# Patient Record
Sex: Male | Born: 1935 | Race: White | Hispanic: No | Smoking: Former smoker
Health system: Southern US, Community
[De-identification: ages and names within clinical notes are randomized; demographics above are authoritative.]

## PROBLEM LIST (undated history)

## (undated) DIAGNOSIS — D696 Thrombocytopenia, unspecified: Secondary | ICD-10-CM

## (undated) DIAGNOSIS — J1282 Pneumonia due to coronavirus disease 2019: Secondary | ICD-10-CM

## (undated) DIAGNOSIS — I2510 Atherosclerotic heart disease of native coronary artery without angina pectoris: Secondary | ICD-10-CM

## (undated) DIAGNOSIS — I10 Essential (primary) hypertension: Secondary | ICD-10-CM

## (undated) DIAGNOSIS — I2129 ST elevation (STEMI) myocardial infarction involving other sites: Secondary | ICD-10-CM

## (undated) DIAGNOSIS — L719 Rosacea, unspecified: Secondary | ICD-10-CM

## (undated) DIAGNOSIS — N183 Chronic kidney disease, stage 3 unspecified: Secondary | ICD-10-CM

## (undated) DIAGNOSIS — R296 Repeated falls: Secondary | ICD-10-CM

## (undated) DIAGNOSIS — E782 Mixed hyperlipidemia: Secondary | ICD-10-CM

## (undated) DIAGNOSIS — N200 Calculus of kidney: Secondary | ICD-10-CM

## (undated) DIAGNOSIS — U071 COVID-19: Secondary | ICD-10-CM

## (undated) DIAGNOSIS — G4733 Obstructive sleep apnea (adult) (pediatric): Secondary | ICD-10-CM

## (undated) DIAGNOSIS — I77810 Thoracic aortic ectasia: Secondary | ICD-10-CM

## (undated) DIAGNOSIS — I255 Ischemic cardiomyopathy: Secondary | ICD-10-CM

## (undated) HISTORY — PX: TOTAL KNEE ARTHROPLASTY: SHX125

## (undated) HISTORY — DX: Obstructive sleep apnea (adult) (pediatric): G47.33

## (undated) HISTORY — DX: Pneumonia due to coronavirus disease 2019: J12.82

## (undated) HISTORY — DX: Ischemic cardiomyopathy: I25.5

## (undated) HISTORY — DX: Thoracic aortic ectasia: I77.810

## (undated) HISTORY — DX: Chronic kidney disease, stage 3 unspecified: N18.30

## (undated) HISTORY — PX: MELANOMA EXCISION: SHX5266

## (undated) HISTORY — DX: Calculus of kidney: N20.0

## (undated) HISTORY — DX: Essential (primary) hypertension: I10

## (undated) HISTORY — DX: Thrombocytopenia, unspecified: D69.6

## (undated) HISTORY — DX: COVID-19: U07.1

## (undated) HISTORY — DX: Mixed hyperlipidemia: E78.2

## (undated) HISTORY — DX: Chronic kidney disease, stage 3 (moderate): N18.3

## (undated) HISTORY — PX: CHOLECYSTECTOMY: SHX55

## (undated) HISTORY — DX: Atherosclerotic heart disease of native coronary artery without angina pectoris: I25.10

## (undated) HISTORY — PX: OTHER SURGICAL HISTORY: SHX169

## (undated) HISTORY — PX: HERNIA REPAIR: SHX51

## (undated) HISTORY — DX: Rosacea, unspecified: L71.9

---

## 1990-07-05 HISTORY — PX: CORONARY ARTERY BYPASS GRAFT: SHX141

## 1990-07-11 HISTORY — PX: CARDIAC CATHETERIZATION: SHX172

## 2001-10-24 ENCOUNTER — Ambulatory Visit: Admission: RE | Admit: 2001-10-24 | Discharge: 2001-10-24 | Payer: Self-pay | Admitting: *Deleted

## 2001-12-26 ENCOUNTER — Ambulatory Visit: Admission: RE | Admit: 2001-12-26 | Discharge: 2001-12-26 | Payer: Self-pay | Admitting: Pulmonary Disease

## 2006-01-24 ENCOUNTER — Ambulatory Visit (HOSPITAL_COMMUNITY): Admission: RE | Admit: 2006-01-24 | Discharge: 2006-01-24 | Payer: Self-pay | Admitting: General Surgery

## 2006-02-09 ENCOUNTER — Encounter (INDEPENDENT_AMBULATORY_CARE_PROVIDER_SITE_OTHER): Payer: Self-pay | Admitting: Specialist

## 2006-02-09 ENCOUNTER — Inpatient Hospital Stay (HOSPITAL_COMMUNITY): Admission: RE | Admit: 2006-02-09 | Discharge: 2006-02-13 | Payer: Self-pay | Admitting: Otolaryngology

## 2006-02-28 ENCOUNTER — Ambulatory Visit: Payer: Self-pay | Admitting: Oncology

## 2006-03-03 LAB — CBC WITH DIFFERENTIAL/PLATELET
BASO%: 0.4 % (ref 0.0–2.0)
Basophils Absolute: 0 10*3/uL (ref 0.0–0.1)
EOS%: 3.7 % (ref 0.0–7.0)
Eosinophils Absolute: 0.3 10*3/uL (ref 0.0–0.5)
HCT: 46.1 % (ref 38.7–49.9)
HGB: 16.4 g/dL (ref 13.0–17.1)
LYMPH%: 36.2 % (ref 14.0–48.0)
MCH: 32.2 pg (ref 28.0–33.4)
MCHC: 35.7 g/dL (ref 32.0–35.9)
MCV: 90.3 fL (ref 81.6–98.0)
MONO#: 0.4 10*3/uL (ref 0.1–0.9)
MONO%: 6.1 % (ref 0.0–13.0)
NEUT#: 3.7 10*3/uL (ref 1.5–6.5)
NEUT%: 53.6 % (ref 40.0–75.0)
Platelets: 143 10*3/uL — ABNORMAL LOW (ref 145–400)
RBC: 5.11 10*6/uL (ref 4.20–5.71)
RDW: 12.7 % (ref 11.2–14.6)
WBC: 6.9 10*3/uL (ref 4.0–10.0)
lymph#: 2.5 10*3/uL (ref 0.9–3.3)

## 2006-03-03 LAB — COMPREHENSIVE METABOLIC PANEL
ALT: 22 U/L (ref 0–40)
AST: 20 U/L (ref 0–37)
Albumin: 4.5 g/dL (ref 3.5–5.2)
Alkaline Phosphatase: 41 U/L (ref 39–117)
BUN: 18 mg/dL (ref 6–23)
CO2: 24 mEq/L (ref 19–32)
Calcium: 9.5 mg/dL (ref 8.4–10.5)
Chloride: 106 mEq/L (ref 96–112)
Creatinine, Ser: 1.28 mg/dL (ref 0.40–1.50)
Glucose, Bld: 97 mg/dL (ref 70–99)
Potassium: 4 mEq/L (ref 3.5–5.3)
Sodium: 140 mEq/L (ref 135–145)
Total Bilirubin: 2 mg/dL — ABNORMAL HIGH (ref 0.3–1.2)
Total Protein: 7.1 g/dL (ref 6.0–8.3)

## 2006-03-03 LAB — LACTATE DEHYDROGENASE: LDH: 166 U/L (ref 94–250)

## 2006-09-05 ENCOUNTER — Ambulatory Visit: Payer: Self-pay | Admitting: Oncology

## 2006-09-07 ENCOUNTER — Ambulatory Visit (HOSPITAL_COMMUNITY): Admission: RE | Admit: 2006-09-07 | Discharge: 2006-09-07 | Payer: Self-pay | Admitting: Oncology

## 2006-09-07 LAB — COMPREHENSIVE METABOLIC PANEL
ALT: 31 U/L (ref 0–53)
AST: 29 U/L (ref 0–37)
Albumin: 4.5 g/dL (ref 3.5–5.2)
Alkaline Phosphatase: 46 U/L (ref 39–117)
BUN: 18 mg/dL (ref 6–23)
CO2: 25 mEq/L (ref 19–32)
Calcium: 9.8 mg/dL (ref 8.4–10.5)
Chloride: 105 mEq/L (ref 96–112)
Creatinine, Ser: 1.13 mg/dL (ref 0.40–1.50)
Glucose, Bld: 90 mg/dL (ref 70–99)
Potassium: 3.9 mEq/L (ref 3.5–5.3)
Sodium: 141 mEq/L (ref 135–145)
Total Bilirubin: 1.7 mg/dL — ABNORMAL HIGH (ref 0.3–1.2)
Total Protein: 7.4 g/dL (ref 6.0–8.3)

## 2006-09-07 LAB — CBC WITH DIFFERENTIAL/PLATELET
BASO%: 0.3 % (ref 0.0–2.0)
Basophils Absolute: 0 10*3/uL (ref 0.0–0.1)
EOS%: 3.1 % (ref 0.0–7.0)
Eosinophils Absolute: 0.2 10*3/uL (ref 0.0–0.5)
HCT: 46.4 % (ref 38.7–49.9)
HGB: 16.8 g/dL (ref 13.0–17.1)
LYMPH%: 28.6 % (ref 14.0–48.0)
MCH: 31.3 pg (ref 28.0–33.4)
MCHC: 36.2 g/dL — ABNORMAL HIGH (ref 32.0–35.9)
MCV: 86.7 fL (ref 81.6–98.0)
MONO#: 0.5 10*3/uL (ref 0.1–0.9)
MONO%: 7 % (ref 0.0–13.0)
NEUT#: 4.2 10*3/uL (ref 1.5–6.5)
NEUT%: 61 % (ref 40.0–75.0)
Platelets: 181 10*3/uL (ref 145–400)
RBC: 5.35 10*6/uL (ref 4.20–5.71)
RDW: 12.8 % (ref 11.2–14.6)
WBC: 7 10*3/uL (ref 4.0–10.0)
lymph#: 2 10*3/uL (ref 0.9–3.3)

## 2006-09-07 LAB — LACTATE DEHYDROGENASE: LDH: 173 U/L (ref 94–250)

## 2007-01-10 ENCOUNTER — Ambulatory Visit: Payer: Self-pay | Admitting: Oncology

## 2007-01-12 LAB — COMPREHENSIVE METABOLIC PANEL
ALT: 25 U/L (ref 0–53)
AST: 23 U/L (ref 0–37)
Albumin: 4.4 g/dL (ref 3.5–5.2)
Alkaline Phosphatase: 40 U/L (ref 39–117)
BUN: 16 mg/dL (ref 6–23)
CO2: 24 mEq/L (ref 19–32)
Calcium: 9.4 mg/dL (ref 8.4–10.5)
Chloride: 106 mEq/L (ref 96–112)
Creatinine, Ser: 1.25 mg/dL (ref 0.40–1.50)
Glucose, Bld: 96 mg/dL (ref 70–99)
Potassium: 3.9 mEq/L (ref 3.5–5.3)
Sodium: 140 mEq/L (ref 135–145)
Total Bilirubin: 2.6 mg/dL — ABNORMAL HIGH (ref 0.3–1.2)
Total Protein: 6.9 g/dL (ref 6.0–8.3)

## 2007-01-12 LAB — CBC WITH DIFFERENTIAL/PLATELET
BASO%: 0.5 % (ref 0.0–2.0)
Basophils Absolute: 0 10*3/uL (ref 0.0–0.1)
EOS%: 3.7 % (ref 0.0–7.0)
Eosinophils Absolute: 0.2 10*3/uL (ref 0.0–0.5)
HCT: UNDETERMINED % (ref 38.7–49.9)
HGB: 16.6 g/dL (ref 13.0–17.1)
LYMPH%: 32.4 % (ref 14.0–48.0)
MCH: UNDETERMINED pg (ref 28.0–33.4)
MCHC: UNDETERMINED g/dL (ref 32.0–35.9)
MCV: UNDETERMINED fL (ref 81.6–98.0)
MONO#: 0.3 10*3/uL (ref 0.1–0.9)
MONO%: 5.6 % (ref 0.0–13.0)
NEUT#: 3 10*3/uL (ref 1.5–6.5)
NEUT%: 57.8 % (ref 40.0–75.0)
Platelets: 120 10*3/uL — ABNORMAL LOW (ref 145–400)
RBC: UNDETERMINED 10*6/uL (ref 4.20–5.71)
RDW: 12.7 % (ref 11.2–14.6)
WBC: 5.2 10*3/uL (ref 4.0–10.0)
lymph#: 1.7 10*3/uL (ref 0.9–3.3)

## 2007-01-12 LAB — LACTATE DEHYDROGENASE: LDH: 151 U/L (ref 94–250)

## 2007-05-23 HISTORY — PX: DOPPLER ECHOCARDIOGRAPHY: SHX263

## 2007-07-11 ENCOUNTER — Ambulatory Visit: Payer: Self-pay | Admitting: Oncology

## 2007-07-13 ENCOUNTER — Ambulatory Visit (HOSPITAL_COMMUNITY): Admission: RE | Admit: 2007-07-13 | Discharge: 2007-07-13 | Payer: Self-pay | Admitting: Oncology

## 2007-07-13 LAB — COMPREHENSIVE METABOLIC PANEL
ALT: 22 U/L (ref 0–53)
AST: 20 U/L (ref 0–37)
Albumin: 4.4 g/dL (ref 3.5–5.2)
Alkaline Phosphatase: 46 U/L (ref 39–117)
BUN: 18 mg/dL (ref 6–23)
CO2: 26 mEq/L (ref 19–32)
Calcium: 9.4 mg/dL (ref 8.4–10.5)
Chloride: 105 mEq/L (ref 96–112)
Creatinine, Ser: 1.16 mg/dL (ref 0.40–1.50)
Glucose, Bld: 95 mg/dL (ref 70–99)
Potassium: 4.2 mEq/L (ref 3.5–5.3)
Sodium: 141 mEq/L (ref 135–145)
Total Bilirubin: 2.2 mg/dL — ABNORMAL HIGH (ref 0.3–1.2)
Total Protein: 7.3 g/dL (ref 6.0–8.3)

## 2007-07-13 LAB — CBC WITH DIFFERENTIAL/PLATELET
BASO%: 0.4 % (ref 0.0–2.0)
Basophils Absolute: 0 10*3/uL (ref 0.0–0.1)
EOS%: 4 % (ref 0.0–7.0)
Eosinophils Absolute: 0.3 10*3/uL (ref 0.0–0.5)
HCT: 46.8 % (ref 38.7–49.9)
HGB: 16.8 g/dL (ref 13.0–17.1)
LYMPH%: 34.9 % (ref 14.0–48.0)
MCH: 31.6 pg (ref 28.0–33.4)
MCHC: 35.8 g/dL (ref 32.0–35.9)
MCV: 88.4 fL (ref 81.6–98.0)
MONO#: 0.4 10*3/uL (ref 0.1–0.9)
MONO%: 6.4 % (ref 0.0–13.0)
NEUT#: 3.5 10*3/uL (ref 1.5–6.5)
NEUT%: 54.3 % (ref 40.0–75.0)
Platelets: 135 10*3/uL — ABNORMAL LOW (ref 145–400)
RBC: 5.3 10*6/uL (ref 4.20–5.71)
RDW: 13 % (ref 11.2–14.6)
WBC: 6.4 10*3/uL (ref 4.0–10.0)
lymph#: 2.2 10*3/uL (ref 0.9–3.3)

## 2007-07-13 LAB — LACTATE DEHYDROGENASE: LDH: 146 U/L (ref 94–250)

## 2007-12-11 ENCOUNTER — Ambulatory Visit: Payer: Self-pay | Admitting: Oncology

## 2007-12-13 LAB — COMPREHENSIVE METABOLIC PANEL
ALT: 19 U/L (ref 0–53)
AST: 22 U/L (ref 0–37)
Albumin: 4.4 g/dL (ref 3.5–5.2)
Alkaline Phosphatase: 45 U/L (ref 39–117)
BUN: 19 mg/dL (ref 6–23)
CO2: 24 mEq/L (ref 19–32)
Calcium: 9.1 mg/dL (ref 8.4–10.5)
Chloride: 105 mEq/L (ref 96–112)
Creatinine, Ser: 1.14 mg/dL (ref 0.40–1.50)
Glucose, Bld: 103 mg/dL — ABNORMAL HIGH (ref 70–99)
Potassium: 3.5 mEq/L (ref 3.5–5.3)
Sodium: 140 mEq/L (ref 135–145)
Total Bilirubin: 2.6 mg/dL — ABNORMAL HIGH (ref 0.3–1.2)
Total Protein: 7.1 g/dL (ref 6.0–8.3)

## 2007-12-13 LAB — CBC WITH DIFFERENTIAL/PLATELET
BASO%: 0.4 % (ref 0.0–2.0)
Basophils Absolute: 0 10*3/uL (ref 0.0–0.1)
EOS%: 2.2 % (ref 0.0–7.0)
Eosinophils Absolute: 0.1 10*3/uL (ref 0.0–0.5)
HCT: 47 % (ref 38.7–49.9)
HGB: 16.8 g/dL (ref 13.0–17.1)
LYMPH%: 40 % (ref 14.0–48.0)
MCH: 31.5 pg (ref 28.0–33.4)
MCHC: 35.7 g/dL (ref 32.0–35.9)
MCV: 88.2 fL (ref 81.6–98.0)
MONO#: 0.3 10*3/uL (ref 0.1–0.9)
MONO%: 4.9 % (ref 0.0–13.0)
NEUT#: 3 10*3/uL (ref 1.5–6.5)
NEUT%: 52.5 % (ref 40.0–75.0)
Platelets: 118 10*3/uL — ABNORMAL LOW (ref 145–400)
RBC: 5.33 10*6/uL (ref 4.20–5.71)
RDW: 12.8 % (ref 11.2–14.6)
WBC: 5.7 10*3/uL (ref 4.0–10.0)
lymph#: 2.3 10*3/uL (ref 0.9–3.3)

## 2007-12-13 LAB — LACTATE DEHYDROGENASE: LDH: 146 U/L (ref 94–250)

## 2008-05-15 ENCOUNTER — Ambulatory Visit (HOSPITAL_COMMUNITY): Admission: RE | Admit: 2008-05-15 | Discharge: 2008-05-15 | Payer: Self-pay | Admitting: Pulmonary Disease

## 2008-07-04 ENCOUNTER — Ambulatory Visit: Payer: Self-pay | Admitting: Oncology

## 2008-07-09 LAB — CBC WITH DIFFERENTIAL/PLATELET
BASO%: 0.3 % (ref 0.0–2.0)
Basophils Absolute: 0 10*3/uL (ref 0.0–0.1)
EOS%: 4.6 % (ref 0.0–7.0)
Eosinophils Absolute: 0.3 10*3/uL (ref 0.0–0.5)
HCT: 47.9 % (ref 38.7–49.9)
HGB: 16.9 g/dL (ref 13.0–17.1)
LYMPH%: 38.2 % (ref 14.0–48.0)
MCH: 31.4 pg (ref 28.0–33.4)
MCHC: 35.2 g/dL (ref 32.0–35.9)
MCV: 89.1 fL (ref 81.6–98.0)
MONO#: 0.4 10*3/uL (ref 0.1–0.9)
MONO%: 6.3 % (ref 0.0–13.0)
NEUT#: 2.9 10*3/uL (ref 1.5–6.5)
NEUT%: 50.6 % (ref 40.0–75.0)
Platelets: 115 10*3/uL — ABNORMAL LOW (ref 145–400)
RBC: 5.38 10*6/uL (ref 4.20–5.71)
RDW: 13 % (ref 11.2–14.6)
WBC: 5.7 10*3/uL (ref 4.0–10.0)
lymph#: 2.2 10*3/uL (ref 0.9–3.3)

## 2008-07-09 LAB — COMPREHENSIVE METABOLIC PANEL
ALT: 27 U/L (ref 0–53)
AST: 23 U/L (ref 0–37)
Albumin: 4.4 g/dL (ref 3.5–5.2)
Alkaline Phosphatase: 45 U/L (ref 39–117)
BUN: 16 mg/dL (ref 6–23)
CO2: 28 mEq/L (ref 19–32)
Calcium: 8.9 mg/dL (ref 8.4–10.5)
Chloride: 103 mEq/L (ref 96–112)
Creatinine, Ser: 1.18 mg/dL (ref 0.40–1.50)
Glucose, Bld: 90 mg/dL (ref 70–99)
Potassium: 3.8 mEq/L (ref 3.5–5.3)
Sodium: 140 mEq/L (ref 135–145)
Total Bilirubin: 2.5 mg/dL — ABNORMAL HIGH (ref 0.3–1.2)
Total Protein: 6.9 g/dL (ref 6.0–8.3)

## 2008-07-09 LAB — LACTATE DEHYDROGENASE: LDH: 155 U/L (ref 94–250)

## 2009-01-01 ENCOUNTER — Ambulatory Visit: Payer: Self-pay | Admitting: Oncology

## 2009-01-07 LAB — CBC WITH DIFFERENTIAL/PLATELET
BASO%: 0.3 % (ref 0.0–2.0)
Basophils Absolute: 0 10*3/uL (ref 0.0–0.1)
EOS%: 2.4 % (ref 0.0–7.0)
Eosinophils Absolute: 0.1 10*3/uL (ref 0.0–0.5)
HCT: 44.7 % (ref 38.4–49.9)
HGB: 15.8 g/dL (ref 13.0–17.1)
LYMPH%: 32.6 % (ref 14.0–49.0)
MCH: 31.4 pg (ref 27.2–33.4)
MCHC: 35.3 g/dL (ref 32.0–36.0)
MCV: 88.9 fL (ref 79.3–98.0)
MONO#: 0.4 10*3/uL (ref 0.1–0.9)
MONO%: 6.7 % (ref 0.0–14.0)
NEUT#: 3.3 10*3/uL (ref 1.5–6.5)
NEUT%: 58 % (ref 39.0–75.0)
Platelets: 108 10*3/uL — ABNORMAL LOW (ref 140–400)
RBC: 5.02 10*6/uL (ref 4.20–5.82)
RDW: 13.1 % (ref 11.0–14.6)
WBC: 5.6 10*3/uL (ref 4.0–10.3)
lymph#: 1.8 10*3/uL (ref 0.9–3.3)

## 2009-01-07 LAB — COMPREHENSIVE METABOLIC PANEL
ALT: 25 U/L (ref 0–53)
AST: 18 U/L (ref 0–37)
Albumin: 4.3 g/dL (ref 3.5–5.2)
Alkaline Phosphatase: 42 U/L (ref 39–117)
BUN: 15 mg/dL (ref 6–23)
CO2: 22 mEq/L (ref 19–32)
Calcium: 9.5 mg/dL (ref 8.4–10.5)
Chloride: 105 mEq/L (ref 96–112)
Creatinine, Ser: 1.07 mg/dL (ref 0.40–1.50)
Glucose, Bld: 102 mg/dL — ABNORMAL HIGH (ref 70–99)
Potassium: 4 mEq/L (ref 3.5–5.3)
Sodium: 139 mEq/L (ref 135–145)
Total Bilirubin: 1.9 mg/dL — ABNORMAL HIGH (ref 0.3–1.2)
Total Protein: 6.8 g/dL (ref 6.0–8.3)

## 2009-01-07 LAB — LACTATE DEHYDROGENASE: LDH: 143 U/L (ref 94–250)

## 2009-03-04 ENCOUNTER — Ambulatory Visit: Payer: Self-pay | Admitting: Orthopedic Surgery

## 2009-03-04 DIAGNOSIS — IMO0002 Reserved for concepts with insufficient information to code with codable children: Secondary | ICD-10-CM | POA: Insufficient documentation

## 2009-03-04 DIAGNOSIS — M1710 Unilateral primary osteoarthritis, unspecified knee: Secondary | ICD-10-CM

## 2009-03-05 ENCOUNTER — Telehealth: Payer: Self-pay | Admitting: Orthopedic Surgery

## 2009-04-01 HISTORY — PX: OTHER SURGICAL HISTORY: SHX169

## 2009-07-07 ENCOUNTER — Ambulatory Visit: Payer: Self-pay | Admitting: Oncology

## 2009-07-10 ENCOUNTER — Ambulatory Visit (HOSPITAL_COMMUNITY): Admission: RE | Admit: 2009-07-10 | Discharge: 2009-07-10 | Payer: Self-pay | Admitting: Oncology

## 2009-07-10 LAB — COMPREHENSIVE METABOLIC PANEL
ALT: 37 U/L (ref 0–53)
AST: 30 U/L (ref 0–37)
Albumin: 4.4 g/dL (ref 3.5–5.2)
Alkaline Phosphatase: 45 U/L (ref 39–117)
BUN: 17 mg/dL (ref 6–23)
CO2: 25 mEq/L (ref 19–32)
Calcium: 9.2 mg/dL (ref 8.4–10.5)
Chloride: 105 mEq/L (ref 96–112)
Creatinine, Ser: 1.13 mg/dL (ref 0.40–1.50)
Glucose, Bld: 109 mg/dL — ABNORMAL HIGH (ref 70–99)
Potassium: 3.6 mEq/L (ref 3.5–5.3)
Sodium: 141 mEq/L (ref 135–145)
Total Bilirubin: 2.1 mg/dL — ABNORMAL HIGH (ref 0.3–1.2)
Total Protein: 6.8 g/dL (ref 6.0–8.3)

## 2009-07-10 LAB — CBC WITH DIFFERENTIAL/PLATELET
BASO%: 0.3 % (ref 0.0–2.0)
Basophils Absolute: 0 10*3/uL (ref 0.0–0.1)
EOS%: 3.9 % (ref 0.0–7.0)
Eosinophils Absolute: 0.2 10*3/uL (ref 0.0–0.5)
HCT: 46.8 % (ref 38.4–49.9)
HGB: 16.6 g/dL (ref 13.0–17.1)
LYMPH%: 35.5 % (ref 14.0–49.0)
MCH: 32.3 pg (ref 27.2–33.4)
MCHC: 35.4 g/dL (ref 32.0–36.0)
MCV: 91.2 fL (ref 79.3–98.0)
MONO#: 0.4 10*3/uL (ref 0.1–0.9)
MONO%: 5.7 % (ref 0.0–14.0)
NEUT#: 3.5 10*3/uL (ref 1.5–6.5)
NEUT%: 54.6 % (ref 39.0–75.0)
Platelets: 120 10*3/uL — ABNORMAL LOW (ref 140–400)
RBC: 5.14 10*6/uL (ref 4.20–5.82)
RDW: 12.6 % (ref 11.0–14.6)
WBC: 6.3 10*3/uL (ref 4.0–10.3)
lymph#: 2.2 10*3/uL (ref 0.9–3.3)

## 2009-07-10 LAB — LACTATE DEHYDROGENASE: LDH: 166 U/L (ref 94–250)

## 2009-12-02 ENCOUNTER — Ambulatory Visit: Payer: Self-pay | Admitting: Oncology

## 2009-12-03 ENCOUNTER — Ambulatory Visit (HOSPITAL_COMMUNITY): Admission: RE | Admit: 2009-12-03 | Discharge: 2009-12-03 | Payer: Self-pay | Admitting: Oncology

## 2009-12-03 LAB — COMPREHENSIVE METABOLIC PANEL
ALT: 31 U/L (ref 0–53)
AST: 26 U/L (ref 0–37)
Albumin: 4.2 g/dL (ref 3.5–5.2)
Alkaline Phosphatase: 37 U/L — ABNORMAL LOW (ref 39–117)
BUN: 17 mg/dL (ref 6–23)
CO2: 24 mEq/L (ref 19–32)
Calcium: 9.3 mg/dL (ref 8.4–10.5)
Chloride: 105 mEq/L (ref 96–112)
Creatinine, Ser: 1.27 mg/dL (ref 0.40–1.50)
Glucose, Bld: 100 mg/dL — ABNORMAL HIGH (ref 70–99)
Potassium: 4 mEq/L (ref 3.5–5.3)
Sodium: 141 mEq/L (ref 135–145)
Total Bilirubin: 2.1 mg/dL — ABNORMAL HIGH (ref 0.3–1.2)
Total Protein: 6.4 g/dL (ref 6.0–8.3)

## 2009-12-03 LAB — CBC WITH DIFFERENTIAL/PLATELET
BASO%: 0.4 % (ref 0.0–2.0)
Basophils Absolute: 0 10*3/uL (ref 0.0–0.1)
EOS%: 5.4 % (ref 0.0–7.0)
Eosinophils Absolute: 0.3 10*3/uL (ref 0.0–0.5)
HCT: 44 % (ref 38.4–49.9)
HGB: 15.7 g/dL (ref 13.0–17.1)
LYMPH%: 37.8 % (ref 14.0–49.0)
MCH: 32.5 pg (ref 27.2–33.4)
MCHC: 35.8 g/dL (ref 32.0–36.0)
MCV: 90.8 fL (ref 79.3–98.0)
MONO#: 0.4 10*3/uL (ref 0.1–0.9)
MONO%: 6.3 % (ref 0.0–14.0)
NEUT#: 3 10*3/uL (ref 1.5–6.5)
NEUT%: 50.1 % (ref 39.0–75.0)
Platelets: 103 10*3/uL — ABNORMAL LOW (ref 140–400)
RBC: 4.84 10*6/uL (ref 4.20–5.82)
RDW: 12.9 % (ref 11.0–14.6)
WBC: 6 10*3/uL (ref 4.0–10.3)
lymph#: 2.3 10*3/uL (ref 0.9–3.3)

## 2009-12-03 LAB — LACTATE DEHYDROGENASE: LDH: 148 U/L (ref 94–250)

## 2009-12-08 ENCOUNTER — Inpatient Hospital Stay (HOSPITAL_COMMUNITY): Admission: RE | Admit: 2009-12-08 | Discharge: 2009-12-11 | Payer: Self-pay | Admitting: Orthopedic Surgery

## 2010-01-26 ENCOUNTER — Emergency Department (HOSPITAL_COMMUNITY): Admission: EM | Admit: 2010-01-26 | Discharge: 2010-01-26 | Payer: Self-pay | Admitting: Emergency Medicine

## 2010-01-29 ENCOUNTER — Ambulatory Visit (HOSPITAL_COMMUNITY): Payer: Self-pay | Admitting: Pulmonary Disease

## 2010-01-29 ENCOUNTER — Encounter (HOSPITAL_COMMUNITY)
Admission: RE | Admit: 2010-01-29 | Discharge: 2010-02-28 | Payer: Self-pay | Source: Home / Self Care | Admitting: Pulmonary Disease

## 2010-06-08 ENCOUNTER — Ambulatory Visit: Payer: Self-pay | Admitting: Oncology

## 2010-06-10 LAB — COMPREHENSIVE METABOLIC PANEL
ALT: 24 U/L (ref 0–53)
AST: 23 U/L (ref 0–37)
Albumin: 4.3 g/dL (ref 3.5–5.2)
Alkaline Phosphatase: 44 U/L (ref 39–117)
BUN: 22 mg/dL (ref 6–23)
CO2: 27 mEq/L (ref 19–32)
Calcium: 9.6 mg/dL (ref 8.4–10.5)
Chloride: 106 mEq/L (ref 96–112)
Creatinine, Ser: 1.17 mg/dL (ref 0.40–1.50)
Glucose, Bld: 95 mg/dL (ref 70–99)
Potassium: 3.9 mEq/L (ref 3.5–5.3)
Sodium: 141 mEq/L (ref 135–145)
Total Bilirubin: 2.2 mg/dL — ABNORMAL HIGH (ref 0.3–1.2)
Total Protein: 6.7 g/dL (ref 6.0–8.3)

## 2010-06-10 LAB — CBC WITH DIFFERENTIAL/PLATELET
BASO%: 0.6 % (ref 0.0–2.0)
Basophils Absolute: 0 10*3/uL (ref 0.0–0.1)
EOS%: 3.8 % (ref 0.0–7.0)
Eosinophils Absolute: 0.2 10*3/uL (ref 0.0–0.5)
HCT: 44.9 % (ref 38.4–49.9)
HGB: 16.1 g/dL (ref 13.0–17.1)
LYMPH%: 40.6 % (ref 14.0–49.0)
MCH: 32.4 pg (ref 27.2–33.4)
MCHC: 35.8 g/dL (ref 32.0–36.0)
MCV: 90.5 fL (ref 79.3–98.0)
MONO#: 0.4 10*3/uL (ref 0.1–0.9)
MONO%: 6.4 % (ref 0.0–14.0)
NEUT#: 2.8 10*3/uL (ref 1.5–6.5)
NEUT%: 48.6 % (ref 39.0–75.0)
Platelets: 95 10*3/uL — ABNORMAL LOW (ref 140–400)
RBC: 4.97 10*6/uL (ref 4.20–5.82)
RDW: 13.9 % (ref 11.0–14.6)
WBC: 5.7 10*3/uL (ref 4.0–10.3)
lymph#: 2.3 10*3/uL (ref 0.9–3.3)

## 2010-06-10 LAB — LACTATE DEHYDROGENASE: LDH: 154 U/L (ref 94–250)

## 2010-06-30 ENCOUNTER — Encounter (INDEPENDENT_AMBULATORY_CARE_PROVIDER_SITE_OTHER): Payer: Self-pay | Admitting: General Surgery

## 2010-06-30 ENCOUNTER — Ambulatory Visit (HOSPITAL_COMMUNITY)
Admission: RE | Admit: 2010-06-30 | Discharge: 2010-07-01 | Payer: Self-pay | Source: Home / Self Care | Attending: General Surgery | Admitting: General Surgery

## 2010-08-04 NOTE — Letter (Signed)
Summary: History form  History form   Imported By: Jacklynn Ganong 03/05/2009 16:01:55  _____________________________________________________________________  External Attachment:    Type:   Image     Comment:   External Document

## 2010-08-04 NOTE — Assessment & Plan Note (Signed)
Summary: LEFT KNEE PAIN NEEDS XR/[facility]   Vital Signs:  Patient profile:   77 year old male Weight:      202 pounds Pulse rate:   78 / minute Resp:     18 per minute  Vitals Entered By: Fuller Canada MD (March 04, 2009 3:08 PM)  Visit Type:  Initial Consult Referring Provider:  Dr. Juanetta Gosling Primary Provider:  Dr. Juanetta Gosling   CC:  left knee pain.  History of Present Illness: I saw John [name] in the office today for an initial visit.  He is a 77 years old man with the complaint of:  chief complaint:  left knee pain, no radiation, Consult [facility].  pain -duration a year , pain and stiffness with popping. No catching or locking. -location in joint -severity [1-10] 10 sometimes -worsened by: walking down incline -improved by: rest, advil.  Had cortisone shot 2 months ago, no relief, no PT or HEP. -other symptoms -xrays done & where: today in our office.   Allergies (verified): No Known Drug Allergies  Past History:  Past Medical History: htn CAD tachycardia restless leg syndrome  Past Surgical History: bypass 1992 skin cancer gallbladder  Family History: na  Social History: retired married no smoking no alcohol 1 cup of coffee per day  Review of Systems General:  Denies weight loss, weight gain, fever, chills, and fatigue. Cardiac :  Denies chest pain, angina, heart attack, heart failure, poor circulation, blood clots, and phlebitis. Resp:  Denies short of breath, difficulty breathing, COPD, cough, and pneumonia. GI:  Denies nausea, vomiting, diarrhea, constipation, difficulty swallowing, ulcers, GERD, and reflux. GU:  Complains of kidney stones; denies kidney failure, kidney transplant, burning, poor stream, testicular cancer, blood in urine, and . Neuro:  Denies headache, dizziness, migraines, numbness, weakness, tremor, and unsteady walking. MS:  Complains of joint pain; denies rheumatoid arthritis, joint swelling, gout, bone cancer,  osteoporosis, and . Endo:  Denies thyroid disease, goiter, and diabetes. Psych:  Denies depression, mood swings, anxiety, panic attack, bipolar, and schizophrenia. Derm:  Complains of cancer; denies eczema and itching. EENT:  Complains of poor hearing; denies poor vision, cataracts, glaucoma, vertigo, ears ringing, sinusitis, hoarseness, toothaches, and bleeding gums. Immunology:  Denies seasonal allergies, sinus problems, and allergic to bee stings. Lymphatic:  Denies lymph node cancer and lymph edema.  Physical Exam  Additional Exam:  GEN: well developed and nourished. normal body habitus, grooming and hygiene CDV: normal pulses perfusion color and temperature without swelling or edema Lymph: normal lymph system SKIN: normal without lesions, masses, nodules NEURO/PSYCH: Normal coordination, reflexes and sensation. awake alert and oriented  ZOX:WRUE knee  Inspection revealed tenderness over the medial compartment there is also a significant  Motor exam is normal.  Knee is stable.  There is varus deformity.  White knee evaluation form range of motion with no instability no effusion.      Impression & Recommendations:  Problem # 1:  KNEE, ARTHRITIS, DEGEN./OSTEO (ICD-715.96)  The views of the knee  The joint spaces are abnormal and significant medial joint space narrowing with bone to bone contact; and there is varus deformity   impression varus osteoarthritis  aspiration of 45 cc of clear yellow fluid.  Then injected LEFT knee.    Verbal consent was obtained. The knee was prepped with alcohol and ethyl chloride. 1 cc of depomedrol 40mg /cc and 4 cc of lidocaine 1% was injected. there were no complications.  Orders: New Patient Level IV (45409) Depo- Medrol 40mg  (J1030) Joint  Aspirate / Injection, Large (20610) Knee x-ray,  3 views (63016)  Medications Added to Medication List This Visit: 1)  Amlodipine Besy-benazepril Hcl 5-10 Mg Caps (Amlodipine besy-benazepril hcl)  2)  Metoprolol Tartrate 25 Mg Tabs (Metoprolol tartrate) 3)  Aspir-low 81 Mg Tbec (Aspirin) 4)  Ropinirole Hcl 0.25 Mg Tabs (Ropinirole hcl)  Patient Instructions: 1)  You have received an injection of cortisone today. You may experience increased pain at the injection site. Apply ice pack to the area for 20 minutes every 2 hours and take 2 xtra strength tylenol every 8 hours. This increased pain will usually resolve in 24 hours. The injection will take effect in 3-10 days.  2)  See how the shot does if not better after a month come back to discuss Knee repalcement

## 2010-08-04 NOTE — Progress Notes (Signed)
Summary: will injection cause increased heart rate?  Phone Note Call from Patient   Summary of Call: John [name] ([birth year]) wanted to know if the injection he received yesterday would cause his heart rate  to increase.  Said his increased some last night about 10:00  but was better today. Advised him to call his cardiologist and let them know about this, but I would also let you know. his # [phone] Initial call taken by: Jacklynn Ganong,  March 05, 2009 12:17 PM

## 2010-08-10 NOTE — Discharge Summary (Signed)
  John [name], John [name] NO.:  [id number]  MEDICAL RECORD NO.:  [id number]          PATIENT TYPE:  OIB  LOCATION:  A331                          FACILITY:  [facility]  PHYSICIAN:  Barbaraann Barthel, M.D. DATE OF BIRTH:  [birth year]  DATE OF ADMISSION:  06/30/2010 DATE OF DISCHARGE:  12/28/2011LH                              DISCHARGE SUMMARY   PROCEDURE:  Right inguinal hernia repair without mesh on June 30, 2010.  SECONDARY DIAGNOSES: 1. Coronary artery disease. 2. Hypertension.  Note, this is a 77 year old white male who underwent an elective repair of right inguinal hernia.  This was done after cardiac clearance.  The procedure was done on June 30, 2010, uneventfully.  I did leave a catheter in him because this was done under spinal anesthesia.  This was removed in approximately 12 hours at which time, he was able to void without any problems.  His wound was clean with minimal ecchymosis and swelling.  He had no shortness of breath, leg pain, or any signs of any infection.  He was discharged after a period of observation after his surgery on the first postoperative day.     Barbaraann Barthel, M.D.     WB/MEDQ  D:  07/01/2010  T:  07/02/2010  Job:  [id number]  cc:   Dr. Gery Pray  Electronically Signed by Barbaraann Barthel M.D. on 08/10/2010 11:08:08 AM

## 2010-09-07 ENCOUNTER — Other Ambulatory Visit (HOSPITAL_COMMUNITY): Payer: Self-pay | Admitting: General Surgery

## 2010-09-07 DIAGNOSIS — N5089 Other specified disorders of the male genital organs: Secondary | ICD-10-CM

## 2010-09-08 ENCOUNTER — Ambulatory Visit (HOSPITAL_COMMUNITY)
Admission: RE | Admit: 2010-09-08 | Discharge: 2010-09-08 | Disposition: A | Discharge status: Home / Self Care | Payer: Medicare Other | Source: Ambulatory Visit | Attending: General Surgery | Admitting: General Surgery

## 2010-09-08 ENCOUNTER — Other Ambulatory Visit (HOSPITAL_COMMUNITY): Payer: Self-pay | Admitting: General Surgery

## 2010-09-08 DIAGNOSIS — N433 Hydrocele, unspecified: Secondary | ICD-10-CM | POA: Insufficient documentation

## 2010-09-08 DIAGNOSIS — N508 Other specified disorders of male genital organs: Secondary | ICD-10-CM | POA: Insufficient documentation

## 2010-09-08 DIAGNOSIS — N5089 Other specified disorders of the male genital organs: Secondary | ICD-10-CM

## 2010-09-14 LAB — CBC
HCT: 47.3 % (ref 39.0–52.0)
Hemoglobin: 17.4 g/dL — ABNORMAL HIGH (ref 13.0–17.0)
MCH: 32.2 pg (ref 26.0–34.0)
MCHC: 36.8 g/dL — ABNORMAL HIGH (ref 30.0–36.0)
MCV: 87.4 fL (ref 78.0–100.0)
Platelets: 93 10*3/uL — ABNORMAL LOW (ref 150–400)
RBC: 5.41 MIL/uL (ref 4.22–5.81)
RDW: 13.1 % (ref 11.5–15.5)
WBC: 5.5 10*3/uL (ref 4.0–10.5)

## 2010-09-14 LAB — BASIC METABOLIC PANEL
BUN: 17 mg/dL (ref 6–23)
CO2: 26 mEq/L (ref 19–32)
Calcium: 9.2 mg/dL (ref 8.4–10.5)
Chloride: 103 mEq/L (ref 96–112)
Creatinine, Ser: 1.15 mg/dL (ref 0.4–1.5)
GFR calc Af Amer: >60 mL/min (ref >60)
GFR calc non Af Amer: >60 mL/min (ref >60)
Glucose, Bld: 100 mg/dL — ABNORMAL HIGH (ref 70–99)
Potassium: 4 mEq/L (ref 3.5–5.1)
Sodium: 138 mEq/L (ref 135–145)

## 2010-09-14 LAB — SURGICAL PCR SCREEN
MRSA, PCR: NEGATIVE
Staphylococcus aureus: NEGATIVE

## 2010-09-14 LAB — DIFFERENTIAL
Basophils Absolute: 0 10*3/uL (ref 0.0–0.1)
Basophils Relative: 0 % (ref 0–1)
Eosinophils Absolute: 0.2 10*3/uL (ref 0.0–0.7)
Eosinophils Relative: 3 % (ref 0–5)
Lymphocytes Relative: 39 % (ref 12–46)
Lymphs Abs: 2.1 10*3/uL (ref 0.7–4.0)
Monocytes Absolute: 0.3 10*3/uL (ref 0.1–1.0)
Monocytes Relative: 5 % (ref 3–12)
Neutro Abs: 2.9 10*3/uL (ref 1.7–7.7)
Neutrophils Relative %: 53 % (ref 43–77)

## 2010-09-21 LAB — PROTIME-INR
INR: 1.3 (ref 0.00–1.49)
INR: 1.3 (ref 0.00–1.49)
INR: 1.52 — ABNORMAL HIGH (ref 0.00–1.49)
INR: 1.16 (ref 0.00–1.49)
Prothrombin Time: 16.1 seconds — ABNORMAL HIGH (ref 11.6–15.2)
Prothrombin Time: 16.1 seconds — ABNORMAL HIGH (ref 11.6–15.2)
Prothrombin Time: 18.2 seconds — ABNORMAL HIGH (ref 11.6–15.2)
Prothrombin Time: 14.7 seconds (ref 11.6–15.2)

## 2010-09-21 LAB — CBC
HCT: 29.3 % — ABNORMAL LOW (ref 39.0–52.0)
HCT: 31.7 % — ABNORMAL LOW (ref 39.0–52.0)
HCT: 34.4 % — ABNORMAL LOW (ref 39.0–52.0)
HCT: 45 % (ref 39.0–52.0)
Hemoglobin: 10.3 g/dL — ABNORMAL LOW (ref 13.0–17.0)
Hemoglobin: 11.2 g/dL — ABNORMAL LOW (ref 13.0–17.0)
Hemoglobin: 12.1 g/dL — ABNORMAL LOW (ref 13.0–17.0)
Hemoglobin: 15.7 g/dL (ref 13.0–17.0)
MCHC: 35 g/dL (ref 30.0–36.0)
MCHC: 35.1 g/dL (ref 30.0–36.0)
MCHC: 35.2 g/dL (ref 30.0–36.0)
MCHC: 34.9 g/dL (ref 30.0–36.0)
MCV: 91.8 fL (ref 78.0–100.0)
MCV: 92.1 fL (ref 78.0–100.0)
MCV: 92.8 fL (ref 78.0–100.0)
MCV: 92.3 fL (ref 78.0–100.0)
Platelets: 93 10*3/uL — ABNORMAL LOW (ref 150–400)
Platelets: 95 10*3/uL — ABNORMAL LOW (ref 150–400)
Platelets: 97 10*3/uL — ABNORMAL LOW (ref 150–400)
Platelets: 100 10*3/uL — ABNORMAL LOW (ref 150–400)
RBC: 3.15 MIL/uL — ABNORMAL LOW (ref 4.22–5.81)
RBC: 3.46 MIL/uL — ABNORMAL LOW (ref 4.22–5.81)
RBC: 3.73 MIL/uL — ABNORMAL LOW (ref 4.22–5.81)
RBC: 4.87 MIL/uL (ref 4.22–5.81)
RDW: 13.3 % (ref 11.5–15.5)
RDW: 13.4 % (ref 11.5–15.5)
RDW: 13.5 % (ref 11.5–15.5)
RDW: 13 % (ref 11.5–15.5)
WBC: 6.7 10*3/uL (ref 4.0–10.5)
WBC: 7.6 10*3/uL (ref 4.0–10.5)
WBC: 8.1 10*3/uL (ref 4.0–10.5)
WBC: 5.6 10*3/uL (ref 4.0–10.5)

## 2010-09-21 LAB — COMPREHENSIVE METABOLIC PANEL
ALT: 37 U/L (ref 0–53)
AST: 31 U/L (ref 0–37)
Albumin: 4 g/dL (ref 3.5–5.2)
Alkaline Phosphatase: 37 U/L — ABNORMAL LOW (ref 39–117)
BUN: 12 mg/dL (ref 6–23)
CO2: 27 mEq/L (ref 19–32)
Calcium: 9.5 mg/dL (ref 8.4–10.5)
Chloride: 108 mEq/L (ref 96–112)
Creatinine, Ser: 1.19 mg/dL (ref 0.4–1.5)
GFR calc Af Amer: >60 mL/min (ref >60)
GFR calc non Af Amer: 59 mL/min — ABNORMAL LOW (ref >60)
Glucose, Bld: 105 mg/dL — ABNORMAL HIGH (ref 70–99)
Potassium: 3.6 mEq/L (ref 3.5–5.1)
Sodium: 142 mEq/L (ref 135–145)
Total Bilirubin: 2.5 mg/dL — ABNORMAL HIGH (ref 0.3–1.2)
Total Protein: 6.9 g/dL (ref 6.0–8.3)

## 2010-09-21 LAB — URINALYSIS, ROUTINE W REFLEX MICROSCOPIC
Bilirubin Urine: NEGATIVE
Glucose, UA: NEGATIVE mg/dL
Hgb urine dipstick: NEGATIVE
Ketones, ur: NEGATIVE mg/dL
Nitrite: NEGATIVE
Protein, ur: NEGATIVE mg/dL
Specific Gravity, Urine: 1.017 (ref 1.005–1.030)
Urobilinogen, UA: 0.2 mg/dL (ref 0.0–1.0)
pH: 6 (ref 5.0–8.0)

## 2010-09-21 LAB — BASIC METABOLIC PANEL
BUN: 12 mg/dL (ref 6–23)
BUN: 12 mg/dL (ref 6–23)
CO2: 26 mEq/L (ref 19–32)
CO2: 29 mEq/L (ref 19–32)
Calcium: 8.1 mg/dL — ABNORMAL LOW (ref 8.4–10.5)
Calcium: 8.3 mg/dL — ABNORMAL LOW (ref 8.4–10.5)
Chloride: 104 mEq/L (ref 96–112)
Chloride: 105 mEq/L (ref 96–112)
Creatinine, Ser: 1.16 mg/dL (ref 0.4–1.5)
Creatinine, Ser: 1.16 mg/dL (ref 0.4–1.5)
GFR calc Af Amer: >60 mL/min (ref >60)
GFR calc Af Amer: >60 mL/min (ref >60)
GFR calc non Af Amer: >60 mL/min (ref >60)
GFR calc non Af Amer: >60 mL/min (ref >60)
Glucose, Bld: 115 mg/dL — ABNORMAL HIGH (ref 70–99)
Glucose, Bld: 157 mg/dL — ABNORMAL HIGH (ref 70–99)
Potassium: 3.7 mEq/L (ref 3.5–5.1)
Potassium: 3.7 mEq/L (ref 3.5–5.1)
Sodium: 136 mEq/L (ref 135–145)
Sodium: 138 mEq/L (ref 135–145)

## 2010-09-21 LAB — TYPE AND SCREEN
ABO/RH(D): O POS
Antibody Screen: NEGATIVE

## 2010-09-21 LAB — ABO/RH: ABO/RH(D): O POS

## 2010-09-21 LAB — APTT: aPTT: 29 seconds (ref 24–37)

## 2010-11-20 NOTE — Op Note (Signed)
NAMECLAUDIS, GIOVANELLI              ACCOUNT NO.:  [id number]   MEDICAL RECORD NO.:  [id number]           PATIENT TYPE:   LOCATION:                                 FACILITY:   PHYSICIAN:  Jefry H. Pollyann Kennedy, MD          DATE OF BIRTH:   DATE OF PROCEDURE:  02/09/2006  DATE OF DISCHARGE:                                 OPERATIVE REPORT   PREOP DIAGNOSIS:  Malignant melanoma right neck skin.   PREOP DIAGNOSIS:  Malignant melanoma right neck skin.   PROCEDURES:  1. Re-excision of previous biopsy site scar.  2. Modified neck dissection including levels 2, 3, 4, the inferior aspect      of level 5 (supraclavicular nodes) and the retroauricular nodes all on      the right side.   ANESTHESIA:  General endotracheal anesthesia was used.   COMPLICATIONS:  None.   BLOOD LOSS:  Minimal.   FINDINGS:  Several nodes were identified, particularly in level 2; none  obviously abnormal.   REFERRING PHYSICIAN:  Angelia Mould. Derrell Lolling, M.D.   HISTORY:  A 77 year old gentleman recently had a melanoma excised.  It was  at least 1.2 mm in depth with a positive margin and on lymphoscintigram  there was activity in the retroauricular node, in the level 2 jugular node,  and the supraclavicular node.  Risks, benefits, alternatives, and  complications of procedure were explained to the patient who seemed to  understand and agreed to surgery.   DESCRIPTION OF PROCEDURE:  The patient was taken to the operating room and  placed on the operating table in the supine position.  Following induction  of general endotracheal anesthesia the neck was prepped and draped in a  standard fashion.   1. RE-EXCISION OF MELANOMA BIOPSY SITE:  A marking pen was used to outline  an ellipse surrounding the scar, 1 cm in all directions.  It was  incorporated into a modified Schobinger incision with extension up into the  postauricular skin and down to the clavicle.  Electrocautery was used to  incise the skin around the ellipse;  and to remove the full-thickness of skin  to the subcutaneous tissue in its entirety.  This was labeled with a long  suture anterior and short suture inferior; and sent for separate pathologic  evaluation.   2. MODIFIED NECK DISSECTION:  The incision was completed using  electrocautery and a subplatysmal flap was developed superiorly to the angle  of the mandible and inferiorly to the clavicle.  The flaps were extended  superiorly up to the post auricular fatty tissue and posteriorly to the  trapezius muscle.  The retroauricular nodal tissue was dissected out using  electrocautery.  There no palpable nodes.  This was sent separate and  labeled with a suture as retroauricular nodes.  The neck dissection was then  completed, dissecting levels 2 through 4 with partial level 5 inferiorly in  the supraclavicular nodes.  The posterior limit of dissection was the  trapezius muscle and the cutaneous branches of the of the cervical plexus,  the superior aspect  is very limited dissection; with the digastric muscle  anterosuperior with the submandibular gland; anterior was the strap muscles;  and inferior was the clavicle.  The sternocleidomastoid muscle, the vagus  nerve, and jugular vein as well as the carotid system were all preserved.   The hypoglossal nerve and spinal accessory nerve were preserved as well.  The posterior superior dissection was carried down beyond the accessory  nerve down to the fascia of the splenius capitis and levator scapular  muscles.  All fibrofatty tissue was dissected from these zones and sent  together as one specimen for pathologic evaluation with markings as level 2,  3, and 4 using sutures.  Silk ties were used as needed for hemostasis.  The  wound was irrigated with saline and positive pressure ventilation was  applied to assess any bleeding or any lymphatic drainage and there was none  identified.  The wound was closed in two layers using 3-0 chromic at the   platysma layer and skin staples on the skin.  A 15-French round drain was  left in the wound exited through a separate stab incision and secured in  placed with a suture.  The patient was awakened from anesthesia, extubated,  and transferred to recovery in stable condition.      Jefry H. Pollyann Kennedy, MD  Electronically Signed     JHR/MEDQ  D:  02/09/2006  T:  02/09/2006  Job:  [id number]   cc:   Angelia Mould. Derrell Lolling, M.D.  Edward L. Juanetta Gosling, M.D.  Laurice Record., M.D.

## 2010-11-20 NOTE — Discharge Summary (Signed)
NAMEEFTON, THOMLEY              ACCOUNT NO.:  [id number]   MEDICAL RECORD NO.:  [id number]          PATIENT TYPE:  INP   LOCATION:  5741                         FACILITY:  [facility]   PHYSICIAN:  Jefry H. Pollyann Kennedy, MD     DATE OF BIRTH:  1929/09/01   DATE OF ADMISSION:  02/09/2006  DATE OF DISCHARGE:  02/13/2006                                 DISCHARGE SUMMARY   ADMISSION DIAGNOSIS:  History of recent melanoma excision with positive  margin and depth greater than 1.25 mm.   DISCHARGE DIAGNOSIS:  History of melanoma, status post reexcision of  melanoma scar and right modified selective neck dissection.   PRIMARY CARE PHYSICIAN:  Dr. Shaune Pollack.   HISTORY AND HOSPITAL COURSE:  This 77 year old gentleman had a melanoma  removed from the right side of the neck at the angle of the mandible with a  positive margin and depth of at least 1.25 mm.  He underwent  lymphoscintigraphy which revealed uptake in the jugular digastric node,  supraclavicular node, and retroauricular node.  He was admitted to the  hospital on the day of admission, where he underwent a re-excision of the  scar and a modified neck dissection including levels 2, 3, 4, and  retroauricular nodes.  He tolerated the procedure well and was transferred  to the general nursing floor postoperatively.  He had his Foley catheter  removed on the evening of surgery and this required reinsertion because he  is having difficulty voiding.  He had it removed again a couple of days  later and had no problems with voiding.  His JP drain was left in place  until postoperative day #4, and he was discharged to home at that point in  good condition.   There were no complications during the stay and consultations and no other  significant events.  He was discharged home with instructions on care for  his incision and instructed to follow-up in our office the following week  for staple removal.  Pathology was pending at the time of  discharge.      Jefry H. Pollyann Kennedy, MD  Electronically Signed     JHR/MEDQ  D:  03/10/2006  T:  03/10/2006  Job:  [id number]   cc:   [name] Juanetta Gosling, M.D.

## 2010-12-14 ENCOUNTER — Ambulatory Visit (HOSPITAL_COMMUNITY)
Admission: RE | Admit: 2010-12-14 | Discharge: 2010-12-14 | Disposition: A | Discharge status: Home / Self Care | Payer: Medicare Other | Source: Ambulatory Visit | Attending: Oncology | Admitting: Oncology

## 2010-12-14 ENCOUNTER — Other Ambulatory Visit: Payer: Self-pay | Admitting: Oncology

## 2010-12-14 DIAGNOSIS — R059 Cough, unspecified: Secondary | ICD-10-CM | POA: Insufficient documentation

## 2010-12-14 DIAGNOSIS — M47814 Spondylosis without myelopathy or radiculopathy, thoracic region: Secondary | ICD-10-CM | POA: Insufficient documentation

## 2010-12-14 DIAGNOSIS — C439 Malignant melanoma of skin, unspecified: Secondary | ICD-10-CM

## 2010-12-14 DIAGNOSIS — R05 Cough: Secondary | ICD-10-CM | POA: Insufficient documentation

## 2010-12-14 DIAGNOSIS — R0602 Shortness of breath: Secondary | ICD-10-CM | POA: Insufficient documentation

## 2010-12-14 DIAGNOSIS — Z951 Presence of aortocoronary bypass graft: Secondary | ICD-10-CM | POA: Insufficient documentation

## 2010-12-15 ENCOUNTER — Other Ambulatory Visit: Payer: Self-pay | Admitting: Oncology

## 2010-12-15 ENCOUNTER — Encounter (HOSPITAL_BASED_OUTPATIENT_CLINIC_OR_DEPARTMENT_OTHER): Payer: Medicare Other | Admitting: Oncology

## 2010-12-15 DIAGNOSIS — Z85820 Personal history of malignant melanoma of skin: Secondary | ICD-10-CM

## 2010-12-15 DIAGNOSIS — C4330 Malignant melanoma of unspecified part of face: Secondary | ICD-10-CM

## 2010-12-15 DIAGNOSIS — D696 Thrombocytopenia, unspecified: Secondary | ICD-10-CM

## 2010-12-15 LAB — COMPREHENSIVE METABOLIC PANEL
ALT: 32 U/L (ref 0–53)
AST: 28 U/L (ref 0–37)
Albumin: 4.4 g/dL (ref 3.5–5.2)
Alkaline Phosphatase: 44 U/L (ref 39–117)
BUN: 15 mg/dL (ref 6–23)
CO2: 25 mEq/L (ref 19–32)
Calcium: 9.5 mg/dL (ref 8.4–10.5)
Chloride: 103 mEq/L (ref 96–112)
Creatinine, Ser: 1.21 mg/dL (ref 0.50–1.35)
Glucose, Bld: 111 mg/dL — ABNORMAL HIGH (ref 70–99)
Potassium: 4 mEq/L (ref 3.5–5.3)
Sodium: 138 mEq/L (ref 135–145)
Total Bilirubin: 2.2 mg/dL — ABNORMAL HIGH (ref 0.3–1.2)
Total Protein: 6.7 g/dL (ref 6.0–8.3)

## 2010-12-15 LAB — CBC WITH DIFFERENTIAL/PLATELET
BASO%: 0.4 % (ref 0.0–2.0)
Basophils Absolute: 0 10*3/uL (ref 0.0–0.1)
EOS%: 5 % (ref 0.0–7.0)
Eosinophils Absolute: 0.3 10*3/uL (ref 0.0–0.5)
HCT: 44.9 % (ref 38.4–49.9)
HGB: 16 g/dL (ref 13.0–17.1)
LYMPH%: 40.5 % (ref 14.0–49.0)
MCH: 32.4 pg (ref 27.2–33.4)
MCHC: 35.7 g/dL (ref 32.0–36.0)
MCV: 90.8 fL (ref 79.3–98.0)
MONO#: 0.3 10*3/uL (ref 0.1–0.9)
MONO%: 5.6 % (ref 0.0–14.0)
NEUT#: 2.7 10*3/uL (ref 1.5–6.5)
NEUT%: 48.5 % (ref 39.0–75.0)
Platelets: 88 10*3/uL — ABNORMAL LOW (ref 140–400)
RBC: 4.95 10*6/uL (ref 4.20–5.82)
RDW: 12.8 % (ref 11.0–14.6)
WBC: 5.6 10*3/uL (ref 4.0–10.3)
lymph#: 2.3 10*3/uL (ref 0.9–3.3)

## 2010-12-15 LAB — LACTATE DEHYDROGENASE: LDH: 171 U/L (ref 94–250)

## 2011-02-10 HISTORY — PX: NM MYOCAR PERF EJECTION FRACTION: HXRAD630

## 2011-02-15 ENCOUNTER — Other Ambulatory Visit: Payer: Self-pay | Admitting: Dermatology

## 2011-04-27 ENCOUNTER — Other Ambulatory Visit: Payer: Self-pay | Admitting: Dermatology

## 2011-06-02 ENCOUNTER — Encounter: Payer: Self-pay | Admitting: *Deleted

## 2011-06-15 ENCOUNTER — Other Ambulatory Visit (HOSPITAL_BASED_OUTPATIENT_CLINIC_OR_DEPARTMENT_OTHER): Payer: Medicare Other | Admitting: Lab

## 2011-06-15 ENCOUNTER — Other Ambulatory Visit: Payer: Self-pay | Admitting: Oncology

## 2011-06-15 ENCOUNTER — Ambulatory Visit (HOSPITAL_BASED_OUTPATIENT_CLINIC_OR_DEPARTMENT_OTHER): Payer: Medicare Other | Admitting: Oncology

## 2011-06-15 VITALS — BP 147/79 | HR 66 | Temp 97.1°F | Ht 71.0 in | Wt 209.0 lb

## 2011-06-15 DIAGNOSIS — C439 Malignant melanoma of skin, unspecified: Secondary | ICD-10-CM

## 2011-06-15 DIAGNOSIS — C4330 Malignant melanoma of unspecified part of face: Secondary | ICD-10-CM

## 2011-06-15 DIAGNOSIS — D696 Thrombocytopenia, unspecified: Secondary | ICD-10-CM

## 2011-06-15 DIAGNOSIS — Z85820 Personal history of malignant melanoma of skin: Secondary | ICD-10-CM

## 2011-06-15 LAB — COMPREHENSIVE METABOLIC PANEL
ALT: 36 U/L (ref 0–53)
AST: 34 U/L (ref 0–37)
Albumin: 4.3 g/dL (ref 3.5–5.2)
Alkaline Phosphatase: 39 U/L (ref 39–117)
BUN: 17 mg/dL (ref 6–23)
CO2: 25 mEq/L (ref 19–32)
Calcium: 9.9 mg/dL (ref 8.4–10.5)
Chloride: 104 mEq/L (ref 96–112)
Creatinine, Ser: 1.3 mg/dL (ref 0.50–1.35)
Glucose, Bld: 101 mg/dL — ABNORMAL HIGH (ref 70–99)
Potassium: 4.4 mEq/L (ref 3.5–5.3)
Sodium: 139 mEq/L (ref 135–145)
Total Bilirubin: 1.4 mg/dL — ABNORMAL HIGH (ref 0.3–1.2)
Total Protein: 6.7 g/dL (ref 6.0–8.3)

## 2011-06-15 LAB — CBC WITH DIFFERENTIAL/PLATELET
BASO%: 1.2 % (ref 0.0–2.0)
Basophils Absolute: 0.1 10*3/uL (ref 0.0–0.1)
EOS%: 3.9 % (ref 0.0–7.0)
Eosinophils Absolute: 0.2 10*3/uL (ref 0.0–0.5)
HCT: 46.1 % (ref 38.4–49.9)
HGB: 16.3 g/dL (ref 13.0–17.1)
LYMPH%: 41.1 % (ref 14.0–49.0)
MCH: 32.5 pg (ref 27.2–33.4)
MCHC: 35.3 g/dL (ref 32.0–36.0)
MCV: 92.1 fL (ref 79.3–98.0)
MONO#: 0.4 10*3/uL (ref 0.1–0.9)
MONO%: 6.3 % (ref 0.0–14.0)
NEUT#: 2.8 10*3/uL (ref 1.5–6.5)
NEUT%: 47.5 % (ref 39.0–75.0)
Platelets: 105 10*3/uL — ABNORMAL LOW (ref 140–400)
RBC: 5.01 10*6/uL (ref 4.20–5.82)
RDW: 12.6 % (ref 11.0–14.6)
WBC: 5.9 10*3/uL (ref 4.0–10.3)
lymph#: 2.4 10*3/uL (ref 0.9–3.3)

## 2011-06-15 NOTE — Progress Notes (Signed)
Hematology and Oncology Follow Up Visit  BAPTISTE LITTLER [id number] 12-Feb-1930 77 y.o. 06/15/2011 11:50 AM    CC: [name] Juanetta Gosling, M.D. Jefry H. Pollyann Kennedy, MD Elinor Parkinson. Worthy Rancher, M.D.    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    Prior Therapy: The patient received wide excision and lymph node dissection.  Pathology revealed a Clark level III, 1.25-mm melanoma without ulceration or vascular invasion.  He had 0/21 lymph nodes involved.  Did not receive any adjuvant therapy.   Current therapy: Observation and surveillance.  Interim History:   Mr. Klingel presents today for a followup visit.  He has continued to do very well without really anything to suggest a tumor relapse or recurrence.  He had not reported any chest pain, had not reported any abdominal pain, and had not reported any skin lesions.  Performance status activity level remains excellent at this point.  Medications: I have reviewed the patient's current medications. Current outpatient prescriptions:amLODipine-benazepril (LOTREL) 5-40 MG per capsule, Take 1 capsule by mouth daily. 5 mg , Disp: , Rfl: ;  aspirin 81 MG tablet, Take 81 mg by mouth daily.  , Disp: , Rfl: ;  benazepril (LOTENSIN) 10 MG tablet, Take 10 mg by mouth daily.  , Disp: , Rfl: ;  Choline Fenofibrate 135 MG capsule, Take 135 mg by mouth daily.  , Disp: , Rfl:  metoprolol succinate (TOPROL-XL) 25 MG 24 hr tablet, Take 25 mg by mouth 3 (three) times daily.  , Disp: , Rfl: ;  rOPINIRole (REQUIP) 1 MG tablet, Take 1 mg by mouth at bedtime.  , Disp: , Rfl:   Allergies:  Allergies  Allergen Reactions  . Tape     Past Medical History, Surgical history, Social history, and Family History were reviewed and updated.  Review of Systems: Constitutional:  Negative for fever, chills, night sweats, anorexia, weight loss, pain. Cardiovascular: no chest pain or dyspnea on  exertion Respiratory: no cough, shortness of breath, or wheezing Neurological: no TIA or stroke symptoms Dermatological: negative ENT: negative Skin: Negative. Gastrointestinal: no abdominal pain, change in bowel habits, or black or bloody stools Genito-Urinary: no dysuria, trouble voiding, or hematuria Hematological and Lymphatic: negative Breast: negative Musculoskeletal: negative Remaining ROS negative. Physical Exam: Blood pressure 147/79, pulse 66, temperature 97.1 F (36.2 C), temperature source Oral, height 5\' 11"  (1.803 m), weight 209 lb (94.802 kg). ECOG: 1 General appearance: alert Head: Normocephalic, without obvious abnormality, atraumatic Neck: no adenopathy, no carotid bruit, no JVD, supple, symmetrical, trachea midline and thyroid not enlarged, symmetric, no tenderness/mass/nodules Lymph nodes: Cervical, supraclavicular, and axillary nodes normal. Heart:regular rate and rhythm, S1, S2 normal, no murmur, click, rub or gallop Lung:chest clear, no wheezing, rales, normal symmetric air entry Abdomin: soft, non-tender, without masses or organomegaly EXT:no erythema, induration, or nodules   Lab Results: Lab Results  Component Value Date   WBC 5.9 06/15/2011   HGB 16.3 06/15/2011   HCT 46.1 06/15/2011   MCV 92.1 06/15/2011   PLT 105* 06/15/2011     Chemistry      Component Value Date/Time   NA 138 12/15/2010 1127   NA 138 12/15/2010 1127   K 4.0 12/15/2010 1127   K 4.0 12/15/2010 1127   CL 103 12/15/2010 1127   CL 103 12/15/2010 1127   CO2 25 12/15/2010 1127   CO2 25 12/15/2010 1127   BUN 15 12/15/2010 1127   BUN  15 12/15/2010 1127   CREATININE 1.21 12/15/2010 1127   CREATININE 1.21 12/15/2010 1127      Component Value Date/Time   CALCIUM 9.5 12/15/2010 1127   CALCIUM 9.5 12/15/2010 1127   ALKPHOS 44 12/15/2010 1127   ALKPHOS 44 12/15/2010 1127   AST 28 12/15/2010 1127   AST 28 12/15/2010 1127   ALT 32 12/15/2010 1127   ALT 32 12/15/2010 1127   BILITOT 2.2* 12/15/2010  1127   BILITOT 2.2* 12/15/2010 1127        Impression and Plan:   77 year old gentleman with the following issues: 1. Stage IB melanoma diagnosed in August 2007, status post wide excision and a neck dissection.  No evidence of any recurrent disease.  Continue active surveillance as we are doing right now.  He is following with Dr. Terri Piedra from Dermatology, and no evidence of any recurrence at this time. 2. Thrombocytopenia.  Continued to be rather mild.  Could have an element of idiopathic thrombocytopenic   purpura.  We will continue to monitor that. 3. Followup will be in 6 months.    Mada Sadik, MD 12/11/201211:50 AM

## 2011-12-15 ENCOUNTER — Other Ambulatory Visit (HOSPITAL_BASED_OUTPATIENT_CLINIC_OR_DEPARTMENT_OTHER): Payer: Medicare Other | Admitting: Lab

## 2011-12-15 ENCOUNTER — Telehealth: Payer: Self-pay | Admitting: Oncology

## 2011-12-15 ENCOUNTER — Ambulatory Visit (HOSPITAL_BASED_OUTPATIENT_CLINIC_OR_DEPARTMENT_OTHER): Payer: Medicare Other | Admitting: Oncology

## 2011-12-15 VITALS — BP 149/83 | HR 56 | Temp 96.8°F | Ht 71.0 in | Wt 207.3 lb

## 2011-12-15 DIAGNOSIS — C439 Malignant melanoma of skin, unspecified: Secondary | ICD-10-CM

## 2011-12-15 DIAGNOSIS — Z85820 Personal history of malignant melanoma of skin: Secondary | ICD-10-CM

## 2011-12-15 DIAGNOSIS — D696 Thrombocytopenia, unspecified: Secondary | ICD-10-CM

## 2011-12-15 LAB — COMPREHENSIVE METABOLIC PANEL
ALT: 29 U/L (ref 0–53)
AST: 29 U/L (ref 0–37)
Albumin: 4.2 g/dL (ref 3.5–5.2)
Alkaline Phosphatase: 36 U/L — ABNORMAL LOW (ref 39–117)
BUN: 18 mg/dL (ref 6–23)
CO2: 24 mEq/L (ref 19–32)
Calcium: 9.3 mg/dL (ref 8.4–10.5)
Chloride: 107 mEq/L (ref 96–112)
Creatinine, Ser: 1.35 mg/dL (ref 0.50–1.35)
Glucose, Bld: 96 mg/dL (ref 70–99)
Potassium: 4.1 mEq/L (ref 3.5–5.3)
Sodium: 139 mEq/L (ref 135–145)
Total Bilirubin: 1.6 mg/dL — ABNORMAL HIGH (ref 0.3–1.2)
Total Protein: 6.5 g/dL (ref 6.0–8.3)

## 2011-12-15 LAB — CBC WITH DIFFERENTIAL/PLATELET
BASO%: 0.6 % (ref 0.0–2.0)
Basophils Absolute: 0 10*3/uL (ref 0.0–0.1)
EOS%: 5.8 % (ref 0.0–7.0)
Eosinophils Absolute: 0.3 10*3/uL (ref 0.0–0.5)
HCT: 45.3 % (ref 38.4–49.9)
HGB: 15.8 g/dL (ref 13.0–17.1)
LYMPH%: 47.4 % (ref 14.0–49.0)
MCH: 31.6 pg (ref 27.2–33.4)
MCHC: 34.8 g/dL (ref 32.0–36.0)
MCV: 90.9 fL (ref 79.3–98.0)
MONO#: 0.4 10*3/uL (ref 0.1–0.9)
MONO%: 6.8 % (ref 0.0–14.0)
NEUT#: 2.2 10*3/uL (ref 1.5–6.5)
NEUT%: 39.4 % (ref 39.0–75.0)
Platelets: 94 10*3/uL — ABNORMAL LOW (ref 140–400)
RBC: 4.99 10*6/uL (ref 4.20–5.82)
RDW: 13.2 % (ref 11.0–14.6)
WBC: 5.5 10*3/uL (ref 4.0–10.3)
lymph#: 2.6 10*3/uL (ref 0.9–3.3)

## 2011-12-15 LAB — LACTATE DEHYDROGENASE: LDH: 145 U/L (ref 94–250)

## 2011-12-15 NOTE — Progress Notes (Signed)
Hematology and Oncology Follow Up Visit  John [name] [id number] [birth year] 78 y.o. 12/15/2011 10:21 AM    CC: John [name], M.D. John H. Pollyann Kennedy, MD John [name], M.D.    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    Prior Therapy: The patient received wide excision and lymph node dissection.  Pathology revealed a Clark level III, 1.25-mm melanoma without ulceration or vascular invasion.  He had 0/21 lymph nodes involved.  Did not receive any adjuvant therapy.  Current therapy: Observation and surveillance.  Interim History:   John [name] presents today for a followup visit.  He has continued to do very well without really anything to suggest a tumor relapse or recurrence.  He had not reported any chest pain, had not reported any abdominal pain, and had not reported any skin lesions.  Performance status activity level remains excellent at this point. He is reporting no bleeding or any new skin lesions. He still getting routine examinations by Dr. Terri Piedra.   Medications: I have reviewed the patient's current medications. Current outpatient prescriptions:amLODipine-benazepril (LOTREL) 5-40 MG per capsule, Take 1 capsule by mouth daily. 5 mg , Disp: , Rfl: ;  aspirin 81 MG tablet, Take 81 mg by mouth daily.  , Disp: , Rfl: ;  benazepril (LOTENSIN) 10 MG tablet, Take 10 mg by mouth daily.  , Disp: , Rfl: ;  Choline Fenofibrate 135 MG capsule, Take 135 mg by mouth daily.  , Disp: , Rfl:  metoprolol succinate (TOPROL-XL) 25 MG 24 hr tablet, Take 25 mg by mouth 3 (three) times daily.  , Disp: , Rfl: ;  rOPINIRole (REQUIP) 1 MG tablet, Take 1 mg by mouth at bedtime.  , Disp: , Rfl:   Allergies:  Allergies  Allergen Reactions  . Tape     Past Medical History, Surgical history, Social history, and Family History were reviewed and updated.  Review of Systems: Constitutional:  Negative for  fever, chills, night sweats, anorexia, weight loss, pain. Cardiovascular: no chest pain or dyspnea on exertion Respiratory: no cough, shortness of breath, or wheezing Neurological: no TIA or stroke symptoms Dermatological: negative ENT: negative Skin: Negative. Gastrointestinal: no abdominal pain, change in bowel habits, or black or bloody stools Genito-Urinary: no dysuria, trouble voiding, or hematuria Hematological and Lymphatic: negative Breast: negative Musculoskeletal: negative Remaining ROS negative. Physical Exam: Blood pressure 149/83, pulse 56, temperature 96.8 F (36 C), temperature source Oral, height 5\' 11"  (1.803 m), weight 207 lb 4.8 oz (94.031 kg). ECOG: 1 General appearance: alert Head: Normocephalic, without obvious abnormality, atraumatic Neck: no adenopathy, no carotid bruit, no JVD, supple, symmetrical, trachea midline and thyroid not enlarged, symmetric, no tenderness/mass/nodules Lymph nodes: Cervical, supraclavicular, and axillary nodes normal. Heart:regular rate and rhythm, S1, S2 normal, no murmur, click, rub or gallop Lung:chest clear, no wheezing, rales, normal symmetric air entry Abdomin: soft, non-tender, without masses or organomegaly EXT:no erythema, induration, or nodules Skin: No masses or lesions noted.    Lab Results: Lab Results  Component Value Date   WBC 5.5 12/15/2011   HGB 15.8 12/15/2011   HCT 45.3 12/15/2011   MCV 90.9 12/15/2011   PLT 94* 12/15/2011     Chemistry      Component Value Date/Time   NA 139 06/15/2011 1101   K 4.4 06/15/2011 1101   CL 104 06/15/2011 1101   CO2 25 06/15/2011 1101   BUN 17 06/15/2011 1101  CREATININE 1.30 06/15/2011 1101      Component Value Date/Time   CALCIUM 9.9 06/15/2011 1101   ALKPHOS 39 06/15/2011 1101   AST 34 06/15/2011 1101   ALT 36 06/15/2011 1101   BILITOT 1.4* 06/15/2011 1101        Impression and Plan:   78 year old gentleman with the following issues: 1. Stage IB melanoma  diagnosed in August 2007, status post wide excision and a neck dissection.  No evidence of any recurrent disease.  Continue active surveillance as we are doing right now.  He is following with Dr. Terri Piedra from Dermatology, and no evidence of any recurrence at this time. 2. Thrombocytopenia.  Continued to be rather mild.  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  We will continue to monitor that. 3. Followup will be in 6-9 months.    [facility], MD 6/12/201310:21 AM

## 2011-12-15 NOTE — Telephone Encounter (Signed)
appts made and printed for pt aom °

## 2012-01-25 ENCOUNTER — Other Ambulatory Visit: Payer: Self-pay | Admitting: Dermatology

## 2012-09-04 ENCOUNTER — Telehealth: Payer: Self-pay | Admitting: Oncology

## 2012-09-04 NOTE — Telephone Encounter (Signed)
pt wanted to r/s appt due to weather.John KitchenMarland KitchenMarland KitchenDone

## 2012-09-05 ENCOUNTER — Other Ambulatory Visit: Payer: Medicare Other | Admitting: Lab

## 2012-09-05 ENCOUNTER — Ambulatory Visit: Payer: Medicare Other | Admitting: Oncology

## 2012-10-17 ENCOUNTER — Ambulatory Visit (HOSPITAL_BASED_OUTPATIENT_CLINIC_OR_DEPARTMENT_OTHER): Payer: Medicare Other | Admitting: Oncology

## 2012-10-17 ENCOUNTER — Other Ambulatory Visit (HOSPITAL_BASED_OUTPATIENT_CLINIC_OR_DEPARTMENT_OTHER): Payer: Medicare Other | Admitting: Lab

## 2012-10-17 VITALS — BP 159/94 | HR 57 | Temp 97.5°F | Resp 20 | Ht 71.0 in | Wt 206.5 lb

## 2012-10-17 DIAGNOSIS — C4330 Malignant melanoma of unspecified part of face: Secondary | ICD-10-CM

## 2012-10-17 DIAGNOSIS — C439 Malignant melanoma of skin, unspecified: Secondary | ICD-10-CM

## 2012-10-17 DIAGNOSIS — D696 Thrombocytopenia, unspecified: Secondary | ICD-10-CM

## 2012-10-17 LAB — CBC WITH DIFFERENTIAL/PLATELET
BASO%: 0.6 % (ref 0.0–2.0)
Basophils Absolute: 0 10*3/uL (ref 0.0–0.1)
EOS%: 5.6 % (ref 0.0–7.0)
Eosinophils Absolute: 0.3 10*3/uL (ref 0.0–0.5)
HCT: 44.3 % (ref 38.4–49.9)
HGB: 15.3 g/dL (ref 13.0–17.1)
LYMPH%: 38.6 % (ref 14.0–49.0)
MCH: 30.8 pg (ref 27.2–33.4)
MCHC: 34.4 g/dL (ref 32.0–36.0)
MCV: 89.5 fL (ref 79.3–98.0)
MONO#: 0.3 10*3/uL (ref 0.1–0.9)
MONO%: 5.9 % (ref 0.0–14.0)
NEUT#: 2.6 10*3/uL (ref 1.5–6.5)
NEUT%: 49.3 % (ref 39.0–75.0)
Platelets: 98 10*3/uL — ABNORMAL LOW (ref 140–400)
RBC: 4.95 10*6/uL (ref 4.20–5.82)
RDW: 13.4 % (ref 11.0–14.6)
WBC: 5.3 10*3/uL (ref 4.0–10.3)
lymph#: 2.1 10*3/uL (ref 0.9–3.3)

## 2012-10-17 LAB — COMPREHENSIVE METABOLIC PANEL (CC13)
ALT: 32 U/L (ref 0–55)
AST: 31 U/L (ref 5–34)
Albumin: 3.7 g/dL (ref 3.5–5.0)
Alkaline Phosphatase: 48 U/L (ref 40–150)
BUN: 15 mg/dL (ref 7.0–26.0)
CO2: 24 mEq/L (ref 22–29)
Calcium: 9.3 mg/dL (ref 8.4–10.4)
Chloride: 106 mEq/L (ref 98–107)
Creatinine: 1.2 mg/dL (ref 0.7–1.3)
Glucose: 92 mg/dl (ref 70–99)
Potassium: 3.8 mEq/L (ref 3.5–5.1)
Sodium: 141 mEq/L (ref 136–145)
Total Bilirubin: 1.96 mg/dL — ABNORMAL HIGH (ref 0.20–1.20)
Total Protein: 6.6 g/dL (ref 6.4–8.3)

## 2012-10-17 NOTE — Progress Notes (Signed)
Hematology and Oncology Follow Up Visit  John [name] [id number] 16-May-1930 79 y.o. 10/17/2012 8:30 AM    CC: [name] Juanetta Gosling, M.D. Jefry H. Pollyann Kennedy, MD Elinor Parkinson. Worthy Rancher, M.D.    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  Prior Therapy: The patient received wide excision and lymph node dissection.  Pathology revealed a Clark level III, 1.25-mm melanoma without ulceration or vascular invasion.  He had 0/21 lymph nodes involved.  Did not receive any adjuvant therapy.  Current therapy: Observation and surveillance.  Interim History:   John [name] presents today for a followup visit.  He has continued to do very well without really anything to suggest a tumor relapse or recurrence.  He had not reported any chest pain, had not reported any abdominal pain, and had not reported any skin lesions.  Performance status activity level remains excellent at this point. He is reporting no bleeding or any new skin lesions. He still getting routine examinations by Dr. Terri Piedra. He has not reported any new illnesses or hospitalizations.   Medications: I have reviewed the patient's current medications. Current outpatient prescriptions:amLODipine-benazepril (LOTREL) 5-40 MG per capsule, Take 1 capsule by mouth daily. 5 mg , Disp: , Rfl: ;  aspirin 81 MG tablet, Take 81 mg by mouth daily.  , Disp: , Rfl: ;  benazepril (LOTENSIN) 10 MG tablet, Take 10 mg by mouth daily.  , Disp: , Rfl: ;  Choline Fenofibrate 135 MG capsule, Take 135 mg by mouth daily.  , Disp: , Rfl:  metoprolol succinate (TOPROL-XL) 25 MG 24 hr tablet, Take 25 mg by mouth 3 (three) times daily.  , Disp: , Rfl: ;  rOPINIRole (REQUIP) 1 MG tablet, Take 1 mg by mouth at bedtime.  , Disp: , Rfl:   Allergies:  Allergies  Allergen Reactions  . Tape     Past Medical History, Surgical history, Social history, and Family History were reviewed and  updated.  Review of Systems: Constitutional:  Negative for fever, chills, night sweats, anorexia, weight loss, pain. Cardiovascular: no chest pain or dyspnea on exertion Respiratory: no cough, shortness of breath, or wheezing Neurological: no TIA or stroke symptoms Dermatological: negative ENT: negative Skin: Negative. Gastrointestinal: no abdominal pain, change in bowel habits, or black or bloody stools Genito-Urinary: no dysuria, trouble voiding, or hematuria Hematological and Lymphatic: negative Breast: negative Musculoskeletal: negative Remaining ROS negative. Physical Exam: Blood pressure 159/94, pulse 57, temperature 97.5 F (36.4 C), temperature source Oral, resp. rate 20, height 5\' 11"  (1.803 m), weight 206 lb 8 oz (93.668 kg). ECOG: 1 General appearance: alert Head: Normocephalic, without obvious abnormality, atraumatic Neck: no adenopathy, no carotid bruit, no JVD, supple, symmetrical, trachea midline and thyroid not enlarged, symmetric, no tenderness/mass/nodules Lymph nodes: Cervical, supraclavicular, and axillary nodes normal. Heart:regular rate and rhythm, S1, S2 normal, no murmur, click, rub or gallop Lung:chest clear, no wheezing, rales, normal symmetric air entry Abdomin: soft, non-tender, without masses or organomegaly EXT:no erythema, induration, or nodules Skin: No masses or lesions noted.    Lab Results: Lab Results  Component Value Date   WBC 5.3 10/17/2012   HGB 15.3 10/17/2012   HCT 44.3 10/17/2012   MCV 89.5 10/17/2012   PLT 98* 10/17/2012     Chemistry      Component Value Date/Time   NA 139 12/15/2011 0941   K 4.1 12/15/2011 0941   CL 107 12/15/2011 0941   CO2 24  12/15/2011 0941   BUN 18 12/15/2011 0941   CREATININE 1.35 12/15/2011 0941      Component Value Date/Time   CALCIUM 9.3 12/15/2011 0941   ALKPHOS 36* 12/15/2011 0941   AST 29 12/15/2011 0941   ALT 29 12/15/2011 0941   BILITOT 1.6* 12/15/2011 0941        Impression and Plan:    79 year old gentleman with the following issues: 1. Stage IB melanoma diagnosed in August 2007, status post wide excision and a neck dissection.  No evidence of any recurrent disease.  Continue active surveillance as we are doing right now.  He is following with Dr. Terri Piedra from Dermatology, and no evidence of any recurrence at this time. 2. Thrombocytopenia.  Continued to be rather mild and stable.  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  We will continue to monitor that. 3. Followup will be in 12 months.    [facility], MD 4/15/20148:30 AM

## 2012-10-19 ENCOUNTER — Telehealth: Payer: Self-pay | Admitting: Oncology

## 2012-10-19 NOTE — Telephone Encounter (Signed)
s.w. pt and advised on 4.15.15 appt.Marland KitchenMarland KitchenMarland Kitchen

## 2013-03-15 ENCOUNTER — Telehealth: Payer: Self-pay | Admitting: *Deleted

## 2013-03-15 NOTE — Telephone Encounter (Signed)
Patient's wife Dare calling to say he feels numb and senses swelliing at surgery site from 2011 where melanoma was removed. Afebrile, no difficulty chewing or swallowing, per dr Clelia Croft, this is NOT likely cancer, will continue to watch and call back if it continues. Dare will call me tomorrow with an update.

## 2013-05-14 ENCOUNTER — Other Ambulatory Visit (HOSPITAL_COMMUNITY): Payer: Self-pay | Admitting: Pulmonary Disease

## 2013-05-14 DIAGNOSIS — IMO0002 Reserved for concepts with insufficient information to code with codable children: Secondary | ICD-10-CM

## 2013-05-15 ENCOUNTER — Ambulatory Visit (HOSPITAL_COMMUNITY)
Admission: RE | Admit: 2013-05-15 | Discharge: 2013-05-15 | Disposition: A | Discharge status: Home / Self Care | Payer: Medicare Other | Source: Ambulatory Visit | Attending: Pulmonary Disease | Admitting: Pulmonary Disease

## 2013-05-15 DIAGNOSIS — I6529 Occlusion and stenosis of unspecified carotid artery: Secondary | ICD-10-CM | POA: Insufficient documentation

## 2013-05-15 DIAGNOSIS — I658 Occlusion and stenosis of other precerebral arteries: Secondary | ICD-10-CM | POA: Insufficient documentation

## 2013-05-15 DIAGNOSIS — IMO0002 Reserved for concepts with insufficient information to code with codable children: Secondary | ICD-10-CM

## 2013-05-15 DIAGNOSIS — R0989 Other specified symptoms and signs involving the circulatory and respiratory systems: Secondary | ICD-10-CM | POA: Insufficient documentation

## 2013-09-17 DIAGNOSIS — E782 Mixed hyperlipidemia: Secondary | ICD-10-CM | POA: Insufficient documentation

## 2013-09-17 DIAGNOSIS — N200 Calculus of kidney: Secondary | ICD-10-CM

## 2013-09-17 DIAGNOSIS — I2510 Atherosclerotic heart disease of native coronary artery without angina pectoris: Secondary | ICD-10-CM | POA: Insufficient documentation

## 2013-09-17 DIAGNOSIS — E7800 Pure hypercholesterolemia, unspecified: Secondary | ICD-10-CM

## 2013-09-17 DIAGNOSIS — L719 Rosacea, unspecified: Secondary | ICD-10-CM

## 2013-09-17 DIAGNOSIS — G4730 Sleep apnea, unspecified: Secondary | ICD-10-CM

## 2013-09-17 DIAGNOSIS — I10 Essential (primary) hypertension: Secondary | ICD-10-CM

## 2013-09-17 DIAGNOSIS — E785 Hyperlipidemia, unspecified: Secondary | ICD-10-CM

## 2013-09-18 ENCOUNTER — Encounter: Payer: Self-pay | Admitting: Cardiology

## 2013-09-18 ENCOUNTER — Ambulatory Visit (INDEPENDENT_AMBULATORY_CARE_PROVIDER_SITE_OTHER): Payer: Medicare Other | Admitting: Cardiology

## 2013-09-18 VITALS — BP 142/80 | HR 62 | Ht 71.0 in | Wt 202.0 lb

## 2013-09-18 DIAGNOSIS — I2510 Atherosclerotic heart disease of native coronary artery without angina pectoris: Secondary | ICD-10-CM

## 2013-09-18 DIAGNOSIS — I10 Essential (primary) hypertension: Secondary | ICD-10-CM

## 2013-09-18 DIAGNOSIS — E782 Mixed hyperlipidemia: Secondary | ICD-10-CM

## 2013-09-18 NOTE — Patient Instructions (Addendum)
Your physician wants you to follow-up in: 6 months You will receive a reminder letter in the mail two months in advance. If you don't receive a letter, please call our office to schedule the follow-up appointment.     Your physician recommends that you continue on your current medications as directed. Please refer to the Current Medication list given to you today.      Thank you for choosing [facility] !        

## 2013-09-18 NOTE — Assessment & Plan Note (Signed)
Blood pressure mildly elevated today. Keep follow up Dr. Luan Pulling.

## 2013-09-18 NOTE — Assessment & Plan Note (Signed)
Clinically stable with history of multivessel disease status post CABG in early 1990s as outlined. ECG reviewed. He has had ischemic testing within the last 3 years that was low risk by report. No clear indication for followup testing at this time. Continue medical therapy and observation. Followup scheduled for 6 months.

## 2013-09-18 NOTE — Assessment & Plan Note (Signed)
LDL 118 as of this past July. He is not on statin therapy. Should keep follow with Dr. Luan Pulling. Could consider low-dose Lipitor or Crestor to get LDL closer to 70.

## 2013-09-18 NOTE — Progress Notes (Signed)
Clinical Summary [name] is an 81 y.o.male referred by Dr. Luan Pulling to establish cardiology followup. He is here with his wife today. States that he has been doing very well, no angina or unusual shortness of breath, no cardiac hospitalizations. Reports compliance with his medications and no obvious side effects.  Previous cardiology followup with was with Dr. Gwenlyn Found. Records indicate visit in January 2014, at which time the patient was clinically stable. Carotid Dopplers from November 2014 demonstrated less than 50% bilateral ICA stenoses. Myoview from August 2012 was described as low risk and without active ischemia. He has been managed medically.  Lab work from July 2014 showed hemoglobin 16.5, platelets 93, potassium 4.1, BUN 19, creatinine 1.1, normal LFTs, cholesterol 198, triglycerides 228, HDL 34, LDL 118, TSH 1.3.  ECG today shows sinus bradycardia with prolonged PR interval, Q. in lead III, rule out old inferior infarct.   Allergies  Allergen Reactions  . Statins   . Tape     Current Outpatient Prescriptions  Medication Sig Dispense Refill  . amLODipine-benazepril (LOTREL) 5-40 MG per capsule Take 1 capsule by mouth daily. 5 mg       . aspirin 81 MG tablet Take 81 mg by mouth daily.        . benazepril (LOTENSIN) 10 MG tablet Take 10 mg by mouth daily.        . metoprolol succinate (TOPROL-XL) 25 MG 24 hr tablet Take 25 mg by mouth 3 (three) times daily.        [name] rOPINIRole (REQUIP) 1 MG tablet Take 1 mg by mouth at bedtime.         No current facility-administered medications for this visit.    Past Medical History  Diagnosis Date  . Malignant melanoma of skin   . Coronary atherosclerosis of native coronary artery     Multivessel status post CABG in 1992  . OSA (obstructive sleep apnea)   . Rosacea   . Mixed hyperlipidemia   . Nephrolithiasis   . Essential hypertension, benign     Past Surgical History  Procedure Laterality Date  . Total knee arthroplasty     . Hernia repair    . Coronary artery bypass graft  1992    Family History  Problem Relation Age of Onset  . Skin cancer Brother   . Heart attack Father   . Heart attack Brother     Social History Mr. Klas reports that he has quit smoking. His smoking use included Cigarettes. He smoked 0.00 packs per day. He does not have any smokeless tobacco history on file. Mr. Pavao reports that he does not drink alcohol.  Review of Systems No palpitations or syncope. Stable appetite. No bleeding problems. Hard of hearing. No orthopnea or PND. Restless leg syndrome. Otherwise negative.  Physical Examination Filed Vitals:   09/18/13 0825  BP: 142/80  Pulse: 62   Filed Weights   09/18/13 0825  Weight: 202 lb (91.627 kg)   Overweight male, appears comfortable at rest. HEENT: Conjunctiva and lids normal, oropharynx clear. Neck: Supple, no elevated JVP, soft carotid bruits, no thyromegaly. Lungs: Clear to auscultation, nonlabored breathing at rest. Cardiac: Regular rate and rhythm, no S3, soft systolic murmur, no pericardial rub. Abdomen: Soft, nontender, bowel sounds present, no guarding or rebound. Extremities: Trace edema, distal pulses 1-2+. Skin: Warm and dry. Musculoskeletal: No kyphosis. Neuropsychiatric: Alert and oriented x3, affect grossly appropriate. Hard of hearing.   Problem List and Plan   Coronary atherosclerosis of  native coronary artery Clinically stable with history of multivessel disease status post CABG in early 1990s as outlined. ECG reviewed. He has had ischemic testing within the last 3 years that was low risk by report. No clear indication for followup testing at this time. Continue medical therapy and observation. Followup scheduled for 6 months.  Essential hypertension, benign Blood pressure mildly elevated today. Keep follow up Dr. Luan Pulling.  Mixed hyperlipidemia LDL 118 as of this past July. He is not on statin therapy. Should keep follow with Dr.  Luan Pulling. Could consider low-dose Lipitor or Crestor to get LDL closer to 70.    [name], M.D., F.A.C.C.

## 2013-10-04 ENCOUNTER — Telehealth: Payer: Self-pay | Admitting: Oncology

## 2013-10-04 NOTE — Telephone Encounter (Signed)
per FS to resch appt from 4/15 to 5/22. Wife Dare took new appt time & date

## 2013-10-17 ENCOUNTER — Ambulatory Visit: Payer: Medicare Other | Admitting: Oncology

## 2013-10-17 ENCOUNTER — Other Ambulatory Visit: Payer: Medicare Other

## 2013-11-12 ENCOUNTER — Other Ambulatory Visit (HOSPITAL_COMMUNITY): Payer: Self-pay | Admitting: Pulmonary Disease

## 2013-11-12 ENCOUNTER — Ambulatory Visit (HOSPITAL_COMMUNITY)
Admission: RE | Admit: 2013-11-12 | Discharge: 2013-11-12 | Disposition: A | Discharge status: Home / Self Care | Payer: Medicare Other | Source: Ambulatory Visit | Attending: Pulmonary Disease | Admitting: Pulmonary Disease

## 2013-11-12 DIAGNOSIS — G319 Degenerative disease of nervous system, unspecified: Secondary | ICD-10-CM | POA: Insufficient documentation

## 2013-11-12 DIAGNOSIS — R4789 Other speech disturbances: Secondary | ICD-10-CM | POA: Insufficient documentation

## 2013-11-12 DIAGNOSIS — I6789 Other cerebrovascular disease: Secondary | ICD-10-CM | POA: Insufficient documentation

## 2013-11-12 DIAGNOSIS — R4781 Slurred speech: Secondary | ICD-10-CM

## 2013-11-22 ENCOUNTER — Other Ambulatory Visit: Payer: Self-pay | Admitting: Oncology

## 2013-11-22 DIAGNOSIS — C439 Malignant melanoma of skin, unspecified: Secondary | ICD-10-CM

## 2013-11-23 ENCOUNTER — Telehealth: Payer: Self-pay | Admitting: Oncology

## 2013-11-23 ENCOUNTER — Other Ambulatory Visit (HOSPITAL_BASED_OUTPATIENT_CLINIC_OR_DEPARTMENT_OTHER): Payer: Medicare Other

## 2013-11-23 ENCOUNTER — Ambulatory Visit (HOSPITAL_BASED_OUTPATIENT_CLINIC_OR_DEPARTMENT_OTHER): Payer: Medicare Other | Admitting: Oncology

## 2013-11-23 VITALS — BP 171/82 | HR 51 | Temp 97.4°F | Resp 20 | Ht 71.0 in | Wt 199.4 lb

## 2013-11-23 DIAGNOSIS — D696 Thrombocytopenia, unspecified: Secondary | ICD-10-CM

## 2013-11-23 DIAGNOSIS — C411 Malignant neoplasm of mandible: Secondary | ICD-10-CM

## 2013-11-23 DIAGNOSIS — C439 Malignant melanoma of skin, unspecified: Secondary | ICD-10-CM

## 2013-11-23 LAB — COMPREHENSIVE METABOLIC PANEL (CC13)
ALT: 27 U/L (ref 0–55)
AST: 28 U/L (ref 5–34)
Albumin: 3.9 g/dL (ref 3.5–5.0)
Alkaline Phosphatase: 46 U/L (ref 40–150)
Anion Gap: 11 mEq/L (ref 3–11)
BUN: 14.5 mg/dL (ref 7.0–26.0)
CO2: 25 mEq/L (ref 22–29)
Calcium: 9.4 mg/dL (ref 8.4–10.4)
Chloride: 107 mEq/L (ref 98–109)
Creatinine: 1.2 mg/dL (ref 0.7–1.3)
Glucose: 103 mg/dl (ref 70–140)
Potassium: 4.2 mEq/L (ref 3.5–5.1)
Sodium: 142 mEq/L (ref 136–145)
Total Bilirubin: 2.75 mg/dL — ABNORMAL HIGH (ref 0.20–1.20)
Total Protein: 6.6 g/dL (ref 6.4–8.3)

## 2013-11-23 LAB — CBC WITH DIFFERENTIAL/PLATELET
BASO%: 0.7 % (ref 0.0–2.0)
Basophils Absolute: 0 10*3/uL (ref 0.0–0.1)
EOS%: 4.5 % (ref 0.0–7.0)
Eosinophils Absolute: 0.2 10*3/uL (ref 0.0–0.5)
HCT: 47.5 % (ref 38.4–49.9)
HGB: 16.1 g/dL (ref 13.0–17.1)
LYMPH%: 39.3 % (ref 14.0–49.0)
MCH: 30.4 pg (ref 27.2–33.4)
MCHC: 33.9 g/dL (ref 32.0–36.0)
MCV: 89.7 fL (ref 79.3–98.0)
MONO#: 0.3 10*3/uL (ref 0.1–0.9)
MONO%: 5.9 % (ref 0.0–14.0)
NEUT#: 2.7 10*3/uL (ref 1.5–6.5)
NEUT%: 49.6 % (ref 39.0–75.0)
Platelets: 90 10*3/uL — ABNORMAL LOW (ref 140–400)
RBC: 5.29 10*6/uL (ref 4.20–5.82)
RDW: 13.5 % (ref 11.0–14.6)
WBC: 5.5 10*3/uL (ref 4.0–10.3)
lymph#: 2.2 10*3/uL (ref 0.9–3.3)

## 2013-11-23 NOTE — Telephone Encounter (Signed)
gv pt appt schedule for may 2016.  °

## 2013-11-23 NOTE — Progress Notes (Signed)
Hematology and Oncology Follow Up Visit  VINH SACHS [id number] [birth year] 80 y.o. 11/23/2013 10:22 AM    CC: [name] [name], M.D. [name], MD [name]. [name], M.D.    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  Prior Therapy: He is S/P wide excision and lymph node dissection.  Pathology revealed a Clark level III, 1.25-mm melanoma without ulceration or vascular invasion.  He had 0/21 lymph nodes involved.  Did not receive any adjuvant therapy.  Current therapy: Observation and surveillance.  Interim History:   Mr. Galentine presents today for a followup visit with his wife.  Since his last visit, he developed slurred speech and was evaluated by his primary care physician with a CT scan. There was no clear evidence of a CVA or TIA. His medications were changed with some improvement in his speech. He has nothing  to suggest a tumor relapse or recurrence.  He had not reported any chest pain, had not reported any abdominal pain, and had not reported any skin lesions.  Performance status activity level remains excellent at this point. He is reporting no bleeding or any new skin lesions. He continues to followup with dermatology on a regular basis. He has not reported any headaches or blurry vision or double vision. Has not reported any syncope. Did not report any chest pain or shortness of breath. Does not report any abdominal pain or early satiety. Did not report any leg edema or arthralgias. Rest of his review of system is unremarkable.  Medication reviewed without any changes. Current Outpatient Prescriptions  Medication Sig Dispense Refill  . amLODipine-benazepril (LOTREL) 5-40 MG per capsule Take 1 capsule by mouth daily. 5 mg       . aspirin 81 MG tablet Take 81 mg by mouth daily.        . metoprolol succinate (TOPROL-XL) 25 MG 24 hr tablet Take 25 mg by mouth 3 (three) times daily.         . pantoprazole (PROTONIX) 40 MG tablet Take 40 mg by mouth daily.      [name] rOPINIRole (REQUIP) 1 MG tablet Take 1 mg by mouth at bedtime.         No current facility-administered medications for this visit.     Allergies:  Allergies  Allergen Reactions  . Statins   . Tape      Physical Exam: Blood pressure 171/82, pulse 51, temperature 97.4 F (36.3 C), temperature source Oral, resp. rate 20, height 5\' 11"  (1.803 m), weight 199 lb 6.4 oz (90.447 kg). ECOG: 1 General appearance: alert awake not in any distress. His speech was adequate. Head: Normocephalic, without obvious abnormality, atraumatic Neck: no adenopathy, thyroid appeared normal the Lymph nodes: Cervical, supraclavicular, and axillary nodes normal. Heart:regular rate and rhythm, S1, S2.  Lung:chest clear, no wheezing, rales, normal symmetric air entry Abdomin: soft, non-tender, without masses or organomegaly EXT:no erythema, induration, or nodules Skin: No masses or lesions noted.    Lab Results: Lab Results  Component Value Date   WBC 5.5 11/23/2013   HGB 16.1 11/23/2013   HCT 47.5 11/23/2013   MCV 89.7 11/23/2013   PLT 90* 11/23/2013     Chemistry      Component Value Date/Time   NA 142 11/23/2013 0936   NA 139 12/15/2011 0941   K 4.2 11/23/2013 0936   K 4.1 12/15/2011 0941   CL 106 10/17/2012 0754  CL 107 12/15/2011 0941   CO2 25 11/23/2013 0936   CO2 24 12/15/2011 0941   BUN 14.5 11/23/2013 0936   BUN 18 12/15/2011 0941   CREATININE 1.2 11/23/2013 0936   CREATININE 1.35 12/15/2011 0941      Component Value Date/Time   CALCIUM 9.4 11/23/2013 0936   CALCIUM 9.3 12/15/2011 0941   ALKPHOS 46 11/23/2013 0936   ALKPHOS 36* 12/15/2011 0941   AST 28 11/23/2013 0936   AST 29 12/15/2011 0941   ALT 27 11/23/2013 0936   ALT 29 12/15/2011 0941   BILITOT 2.75* 11/23/2013 0936   BILITOT 1.6* 12/15/2011 0941        Impression and Plan:   80 year old gentleman with the following issues: 1. Stage IB melanoma diagnosed in  August 2007, status post wide excision and a neck dissection.  He continues to have no evidence of any recurrent disease.  I plan to continue active surveillance as we are doing right now.  He is following with Dr. Allyson Sabal from Dermatology. 2. Thrombocytopenia.  Continued to be rather mild and stable without any evidence of bleeding.  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  We will continue to monitor that. 3. Followup will be in 12 months.    Wyatt Portela, MD 5/22/201510:22 AM

## 2014-02-05 ENCOUNTER — Encounter (HOSPITAL_COMMUNITY): Payer: Self-pay | Admitting: Pharmacy Technician

## 2014-02-06 NOTE — Patient Instructions (Addendum)
Your procedure is scheduled on:  02/11/2014  Report to [facility] at 11:00     AM.  Call this number if you have problems the morning of surgery: [phone]   Remember:   Do not eat or drink :After Midnight.    Take these medicines the morning of surgery with A SIP OF WATER: Amlodipine, Metorprolol, and Pantoprazole   Do not wear jewelry, make-up or nail polish.  Do not wear lotions, powders, or perfumes. You may wear deodorant.  Do not shave 48 hours prior to surgery.  Do not bring valuables to the hospital.  Contacts, dentures or bridgework may not be worn into surgery.  Patients discharged the day of surgery will not be allowed to drive home.  Name and phone number of your driver:    Please read over the following fact sheets that you were given: Pain Booklet, Surgical Site Infection Prevention, Anesthesia Post-op Instructions and Care and Recovery After Surgery  Cataract Surgery  A cataract is a clouding of the lens of the eye. When a lens becomes cloudy, vision is reduced based on the degree and nature of the clouding. Surgery may be needed to improve vision. Surgery removes the cloudy lens and usually replaces it with a substitute lens (intraocular lens, IOL). LET YOUR EYE DOCTOR KNOW ABOUT:  Allergies to food or medicine.   Medicines taken including herbs, eyedrops, over-the-counter medicines, and creams.   Use of steroids (by mouth or creams).   Previous problems with anesthetics or numbing medicine.   History of bleeding problems or blood clots.   Previous surgery.   Other health problems, including diabetes and kidney problems.   Possibility of pregnancy, if this applies.  RISKS AND COMPLICATIONS  Infection.   Inflammation of the eyeball (endophthalmitis) that can spread to both eyes (sympathetic ophthalmia).   Poor wound healing.   If an IOL is inserted, it can later fall out of proper position. This is very uncommon.   Clouding of the part of your eye  that holds an IOL in place. This is called an "after-cataract." These are uncommon, but easily treated.  BEFORE THE PROCEDURE  Do not eat or drink anything except small amounts of water for 8 to 12 before your surgery, or as directed by your caregiver.   Unless you are told otherwise, continue any eyedrops you have been prescribed.   Talk to your primary caregiver about all other medicines that you take (both prescription and non-prescription). In some cases, you may need to stop or change medicines near the time of your surgery. This is most important if you are taking blood-thinning medicine.Do not stop medicines unless you are told to do so.   Arrange for someone to drive you to and from the procedure.   Do not put contact lenses in either eye on the day of your surgery.  PROCEDURE There is more than one method for safely removing a cataract. Your doctor can explain the differences and help determine which is best for you. Phacoemulsification surgery is the most common form of cataract surgery.  An injection is given behind the eye or eyedrops are given to make this a painless procedure.   A small cut (incision) is made on the edge of the clear, dome-shaped surface that covers the front of the eye (cornea).   A tiny probe is painlessly inserted into the eye. This device gives off ultrasound waves that soften and break up the cloudy center of the lens. This makes  it easier for the cloudy lens to be removed by suction.   An IOL may be implanted.   The normal lens of the eye is covered by a clear capsule. Part of that capsule is intentionally left in the eye to support the IOL.   Your surgeon may or may not use stitches to close the incision.  There are other forms of cataract surgery that require a larger incision and stiches to close the eye. This approach is taken in cases where the doctor feels that the cataract cannot be easily removed using phacoemulsification. AFTER THE  PROCEDURE  When an IOL is implanted, it does not need care. It becomes a permanent part of your eye and cannot be seen or felt.   Your doctor will schedule follow-up exams to check on your progress.   Review your other medicines with your doctor to see which can be resumed after surgery.   Use eyedrops or take medicine as prescribed by your doctor.  Document Released: 06/10/2011 Document Reviewed: 06/07/2011 [facility] Patient Information 2012 [place].  .Cataract Surgery Care After Refer to this sheet in the next few weeks. These instructions provide you with information on caring for yourself after your procedure. Your caregiver may also give you more specific instructions. Your treatment has been planned according to current medical practices, but problems sometimes occur. Call your caregiver if you have any problems or questions after your procedure.  HOME CARE INSTRUCTIONS   Avoid strenuous activities as directed by your caregiver.   Ask your caregiver when you can resume driving.   Use eyedrops or other medicines to help healing and control pressure inside your eye as directed by your caregiver.   Only take over-the-counter or prescription medicines for pain, discomfort, or fever as directed by your caregiver.   Do not to touch or rub your eyes.   You may be instructed to use a protective shield during the first few days and nights after surgery. If not, wear sunglasses to protect your eyes. This is to protect the eye from pressure or from being accidentally bumped.   Keep the area around your eye clean and dry. Avoid swimming or allowing water to hit you directly in the face while showering. Keep soap and shampoo out of your eyes.   Do not bend or lift heavy objects. Bending increases pressure in the eye. You can walk, climb stairs, and do light household chores.   Do not put a contact lens into the eye that had surgery until your caregiver says it is okay to do so.    Ask your doctor when you can return to work. This will depend on the kind of work that you do. If you work in a dusty environment, you may be advised to wear protective eyewear for a period of time.   Ask your caregiver when it will be safe to engage in sexual activity.   Continue with your regular eye exams as directed by your caregiver.  What to expect:  It is normal to feel itching and mild discomfort for a few days after cataract surgery. Some fluid discharge is also common, and your eye may be sensitive to light and touch.   After 1 to 2 days, even moderate discomfort should disappear. In most cases, healing will take about 6 weeks.   If you received an intraocular lens (IOL), you may notice that colors are very bright or have a blue tinge. Also, if you have been in bright sunlight,  everything may appear reddish for a few hours. If you see these color tinges, it is because your lens is clear and no longer cloudy. Within a few months after receiving an IOL, these extra colors should go away. When you have healed, you will probably need new glasses.  SEEK MEDICAL CARE IF:   You have increased bruising around your eye.   You have discomfort not helped by medicine.  SEEK IMMEDIATE MEDICAL CARE IF:   You have a fever.   You have a worsening or sudden vision loss.   You have redness, swelling, or increasing pain in the eye.   You have a thick discharge from the eye that had surgery.  MAKE SURE YOU:  Understand these instructions.   Will watch your condition.   Will get help right away if you are not doing well or get worse.  Document Released: 01/08/2005 Document Revised: 06/10/2011 Document Reviewed: 02/12/2011 [facility] Patient Information 2012 [place].    Monitored Anesthesia Care  Monitored anesthesia care is an anesthesia service for a medical procedure. Anesthesia is the loss of the ability to feel pain. It is produced by medications called anesthetics. It may  affect a small area of your body (local anesthesia), a large area of your body (regional anesthesia), or your entire body (general anesthesia). The need for monitored anesthesia care depends your procedure, your condition, and the potential need for regional or general anesthesia. It is often provided during procedures where:   General anesthesia may be needed if there are complications. This is because you need special care when you are under general anesthesia.   You will be under local or regional anesthesia. This is so that you are able to have higher levels of anesthesia if needed.   You will receive calming medications (sedatives). This is especially the case if sedatives are given to put you in a semi-conscious state of relaxation (deep sedation). This is because the amount of sedative needed to produce this state can be hard to predict. Too much of a sedative can produce general anesthesia. Monitored anesthesia care is performed by one or more caregivers who have special training in all types of anesthesia. You will need to meet with these caregivers before your procedure. During this meeting, they will ask you about your medical history. They will also give you instructions to follow. (For example, you will need to stop eating and drinking before your procedure. You may also need to stop or change medications you are taking.) During your procedure, your caregivers will stay with you. They will:   Watch your condition. This includes watching you blood pressure, breathing, and level of pain.   Diagnose and treat problems that occur.   Give medications if they are needed. These may include calming medications (sedatives) and anesthetics.   Make sure you are comfortable.  Having monitored anesthesia care does not necessarily mean that you will be under anesthesia. It does mean that your caregivers will be able to manage anesthesia if you need it or if it occurs. It also means that you will  be able to have a different type of anesthesia than you are having if you need it. When your procedure is complete, your caregivers will continue to watch your condition. They will make sure any medications wear off before you are allowed to go home.  Document Released: 03/17/2005 Document Revised: 10/16/2012 Document Reviewed: 08/02/2012 [facility] Patient Information 2014 [place], [place].

## 2014-02-07 ENCOUNTER — Encounter (HOSPITAL_COMMUNITY)
Admission: RE | Admit: 2014-02-07 | Discharge: 2014-02-07 | Disposition: A | Discharge status: Home / Self Care | Payer: Medicare Other | Source: Ambulatory Visit | Attending: Ophthalmology | Admitting: Ophthalmology

## 2014-02-07 ENCOUNTER — Encounter (HOSPITAL_COMMUNITY): Payer: Self-pay

## 2014-02-07 DIAGNOSIS — Z01812 Encounter for preprocedural laboratory examination: Secondary | ICD-10-CM | POA: Insufficient documentation

## 2014-02-07 DIAGNOSIS — Z01818 Encounter for other preprocedural examination: Secondary | ICD-10-CM | POA: Diagnosis not present

## 2014-02-07 LAB — BASIC METABOLIC PANEL
Anion gap: 19 — ABNORMAL HIGH (ref 5–15)
BUN: 18 mg/dL (ref 6–23)
CO2: 17 mEq/L — ABNORMAL LOW (ref 19–32)
Calcium: 9.1 mg/dL (ref 8.4–10.5)
Chloride: 104 mEq/L (ref 96–112)
Creatinine, Ser: 1.23 mg/dL (ref 0.50–1.35)
GFR calc Af Amer: 61 mL/min — ABNORMAL LOW (ref >90)
GFR calc non Af Amer: 53 mL/min — ABNORMAL LOW (ref >90)
Glucose, Bld: 101 mg/dL — ABNORMAL HIGH (ref 70–99)
Potassium: 4.2 mEq/L (ref 3.7–5.3)
Sodium: 140 mEq/L (ref 137–147)

## 2014-02-07 LAB — HEMOGLOBIN AND HEMATOCRIT, BLOOD
HCT: 46.7 % (ref 39.0–52.0)
Hemoglobin: 16.7 g/dL (ref 13.0–17.0)

## 2014-02-08 MED ORDER — TETRACAINE HCL 0.5 % OP SOLN
OPHTHALMIC | Status: AC
  Filled 2014-02-08: qty 2

## 2014-02-08 MED ORDER — CYCLOPENTOLATE-PHENYLEPHRINE OP SOLN OPTIME - NO CHARGE
OPHTHALMIC | Status: AC
  Filled 2014-02-08: qty 2

## 2014-02-08 MED ORDER — LIDOCAINE HCL 3.5 % OP GEL
OPHTHALMIC | Status: AC
  Filled 2014-02-08: qty 1

## 2014-02-08 MED ORDER — LIDOCAINE HCL (PF) 1 % IJ SOLN
INTRAMUSCULAR | Status: AC
  Filled 2014-02-08: qty 2

## 2014-02-08 MED ORDER — PHENYLEPHRINE HCL 2.5 % OP SOLN
OPHTHALMIC | Status: AC
  Filled 2014-02-08: qty 15

## 2014-02-08 MED ORDER — NEOMYCIN-POLYMYXIN-DEXAMETH 3.5-10000-0.1 OP SUSP
OPHTHALMIC | Status: AC
  Filled 2014-02-08: qty 5

## 2014-02-11 ENCOUNTER — Encounter (HOSPITAL_COMMUNITY): Payer: Medicare Other | Admitting: Anesthesiology

## 2014-02-11 ENCOUNTER — Encounter (HOSPITAL_COMMUNITY): Payer: Self-pay | Admitting: *Deleted

## 2014-02-11 ENCOUNTER — Ambulatory Visit (HOSPITAL_COMMUNITY)
Admission: RE | Admit: 2014-02-11 | Discharge: 2014-02-11 | Disposition: A | Discharge status: Home / Self Care | Payer: Medicare Other | Source: Ambulatory Visit | Attending: Ophthalmology | Admitting: Ophthalmology

## 2014-02-11 ENCOUNTER — Encounter (HOSPITAL_COMMUNITY)
Admission: RE | Disposition: A | Discharge status: Home / Self Care | Payer: Self-pay | Source: Ambulatory Visit | Attending: Ophthalmology

## 2014-02-11 ENCOUNTER — Ambulatory Visit (HOSPITAL_COMMUNITY): Payer: Medicare Other | Admitting: Anesthesiology

## 2014-02-11 DIAGNOSIS — H2510 Age-related nuclear cataract, unspecified eye: Secondary | ICD-10-CM | POA: Insufficient documentation

## 2014-02-11 HISTORY — PX: CATARACT EXTRACTION W/PHACO: SHX586

## 2014-02-11 SURGERY — PHACOEMULSIFICATION, CATARACT, WITH IOL INSERTION
Anesthesia: Monitor Anesthesia Care | Site: Eye | Laterality: Right

## 2014-02-11 MED ORDER — PHENYLEPHRINE HCL 2.5 % OP SOLN
1.0000 [drp] | OPHTHALMIC | Status: AC
  Administered 2014-02-11 (×3): 1 [drp] via OPHTHALMIC

## 2014-02-11 MED ORDER — LIDOCAINE HCL (PF) 1 % IJ SOLN
INTRAMUSCULAR | Status: DC | PRN
  Administered 2014-02-11: .5 mL

## 2014-02-11 MED ORDER — NA HYALUR & NA CHOND-NA HYALUR 0.55-0.5 ML IO KIT
PACK | INTRAOCULAR | Status: DC | PRN
  Administered 2014-02-11: 1 via OPHTHALMIC

## 2014-02-11 MED ORDER — FENTANYL CITRATE 0.05 MG/ML IJ SOLN
INTRAMUSCULAR | Status: AC
  Filled 2014-02-11: qty 2

## 2014-02-11 MED ORDER — TETRACAINE HCL 0.5 % OP SOLN
1.0000 [drp] | OPHTHALMIC | Status: AC
  Administered 2014-02-11 (×3): 1 [drp] via OPHTHALMIC

## 2014-02-11 MED ORDER — LIDOCAINE HCL 3.5 % OP GEL
1.0000 "application " | Freq: Once | OPHTHALMIC | Status: AC
  Administered 2014-02-11: 1 via OPHTHALMIC

## 2014-02-11 MED ORDER — NEOMYCIN-POLYMYXIN-DEXAMETH 3.5-10000-0.1 OP SUSP
OPHTHALMIC | Status: DC | PRN
  Administered 2014-02-11: 2 [drp] via OPHTHALMIC

## 2014-02-11 MED ORDER — LIDOCAINE 3.5 % OP GEL OPTIME - NO CHARGE
OPHTHALMIC | Status: DC | PRN
  Administered 2014-02-11: 1 [drp] via OPHTHALMIC

## 2014-02-11 MED ORDER — MIDAZOLAM HCL 2 MG/2ML IJ SOLN
1.0000 mg | INTRAMUSCULAR | Status: DC | PRN
  Administered 2014-02-11: 2 mg via INTRAVENOUS

## 2014-02-11 MED ORDER — POVIDONE-IODINE 5 % OP SOLN
OPHTHALMIC | Status: DC | PRN
  Administered 2014-02-11: 1 via OPHTHALMIC

## 2014-02-11 MED ORDER — MIDAZOLAM HCL 2 MG/2ML IJ SOLN
INTRAMUSCULAR | Status: AC
  Filled 2014-02-11: qty 2

## 2014-02-11 MED ORDER — EPINEPHRINE HCL 1 MG/ML IJ SOLN
INTRAMUSCULAR | Status: AC
  Filled 2014-02-11: qty 1

## 2014-02-11 MED ORDER — EPINEPHRINE HCL 1 MG/ML IJ SOLN
INTRAOCULAR | Status: DC | PRN
  Administered 2014-02-11: 12:00:00

## 2014-02-11 MED ORDER — CYCLOPENTOLATE-PHENYLEPHRINE 0.2-1 % OP SOLN
1.0000 [drp] | OPHTHALMIC | Status: AC
  Administered 2014-02-11 (×3): 1 [drp] via OPHTHALMIC

## 2014-02-11 MED ORDER — LACTATED RINGERS IV SOLN
INTRAVENOUS | Status: DC
  Administered 2014-02-11: 11:00:00 via INTRAVENOUS

## 2014-02-11 MED ORDER — BSS IO SOLN
INTRAOCULAR | Status: DC | PRN
  Administered 2014-02-11: 15 mL

## 2014-02-11 MED ORDER — FENTANYL CITRATE 0.05 MG/ML IJ SOLN
25.0000 ug | INTRAMUSCULAR | Status: AC
  Administered 2014-02-11 (×2): 25 ug via INTRAVENOUS

## 2014-02-11 SURGICAL SUPPLY — 34 items
CAPSULAR TENSION RING-AMO (OPHTHALMIC RELATED) IMPLANT
CLOTH BEACON ORANGE TIMEOUT ST (SAFETY) ×2 IMPLANT
EYE SHIELD UNIVERSAL CLEAR (GAUZE/BANDAGES/DRESSINGS) ×2 IMPLANT
GLOVE BIO SURGEON STRL SZ 6.5 (GLOVE) IMPLANT
GLOVE BIO SURGEONS STRL SZ 6.5 (GLOVE)
GLOVE BIOGEL PI IND STRL 6.5 (GLOVE) IMPLANT
GLOVE BIOGEL PI IND STRL 7.0 (GLOVE) IMPLANT
GLOVE BIOGEL PI IND STRL 7.5 (GLOVE) IMPLANT
GLOVE BIOGEL PI INDICATOR 6.5 (GLOVE) ×2
GLOVE BIOGEL PI INDICATOR 7.0 (GLOVE)
GLOVE BIOGEL PI INDICATOR 7.5 (GLOVE)
GLOVE ECLIPSE 6.5 STRL STRAW (GLOVE) IMPLANT
GLOVE ECLIPSE 7.0 STRL STRAW (GLOVE) IMPLANT
GLOVE ECLIPSE 7.5 STRL STRAW (GLOVE) IMPLANT
GLOVE EXAM NITRILE LRG STRL (GLOVE) IMPLANT
GLOVE EXAM NITRILE MD LF STRL (GLOVE) ×2 IMPLANT
GLOVE SKINSENSE NS SZ6.5 (GLOVE)
GLOVE SKINSENSE NS SZ7.0 (GLOVE)
GLOVE SKINSENSE STRL SZ6.5 (GLOVE) IMPLANT
GLOVE SKINSENSE STRL SZ7.0 (GLOVE) IMPLANT
KIT VITRECTOMY (OPHTHALMIC RELATED) IMPLANT
PAD ARMBOARD 7.5X6 YLW CONV (MISCELLANEOUS) ×2 IMPLANT
PROC W NO LENS (INTRAOCULAR LENS)
PROC W SPEC LENS (INTRAOCULAR LENS)
PROCESS W NO LENS (INTRAOCULAR LENS) IMPLANT
PROCESS W SPEC LENS (INTRAOCULAR LENS) IMPLANT
RETRACTOR IRIS SIGHTPATH (OPHTHALMIC RELATED) ×1 IMPLANT
RING MALYGIN (MISCELLANEOUS) IMPLANT
SIGHTPATH CAT PROC W REG LENS (Ophthalmic Related) ×3 IMPLANT
SYRINGE LUER LOK 1CC (MISCELLANEOUS) ×2 IMPLANT
TAPE SURG TRANSPORE 1 IN (GAUZE/BANDAGES/DRESSINGS) IMPLANT
TAPE SURGICAL TRANSPORE 1 IN (GAUZE/BANDAGES/DRESSINGS) ×2
VISCOELASTIC ADDITIONAL (OPHTHALMIC RELATED) IMPLANT
WATER STERILE IRR 250ML POUR (IV SOLUTION) ×2 IMPLANT

## 2014-02-11 NOTE — Transfer of Care (Signed)
Immediate Anesthesia Transfer of Care Note  Patient: John [name]  Procedure(s) Performed: Procedure(s) with comments: CATARACT EXTRACTION PHACO AND INTRAOCULAR LENS PLACEMENT (IOC) (Right) - CDE 13.83  Patient Location: Short Stay  Anesthesia Type:MAC  Level of Consciousness: awake  Airway & Oxygen Therapy: Patient Spontanous Breathing  Post-op Assessment: Report given to PACU RN  Post vital signs: Reviewed  Complications: No apparent anesthesia complications

## 2014-02-11 NOTE — Op Note (Signed)
Date of Admission: 02/11/2014  Date of Surgery: 02/11/2014   Pre-Op Dx: Cataract Right Eye  Post-Op Dx: Senile Nuclear  Cataract Right  Eye,  Dx Code 366.16  Surgeon: [name], M.D.  Assistants: None  Anesthesia: Topical with MAC  Indications: Painless, progressive loss of vision with compromise of daily activities.  Surgery: Cataract Extraction with Intraocular lens Implant Right Eye  Discription: The patient had dilating drops and viscous lidocaine placed into the Right eye in the pre-op holding area. After transfer to the operating room, a time out was performed. The patient was then prepped and draped. Beginning with a 38 degree blade a paracentesis port was made at the surgeon's 2 o'clock position. The anterior chamber was then filled with 1% non-preserved lidocaine. This was followed by filling the anterior chamber with Provisc.  A 2.47mm keratome blade was used to make a clear corneal incision at the temporal limbus.  A bent cystatome needle was used to create a continuous tear capsulotomy. Hydrodissection was performed with balanced salt solution on a Fine canula. The lens nucleus was then removed using the phacoemulsification handpiece. Residual cortex was removed with the I&A handpiece. The anterior chamber and capsular bag were refilled with Provisc. A posterior chamber intraocular lens was placed into the capsular bag with it's injector. The implant was positioned with the Kuglan hook. The Provisc was then removed from the anterior chamber and capsular bag with the I&A handpiece. Stromal hydration of the main incision and paracentesis port was performed with BSS on a Fine canula. The wounds were tested for leak which was negative. The patient tolerated the procedure well. There were no operative complications. The patient was then transferred to the recovery room in stable condition.  Complications: None  Specimen: None  EBL: None  Prosthetic device: Hoya iSert 250, power 20.0 D,  SN [id number].

## 2014-02-11 NOTE — Anesthesia Postprocedure Evaluation (Signed)
  Anesthesia Post-op Note  Patient: John [name]  Procedure(s) Performed: Procedure(s) with comments: CATARACT EXTRACTION PHACO AND INTRAOCULAR LENS PLACEMENT (IOC) (Right) - CDE 13.83  Patient Location: Short Stay  Anesthesia Type:MAC  Level of Consciousness: awake, alert  and oriented  Airway and Oxygen Therapy: Patient Spontanous Breathing  Post-op Pain: none  Post-op Assessment: Post-op Vital signs reviewed, Patient's Cardiovascular Status Stable, Respiratory Function Stable, Patent Airway and No signs of Nausea or vomiting  Post-op Vital Signs: Reviewed and stable  Last Vitals:  Filed Vitals:   02/11/14 1112  BP: 158/89  Pulse: 53  Temp: 36.5 C  Resp: 17    Complications: No apparent anesthesia complications

## 2014-02-11 NOTE — Discharge Instructions (Signed)

## 2014-02-11 NOTE — Anesthesia Preprocedure Evaluation (Signed)
Anesthesia Evaluation  Patient identified by MRN, date of birth, ID band Patient awake    Reviewed: Allergy & Precautions, H&P , NPO status , Patient's Chart, lab work & pertinent test results, reviewed documented beta blocker date and time   Airway Mallampati: III TM Distance: >3 FB     Dental  (+) Teeth Intact   Pulmonary sleep apnea , former smoker,  breath sounds clear to auscultation        Cardiovascular hypertension, Pt. on medications and Pt. on home beta blockers + CAD and + CABG Rhythm:Regular Rate:Normal     Neuro/Psych    GI/Hepatic negative GI ROS,   Endo/Other    Renal/GU Renal disease     Musculoskeletal   Abdominal   Peds  Hematology   Anesthesia Other Findings   Reproductive/Obstetrics                           Anesthesia Physical Anesthesia Plan  ASA: III  Anesthesia Plan: MAC   Post-op Pain Management:    Induction: Intravenous  Airway Management Planned: Nasal Cannula  Additional Equipment:   Intra-op Plan:   Post-operative Plan:   Informed Consent: I have reviewed the patients History and Physical, chart, labs and discussed the procedure including the risks, benefits and alternatives for the proposed anesthesia with the patient or authorized representative who has indicated his/her understanding and acceptance.     Plan Discussed with:   Anesthesia Plan Comments:         Anesthesia Quick Evaluation

## 2014-02-11 NOTE — H&P (Signed)
I have reviewed the H&P, the patient was re-examined, and I have identified no interval changes in medical condition and plan of care since the history and physical of record  

## 2014-02-13 ENCOUNTER — Encounter (HOSPITAL_COMMUNITY): Payer: Self-pay | Admitting: Ophthalmology

## 2014-03-18 ENCOUNTER — Ambulatory Visit (INDEPENDENT_AMBULATORY_CARE_PROVIDER_SITE_OTHER): Payer: Medicare Other | Admitting: Cardiology

## 2014-03-18 ENCOUNTER — Encounter: Payer: Self-pay | Admitting: Cardiology

## 2014-03-18 VITALS — BP 138/80 | HR 61 | Ht 66.0 in | Wt 203.0 lb

## 2014-03-18 DIAGNOSIS — I10 Essential (primary) hypertension: Secondary | ICD-10-CM

## 2014-03-18 DIAGNOSIS — I2510 Atherosclerotic heart disease of native coronary artery without angina pectoris: Secondary | ICD-10-CM

## 2014-03-18 DIAGNOSIS — E782 Mixed hyperlipidemia: Secondary | ICD-10-CM

## 2014-03-18 NOTE — Patient Instructions (Signed)
Your physician wants you to follow-up in: 6 months You will receive a reminder letter in the mail two months in advance. If you don't receive a letter, please call our office to schedule the follow-up appointment.     Your physician recommends that you continue on your current medications as directed. Please refer to the Current Medication list given to you today.     Please get FASTING blood work now (LIPID,LFT's)      Thank you for choosing [facility] !

## 2014-03-18 NOTE — Assessment & Plan Note (Signed)
History of statin intolerance. Check FLP and LFT for new baseline, last LDL was 118.

## 2014-03-18 NOTE — Assessment & Plan Note (Signed)
Symptomatically stable on current medical regimen. Continue observation, no changes made today.

## 2014-03-18 NOTE — Progress Notes (Signed)
    Clinical Summary Mr. Navarro is an 80 y.o.male last seen in March. He states that he has been doing very well, no angina symptoms or nitroglycerin use.  Lipid panel from July of last year showed cholesterol 198, triglycerides 228, HDL 34, LDL 118. Recent lab work in August showed potassium 4.2, BUN 18, creatinine 1.2, hemoglobin 16.7. History of statin intolerance. No recent followup lipid panel.  Previous cardiology followup with was with Dr. Gwenlyn Found. Records indicate visit in January 2014, at which time the patient was clinically stable. Carotid Dopplers from November 2014 demonstrated less than 50% bilateral ICA stenoses. Myoview from August 2012 was described as low risk and without active ischemia. He has been managed medically.   Allergies  Allergen Reactions  . Statins Other (See Comments)    Weak,nervous  . Tape Rash    Current Outpatient Prescriptions  Medication Sig Dispense Refill  . amLODipine (NORVASC) 5 MG tablet Take 5 mg by mouth daily.      [name] aspirin EC 81 MG tablet Take 81 mg by mouth daily.      . metoprolol tartrate (LOPRESSOR) 25 MG tablet Take 25 mg by mouth 3 (three) times daily.      . pantoprazole (PROTONIX) 40 MG tablet Take 40 mg by mouth daily.       No current facility-administered medications for this visit.    Past Medical History  Diagnosis Date  . Malignant melanoma of skin   . Coronary atherosclerosis of native coronary artery     Multivessel status post CABG in 1992  . OSA (obstructive sleep apnea)   . Rosacea   . Mixed hyperlipidemia   . Nephrolithiasis   . Essential hypertension, benign     Social History Mr. Olden reports that he has quit smoking. His smoking use included Cigarettes. He smoked 0.00 packs per day. He has never used smokeless tobacco. Mr. Larch reports that he does not drink alcohol.  Review of Systems Hard of hearing. No palpitations or dizziness. States that he was having trouble with his speech slurring,  thinks it may have been due to a medication that he is no longer taking. Other systems reviewed and negative.  Physical Examination Filed Vitals:   03/18/14 0942  BP: 138/80  Pulse: 61   Filed Weights   03/18/14 0942  Weight: 203 lb (92.08 kg)    Overweight male, appears comfortable at rest.  HEENT: Conjunctiva and lids normal, oropharynx clear.  Neck: Supple, no elevated JVP, soft carotid bruits, no thyromegaly.  Lungs: Clear to auscultation, nonlabored breathing at rest.  Cardiac: Regular rate and rhythm, no S3, soft systolic murmur, no pericardial rub.  Abdomen: Soft, nontender, bowel sounds present, no guarding or rebound.  Extremities: Trace edema, distal pulses 1-2+.  Skin: Warm and dry.  Musculoskeletal: No kyphosis.  Neuropsychiatric: Alert and oriented x3, affect grossly appropriate. Hard of hearing.   Problem List and Plan   Coronary atherosclerosis of native coronary artery Symptomatically stable on current medical regimen. Continue observation, no changes made today.  Mixed hyperlipidemia History of statin intolerance. Check FLP and LFT for new baseline, last LDL was 118.  Essential hypertension, benign Blood pressure reasonably well controlled.    [name], M.D., F.A.C.C.

## 2014-03-18 NOTE — Assessment & Plan Note (Signed)
Blood pressure reasonably well controlled. ?

## 2014-03-19 LAB — HEPATIC FUNCTION PANEL
ALT: 36 U/L (ref 0–53)
AST: 30 U/L (ref 0–37)
Albumin: 4.2 g/dL (ref 3.5–5.2)
Alkaline Phosphatase: 44 U/L (ref 39–117)
Bilirubin, Direct: 0.4 mg/dL — ABNORMAL HIGH (ref 0.0–0.3)
Indirect Bilirubin: 2 mg/dL — ABNORMAL HIGH (ref 0.2–1.2)
Total Bilirubin: 2.4 mg/dL — ABNORMAL HIGH (ref 0.2–1.2)
Total Protein: 6.5 g/dL (ref 6.0–8.3)

## 2014-03-19 LAB — LIPID PANEL
Cholesterol: 171 mg/dL (ref 0–200)
HDL: 33 mg/dL — ABNORMAL LOW (ref >39)
LDL Cholesterol: 78 mg/dL (ref 0–99)
Total CHOL/HDL Ratio: 5.2 Ratio
Triglycerides: 302 mg/dL — ABNORMAL HIGH (ref <150)
VLDL: 60 mg/dL — ABNORMAL HIGH (ref 0–40)

## 2014-03-20 ENCOUNTER — Telehealth: Payer: Self-pay | Admitting: *Deleted

## 2014-03-20 NOTE — Telephone Encounter (Signed)
Pt wife notified and pt will start taking omega 1000 mg BID. Forwarded to Dr. Luan Pulling

## 2014-03-20 NOTE — Telephone Encounter (Signed)
Message copied by Desma Mcgregor on Wed Mar 20, 2014 12:01 PM ------      Message from: MCDOWELL, Aloha Gell      Created: Wed Mar 20, 2014  7:35 AM       Reviewed. LDL cholesterol looks good at 78. His triglycerides are elevated at 302. Might consider taking omega-3 supplements 1000 mg twice daily. I know he has a history of statin intolerance. ------

## 2014-09-23 ENCOUNTER — Ambulatory Visit (INDEPENDENT_AMBULATORY_CARE_PROVIDER_SITE_OTHER): Payer: Medicare Other | Admitting: Cardiology

## 2014-09-23 ENCOUNTER — Encounter: Payer: Self-pay | Admitting: Cardiology

## 2014-09-23 VITALS — BP 136/86 | HR 61 | Ht 66.0 in | Wt 206.0 lb

## 2014-09-23 DIAGNOSIS — I2510 Atherosclerotic heart disease of native coronary artery without angina pectoris: Secondary | ICD-10-CM

## 2014-09-23 DIAGNOSIS — I10 Essential (primary) hypertension: Secondary | ICD-10-CM

## 2014-09-23 DIAGNOSIS — E781 Pure hyperglyceridemia: Secondary | ICD-10-CM | POA: Diagnosis not present

## 2014-09-23 DIAGNOSIS — I6523 Occlusion and stenosis of bilateral carotid arteries: Secondary | ICD-10-CM | POA: Diagnosis not present

## 2014-09-23 NOTE — Progress Notes (Signed)
Cardiology Office Note  Date: 09/23/2014   ID: John [name], DOB Apr 23, 1930, MRN [id number]  PCP: Alonza Bogus, MD  Primary Cardiologist: John Lesches, MD   Chief Complaint  Patient presents with  . Coronary Artery Disease  . Hypertension  . Hyperlipidemia    History of Present Illness: John [name] is an 81 y.o. male last seen in September 2015. He is here with his wife today for a follow-up visit. He does not endorse any angina symptoms, has chronic stable dyspnea on exertion. We discussed his medications which are outlined below, there have been no significant changes other than our recommended addition of omega-3 supplements. He has tolerated these.  Myoview from August 2012 was described as low risk and without active ischemia. He has been managed medically.  It has been 2-3 years since his last carotid Dopplers, at which time he had less than 50% bilateral ICA stenoses. He does have a prominent pulsation at the right base of his neck in the region of the carotid that he sometimes notices. He has a scar on the right side of his neck status post prior melanoma excision.   Past Medical History  Diagnosis Date  . Malignant melanoma of skin   . Coronary atherosclerosis of native coronary artery     Multivessel status post CABG in 1992  . OSA (obstructive sleep apnea)   . Rosacea   . Mixed hyperlipidemia   . Nephrolithiasis   . Essential hypertension, benign     Past Surgical History  Procedure Laterality Date  . Total knee arthroplasty    . Hernia repair    . Coronary artery bypass graft  1992  . Melanoma excision      neck  . Cataract extraction w/phaco Right 02/11/2014    Procedure: CATARACT EXTRACTION PHACO AND INTRAOCULAR LENS PLACEMENT (IOC);  Surgeon: John Branch, MD;  Location: [facility];  Service: Ophthalmology;  Laterality: Right;  CDE 13.83    Current Outpatient Prescriptions  Medication Sig Dispense Refill  . amLODipine (NORVASC) 5 MG tablet  Take 5 mg by mouth daily.    [name] aspirin EC 81 MG tablet Take 81 mg by mouth daily.    . metoprolol tartrate (LOPRESSOR) 25 MG tablet Take 25 mg by mouth 3 (three) times daily.    . Omega-3 Fatty Acids (FISH OIL) 1000 MG CAPS Take 1,000 mg by mouth 2 (two) times daily.    . pantoprazole (PROTONIX) 40 MG tablet Take 40 mg by mouth daily.     No current facility-administered medications for this visit.    Allergies:  Statins and Tape   Social History: The patient  reports that he quit smoking about 33 years ago. His smoking use included Cigarettes. He started smoking about 71 years ago. He has never used smokeless tobacco. He reports that he does not drink alcohol or use illicit drugs.   ROS:  Please see the history of present illness. Otherwise, complete review of systems is positive for decreased hearing.  All other systems are reviewed and negative.    Physical Exam: VS:  BP 136/86 mmHg  Pulse 61  Ht 5\' 6"  (1.676 m)  Wt 206 lb (93.441 kg)  BMI 33.27 kg/m2  SpO2 96%, BMI Body mass index is 33.27 kg/(m^2).  Wt Readings from Last 3 Encounters:  09/23/14 206 lb (93.441 kg)  03/18/14 203 lb (92.08 kg)  02/11/14 199 lb (90.266 kg)      Overweight male, appears comfortable at rest.  HEENT: Conjunctiva and lids normal, oropharynx clear.  Neck: Supple, no elevated JVP, prominent carotid pulsation at base of right neck with soft carotid bruits, no thyromegaly.  Lungs: Clear to auscultation, nonlabored breathing at rest.  Cardiac: Regular rate and rhythm, no S3, soft systolic murmur, no pericardial rub.  Abdomen: Soft, nontender, bowel sounds present, no guarding or rebound.  Extremities: Trace edema, distal pulses 1-2+.  Skin: Warm and dry.  Musculoskeletal: No kyphosis.  Neuropsychiatric: Alert and oriented x3, affect grossly appropriate. Hard of hearing.   ECG: ECG is not ordered today.  Recent Labwork: 11/23/2013: Platelets 90* 02/07/2014: BUN 18; Creatinine 1.23;  Hemoglobin 16.7; Potassium 4.2; Sodium 140 03/18/2014: ALT 36; AST 30     Component Value Date/Time   CHOL 171 03/18/2014 0908   TRIG 302* 03/18/2014 0908   HDL 33* 03/18/2014 0908   CHOLHDL 5.2 03/18/2014 0908   VLDL 60* 03/18/2014 0908   LDLCALC 78 03/18/2014 0908     ASSESSMENT AND PLAN:  1. Symptomatically stable with multivessel disease status post CABG, negative ischemic testing in 2012. Continue observation.  2. Hypertriglyceridemia, tolerating omega-3 supplements.  3. Carotid artery occlusion, nonobstructive bilaterally as of 2014. Follow-up carotid Dopplers will be obtained.  4. Essential hypertension, no change to current regimen. Keep follow-up with Dr. Luan Pulling.  Current medicines are reviewed at length with the patient today.   Orders Placed This Encounter  Procedures  . US Carotid Duplex Bilateral    Disposition: FU with me in 6 months.   Signed, [name], MD, [facility] 09/23/2014 11:53 AM    [place] at [facility] [address] [place], [place] [address] Phone: [phone]; Fax: [phone]

## 2014-09-23 NOTE — Patient Instructions (Signed)
Your physician wants you to follow-up in: 6 months with Dr.McDowell You will receive a reminder letter in the mail two months in advance. If you don't receive a letter, please call our office to schedule the follow-up appointment.   Your physician recommends that you continue on your current medications as directed. Please refer to the Current Medication list given to you today.    Your physician has requested that you have a carotid duplex. This test is an ultrasound of the carotid arteries in your neck. It looks at blood flow through these arteries that supply the brain with blood. Allow one hour for this exam. There are no restrictions or special instructions.     Thank you for choosing [facility] !         

## 2014-09-24 NOTE — Addendum Note (Signed)
Addended by: Levonne Hubert on: 09/24/2014 10:48 AM   Modules accepted: Orders

## 2014-09-25 ENCOUNTER — Ambulatory Visit (HOSPITAL_COMMUNITY)
Admission: RE | Admit: 2014-09-25 | Discharge: 2014-09-25 | Disposition: A | Discharge status: Home / Self Care | Payer: Medicare Other | Source: Ambulatory Visit | Attending: Cardiology | Admitting: Cardiology

## 2014-09-25 DIAGNOSIS — Z951 Presence of aortocoronary bypass graft: Secondary | ICD-10-CM | POA: Insufficient documentation

## 2014-09-25 DIAGNOSIS — I10 Essential (primary) hypertension: Secondary | ICD-10-CM | POA: Diagnosis not present

## 2014-09-25 DIAGNOSIS — I6523 Occlusion and stenosis of bilateral carotid arteries: Secondary | ICD-10-CM | POA: Insufficient documentation

## 2014-09-25 DIAGNOSIS — Z87891 Personal history of nicotine dependence: Secondary | ICD-10-CM | POA: Diagnosis not present

## 2014-09-25 DIAGNOSIS — I2510 Atherosclerotic heart disease of native coronary artery without angina pectoris: Secondary | ICD-10-CM | POA: Diagnosis not present

## 2014-10-21 DIAGNOSIS — I10 Essential (primary) hypertension: Secondary | ICD-10-CM | POA: Diagnosis not present

## 2014-10-21 DIAGNOSIS — I2510 Atherosclerotic heart disease of native coronary artery without angina pectoris: Secondary | ICD-10-CM | POA: Diagnosis not present

## 2014-10-21 DIAGNOSIS — G4730 Sleep apnea, unspecified: Secondary | ICD-10-CM | POA: Diagnosis not present

## 2014-10-21 DIAGNOSIS — R479 Unspecified speech disturbances: Secondary | ICD-10-CM | POA: Diagnosis not present

## 2014-11-06 ENCOUNTER — Ambulatory Visit (HOSPITAL_COMMUNITY): Payer: Medicare Other | Admitting: Speech Pathology

## 2014-11-07 ENCOUNTER — Ambulatory Visit (HOSPITAL_COMMUNITY): Payer: Medicare Other | Attending: Pulmonary Disease | Admitting: Speech Pathology

## 2014-11-07 DIAGNOSIS — R479 Unspecified speech disturbances: Secondary | ICD-10-CM | POA: Diagnosis not present

## 2014-11-07 NOTE — Therapy (Signed)
John [name], [place], [address] Phone: [phone]   Fax:  [phone]  Speech Language Pathology Evaluation  Patient Details  Name: John [name] MRN: [id number] Date of Birth: [birth year] Referring Provider:  Sinda Du, MD  Encounter Date: 11/07/2014      End of Session - 11/07/14 1727    Visit Number 1   Number of Visits 1   Authorization Type [employer] Medicare   SLP Start Time 7001   SLP Stop Time  1147   SLP Time Calculation (min) 45 min   Activity Tolerance Patient tolerated treatment well      Past Medical History  Diagnosis Date  . Malignant melanoma of skin   . Coronary atherosclerosis of native coronary artery     Multivessel status post CABG in 1992  . OSA (obstructive sleep apnea)   . Rosacea   . Mixed hyperlipidemia   . Nephrolithiasis   . Essential hypertension, benign     Past Surgical History  Procedure Laterality Date  . Total knee arthroplasty    . Hernia repair    . Coronary artery bypass graft  1992  . Melanoma excision      neck  . Cataract extraction w/phaco Right 02/11/2014    Procedure: CATARACT EXTRACTION PHACO AND INTRAOCULAR LENS PLACEMENT (IOC);  Surgeon: John Branch, MD;  Location: [facility];  Service: Ophthalmology;  Laterality: Right;  CDE 13.83    There were no vitals filed for this visit.  Visit Diagnosis: Speech disturbance      Subjective Assessment - 11/07/14 1721    Subjective "Sometimes it feels like I have marbles in my mouth."   Special Tests informal cogling measures and oral motor examination   Currently in Pain? No/denies            SLP Evaluation [facility] - 11/07/14 1721    SLP Visit Information   SLP Received On 11/07/14   Onset Date ~09/02/2013   Medical Diagnosis speech disturbance   Subjective   Subjective "Sometimes it feels like I have a mouthful of marbles."   Patient/Family Stated Goal "Figure out why my speech does that at times."    General Information   Other Pertinent Information [name] is an 81 yo male who was referred by Dr. Luan [name] for intermittent speech disturbance. He reports this started about a year ago when he was driving home from a baseball game.   Behavioral/Cognition WFL   Mobility Status Ambulatory   Prior Functional Status   Cognitive/Linguistic Baseline Within functional limits   Type of Home House    Lives With Spouse   Vocation Retired   Associate Professor   Overall Cognitive Status Within Functional Limits for tasks assessed   Memory Appears intact   Awareness Appears intact   Problem Solving Appears intact   Auditory Comprehension   Overall Auditory Comprehension Appears within functional limits for tasks assessed   Yes/No Questions Within Functional Limits   Commands Within Functional Limits   Conversation Moderately complex   Visual Recognition/Discrimination   Discrimination Within Function Limits   Reading Comprehension   Reading Status Not tested   Expression   Primary Mode of Expression Verbal   Verbal Expression   Overall Verbal Expression Appears within functional limits for tasks assessed   Initiation No impairment   Level of Generative/Spontaneous Verbalization Conversation   Repetition No impairment   Naming No impairment   Pragmatics No impairment   Non-Verbal Means of Communication  Not applicable   Written Expression   Written Expression Not tested   Oral Motor/Sensory Function   Overall Oral Motor/Sensory Function Appears within functional limits for tasks assessed   Labial ROM Within Functional Limits   Labial Strength Within Functional Limits   Labial Sensation Within Functional Limits   Lingual ROM Within Functional Limits   Lingual Symmetry Within Functional Limits   Lingual Strength Within Functional Limits   Lingual Sensation Within Functional Limits   Lingual Coordination WFL   Facial ROM Within Functional Limits   Facial Symmetry Within Functional  Limits   Facial Strength Within Functional Limits   Facial Sensation Within Functional Limits   Facial Coordination WFL   Velum Within Functional Limits   Mandible Within Functional Limits   Motor Speech   Overall Motor Speech Appears within functional limits for tasks assessed   Respiration Within functional limits   Phonation Normal   Resonance Within functional limits   Articulation Within functional limitis   Intelligibility Intelligible   Motor Planning Witnin functional limits   Motor Speech Errors Not applicable   Phonation WFL   Standardized Assessments   Standardized Assessments  Other Assessment  portions of and other informal cogling measures              SLP Education - 2014/11/13 1726    Education provided Yes   Education Details Speech disturbance strategies   Person(s) Educated Patient   Methods Explanation;Handout   Comprehension Verbalized understanding              Plan - 13-Nov-2014 1727    Clinical Impression Statement John [name] presents with normal speech, language, and cognitive function. Oral motor examination was [facility]. It is difficult to address John [name] complaints at this time, as he has not had an "episode" in over 2 weeks. He reports that he is unable to tell when an episode is coming on and that it can last from 30 minutes to 2 hours. The onset is sometimes gradual and sometimes sudden. He reports no other difficulties or changes (swallowing ok, thinking ok, balance ok, comprehension ok) when the episode happens. He has no difficulty thinking of the words, he reports that the words come out like he is unable to move his tongue. He does endorse dry mouth and he was given suggestions for increasing moisture/salivary production by using over the counter products/Oragel, Salivart, chewing sugarless gum, and sipping on water). When he has an episode, his mouth seems to be "wetter", but no difficulty swallowing. He states that he stopped drinking  coffee two weeks ago and wonders if this is why the episodes have stopped. SLP suggested that he keep a log/journal of when the episodes happen, how long they last, and what he experiences. He was also advised to look in the mirror and complete labio/lingual ROM and coordination movements to assess for any changes. He should speak slowly and overarticulate during these episodes to see if this helps. He states he is not bothered by these episodes, but wants to know why they happen. It does not appear that he has had an MRI, but CT was negative per patient. He is also followed by cardiology. No further intervention recommended at this time. SLP will call pt in a couple of weeks to see if he has had any further episodes.    Consulted and Agree with Plan of Care Patient          G-Codes - 11/13/2014 1728    Functional Assessment  Tool Used clinical judgment   Functional Limitations Motor speech   Motor Speech Current Status [phone]) 0 percent impaired, limited or restricted   Motor Speech Goal Status [phone]) 0 percent impaired, limited or restricted   Motor Speech Goal Status (C3818) 0 percent impaired, limited or restricted      Problem List Patient Active Problem List   Diagnosis Date Noted  . Coronary atherosclerosis of native coronary artery 09/17/2013  . Essential hypertension, benign 09/17/2013  . Mixed hyperlipidemia 09/17/2013    Thank you,  John [name], [place]   John [name] 11/07/2014, 5:29 PM  [place] [address] [place], [place], [address] Phone: [phone]   Fax:  [phone]

## 2014-11-22 ENCOUNTER — Telehealth: Payer: Self-pay | Admitting: Oncology

## 2014-11-22 ENCOUNTER — Ambulatory Visit (HOSPITAL_BASED_OUTPATIENT_CLINIC_OR_DEPARTMENT_OTHER): Payer: Medicare Other | Admitting: Oncology

## 2014-11-22 ENCOUNTER — Other Ambulatory Visit (HOSPITAL_BASED_OUTPATIENT_CLINIC_OR_DEPARTMENT_OTHER): Payer: Medicare Other

## 2014-11-22 VITALS — BP 167/70 | HR 56 | Temp 97.6°F | Resp 18 | Ht 66.0 in | Wt 208.3 lb

## 2014-11-22 DIAGNOSIS — Z85820 Personal history of malignant melanoma of skin: Secondary | ICD-10-CM

## 2014-11-22 DIAGNOSIS — C439 Malignant melanoma of skin, unspecified: Secondary | ICD-10-CM

## 2014-11-22 DIAGNOSIS — D696 Thrombocytopenia, unspecified: Secondary | ICD-10-CM

## 2014-11-22 LAB — COMPREHENSIVE METABOLIC PANEL (CC13)
ALT: 40 U/L (ref 0–55)
AST: 33 U/L (ref 5–34)
Albumin: 4.1 g/dL (ref 3.5–5.0)
Alkaline Phosphatase: 57 U/L (ref 40–150)
Anion Gap: 11 mEq/L (ref 3–11)
BUN: 14 mg/dL (ref 7.0–26.0)
CO2: 27 mEq/L (ref 22–29)
Calcium: 9.4 mg/dL (ref 8.4–10.4)
Chloride: 106 mEq/L (ref 98–109)
Creatinine: 1.1 mg/dL (ref 0.7–1.3)
EGFR: 63 mL/min/{1.73_m2} — ABNORMAL LOW (ref >90)
Glucose: 97 mg/dl (ref 70–140)
Potassium: 4 mEq/L (ref 3.5–5.1)
Sodium: 144 mEq/L (ref 136–145)
Total Bilirubin: 2.59 mg/dL — ABNORMAL HIGH (ref 0.20–1.20)
Total Protein: 6.9 g/dL (ref 6.4–8.3)

## 2014-11-22 LAB — CBC WITH DIFFERENTIAL/PLATELET
BASO%: 0.4 % (ref 0.0–2.0)
Basophils Absolute: 0 10*3/uL (ref 0.0–0.1)
EOS%: 4.8 % (ref 0.0–7.0)
Eosinophils Absolute: 0.2 10*3/uL (ref 0.0–0.5)
HCT: 46.1 % (ref 38.4–49.9)
HGB: 16.4 g/dL (ref 13.0–17.1)
LYMPH%: 45 % (ref 14.0–49.0)
MCH: 31.7 pg (ref 27.2–33.4)
MCHC: 35.6 g/dL (ref 32.0–36.0)
MCV: 89 fL (ref 79.3–98.0)
MONO#: 0.3 10*3/uL (ref 0.1–0.9)
MONO%: 6 % (ref 0.0–14.0)
NEUT#: 2.2 10*3/uL (ref 1.5–6.5)
NEUT%: 43.8 % (ref 39.0–75.0)
Platelets: 78 10*3/uL — ABNORMAL LOW (ref 140–400)
RBC: 5.18 10*6/uL (ref 4.20–5.82)
RDW: 12.9 % (ref 11.0–14.6)
WBC: 5 10*3/uL (ref 4.0–10.3)
lymph#: 2.3 10*3/uL (ref 0.9–3.3)

## 2014-11-22 NOTE — Telephone Encounter (Signed)
Gave and printed appt sched and avs fo rpt for May 2017 °

## 2014-11-22 NOTE — Progress Notes (Signed)
Hematology and Oncology Follow Up Visit  John [name] [id number] 1929/10/04 81 y.o. 11/22/2014 10:02 AM    CC: John [name] John [name], M.D. John H. Constance Holster, MD John [name]. John [name], M.D.    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  Prior Therapy: He is S/P wide excision and lymph node dissection.  Pathology revealed a Clark level III, 1.25-mm melanoma without ulceration or vascular invasion.  He had 0/21 lymph nodes involved.  Did not receive any adjuvant therapy.  Current therapy: Observation and surveillance.  Interim History:   John [name] presents today for a followup visit with his wife.  Since his last visit, he has not reported any major complaints. He has not reported any new neurological symptoms. His medications were changed with some improvement in his speech. He has nothing  to suggest a tumor relapse or recurrence.  He had not reported any chest pain, had not reported any abdominal pain, and had not reported any skin lesions.  Performance status activity level remains excellent at this point. He is reporting no bleeding or any new skin lesions. He continues to followup with dermatology on a regular basis. He has not reported any headaches or blurry vision or double vision. Has not reported any syncope. Did not report any chest pain or shortness of breath. Does not report any abdominal pain or early satiety. Did not report any leg edema or arthralgias. Rest of his review of system is unremarkable.  Medication reviewed without any changes. Current Outpatient Prescriptions  Medication Sig Dispense Refill  . amLODipine (NORVASC) 5 MG tablet Take 5 mg by mouth daily.    [name] aspirin EC 81 MG tablet Take 81 mg by mouth daily.    . metoprolol tartrate (LOPRESSOR) 25 MG tablet Take 25 mg by mouth 3 (three) times daily.    . Omega-3 Fatty Acids (FISH OIL) 1000 MG CAPS Take 1,000 mg by mouth 2 (two) times  daily.    . pantoprazole (PROTONIX) 40 MG tablet Take 40 mg by mouth daily.     No current facility-administered medications for this visit.     Allergies:  Allergies  Allergen Reactions  . Statins Other (See Comments)    Weak,nervous  . Tape Rash     Physical Exam: Blood pressure 167/70, pulse 56, temperature 97.6 F (36.4 C), temperature source Oral, resp. rate 18, height 5\' 6"  (1.676 m), weight 208 lb 4.8 oz (94.484 kg), SpO2 98 %. ECOG: 1 General appearance: alert awake not in any distress. His speech was adequate. Head: Normocephalic, without obvious abnormality, atraumatic Neck: no adenopathy, thyroid appeared normal the Lymph nodes: Cervical, supraclavicular, and axillary nodes normal. Heart:regular rate and rhythm, S1, S2.  Lung:chest clear, no wheezing, rales, normal symmetric air entry Abdomin: soft, non-tender, without masses or organomegaly EXT:no erythema, induration, or nodules Skin: No masses or lesions noted. No bruises noted.   Lab Results: Lab Results  Component Value Date   WBC 5.0 11/22/2014   HGB 16.4 11/22/2014   HCT 46.1 11/22/2014   MCV 89.0 11/22/2014   PLT 78* 11/22/2014     Chemistry      Component Value Date/Time   NA 140 02/07/2014 1320   NA 142 11/23/2013 0936   K 4.2 02/07/2014 1320   K 4.2 11/23/2013 0936   CL 104 02/07/2014 1320   CL 106 10/17/2012 0754   CO2 17* 02/07/2014 1320   CO2 25 11/23/2013 0936  BUN 18 02/07/2014 1320   BUN 14.5 11/23/2013 0936   CREATININE 1.23 02/07/2014 1320   CREATININE 1.2 11/23/2013 0936      Component Value Date/Time   CALCIUM 9.1 02/07/2014 1320   CALCIUM 9.4 11/23/2013 0936   ALKPHOS 44 03/18/2014 0908   ALKPHOS 46 11/23/2013 0936   AST 30 03/18/2014 0908   AST 28 11/23/2013 0936   ALT 36 03/18/2014 0908   ALT 27 11/23/2013 0936   BILITOT 2.4* 03/18/2014 0908   BILITOT 2.75* 11/23/2013 0936        Impression and Plan:   81 year old gentleman with the following  issues: 1. Stage IB melanoma diagnosed in August 2007, status post wide excision and a neck dissection.  He continues to have no evidence of any recurrent disease. He is currently under active surveillance from dermatology and I'll continue to see him on an annual basis. 2. Thrombocytopenia.  Continued to be rather mild and stable without any evidence of bleeding.  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  We will continue to monitor that. 3. Followup will be in 12 months.    Zola Button, MD 5/20/201610:02 AM

## 2014-12-17 ENCOUNTER — Other Ambulatory Visit: Payer: Self-pay | Admitting: Dermatology

## 2014-12-17 DIAGNOSIS — L570 Actinic keratosis: Secondary | ICD-10-CM | POA: Diagnosis not present

## 2014-12-17 DIAGNOSIS — C4442 Squamous cell carcinoma of skin of scalp and neck: Secondary | ICD-10-CM | POA: Diagnosis not present

## 2015-01-08 ENCOUNTER — Encounter: Payer: Self-pay | Admitting: *Deleted

## 2015-01-20 DIAGNOSIS — Z1211 Encounter for screening for malignant neoplasm of colon: Secondary | ICD-10-CM | POA: Diagnosis not present

## 2015-01-20 DIAGNOSIS — Z0000 Encounter for general adult medical examination without abnormal findings: Secondary | ICD-10-CM | POA: Diagnosis not present

## 2015-01-22 DIAGNOSIS — C439 Malignant melanoma of skin, unspecified: Secondary | ICD-10-CM | POA: Diagnosis not present

## 2015-01-22 DIAGNOSIS — I2510 Atherosclerotic heart disease of native coronary artery without angina pectoris: Secondary | ICD-10-CM | POA: Diagnosis not present

## 2015-01-22 DIAGNOSIS — N200 Calculus of kidney: Secondary | ICD-10-CM | POA: Diagnosis not present

## 2015-01-22 DIAGNOSIS — E785 Hyperlipidemia, unspecified: Secondary | ICD-10-CM | POA: Diagnosis not present

## 2015-01-22 DIAGNOSIS — Z0000 Encounter for general adult medical examination without abnormal findings: Secondary | ICD-10-CM | POA: Diagnosis not present

## 2015-01-28 DIAGNOSIS — Z85828 Personal history of other malignant neoplasm of skin: Secondary | ICD-10-CM | POA: Diagnosis not present

## 2015-01-28 DIAGNOSIS — L570 Actinic keratosis: Secondary | ICD-10-CM | POA: Diagnosis not present

## 2015-01-28 DIAGNOSIS — Z08 Encounter for follow-up examination after completed treatment for malignant neoplasm: Secondary | ICD-10-CM | POA: Diagnosis not present

## 2015-02-07 ENCOUNTER — Encounter: Payer: Self-pay | Admitting: Cardiovascular Disease

## 2015-02-10 ENCOUNTER — Encounter: Payer: Self-pay | Admitting: Cardiovascular Disease

## 2015-02-11 ENCOUNTER — Encounter: Payer: Self-pay | Admitting: Cardiology

## 2015-03-24 ENCOUNTER — Encounter: Payer: Self-pay | Admitting: Cardiology

## 2015-03-24 ENCOUNTER — Ambulatory Visit (INDEPENDENT_AMBULATORY_CARE_PROVIDER_SITE_OTHER): Payer: Medicare Other | Admitting: Cardiology

## 2015-03-24 VITALS — BP 126/78 | HR 66 | Ht 71.0 in | Wt 209.0 lb

## 2015-03-24 DIAGNOSIS — I2510 Atherosclerotic heart disease of native coronary artery without angina pectoris: Secondary | ICD-10-CM | POA: Diagnosis not present

## 2015-03-24 DIAGNOSIS — I6523 Occlusion and stenosis of bilateral carotid arteries: Secondary | ICD-10-CM | POA: Diagnosis not present

## 2015-03-24 DIAGNOSIS — I10 Essential (primary) hypertension: Secondary | ICD-10-CM

## 2015-03-24 DIAGNOSIS — E782 Mixed hyperlipidemia: Secondary | ICD-10-CM | POA: Diagnosis not present

## 2015-03-24 NOTE — Progress Notes (Signed)
Cardiology Office Note  Date: 03/24/2015   ID: John [name], DOB 02-Jun-1930, MRN [id number]  PCP: Alonza Bogus, MD  Primary Cardiologist: [name], MD   Chief Complaint  Patient presents with  . Coronary Artery Disease    History of Present Illness: John [name] is an 81 y.o. male last seen in March. He presents for a routine follow-up visit. No angina symptoms or nitroglycerin use. States he gets tired in the afternoon after working all day. We talked about taking breaks during the day. Reports NYHA class II dyspnea, no palpitations or syncope.  We reviewed his medications. He had recent lab work with Dr. Luan Pulling.  Carotid Dopplers from earlier in the year or reviewed below.  Past Medical History  Diagnosis Date  . Malignant melanoma of skin   . Coronary atherosclerosis of native coronary artery     Multivessel status post CABG in 1992  . OSA (obstructive sleep apnea)   . Rosacea   . Mixed hyperlipidemia   . Nephrolithiasis   . Essential hypertension, benign     Current Outpatient Prescriptions  Medication Sig Dispense Refill  . amLODipine (NORVASC) 5 MG tablet Take 5 mg by mouth daily.    [name] aspirin EC 81 MG tablet Take 81 mg by mouth daily.    . benazepril (LOTENSIN) 10 MG tablet Take 10 mg by mouth daily.    . metoprolol tartrate (LOPRESSOR) 25 MG tablet Take 25 mg by mouth 3 (three) times daily.    . Omega-3 Fatty Acids (FISH OIL) 1000 MG CAPS Take 1,000 mg by mouth 2 (two) times daily.    . pantoprazole (PROTONIX) 40 MG tablet Take 40 mg by mouth daily.     No current facility-administered medications for this visit.    Allergies:  Statins and Tape   Social History: The patient  reports that he quit smoking about 33 years ago. His smoking use included Cigarettes. He started smoking about 71 years ago. He has never used smokeless tobacco. He reports that he does not drink alcohol or use illicit drugs.   ROS:  Please see the history of present  illness. Otherwise, complete review of systems is positive for decreased hearing.  All other systems are reviewed and negative.   Physical Exam: VS:  BP 126/78 mmHg  Pulse 66  Ht 5\' 11"  (1.803 m)  Wt 209 lb (94.802 kg)  BMI 29.16 kg/m2  SpO2 98%, BMI Body mass index is 29.16 kg/(m^2).  Wt Readings from Last 3 Encounters:  03/24/15 209 lb (94.802 kg)  11/22/14 208 lb 4.8 oz (94.484 kg)  09/23/14 206 lb (93.441 kg)     General: Patient appears comfortable at rest. HEENT: Conjunctiva and lids normal, oropharynx clear with moist mucosa. Neck: Supple, no elevated JVP or carotid bruits, no thyromegaly. Lungs: Clear to auscultation, nonlabored breathing at rest. Cardiac: Regular rate and rhythm, no S3 or significant systolic murmur, no pericardial rub. Abdomen: Soft, nontender, no hepatomegaly, bowel sounds present, no guarding or rebound. Extremities: No pitting edema, distal pulses 2+. Skin: Warm and dry. Musculoskeletal: No kyphosis. Neuropsychiatric: Alert and oriented x3, affect grossly appropriate.   ECG: ECG is not ordered today.  Recent Labwork: 11/22/2014: ALT 40; AST 33; BUN 14.0; Creatinine 1.1; HGB 16.4; Platelets 78*; Potassium 4.0; Sodium 144     Component Value Date/Time   CHOL 171 03/18/2014 0908   TRIG 302* 03/18/2014 0908   HDL 33* 03/18/2014 0908   CHOLHDL 5.2 03/18/2014 0908  VLDL 60* 03/18/2014 0908   LDLCALC 78 03/18/2014 0908    Other Studies Reviewed Today:  Carotid Dopplers 09/25/2014: IMPRESSION: 1. Moderate to large amount of left-sided atherosclerotic plaque does not result in elevated peak systolic velocities within the left internal carotid artery though morphologically results in at least 50% luminal narrowing. Further evaluation with CTA could be performed as clinically indicated. 2. Minimal amount of right-sided atherosclerotic plaque, not resulting in a hemodynamically significant stenosis. 3. Incidentally noted marked tortuosity  involving the proximal and mid aspects of the right common carotid artery.  ASSESSMENT AND PLAN:  1. Multivessel CAD status post previous CABG, symptomatically stable on medical therapy with negative ischemic workup in 2012. Continue medical therapy and observation.  2. Hyperlipidemia, on omega-3 supplements. He follows with Dr. Luan Pulling.  3. Essential hypertension, no changes made to current regimen.  4. Carotid artery disease, less than 50%, continue medical therapy.  Current medicines were reviewed at length with the patient today.  Disposition: FU with me in 6 months.   Signed, [name], MD, [facility] 03/24/2015 10:31 AM    [place] at [place]. [address] [place], [place] [address] Phone: [phone]; Fax: [phone]

## 2015-03-24 NOTE — Patient Instructions (Signed)
Your physician wants you to follow-up in: 6 months with Dr.McDowell You will receive a reminder letter in the mail two months in advance. If you don't receive a letter, please call our office to schedule the follow-up appointment.     Your physician recommends that you continue on your current medications as directed. Please refer to the Current Medication list given to you today.     Thank you for choosing [facility] !        

## 2015-04-22 DIAGNOSIS — R490 Dysphonia: Secondary | ICD-10-CM | POA: Diagnosis not present

## 2015-04-22 DIAGNOSIS — I2510 Atherosclerotic heart disease of native coronary artery without angina pectoris: Secondary | ICD-10-CM | POA: Diagnosis not present

## 2015-04-22 DIAGNOSIS — N401 Enlarged prostate with lower urinary tract symptoms: Secondary | ICD-10-CM | POA: Diagnosis not present

## 2015-04-22 DIAGNOSIS — I10 Essential (primary) hypertension: Secondary | ICD-10-CM | POA: Diagnosis not present

## 2015-04-22 DIAGNOSIS — Z23 Encounter for immunization: Secondary | ICD-10-CM | POA: Diagnosis not present

## 2015-05-01 DIAGNOSIS — L570 Actinic keratosis: Secondary | ICD-10-CM | POA: Diagnosis not present

## 2015-05-01 DIAGNOSIS — Z85828 Personal history of other malignant neoplasm of skin: Secondary | ICD-10-CM | POA: Diagnosis not present

## 2015-05-01 DIAGNOSIS — Z08 Encounter for follow-up examination after completed treatment for malignant neoplasm: Secondary | ICD-10-CM | POA: Diagnosis not present

## 2015-05-20 ENCOUNTER — Telehealth: Payer: Self-pay | Admitting: Cardiovascular Disease

## 2015-05-20 ENCOUNTER — Telehealth: Payer: Self-pay

## 2015-05-20 DIAGNOSIS — I491 Atrial premature depolarization: Secondary | ICD-10-CM | POA: Diagnosis not present

## 2015-05-20 DIAGNOSIS — I10 Essential (primary) hypertension: Secondary | ICD-10-CM | POA: Diagnosis not present

## 2015-05-20 DIAGNOSIS — I493 Ventricular premature depolarization: Secondary | ICD-10-CM | POA: Diagnosis not present

## 2015-05-20 DIAGNOSIS — I2510 Atherosclerotic heart disease of native coronary artery without angina pectoris: Secondary | ICD-10-CM | POA: Diagnosis not present

## 2015-05-20 NOTE — Telephone Encounter (Signed)
Called by Dr. Luan Pulling to discuss patient. Normally follows with Dr. Domenic Polite, who saw him in September.  Has had palpitations for past few days. ECG in office reportedly shows PAC's/PVC's without ischemic changes. Currently taking metoprolol tartrate 25 mg tid.  I have recommended serum K and Mg be checked, and switching him to Toprol-XL 50 mg bid.  As per his request, will arrange for close follow up in our office.

## 2015-05-20 NOTE — Telephone Encounter (Signed)
Pt states that he feels his heart is skipping beats. He has not had any cp or sob. He did not have any rapid pulse nor had he taken his blood pressure lately. I suggested that he go to ED if he feels that he cannot wait until tomorrow as we have scheduled him an appointment with Dr.McDowell @ 3:40. He states that he will call his PCP and see if he can see him today.

## 2015-05-21 ENCOUNTER — Encounter: Payer: Self-pay | Admitting: Cardiology

## 2015-05-21 ENCOUNTER — Ambulatory Visit (INDEPENDENT_AMBULATORY_CARE_PROVIDER_SITE_OTHER): Payer: Medicare Other | Admitting: Cardiology

## 2015-05-21 VITALS — BP 128/74 | HR 64 | Ht 66.0 in | Wt 200.0 lb

## 2015-05-21 DIAGNOSIS — I2510 Atherosclerotic heart disease of native coronary artery without angina pectoris: Secondary | ICD-10-CM

## 2015-05-21 DIAGNOSIS — I493 Ventricular premature depolarization: Secondary | ICD-10-CM

## 2015-05-21 NOTE — Patient Instructions (Signed)
Medication Instructions:  Your physician recommends that you continue on your current medications as directed. Please refer to the Current Medication list given to you today.   Labwork: NONE  Testing/Procedures: NONE  Follow-Up: Your physician wants you to follow-up in: [place]. You will receive a reminder letter in the mail two months in advance. If you don't receive a letter, please call our office to schedule the follow-up appointment.   Any Other Special Instructions Will Be Listed Below (If Applicable).  Thanks for choosing [facility]!!!       If you need a refill on your cardiac medications before your next appointment, please call your pharmacy.

## 2015-05-21 NOTE — Progress Notes (Signed)
Cardiology Office Note  Date: 05/21/2015   ID: John [name], DOB Nov 30, 1929, MRN [id number]  PCP: Alonza Bogus, MD  Primary Cardiologist: [name], MD   Chief Complaint  Patient presents with  . Coronary Artery Disease  . Palpitations    History of Present Illness: John [name] is an 81 y.o. male last seen in September. He presents for a follow-up visit. Chart reviewed. Patient recently called to the office discussing palpitations. He was seen by Dr. Luan Pulling with ECG reportedly showing PACs and PVCs. He was started on Toprol-XL 50 mg twice daily by Dr. Bronson Ing, also was to have lab work obtained. He comes in today telling me that he has felt "skips" recently, usually at nighttime when he is still. He has not had any associated chest pain, breathlessness, or syncope. He has had trouble with this in the past. Otherwise reports compliance with his medications.  Myoview from August 2012 was described as low risk and without active ischemia.   Since starting the Toprol-XL he has been less aware of skips, although does have PVCs on examination today. Today we talked about continuing observation since he does not have any other associated symptoms. Also requesting electrolytes from Dr. Luan Pulling' recent blood work.   Past Medical History  Diagnosis Date  . Malignant melanoma of skin   . Coronary atherosclerosis of native coronary artery     Multivessel status post CABG in 1992  . OSA (obstructive sleep apnea)   . Rosacea   . Mixed hyperlipidemia   . Nephrolithiasis   . Essential hypertension, benign     Current Outpatient Prescriptions  Medication Sig Dispense Refill  . amLODipine (NORVASC) 5 MG tablet Take 5 mg by mouth daily.    [name] aspirin EC 81 MG tablet Take 81 mg by mouth daily.    . benazepril (LOTENSIN) 10 MG tablet Take 10 mg by mouth daily.    . metoprolol succinate (TOPROL-XL) 50 MG 24 hr tablet     . Omega-3 Fatty Acids (FISH OIL) 1000 MG CAPS Take  1,000 mg by mouth 2 (two) times daily.    . pantoprazole (PROTONIX) 40 MG tablet Take 40 mg by mouth daily.     No current facility-administered medications for this visit.    Allergies:  Statins and Tape   Social History: The patient  reports that he quit smoking about 33 years ago. His smoking use included Cigarettes. He started smoking about 71 years ago. He has never used smokeless tobacco. He reports that he does not drink alcohol or use illicit drugs.   ROS:  Please see the history of present illness. Otherwise, complete review of systems is positive for decreased hearing.  All other systems are reviewed and negative.   Physical Exam: VS:  BP 128/74 mmHg  Pulse 64  Ht 5\' 6"  (1.676 m)  Wt 200 lb (90.719 kg)  BMI 32.30 kg/m2  SpO2 96%, BMI Body mass index is 32.3 kg/(m^2).  Wt Readings from Last 3 Encounters:  05/21/15 200 lb (90.719 kg)  03/24/15 209 lb (94.802 kg)  11/22/14 208 lb 4.8 oz (94.484 kg)     General: Patient appears comfortable at rest. HEENT: Conjunctiva and lids normal, oropharynx clear. Neck: Supple, no elevated JVP or carotid bruits, no thyromegaly. Lungs: Clear to auscultation, nonlabored breathing at rest. Cardiac: Regular rate and rhythm with ectopy, no S3, soft systollic murmur. Abdomen: Soft, nontender, bowel sounds present, no guarding or rebound. Extremities: No pitting edema, distal pulses  2+.  ECG: ECG is not ordered today.  Recent Labwork: 11/22/2014: ALT 40; AST 33; BUN 14.0; Creatinine 1.1; HGB 16.4; Platelets 78*; Potassium 4.0; Sodium 144     Component Value Date/Time   CHOL 171 03/18/2014 0908   TRIG 302* 03/18/2014 0908   HDL 33* 03/18/2014 0908   CHOLHDL 5.2 03/18/2014 0908   VLDL 60* 03/18/2014 0908   LDLCALC 78 03/18/2014 0908    ASSESSMENT AND PLAN:  1. Recent sense of palpitations, documented PACs and PVCs per Dr. Luan Pulling' ECG. Agree with advancement of beta blocker dosing for now and observation. He does not report any other  associated symptoms such as chest pain, shortness of breath, or syncope. If his symptoms worsen will evaluate further. Otherwise keep regular follow-up. We are requesting his recent electrolytes.  2. CAD status post CABG in 1992, negative ischemic workup within the last 5 years.  Current medicines were reviewed at length with the patient today.  Disposition: FU with me in 6 months.   Signed, [name], MD, [facility] 05/21/2015 3:58 PM    [place] at [facility] [address], [place], [place] [address] Phone: [phone]; Fax: [phone]

## 2015-07-07 DIAGNOSIS — H25092 Other age-related incipient cataract, left eye: Secondary | ICD-10-CM | POA: Diagnosis not present

## 2015-07-07 DIAGNOSIS — H52223 Regular astigmatism, bilateral: Secondary | ICD-10-CM | POA: Diagnosis not present

## 2015-07-07 DIAGNOSIS — Z961 Presence of intraocular lens: Secondary | ICD-10-CM | POA: Diagnosis not present

## 2015-07-23 DIAGNOSIS — Z1211 Encounter for screening for malignant neoplasm of colon: Secondary | ICD-10-CM | POA: Diagnosis not present

## 2015-07-23 DIAGNOSIS — Z0000 Encounter for general adult medical examination without abnormal findings: Secondary | ICD-10-CM | POA: Diagnosis not present

## 2015-08-12 ENCOUNTER — Encounter: Payer: Self-pay | Admitting: Cardiology

## 2015-09-29 ENCOUNTER — Ambulatory Visit: Payer: Medicare Other | Admitting: Cardiology

## 2015-10-02 ENCOUNTER — Encounter: Payer: Self-pay | Admitting: Cardiology

## 2015-10-02 ENCOUNTER — Ambulatory Visit (INDEPENDENT_AMBULATORY_CARE_PROVIDER_SITE_OTHER): Payer: Medicare Other | Admitting: Cardiology

## 2015-10-02 VITALS — BP 118/70 | HR 58 | Ht 71.0 in | Wt 207.0 lb

## 2015-10-02 DIAGNOSIS — E782 Mixed hyperlipidemia: Secondary | ICD-10-CM

## 2015-10-02 DIAGNOSIS — I10 Essential (primary) hypertension: Secondary | ICD-10-CM

## 2015-10-02 DIAGNOSIS — I2510 Atherosclerotic heart disease of native coronary artery without angina pectoris: Secondary | ICD-10-CM | POA: Diagnosis not present

## 2015-10-02 DIAGNOSIS — I779 Disorder of arteries and arterioles, unspecified: Secondary | ICD-10-CM

## 2015-10-02 DIAGNOSIS — I493 Ventricular premature depolarization: Secondary | ICD-10-CM

## 2015-10-02 DIAGNOSIS — I739 Peripheral vascular disease, unspecified: Secondary | ICD-10-CM

## 2015-10-02 NOTE — Progress Notes (Signed)
Cardiology Office Note  Date: 10/02/2015   ID: MANSUR SANDUSKY, DOB 1929-07-21, MRN [id number]  PCP: Alonza Bogus, MD  Primary Cardiologist: [name], MD   Chief Complaint  Patient presents with  . Coronary Artery Disease    History of Present Illness: John [name] is an 82 y.o. male last seen in November 2016. He presents for a routine follow-up visit. He does not report any angina symptoms on current medical regimen. He has NYHA class II dyspnea. The previous "skips" and he was feeling around last assessment of her improved on current dose of Toprol-XL.  I reviewed his ECG today which showed sinus bradycardia with prolonged PR interval, Q wave in lead 3.  He had carotid Dopplers last year, results outlined below. He is due for a follow-up study between discussed today.  He had lab work physical with Dr. Luan Pulling back in January, I am requesting the results for review. He does not report any problems with his current medications.  Last stress test was in 2012 as detailed below. We did talk about updating this around the time of his next visit. He has had no major changes in symptoms.  Past Medical History  Diagnosis Date  . Malignant melanoma of skin   . Coronary atherosclerosis of native coronary artery     Multivessel status post CABG in 1992  . OSA (obstructive sleep apnea)   . Rosacea   . Mixed hyperlipidemia   . Nephrolithiasis   . Essential hypertension, benign     Past Surgical History  Procedure Laterality Date  . Total knee arthroplasty    . Hernia repair    . Coronary artery bypass graft  1992  . Melanoma excision      neck  . Cataract extraction w/phaco Right 02/11/2014    Procedure: CATARACT EXTRACTION PHACO AND INTRAOCULAR LENS PLACEMENT (IOC);  Surgeon: [name], MD;  Location: [facility];  Service: Ophthalmology;  Laterality: Right;  CDE 13.83  . Doppler echocardiography  05/23/2007    Left ventricular systolic function is normal. The  transmitral spectral doppler flow pattern is suggestive of impaired LV relaxation. No significant valvular abnormalities.  . Nm myocar perf ejection fraction  02/10/2011    The post stress myocaardial perfusion images show a normal pattern of perfusion in all regions. No significant wall motion abnormalities noted. EKG is negative for ischemia. This is a low risk scan.  . Cardiac catheterization  07/11/90  . Carotid duplex  06/24/2011, 05/12/2009    Right and Left ICAs: Demonstrates a small amount of irregular mixed density plaque with no evidence of diameter reduction, significant tortuosity or any other vascular abnormality. This is a mildly abnormal carotid duplex doppler evaluation.  [name] Heart monitor  04/01/2009    palpitations    Current Outpatient Prescriptions  Medication Sig Dispense Refill  . pantoprazole (PROTONIX) 40 MG tablet Take 40 mg by mouth daily.    [name] amLODipine (NORVASC) 5 MG tablet Take 5 mg by mouth daily.    [name] aspirin EC 81 MG tablet Take 81 mg by mouth daily.    . metoprolol succinate (TOPROL-XL) 50 MG 24 hr tablet Take 50 mg by mouth 2 (two) times daily.     . tamsulosin (FLOMAX) 0.4 MG CAPS capsule Take 0.4 mg by mouth daily.      No current facility-administered medications for this visit.   Allergies:  Statins and Tape   Social History: The patient  reports that he quit smoking about  34 years ago. His smoking use included Cigarettes. He started smoking about 72 years ago. He has never used smokeless tobacco. He reports that he does not drink alcohol or use illicit drugs.   ROS:  Please see the history of present illness. Otherwise, complete review of systems is positive for decreased hearing.  All other systems are reviewed and negative.   Physical Exam: VS:  BP 118/70 mmHg  Pulse 58  Ht 5\' 11"  (1.803 m)  Wt 207 lb (93.895 kg)  BMI 28.88 kg/m2, BMI Body mass index is 28.88 kg/(m^2).  Wt Readings from Last 3 Encounters:  10/02/15 207 lb (93.895 kg)  05/21/15 200  lb (90.719 kg)  03/24/15 209 lb (94.802 kg)    General: Patient appears comfortable at rest. HEENT: Conjunctiva and lids normal, oropharynx clear. Neck: Supple, no elevated JVP or carotid bruits, no thyromegaly. Lungs: Clear to auscultation, nonlabored breathing at rest. Cardiac: Regular rate and rhythm with ectopy, no S3, soft systollic murmur. Abdomen: Soft, nontender, bowel sounds present, no guarding or rebound. Extremities: No pitting edema, distal pulses 2+. Skin: Warm and dry. Musculoskeletal: No kyphosis. Neuropsychiatric: Alert and oriented 3, affect appropriate.  ECG: I personally reviewed the prior tracing from 09/18/2013 which showed sinus rhythm with prolonged PR interval.  Recent Labwork: 11/22/2014: ALT 40; AST 33; BUN 14.0; Creatinine 1.1; HGB 16.4; Platelets 78*; Potassium 4.0; Sodium 144     Component Value Date/Time   CHOL 171 03/18/2014 0908   TRIG 302* 03/18/2014 0908   HDL 33* 03/18/2014 0908   CHOLHDL 5.2 03/18/2014 0908   VLDL 60* 03/18/2014 0908   LDLCALC 78 03/18/2014 0908    Other Studies Reviewed Today:  Carotid Dopplers 09/25/2014: IMPRESSION: 1. Moderate to large amount of left-sided atherosclerotic plaque does not result in elevated peak systolic velocities within the left internal carotid artery though morphologically results in at least 50% luminal narrowing. Further evaluation with CTA could be performed as clinically indicated. 2. Minimal amount of right-sided atherosclerotic plaque, not resulting in a hemodynamically significant stenosis. 3. Incidentally noted marked tortuosity involving the proximal and mid aspects of the right common carotid artery.  Persantine Myoview 02/10/2011: No diagnostic ST segment changes. Normal LVEF with no ischemic changes.  Assessment and Plan:  1. Multivessel CAD status post CABG in 1992. Stress testing from 2012 was low risk. He does not endorse any angina symptoms on current regimen. I reviewed his ECG as  well. We discussed updating his stress test around the time of his next visit for ischemic surveillance.  2. Bilateral carotid artery disease, mostly left-sided although not obstructive. We will follow-up carotid Dopplers at this time.  3. History of hyperlipidemia, LDL was 78 by last evaluation. Requesting most recent lab work from Dr. Luan Pulling.  4. Essential hypertension, on Lotensin and Toprol-XL. Blood pressure is well controlled today.  5. Symptomatic PVCs, better after increasing dose of Toprol-XL.  Current medicines were reviewed with the patient today.   Orders Placed This Encounter  Procedures  . US Carotid Duplex Bilateral  . EKG 12-Lead    Disposition: FU with me in 6 months.   Signed, [name], MD, [facility] 10/02/2015 10:55 AM    [place] at [facility] [address], [place],  [address] Phone: [phone]; Fax: [phone]

## 2015-10-02 NOTE — Patient Instructions (Signed)
Your physician wants you to follow-up in: 6 months You will receive a reminder letter in the mail two months in advance. If you don't receive a letter, please call our office to schedule the follow-up appointment.   Your physician recommends that you continue on your current medications as directed. Please refer to the Current Medication list given to you today.   Your physician has requested that you have a carotid duplex. This test is an ultrasound of the carotid arteries in your neck. It looks at blood flow through these arteries that supply the brain with blood. Allow one hour for this exam. There are no restrictions or special instructions.       Thank you for choosing [facility] !        

## 2015-10-08 ENCOUNTER — Ambulatory Visit (HOSPITAL_COMMUNITY)
Admission: RE | Admit: 2015-10-08 | Discharge: 2015-10-08 | Disposition: A | Discharge status: Home / Self Care | Payer: Medicare Other | Source: Ambulatory Visit | Attending: Cardiology | Admitting: Cardiology

## 2015-10-08 DIAGNOSIS — I499 Cardiac arrhythmia, unspecified: Secondary | ICD-10-CM | POA: Diagnosis not present

## 2015-10-08 DIAGNOSIS — I779 Disorder of arteries and arterioles, unspecified: Secondary | ICD-10-CM | POA: Diagnosis not present

## 2015-10-08 DIAGNOSIS — I739 Peripheral vascular disease, unspecified: Secondary | ICD-10-CM

## 2015-10-08 DIAGNOSIS — I6523 Occlusion and stenosis of bilateral carotid arteries: Secondary | ICD-10-CM | POA: Diagnosis not present

## 2015-10-22 DIAGNOSIS — I493 Ventricular premature depolarization: Secondary | ICD-10-CM | POA: Diagnosis not present

## 2015-10-22 DIAGNOSIS — E162 Hypoglycemia, unspecified: Secondary | ICD-10-CM | POA: Diagnosis not present

## 2015-10-22 DIAGNOSIS — I2510 Atherosclerotic heart disease of native coronary artery without angina pectoris: Secondary | ICD-10-CM | POA: Diagnosis not present

## 2015-10-22 DIAGNOSIS — I10 Essential (primary) hypertension: Secondary | ICD-10-CM | POA: Diagnosis not present

## 2015-10-30 DIAGNOSIS — L814 Other melanin hyperpigmentation: Secondary | ICD-10-CM | POA: Diagnosis not present

## 2015-10-30 DIAGNOSIS — L821 Other seborrheic keratosis: Secondary | ICD-10-CM | POA: Diagnosis not present

## 2015-10-30 DIAGNOSIS — L570 Actinic keratosis: Secondary | ICD-10-CM | POA: Diagnosis not present

## 2015-11-11 ENCOUNTER — Encounter (HOSPITAL_COMMUNITY): Payer: Self-pay

## 2015-11-11 ENCOUNTER — Emergency Department (HOSPITAL_COMMUNITY): Payer: Medicare Other

## 2015-11-11 ENCOUNTER — Encounter (HOSPITAL_COMMUNITY)
Admission: EM | Disposition: A | Discharge status: Home / Self Care | Payer: Self-pay | Source: Home / Self Care | Attending: Cardiovascular Disease

## 2015-11-11 ENCOUNTER — Inpatient Hospital Stay (HOSPITAL_COMMUNITY)
Admission: EM | Admit: 2015-11-11 | Discharge: 2015-11-16 | DRG: 216 | Disposition: A | Discharge status: Home / Self Care | Payer: Medicare Other | Attending: Cardiovascular Disease | Admitting: Cardiovascular Disease

## 2015-11-11 DIAGNOSIS — Z87891 Personal history of nicotine dependence: Secondary | ICD-10-CM | POA: Diagnosis not present

## 2015-11-11 DIAGNOSIS — E782 Mixed hyperlipidemia: Secondary | ICD-10-CM | POA: Diagnosis not present

## 2015-11-11 DIAGNOSIS — Z955 Presence of coronary angioplasty implant and graft: Secondary | ICD-10-CM

## 2015-11-11 DIAGNOSIS — Z96659 Presence of unspecified artificial knee joint: Secondary | ICD-10-CM | POA: Diagnosis present

## 2015-11-11 DIAGNOSIS — I25810 Atherosclerosis of coronary artery bypass graft(s) without angina pectoris: Secondary | ICD-10-CM | POA: Diagnosis not present

## 2015-11-11 DIAGNOSIS — I110 Hypertensive heart disease with heart failure: Secondary | ICD-10-CM | POA: Diagnosis present

## 2015-11-11 DIAGNOSIS — I2129 ST elevation (STEMI) myocardial infarction involving other sites: Secondary | ICD-10-CM | POA: Diagnosis not present

## 2015-11-11 DIAGNOSIS — I2510 Atherosclerotic heart disease of native coronary artery without angina pectoris: Secondary | ICD-10-CM | POA: Diagnosis present

## 2015-11-11 DIAGNOSIS — Z85820 Personal history of malignant melanoma of skin: Secondary | ICD-10-CM

## 2015-11-11 DIAGNOSIS — I213 ST elevation (STEMI) myocardial infarction of unspecified site: Secondary | ICD-10-CM

## 2015-11-11 DIAGNOSIS — R0902 Hypoxemia: Secondary | ICD-10-CM | POA: Diagnosis not present

## 2015-11-11 DIAGNOSIS — I10 Essential (primary) hypertension: Secondary | ICD-10-CM | POA: Diagnosis present

## 2015-11-11 DIAGNOSIS — E785 Hyperlipidemia, unspecified: Secondary | ICD-10-CM | POA: Diagnosis not present

## 2015-11-11 DIAGNOSIS — I2582 Chronic total occlusion of coronary artery: Secondary | ICD-10-CM | POA: Diagnosis present

## 2015-11-11 DIAGNOSIS — R0602 Shortness of breath: Secondary | ICD-10-CM | POA: Diagnosis not present

## 2015-11-11 DIAGNOSIS — I5023 Acute on chronic systolic (congestive) heart failure: Secondary | ICD-10-CM | POA: Diagnosis present

## 2015-11-11 DIAGNOSIS — G4733 Obstructive sleep apnea (adult) (pediatric): Secondary | ICD-10-CM | POA: Diagnosis present

## 2015-11-11 DIAGNOSIS — N400 Enlarged prostate without lower urinary tract symptoms: Secondary | ICD-10-CM | POA: Diagnosis present

## 2015-11-11 DIAGNOSIS — I509 Heart failure, unspecified: Secondary | ICD-10-CM | POA: Diagnosis not present

## 2015-11-11 DIAGNOSIS — R069 Unspecified abnormalities of breathing: Secondary | ICD-10-CM | POA: Diagnosis not present

## 2015-11-11 DIAGNOSIS — I25111 Atherosclerotic heart disease of native coronary artery with angina pectoris with documented spasm: Secondary | ICD-10-CM | POA: Diagnosis not present

## 2015-11-11 DIAGNOSIS — I2102 ST elevation (STEMI) myocardial infarction involving left anterior descending coronary artery: Secondary | ICD-10-CM | POA: Diagnosis not present

## 2015-11-11 HISTORY — PX: CARDIAC CATHETERIZATION: SHX172

## 2015-11-11 HISTORY — DX: ST elevation (STEMI) myocardial infarction involving other sites: I21.29

## 2015-11-11 LAB — CBC
HCT: 50 % (ref 39.0–52.0)
Hemoglobin: 17.1 g/dL — ABNORMAL HIGH (ref 13.0–17.0)
MCH: 30.9 pg (ref 26.0–34.0)
MCHC: 34.2 g/dL (ref 30.0–36.0)
MCV: 90.4 fL (ref 78.0–100.0)
Platelets: 98 10*3/uL — ABNORMAL LOW (ref 150–400)
RBC: 5.53 MIL/uL (ref 4.22–5.81)
RDW: 13.2 % (ref 11.5–15.5)
WBC: 8.5 10*3/uL (ref 4.0–10.5)

## 2015-11-11 LAB — BASIC METABOLIC PANEL
Anion gap: 12 (ref 5–15)
BUN: 19 mg/dL (ref 6–20)
CO2: 22 mmol/L (ref 22–32)
Calcium: 9.2 mg/dL (ref 8.9–10.3)
Chloride: 105 mmol/L (ref 101–111)
Creatinine, Ser: 1.21 mg/dL (ref 0.61–1.24)
GFR calc Af Amer: >60 mL/min (ref >60)
GFR calc non Af Amer: 54 mL/min — ABNORMAL LOW (ref >60)
Glucose, Bld: 121 mg/dL — ABNORMAL HIGH (ref 65–99)
Potassium: 3.9 mmol/L (ref 3.5–5.1)
Sodium: 139 mmol/L (ref 135–145)

## 2015-11-11 LAB — TROPONIN I
Troponin I: 20.14 ng/mL (ref <0.031)
Troponin I: 25.59 ng/mL (ref <0.031)
Troponin I: 27.18 ng/mL (ref <0.031)

## 2015-11-11 LAB — CREATININE, SERUM
Creatinine, Ser: 1.29 mg/dL — ABNORMAL HIGH (ref 0.61–1.24)
GFR calc Af Amer: 57 mL/min — ABNORMAL LOW (ref >60)
GFR calc non Af Amer: 50 mL/min — ABNORMAL LOW (ref >60)

## 2015-11-11 LAB — MRSA PCR SCREENING: MRSA by PCR: NEGATIVE

## 2015-11-11 LAB — CBC WITH DIFFERENTIAL/PLATELET
Basophils Absolute: 0 10*3/uL (ref 0.0–0.1)
Basophils Relative: 0 %
Eosinophils Absolute: 0.1 10*3/uL (ref 0.0–0.7)
Eosinophils Relative: 1 %
HCT: 49.7 % (ref 39.0–52.0)
Hemoglobin: 17.5 g/dL — ABNORMAL HIGH (ref 13.0–17.0)
Lymphocytes Relative: 18 %
Lymphs Abs: 1.7 10*3/uL (ref 0.7–4.0)
MCH: 32.1 pg (ref 26.0–34.0)
MCHC: 35.2 g/dL (ref 30.0–36.0)
MCV: 91.2 fL (ref 78.0–100.0)
Monocytes Absolute: 0.6 10*3/uL (ref 0.1–1.0)
Monocytes Relative: 6 %
Neutro Abs: 7.4 10*3/uL (ref 1.7–7.7)
Neutrophils Relative %: 75 %
Platelets: 88 10*3/uL — ABNORMAL LOW (ref 150–400)
RBC: 5.45 MIL/uL (ref 4.22–5.81)
RDW: 13.4 % (ref 11.5–15.5)
WBC: 9.8 10*3/uL (ref 4.0–10.5)

## 2015-11-11 LAB — TSH: TSH: 2.104 u[IU]/mL (ref 0.350–4.500)

## 2015-11-11 LAB — BRAIN NATRIURETIC PEPTIDE: B Natriuretic Peptide: 747.1 pg/mL — ABNORMAL HIGH (ref 0.0–100.0)

## 2015-11-11 SURGERY — LEFT HEART CATH AND CORONARY ANGIOGRAPHY

## 2015-11-11 MED ORDER — SODIUM CHLORIDE 0.9% FLUSH
3.0000 mL | Freq: Two times a day (BID) | INTRAVENOUS | Status: DC
  Administered 2015-11-11 – 2015-11-13 (×4): 3 mL via INTRAVENOUS

## 2015-11-11 MED ORDER — MIDAZOLAM HCL 2 MG/2ML IJ SOLN
INTRAMUSCULAR | Status: DC | PRN
  Administered 2015-11-11: 1 mg via INTRAVENOUS

## 2015-11-11 MED ORDER — LIDOCAINE HCL (PF) 1 % IJ SOLN
INTRAMUSCULAR | Status: AC
  Filled 2015-11-11: qty 30

## 2015-11-11 MED ORDER — SODIUM CHLORIDE 0.9 % IV SOLN: 250.0000 mL | INTRAVENOUS | Status: DC | PRN

## 2015-11-11 MED ORDER — CLOPIDOGREL BISULFATE 300 MG PO TABS
300.0000 mg | ORAL_TABLET | Freq: Once | ORAL | Status: AC
  Administered 2015-11-11: 300 mg via ORAL
  Filled 2015-11-11: qty 1

## 2015-11-11 MED ORDER — FENTANYL CITRATE (PF) 100 MCG/2ML IJ SOLN
INTRAMUSCULAR | Status: AC
  Filled 2015-11-11: qty 2

## 2015-11-11 MED ORDER — FUROSEMIDE 10 MG/ML IJ SOLN
40.0000 mg | INTRAMUSCULAR | Status: AC
  Administered 2015-11-11: 40 mg via INTRAVENOUS
  Filled 2015-11-11: qty 4

## 2015-11-11 MED ORDER — HEPARIN BOLUS VIA INFUSION
4000.0000 [IU] | Freq: Once | INTRAVENOUS | Status: AC
  Administered 2015-11-11: 4000 [IU] via INTRAVENOUS

## 2015-11-11 MED ORDER — METOPROLOL TARTRATE 5 MG/5ML IV SOLN
INTRAVENOUS | Status: AC
  Filled 2015-11-11: qty 5

## 2015-11-11 MED ORDER — HEPARIN (PORCINE) IN NACL 100-0.45 UNIT/ML-% IJ SOLN
12.0000 [IU]/kg/h | INTRAMUSCULAR | Status: DC
  Administered 2015-11-11: 12 [IU]/kg/h via INTRAVENOUS
  Filled 2015-11-11: qty 250

## 2015-11-11 MED ORDER — SODIUM CHLORIDE 0.9% FLUSH: 3.0000 mL | INTRAVENOUS | Status: DC | PRN

## 2015-11-11 MED ORDER — ASPIRIN EC 81 MG PO TBEC
81.0000 mg | DELAYED_RELEASE_TABLET | Freq: Every day | ORAL | Status: DC
Start: 2015-11-11 — End: 2015-11-16
  Administered 2015-11-12 – 2015-11-16 (×5): 81 mg via ORAL
  Filled 2015-11-11 (×5): qty 1

## 2015-11-11 MED ORDER — VERAPAMIL HCL 2.5 MG/ML IV SOLN
INTRAVENOUS | Status: AC
  Filled 2015-11-11: qty 2

## 2015-11-11 MED ORDER — CLOPIDOGREL BISULFATE 75 MG PO TABS
75.0000 mg | ORAL_TABLET | Freq: Every day | ORAL | Status: DC
  Administered 2015-11-12 – 2015-11-16 (×5): 75 mg via ORAL
  Filled 2015-11-11 (×6): qty 1

## 2015-11-11 MED ORDER — SODIUM CHLORIDE 0.9% FLUSH
3.0000 mL | Freq: Two times a day (BID) | INTRAVENOUS | Status: DC
  Administered 2015-11-11 – 2015-11-14 (×4): 3 mL via INTRAVENOUS

## 2015-11-11 MED ORDER — ATORVASTATIN CALCIUM 40 MG PO TABS
40.0000 mg | ORAL_TABLET | Freq: Every day | ORAL | Status: DC
  Administered 2015-11-13 – 2015-11-15 (×3): 40 mg via ORAL
  Filled 2015-11-11 (×4): qty 1

## 2015-11-11 MED ORDER — HEPARIN SODIUM (PORCINE) 5000 UNIT/ML IJ SOLN
5000.0000 [IU] | Freq: Three times a day (TID) | INTRAMUSCULAR | Status: DC
  Administered 2015-11-11 – 2015-11-16 (×13): 5000 [IU] via SUBCUTANEOUS
  Filled 2015-11-11 (×12): qty 1

## 2015-11-11 MED ORDER — NITROGLYCERIN 1 MG/10 ML FOR IR/CATH LAB
INTRA_ARTERIAL | Status: AC
  Filled 2015-11-11: qty 10

## 2015-11-11 MED ORDER — IOPAMIDOL (ISOVUE-370) INJECTION 76%
INTRAVENOUS | Status: DC | PRN
  Administered 2015-11-11: 105 mL via INTRAVENOUS

## 2015-11-11 MED ORDER — IOPAMIDOL (ISOVUE-370) INJECTION 76%
INTRAVENOUS | Status: AC
  Filled 2015-11-11: qty 125

## 2015-11-11 MED ORDER — FENTANYL CITRATE (PF) 100 MCG/2ML IJ SOLN
INTRAMUSCULAR | Status: DC | PRN
  Administered 2015-11-11: 25 ug via INTRAVENOUS

## 2015-11-11 MED ORDER — PANTOPRAZOLE SODIUM 40 MG PO TBEC
40.0000 mg | DELAYED_RELEASE_TABLET | Freq: Every day | ORAL | Status: DC
Start: 2015-11-11 — End: 2015-11-16
  Administered 2015-11-11 – 2015-11-16 (×6): 40 mg via ORAL
  Filled 2015-11-11 (×6): qty 1

## 2015-11-11 MED ORDER — MIDAZOLAM HCL 2 MG/2ML IJ SOLN
INTRAMUSCULAR | Status: AC
  Filled 2015-11-11: qty 2

## 2015-11-11 MED ORDER — ACETAMINOPHEN 325 MG PO TABS
650.0000 mg | ORAL_TABLET | ORAL | Status: DC | PRN
  Administered 2015-11-15: 650 mg via ORAL
  Filled 2015-11-11 (×2): qty 2

## 2015-11-11 MED ORDER — HEPARIN (PORCINE) IN NACL 2-0.9 UNIT/ML-% IJ SOLN
INTRAMUSCULAR | Status: DC | PRN
  Administered 2015-11-11: 1500 mL

## 2015-11-11 MED ORDER — LIDOCAINE HCL (PF) 1 % IJ SOLN
INTRAMUSCULAR | Status: DC | PRN
  Administered 2015-11-11: 2 mL

## 2015-11-11 MED ORDER — TAMSULOSIN HCL 0.4 MG PO CAPS
0.4000 mg | ORAL_CAPSULE | Freq: Every day | ORAL | Status: DC
  Administered 2015-11-12 – 2015-11-15 (×4): 0.4 mg via ORAL
  Filled 2015-11-11 (×4): qty 1

## 2015-11-11 MED ORDER — VERAPAMIL HCL 2.5 MG/ML IV SOLN
INTRAVENOUS | Status: DC | PRN
  Administered 2015-11-11: 1.2 mL via INTRA_ARTERIAL

## 2015-11-11 MED ORDER — HEPARIN (PORCINE) IN NACL 2-0.9 UNIT/ML-% IJ SOLN
INTRAMUSCULAR | Status: AC
  Filled 2015-11-11: qty 1500

## 2015-11-11 MED ORDER — NITROGLYCERIN IN D5W 200-5 MCG/ML-% IV SOLN
5.0000 ug/min | Freq: Once | INTRAVENOUS | Status: AC
  Administered 2015-11-11: 5 ug/min via INTRAVENOUS
  Filled 2015-11-11: qty 250

## 2015-11-11 MED ORDER — ONDANSETRON HCL 4 MG/2ML IJ SOLN: 4.0000 mg | Freq: Four times a day (QID) | INTRAMUSCULAR | Status: DC | PRN

## 2015-11-11 MED ORDER — NITROGLYCERIN 0.4 MG SL SUBL: 0.4000 mg | SUBLINGUAL_TABLET | SUBLINGUAL | Status: DC | PRN

## 2015-11-11 MED ORDER — METOPROLOL SUCCINATE ER 25 MG PO TB24
25.0000 mg | ORAL_TABLET | Freq: Two times a day (BID) | ORAL | Status: DC
  Administered 2015-11-11 – 2015-11-16 (×10): 25 mg via ORAL
  Filled 2015-11-11 (×10): qty 1

## 2015-11-11 SURGICAL SUPPLY — 11 items
CATH INFINITI 5FR MULTPACK ANG (CATHETERS) ×2 IMPLANT
DEVICE RAD COMP TR BAND LRG (VASCULAR PRODUCTS) ×2 IMPLANT
GLIDESHEATH SLEND SS 6F .021 (SHEATH) ×2 IMPLANT
KIT ENCORE 26 ADVANTAGE (KITS) ×2 IMPLANT
KIT HEART LEFT (KITS) ×3 IMPLANT
PACK CARDIAC CATHETERIZATION (CUSTOM PROCEDURE TRAY) ×3 IMPLANT
SYR MEDRAD MARK V 150ML (SYRINGE) ×3 IMPLANT
TRANSDUCER W/STOPCOCK (MISCELLANEOUS) ×3 IMPLANT
TUBING CIL FLEX 10 FLL-RA (TUBING) ×3 IMPLANT
WIRE HI TORQ VERSACORE-J 145CM (WIRE) ×2 IMPLANT
WIRE SAFE-T 1.5MM-J .035X260CM (WIRE) ×2 IMPLANT

## 2015-11-11 NOTE — ED Provider Notes (Signed)
CSN: [id number]     Arrival date & time 11/11/15  1335 History   First MD Initiated Contact with Patient 11/11/15 1341     No chief complaint on file.    (Consider location/radiation/quality/duration/timing/severity/associated sxs/prior Treatment) HPI  The patient is an 82 year old male, he has a history of cardiac disease, multiple catheterizations, he had a cardiac bypass procedure in the past and is currently followed by Dr. Domenic Polite with cardiology as well as Dr. Luan Pulling who is his family doctor and who referred him to the hospital today because of increased shortness of breath and an abnormal EKG. The patient states that yesterday in the afternoon he started to get short of breath, this was persistent, severe, worse with ambulation and with laying supine. The patient states he also has significant swelling in his bilateral lower extremities. He denies any chest pain at this time  Past Medical History  Diagnosis Date  . Malignant melanoma of skin   . Coronary atherosclerosis of native coronary artery     Multivessel status post CABG in 1992  . OSA (obstructive sleep apnea)   . Rosacea   . Mixed hyperlipidemia   . Nephrolithiasis   . Essential hypertension, benign    Past Surgical History  Procedure Laterality Date  . Total knee arthroplasty    . Hernia repair    . Coronary artery bypass graft  1992  . Melanoma excision      neck  . Cataract extraction w/phaco Right 02/11/2014    Procedure: CATARACT EXTRACTION PHACO AND INTRAOCULAR LENS PLACEMENT (IOC);  Surgeon: [name], MD;  Location: [facility];  Service: Ophthalmology;  Laterality: Right;  CDE 13.83  . Doppler echocardiography  05/23/2007    Left ventricular systolic function is normal. The transmitral spectral doppler flow pattern is suggestive of impaired LV relaxation. No significant valvular abnormalities.  . Nm myocar perf ejection fraction  02/10/2011    The post stress myocaardial perfusion images show a normal pattern of  perfusion in all regions. No significant wall motion abnormalities noted. EKG is negative for ischemia. This is a low risk scan.  . Cardiac catheterization  07/11/90  . Carotid duplex  06/24/2011, 05/12/2009    Right and Left ICAs: Demonstrates a small amount of irregular mixed density plaque with no evidence of diameter reduction, significant tortuosity or any other vascular abnormality. This is a mildly abnormal carotid duplex doppler evaluation.  [name] Heart monitor  04/01/2009    palpitations  . Cholecystectomy     Family History  Problem Relation Age of Onset  . Skin cancer Brother   . Heart attack Father   . Heart attack Brother    Social History  Substance Use Topics  . Smoking status: Former Smoker    Types: Cigarettes    Start date: 07/06/1943    Quit date: 07/05/1981  . Smokeless tobacco: Never Used  . Alcohol Use: No    Review of Systems  All other systems reviewed and are negative.     Allergies  Statins and Tape  Home Medications   Prior to Admission medications   Medication Sig Start Date End Date Taking? Authorizing Provider  amLODipine (NORVASC) 5 MG tablet Take 5 mg by mouth daily.    Historical Provider, MD  aspirin EC 81 MG tablet Take 81 mg by mouth daily.    Historical Provider, MD  metoprolol succinate (TOPROL-XL) 50 MG 24 hr tablet Take 50 mg by mouth 2 (two) times daily.  05/20/15   Historical Provider,  MD  pantoprazole (PROTONIX) 40 MG tablet Take 40 mg by mouth daily. 10/29/13   Historical Provider, MD  tamsulosin (FLOMAX) 0.4 MG CAPS capsule Take 0.4 mg by mouth daily.  08/11/15   Historical Provider, MD   BP 155/84 mmHg  Pulse 71  Temp(Src) 97.5 F (36.4 C) (Oral)  Resp 16  Ht 5\' 6"  (1.676 m)  Wt 204 lb (92.534 kg)  BMI 32.94 kg/m2  SpO2 91% Physical Exam  Constitutional: He appears well-developed and well-nourished. No distress.  HENT:  Head: Normocephalic and atraumatic.  Mouth/Throat: Oropharynx is clear and moist. No oropharyngeal exudate.   Eyes: Conjunctivae and EOM are normal. Pupils are equal, round, and reactive to light. Right eye exhibits no discharge. Left eye exhibits no discharge. No scleral icterus.  Neck: Normal range of motion. Neck supple. No JVD present. No thyromegaly present.  Cardiovascular: Normal rate, regular rhythm, normal heart sounds and intact distal pulses.  Exam reveals no gallop and no friction rub.   No murmur heard. Pulmonary/Chest: Effort normal. No respiratory distress. He has no wheezes. He has rales.  Abdominal: Soft. Bowel sounds are normal. He exhibits no distension and no mass. There is no tenderness.  Musculoskeletal: Normal range of motion. He exhibits edema. He exhibits no tenderness.  Lymphadenopathy:    He has no cervical adenopathy.  Neurological: He is alert. Coordination normal.  Skin: Skin is warm and dry. No rash noted. No erythema.  Psychiatric: He has a normal mood and affect. His behavior is normal.  Nursing note and vitals reviewed.   ED Course  Procedures (including critical care time) Labs Review Labs Reviewed  CBC WITH DIFFERENTIAL/PLATELET  BASIC METABOLIC PANEL  TROPONIN I    Imaging Review Dg Chest Portable 1 View  11/11/2015  CLINICAL DATA:  Shortness of breath for 24 hours EXAM: PORTABLE CHEST 1 VIEW COMPARISON:  12/14/2010 FINDINGS: Limited low volume chest. There is cardiomegaly and vascular pedicle widening. Chronic aortic tortuosity. Interstitial coarsening is likely from low volumes and technique. There is no edema, consolidation, effusion, or pneumothorax. IMPRESSION: Limited low volume chest without visible pneumonia or edema. Electronically Signed   By: Monte Fantasia M.D.   On: 11/11/2015 14:09   I have personally reviewed and evaluated these images and lab results as part of my medical decision-making.   EKG Interpretation   Date/Time:  Tuesday Nov 11 2015 13:46:31 EDT Ventricular Rate:  69 PR Interval:  232 QRS Duration: 137 QT Interval:   397 QTC Calculation: 425 R Axis:   94 Text Interpretation:  Sinus rhythm Prolonged PR interval Nonspecific  intraventricular conduction delay Abnormal inferior Q waves Posterior  infarct, acute (LCx) Lateral leads are also involved ST depression V1-V3,  suggest recording posterior leads Since last tracing ST abnormalities now  present Confirmed by Tersa Fotopoulos  MD, [place] ([id number]) on 11/11/2015 1:53:37 PM      MDM   Final diagnoses:  ST elevation myocardial infarction (STEMI), unspecified artery ([place])    The patient was hypoxic requiring oxygen, he has an abnormal EKG which shows ST depressions in the lateral leads and the anterior leads as well as slight ST abnormalities in the inferior leads. I'm significant concern that he may be having a posterior or inferior infarct. I have paged cardiology immediately, I will start heparin  D.w Dr. Harl Bowie - will come to see pt immedaitely (2:00 PM)  Meds given in ED:  Medications  heparin ADULT infusion 100 units/mL (25000 units/250 mL) (12 Units/kg/hr  92.5 kg Intravenous  New Bag/Given 11/11/15 1411)  heparin bolus via infusion 4,000 Units (4,000 Units Intravenous Given 11/11/15 1412)  furosemide (LASIX) injection 40 mg (40 mg Intravenous Given 11/11/15 1408)  nitroGLYCERIN 50 mg in dextrose 5 % 250 mL (0.2 mg/mL) infusion (5 mcg/min Intravenous New Bag/Given 11/11/15 1411)    New Prescriptions   No medications on file     CRITICAL CARE Performed by: Johnna Acosta Total critical care time: 35 minutes Critical care time was exclusive of separately billable procedures and treating other patients. Critical care was necessary to treat or prevent imminent or life-threatening deterioration. Critical care was time spent personally by me on the following activities: development of treatment plan with patient and/or surrogate as well as nursing, discussions with consultants, evaluation of patient's response to treatment, examination of patient, obtaining  history from patient or surrogate, ordering and performing treatments and interventions, ordering and review of laboratory studies, ordering and review of radiographic studies, pulse oximetry and re-evaluation of patient's condition.  Dr. Harl Bowie - has activated STEMI after seeing posterior ECG - 2:22 - activated D/w nursing in cath lab with Dr. Tamala Julian  They are in agreement to activate    Noemi Chapel, MD 11/11/15 1428

## 2015-11-11 NOTE — H&P (Signed)
Expand All Collapse All      Primary cardiologist: Dr [name] Consulting cardiologist: Dr Carlyle Dolly Requesting phsyician: Dr Noemi Chapel Indication SOB  Clinical Summary John [name] is a 82 y.o.male history of CAD with prior CABG in 1992, HL, HTN presents with SOB. Intermittent symptoms on and off for the last 2-3 days, with significant increase in severity this morning causing him to come to the ER. He denies any chest pain. He has had some increased LE edema.   Labs pending EKG: SR, ST elevations inferior leads with anterior ST depressions. F/u posterior EKG shows ST elevation posterior leads (EKG in epic timed 1417 V4,V5,V6 are actually posterior leads V7,V8,V9).    Allergies  Allergen Reactions  . Statins Other (See Comments)    Weak,nervous  . Tape Rash    Medications Scheduled Medications:     Infusions:  . heparin 12 Units/kg/hr (11/11/15 1411)     PRN Medications:     Past Medical History  Diagnosis Date  . Malignant melanoma of skin   . Coronary atherosclerosis of native coronary artery     Multivessel status post CABG in 1992  . OSA (obstructive sleep apnea)   . Rosacea   . Mixed hyperlipidemia   . Nephrolithiasis   . Essential hypertension, benign     Past Surgical History  Procedure Laterality Date  . Total knee arthroplasty    . Hernia repair    . Coronary artery bypass graft  1992  . Melanoma excision      neck  . Cataract extraction w/phaco Right 02/11/2014    Procedure: CATARACT EXTRACTION PHACO AND INTRAOCULAR LENS PLACEMENT (IOC); Surgeon: [name], MD; Location: [facility]; Service: Ophthalmology; Laterality: Right; CDE 13.83  . Doppler echocardiography  05/23/2007    Left ventricular systolic function is normal. The transmitral spectral doppler flow pattern is suggestive of impaired LV relaxation. No significant valvular abnormalities.    . Nm myocar perf ejection fraction  02/10/2011    The post stress myocaardial perfusion images show a normal pattern of perfusion in all regions. No significant wall motion abnormalities noted. EKG is negative for ischemia. This is a low risk scan.  . Cardiac catheterization  07/11/90  . Carotid duplex  06/24/2011, 05/12/2009    Right and Left ICAs: Demonstrates a small amount of irregular mixed density plaque with no evidence of diameter reduction, significant tortuosity or any other vascular abnormality. This is a mildly abnormal carotid duplex doppler evaluation.  [name] Heart monitor  04/01/2009    palpitations  . Cholecystectomy      Family History  Problem Relation Age of Onset  . Skin cancer Brother   . Heart attack Father   . Heart attack Brother     Social History John [name] reports that he quit smoking about 34 years ago. His smoking use included Cigarettes. He started smoking about 72 years ago. He has never used smokeless tobacco. John [name] reports that he does not drink alcohol.  Review of Systems CONSTITUTIONAL: No weight loss, fever, chills, weakness or fatigue.  HEENT: Eyes: No visual loss, blurred vision, double vision or yellow sclerae. No hearing loss, sneezing, congestion, runny nose or sore throat.  SKIN: No rash or itching.  CARDIOVASCULAR: per HPI RESPIRATORY: CTAB GASTROINTESTINAL: No anorexia, nausea, vomiting or diarrhea. No abdominal pain or blood.  GENITOURINARY: no polyuria, no dysuria NEUROLOGICAL: No headache, dizziness, syncope, paralysis, ataxia, numbness or tingling in the extremities. No change in bowel or bladder control.  MUSCULOSKELETAL: No  muscle, back pain, joint pain or stiffness.  HEMATOLOGIC: No anemia, bleeding or bruising.  LYMPHATICS: No enlarged nodes. No history of splenectomy.  PSYCHIATRIC: No history of depression or anxiety.      Physical Examination Blood pressure 155/84,  pulse 71, temperature 97.5 F (36.4 C), temperature source Oral, resp. rate 16, height 5\' 6"  (1.676 m), weight 204 lb (92.534 kg), SpO2 91 %. No intake or output data in the 24 hours ending 11/11/15 1429  HEENT: sclera clear, throat clear  Cardiovascular: RRR, no m/r/g, no jvd  Respiratory: CTAB  GI: abdomen soft, NT, ND  MSK: 1+ bilateral LE edema  Neuro: no focal deficits  Psych: appropriate affect   Lab Results  Basic Metabolic Panel:  Last Labs     No results for input(s): NA, K, CL, CO2, GLUCOSE, BUN, CREATININE, CALCIUM, MG, PHOS in the last 168 hours.    Liver Function Tests:  Last Labs     No results for input(s): AST, ALT, ALKPHOS, BILITOT, PROT, ALBUMIN in the last 168 hours.    CBC:  Last Labs     No results for input(s): WBC, NEUTROABS, HGB, HCT, MCV, PLT in the last 168 hours.    Cardiac Enzymes:  Last Labs     No results for input(s): CKTOTAL, CKMB, CKMBINDEX, TROPONINI in the last 168 hours.    BNP:  Last Labs     Invalid input(s): POCBNP       Impression/Recommendations 1. STEMI - patient presents with intermittent episodes of SOB over the last 2-3 days, severe episode this AM. He has not had any chest pain, has developed some worsening edema. He is hemodynamically and eletrically stable.  - EKG concerning for inferoposterior MI (note EPIC EKG timed 1417 is posterior EKG), he will be transferred to [facility] for emergent cath. He has taken ASA prior to arrival, has been started on heparin gtt and NG gtt by ER staff. Now with more clear EKG findings will stop NG drip in setting of inferoposterior MI and continue heparin.    Carlyle Dolly, M.D     See note above by Dr Harl Bowie. Pt here for emergency cath/PCI in setting of STEMI. I have seen him on arrival to cath lab and he is appropriate to move forward with the procedure.   [name] 11/11/2015 5:33 PM

## 2015-11-11 NOTE — Consult Note (Addendum)
Primary cardiologist: Dr [name] Consulting cardiologist: Dr Carlyle Dolly Requesting phsyician: Dr Noemi Chapel Indication SOB  Clinical Summary John [name] is a 82 y.o.male history of CAD with prior CABG in 1992, HL, HTN presents with SOB. Intermittent symptoms on and off for the last 2-3 days, with significant increase in severity this morning causing him to come to the ER. He denies any chest pain. He has had some increased LE edema.   Labs pending EKG: SR, ST elevations inferior leads with anterior ST depressions. F/u posterior EKG shows ST elevation posterior leads (EKG in epic timed 1417 V4,V5,V6 are actually posterior leads V7,V8,V9).    Allergies  Allergen Reactions  . Statins Other (See Comments)    Weak,nervous  . Tape Rash    Medications Scheduled Medications:     Infusions: . heparin 12 Units/kg/hr (11/11/15 1411)     PRN Medications:     Past Medical History  Diagnosis Date  . Malignant melanoma of skin   . Coronary atherosclerosis of native coronary artery     Multivessel status post CABG in 1992  . OSA (obstructive sleep apnea)   . Rosacea   . Mixed hyperlipidemia   . Nephrolithiasis   . Essential hypertension, benign     Past Surgical History  Procedure Laterality Date  . Total knee arthroplasty    . Hernia repair    . Coronary artery bypass graft  1992  . Melanoma excision      neck  . Cataract extraction w/phaco Right 02/11/2014    Procedure: CATARACT EXTRACTION PHACO AND INTRAOCULAR LENS PLACEMENT (IOC);  Surgeon: Tonny Liel Rudden, MD;  Location: [facility];  Service: Ophthalmology;  Laterality: Right;  CDE 13.83  . Doppler echocardiography  05/23/2007    Left ventricular systolic function is normal. The transmitral spectral doppler flow pattern is suggestive of impaired LV relaxation. No significant valvular abnormalities.  . Nm myocar perf ejection fraction  02/10/2011    The post stress myocaardial perfusion images show a normal  pattern of perfusion in all regions. No significant wall motion abnormalities noted. EKG is negative for ischemia. This is a low risk scan.  . Cardiac catheterization  07/11/90  . Carotid duplex  06/24/2011, 05/12/2009    Right and Left ICAs: Demonstrates a small amount of irregular mixed density plaque with no evidence of diameter reduction, significant tortuosity or any other vascular abnormality. This is a mildly abnormal carotid duplex doppler evaluation.  [name] Heart monitor  04/01/2009    palpitations  . Cholecystectomy      Family History  Problem Relation Age of Onset  . Skin cancer Brother   . Heart attack Father   . Heart attack Brother     Social History Mr. Hutcherson reports that he quit smoking about 34 years ago. His smoking use included Cigarettes. He started smoking about 72 years ago. He has never used smokeless tobacco. Mr. Barthol reports that he does not drink alcohol.  Review of Systems CONSTITUTIONAL: No weight loss, fever, chills, weakness or fatigue.  HEENT: Eyes: No visual loss, blurred vision, double vision or yellow sclerae. No hearing loss, sneezing, congestion, runny nose or sore throat.  SKIN: No rash or itching.  CARDIOVASCULAR: per HPI RESPIRATORY: CTAB GASTROINTESTINAL: No anorexia, nausea, vomiting or diarrhea. No abdominal pain or blood.  GENITOURINARY: no polyuria, no dysuria NEUROLOGICAL: No headache, dizziness, syncope, paralysis, ataxia, numbness or tingling in the extremities. No change in bowel or bladder control.  MUSCULOSKELETAL: No muscle, back pain, joint  pain or stiffness.  HEMATOLOGIC: No anemia, bleeding or bruising.  LYMPHATICS: No enlarged nodes. No history of splenectomy.  PSYCHIATRIC: No history of depression or anxiety.      Physical Examination Blood pressure 155/84, pulse 71, temperature 97.5 F (36.4 C), temperature source Oral, resp. rate 16, height 5\' 6"  (1.676 m), weight 204 lb (92.534 kg), SpO2 91 %. No intake or output data  in the 24 hours ending 11/11/15 1429  HEENT: sclera clear, throat clear  Cardiovascular: RRR, no m/r/g, no jvd  Respiratory: CTAB  GI: abdomen soft, NT, ND  MSK: 1+ bilateral LE edema  Neuro: no focal deficits  Psych: appropriate affect   Lab Results  Basic Metabolic Panel: No results for input(s): NA, K, CL, CO2, GLUCOSE, BUN, CREATININE, CALCIUM, MG, PHOS in the last 168 hours.  Liver Function Tests: No results for input(s): AST, ALT, ALKPHOS, BILITOT, PROT, ALBUMIN in the last 168 hours.  CBC: No results for input(s): WBC, NEUTROABS, HGB, HCT, MCV, PLT in the last 168 hours.  Cardiac Enzymes: No results for input(s): CKTOTAL, CKMB, CKMBINDEX, TROPONINI in the last 168 hours.  BNP: Invalid input(s): POCBNP     Impression/Recommendations 1. STEMI - patient presents with intermittent episodes of SOB over the last 2-3 days, severe episode this AM. He has not had any chest pain, has developed some worsening edema. He is hemodynamically and eletrically stable.  - EKG concerning for inferoposterior MI (note EPIC EKG timed 1417 is posterior EKG), he will be transferred to [facility] for emergent cath. He has taken ASA prior to arrival, has been started on heparin gtt and NG gtt by ER staff. Now with more clear EKG findings will stop NG drip in setting of inferoposterior MI and continue heparin.     Carlyle Dolly, M.D

## 2015-11-11 NOTE — ED Notes (Signed)
Pt c/o SOB x 2 days, sent from Dr. Luan Pulling' office to ER for evaluation.  Reports pcp's office ekg showed some changes so ems was called.  EMS reports pt pain free.  EMS started 18g IV in left ac.

## 2015-11-11 NOTE — ED Notes (Signed)
Called [facility] and [facility] to activate STEMI.

## 2015-11-11 NOTE — ED Notes (Signed)
CRITICAL VALUE ALERT  Critical value received:  Troponin 20.14 Date of notification:  11/11/2015  Time of notification: [id number]  Critical value read back:Yes.    Nurse who received alert:  [facility] RN  MD notified (1st page):  Dr. Sabra Heck   Time of first page:  1538  MD notified (2nd page):  Time of second page:  Responding MD:  Dr. Sabra Heck  Time MD responded:  Dr. Sabra Heck

## 2015-11-12 ENCOUNTER — Encounter (HOSPITAL_COMMUNITY): Payer: Self-pay | Admitting: Cardiovascular Disease

## 2015-11-12 LAB — LIPID PANEL
Cholesterol: 170 mg/dL (ref 0–200)
HDL: 28 mg/dL — ABNORMAL LOW (ref >40)
LDL Cholesterol: 101 mg/dL — ABNORMAL HIGH (ref 0–99)
Total CHOL/HDL Ratio: 6.1 RATIO
Triglycerides: 207 mg/dL — ABNORMAL HIGH (ref <150)
VLDL: 41 mg/dL — ABNORMAL HIGH (ref 0–40)

## 2015-11-12 LAB — CBC
HCT: 45.9 % (ref 39.0–52.0)
Hemoglobin: 15.8 g/dL (ref 13.0–17.0)
MCH: 31.6 pg (ref 26.0–34.0)
MCHC: 34.4 g/dL (ref 30.0–36.0)
MCV: 91.8 fL (ref 78.0–100.0)
Platelets: 89 10*3/uL — ABNORMAL LOW (ref 150–400)
RBC: 5 MIL/uL (ref 4.22–5.81)
RDW: 13.5 % (ref 11.5–15.5)
WBC: 6.9 10*3/uL (ref 4.0–10.5)

## 2015-11-12 LAB — BASIC METABOLIC PANEL
Anion gap: 14 (ref 5–15)
BUN: 14 mg/dL (ref 6–20)
CO2: 25 mmol/L (ref 22–32)
Calcium: 9 mg/dL (ref 8.9–10.3)
Chloride: 103 mmol/L (ref 101–111)
Creatinine, Ser: 1.15 mg/dL (ref 0.61–1.24)
GFR calc Af Amer: >60 mL/min (ref >60)
GFR calc non Af Amer: 57 mL/min — ABNORMAL LOW (ref >60)
Glucose, Bld: 102 mg/dL — ABNORMAL HIGH (ref 65–99)
Potassium: 3.5 mmol/L (ref 3.5–5.1)
Sodium: 142 mmol/L (ref 135–145)

## 2015-11-12 LAB — HEMOGLOBIN A1C
Hgb A1c MFr Bld: 5.9 % — ABNORMAL HIGH (ref 4.8–5.6)
Mean Plasma Glucose: 123 mg/dL

## 2015-11-12 LAB — TROPONIN I: Troponin I: 29.91 ng/mL (ref <0.031)

## 2015-11-12 MED FILL — Nitroglycerin IV Soln 100 MCG/ML in D5W: INTRA_ARTERIAL | Qty: 10 | Status: AC

## 2015-11-12 NOTE — Progress Notes (Signed)
Met with patient and his family this evening. Discussed further revascularization. Considering severe residual LAD stenosis just beyond LIMA insertion in the setting of CHF, severe LV dysfunction, recent posterior infarct, and severe multivessel CAD, I've recommended 'protected PCI' with Impella hemodynamic support. Have reviewed risks, indications, and alternatives to this approach. They understand increased risk of PCI but with previous cardiac surgery and advanced age, this clearly seems to be his best option for revascularization. He has been started on clopidogrel and is tolerating. No recurrence of angina or shortness of breath at rest. Plan to do PCI Friday. All questions answered.  [name] 11/12/2015 11:16 PM

## 2015-11-12 NOTE — Progress Notes (Signed)
Patient Profile: 82 y.o.male history of CAD with prior CABG in 1992, HL, HTN presents with SOB. EKG with ST elevations inferior leads with anterior ST depressions. F/u posterior EKG shows ST elevation posterior leads (EKG in epic timed 1417 V4,V5,V6 are actually posterior leads V7,V8,V9). Patient admitted for STEMI.  Subjective: Doing well. No chest Pain. Breathing ok with [place]. Radial cath site is stable.   Objective: Vital signs in last 24 hours: Temp:  [97.5 F (36.4 C)-98.5 F (36.9 C)] 98 F (36.7 C) (05/10 0700) Pulse Rate:  [58-77] 67 (05/10 0700) Resp:  [13-29] 18 (05/10 0700) BP: (109-155)/(71-97) 124/78 mmHg (05/10 0700) SpO2:  [88 %-97 %] 92 % (05/10 0700) Weight:  [204 lb (92.534 kg)-204 lb 2.3 oz (92.6 kg)] 204 lb 2.3 oz (92.6 kg) (05/10 0424)    Intake/Output from previous day: 05/09 0701 - 05/10 0700 In: -  Out: 2500 [Urine:2500] Intake/Output this shift:    Medications Current Facility-Administered Medications  Medication Dose Route Frequency Provider Last Rate Last Dose  . 0.9 %  sodium chloride infusion  250 mL Intravenous PRN [name], MD      . 0.9 %  sodium chloride infusion  250 mL Intravenous PRN [name], MD      . acetaminophen (TYLENOL) tablet 650 mg  650 mg Oral Q4H PRN [name], MD      . aspirin EC tablet 81 mg  81 mg Oral Daily [name], MD   81 mg at 11/11/15 2300  . atorvastatin (LIPITOR) tablet 40 mg  40 mg Oral q1800 [name], MD      . clopidogrel (PLAVIX) tablet 75 mg  75 mg Oral Q breakfast [name], MD   75 mg at 11/12/15 0805  . heparin injection 5,000 Units  5,000 Units Subcutaneous Q8H [name], MD   5,000 Units at 11/12/15 0600  . metoprolol (LOPRESSOR) 5 MG/5ML injection           . metoprolol succinate (TOPROL-XL) 24 hr tablet 25 mg  25 mg Oral BID [name], MD   25 mg at 11/11/15 2157  . nitroGLYCERIN (NITROSTAT) SL tablet 0.4 mg  0.4 mg Sublingual Q5 Min x 3 PRN [name], MD       . ondansetron [facility]) injection 4 mg  4 mg Intravenous Q6H PRN [name], MD      . pantoprazole (PROTONIX) EC tablet 40 mg  40 mg Oral Daily [name], MD   40 mg at 11/11/15 2159  . sodium chloride flush (NS) 0.9 % injection 3 mL  3 mL Intravenous Q12H [name], MD   3 mL at 11/11/15 2030  . sodium chloride flush (NS) 0.9 % injection 3 mL  3 mL Intravenous PRN [name], MD      . sodium chloride flush (NS) 0.9 % injection 3 mL  3 mL Intravenous Q12H [name], MD   3 mL at 11/11/15 2147  . sodium chloride flush (NS) 0.9 % injection 3 mL  3 mL Intravenous PRN [name], MD      . tamsulosin [facility]) capsule 0.4 mg  0.4 mg Oral QPC supper [name], MD         PE: General appearance: alert, cooperative and no distress Neck: no carotid bruit and no JVD Lungs: clear to auscultation bilaterally Heart: regular rate and rhythm, S1, S2 normal, no murmur, click, rub or gallop Extremities: no LEE Pulses: 2+ and symmetric Skin: warm and dry Neurologic: Grossly  normal  Lab Results:   Recent Labs  11/11/15 1401 11/11/15 1803 11/12/15 0432  WBC 9.8 8.5 6.9  HGB 17.5* 17.1* 15.8  HCT 49.7 50.0 45.9  PLT 88* 98* 89*   BMET  Recent Labs  11/11/15 1401 11/11/15 1803 11/12/15 0432  NA 139  --  142  K 3.9  --  3.5  CL 105  --  103  CO2 22  --  25  GLUCOSE 121*  --  102*  BUN 19  --  14  CREATININE 1.21 1.29* 1.15  CALCIUM 9.2  --  9.0   PT/INR No results for input(s): LABPROT, INR in the last 72 hours. Cholesterol  Lipid Panel     Component Value Date/Time   CHOL 170 11/12/2015 0432   TRIG 207* 11/12/2015 0432   HDL 28* 11/12/2015 0432   CHOLHDL 6.1 11/12/2015 0432   VLDL 41* 11/12/2015 0432   LDLCALC 101* 11/12/2015 0432     Cardiac Panel (last 3 results)  Recent Labs  11/11/15 1803 11/11/15 2235 11/12/15 0432  TROPONINI 25.59* 27.18* 29.91*    Studies/Results: Procedures    Left Heart Cath and Coronary Angiography      Conclusion     Sequential SVG .  The first limb of the sequential SVG-diagonal/ramus intermedius is patent, but the second limb is occluded.  Origin lesion, before 1st Diag, 40% stenosed.  Prox RCA to Mid RCA lesion, 100% stenosed.  LIMA .  The LIMA is patent, but there is a tight lesion just beyond the insertion site in the mid-LAD  Mid LAD to Dist LAD lesion, 90% stenosed.  Ost LM lesion, 100% stenosed.  Ost Cx lesion, 100% stenosed.  There is severe left ventricular systolic dysfunction.  1. Severe native vessel CAD with total occlusion of the left main and RCA 2. S/P CABG with continued patency of the LIMA-LAD and first limb of the sequential SVG-diagonal 3. Total occlusion of the second limb of the SVG-ramus graft (culprit vessel) 4. Severe stenosis of the mid-LAD at the LIMA anastomotic site 5. Severe segmental LV systolic dysfunction  Tx CCU, post-MI medical therapy, 2D echo, start plavix 300 mg then 75 mg daily, consider PCI of the LIMA-LAD insertion. PCI will be high-risk in setting of recent infarct and severe LV dysfunction will need to consider hemodynamic support.   Left Heart    Left Ventricle There is severe left ventricular systolic dysfunction. The left ventricular ejection fraction is less than 25% by visual estimate. There is severe segmental LV dysfunction, with LVEF estimated 25%     Assessment/Plan  Active Problems:   Acute ST elevation myocardial infarction (STEMI) of posterior wall (HCC)   1. CAD/ STEMI: angiographic details outlined above. The culprit lesion for his STEMI is felt to be total occulusion of the second limb of the SVG-ramus graft. There is also severe stenosis of the mid-LAD at the LIMA anastomotic site. Patient has been loaded with Palvix. Plan is to consider PCI of the LIMA-LAD insertion. PCI will be high-risk in setting of recent infarct and severe LV dysfunction will need to consider hemodynamic support. He denies any CP.   Will check a f/u troponin to ensure level is trending downward. Continue ASA, Plavix, Lipitor and metoprolol.   2. HLD: LDL is 101 mg/dL. On Lipitor 40 mg. Will need to consider further increasing to 80 mg nightly.   3. Severe LV Dysfunction: EF is 25% by cath. Continue long acting metoprolol. Consider adding low dose ACE-I.  Monitor volume status. Monitor telemetry for arrhythmias. Consider LifeVest prior to discharge.     LOS: 1 day    Brittainy M. Rosita Fire, PA-C 11/12/2015 9:22 AM  Attending Note:   The patient was seen and examined.  Agree with assessment and plan as noted above.  Changes made to the above note as needed.  Pt is doing very well. No further pain  Has been loaded with plavix  Hemodynamically stable  Plan is for possible PCI of the sequential SVG to the Ramus - wife said that Dr. Burt Knack had suggested Friday .  He can go to stepdown or tele .     Thayer Headings, Brooke Bonito., MD, [facility] 11/12/2015, 11:15 AM [address]  [place] Pager [phone]

## 2015-11-13 ENCOUNTER — Inpatient Hospital Stay (HOSPITAL_COMMUNITY): Payer: Medicare Other

## 2015-11-13 DIAGNOSIS — I509 Heart failure, unspecified: Secondary | ICD-10-CM

## 2015-11-13 DIAGNOSIS — I5023 Acute on chronic systolic (congestive) heart failure: Secondary | ICD-10-CM

## 2015-11-13 LAB — ECHOCARDIOGRAM COMPLETE
Height: 66 in
Weight: 3284.8 oz

## 2015-11-13 LAB — PROTIME-INR
INR: 1.36 (ref 0.00–1.49)
Prothrombin Time: 16.9 seconds — ABNORMAL HIGH (ref 11.6–15.2)

## 2015-11-13 MED ORDER — SODIUM CHLORIDE 0.9 % IV SOLN
INTRAVENOUS | Status: DC
  Administered 2015-11-14: 10:00:00 via INTRAVENOUS

## 2015-11-13 MED ORDER — SODIUM CHLORIDE 0.9% FLUSH: 3.0000 mL | INTRAVENOUS | Status: DC | PRN

## 2015-11-13 MED ORDER — SODIUM CHLORIDE 0.9% FLUSH
3.0000 mL | Freq: Two times a day (BID) | INTRAVENOUS | Status: DC
Start: 2015-11-13 — End: 2015-11-14
  Administered 2015-11-13 – 2015-11-14 (×2): 3 mL via INTRAVENOUS

## 2015-11-13 MED ORDER — SODIUM CHLORIDE 0.9 % IV SOLN: 250.0000 mL | INTRAVENOUS | Status: DC | PRN

## 2015-11-13 NOTE — Progress Notes (Signed)
Patient Profile: 82 y.o.male history of CAD with prior CABG in 1992, HL, HTN presents with SOB w/EKG findings of posterior MI. Patient admitted for STEMI, s/p cardiac cath w/ recommendation for protected PCI w/ Impella device due to poor systolic function, EF 123456 on cath.   Subjective: Pt has no complaints this morning. Denies SOB, chest pain, and palpitations.   Objective: Vital signs in last 24 hours: Temp:  [97.1 F (36.2 C)-98 F (36.7 C)] 98 F (36.7 C) (05/11 0727) Pulse Rate:  [62-73] 64 (05/11 0400) Resp:  [17-30] 23 (05/11 0727) BP: (99-154)/(56-95) 116/73 mmHg (05/11 0727) SpO2:  [90 %-94 %] 94 % (05/11 0727) Weight:  [205 lb 4.8 oz (93.123 kg)] 205 lb 4.8 oz (93.123 kg) (05/11 0500)    Intake/Output from previous day: 05/10 0701 - 05/11 0700 In: 360 [P.O.:360] Out: 825 [Urine:825] Intake/Output this shift:    Medications Current Facility-Administered Medications  Medication Dose Route Frequency Provider Last Rate Last Dose  . 0.9 %  sodium chloride infusion  250 mL Intravenous PRN [name], MD      . 0.9 %  sodium chloride infusion  250 mL Intravenous PRN [name], MD      . 0.9 %  sodium chloride infusion  250 mL Intravenous PRN [name], MD      . Derrill Memo ON 11/14/2015] 0.9 %  sodium chloride infusion   Intravenous Continuous [name], MD      . acetaminophen (TYLENOL) tablet 650 mg  650 mg Oral Q4H PRN [name], MD      . aspirin EC tablet 81 mg  81 mg Oral Daily [name], MD   81 mg at 11/12/15 1045  . atorvastatin (LIPITOR) tablet 40 mg  40 mg Oral q1800 [name], MD      . clopidogrel (PLAVIX) tablet 75 mg  75 mg Oral Q breakfast [name], MD   75 mg at 11/12/15 0805  . heparin injection 5,000 Units  5,000 Units Subcutaneous Q8H [name], MD   5,000 Units at 11/13/15 0600  . metoprolol succinate (TOPROL-XL) 24 hr tablet 25 mg  25 mg Oral BID [name], MD   25 mg at 11/12/15 2109  . nitroGLYCERIN  (NITROSTAT) SL tablet 0.4 mg  0.4 mg Sublingual Q5 Min x 3 PRN [name], MD      . ondansetron [facility]) injection 4 mg  4 mg Intravenous Q6H PRN [name], MD      . pantoprazole (PROTONIX) EC tablet 40 mg  40 mg Oral Daily [name], MD   40 mg at 11/12/15 1046  . sodium chloride flush (NS) 0.9 % injection 3 mL  3 mL Intravenous Q12H [name], MD   3 mL at 11/12/15 2200  . sodium chloride flush (NS) 0.9 % injection 3 mL  3 mL Intravenous PRN [name], MD      . sodium chloride flush (NS) 0.9 % injection 3 mL  3 mL Intravenous Q12H [name], MD   3 mL at 11/12/15 2200  . sodium chloride flush (NS) 0.9 % injection 3 mL  3 mL Intravenous PRN [name], MD      . sodium chloride flush (NS) 0.9 % injection 3 mL  3 mL Intravenous Q12H [name], MD      . sodium chloride flush (NS) 0.9 % injection 3 mL  3 mL Intravenous PRN [name], MD      . tamsulosin [facility]) capsule 0.4 mg  0.4 mg Oral QPC supper [name], MD   0.4 mg at 11/12/15 1827     PE: General appearance: alert, cooperative and no distress, sitting in recliner at bedside. Hard of hearing Lungs: clear to auscultation bilaterally, no wheezing or crackles Heart: regular rate and rhythm, S1, S2 normal, no murmur, click, rub or gallop Extremities: neg for pedal edema.  Pulses: 2+ and symmetric Skin: warm and dry Neurologic: Grossly normal  Lab Results:   Recent Labs  11/11/15 1401 11/11/15 1803 11/12/15 0432  WBC 9.8 8.5 6.9  HGB 17.5* 17.1* 15.8  HCT 49.7 50.0 45.9  PLT 88* 98* 89*   BMET  Recent Labs  11/11/15 1401 11/11/15 1803 11/12/15 0432  NA 139  --  142  K 3.9  --  3.5  CL 105  --  103  CO2 22  --  25  GLUCOSE 121*  --  102*  BUN 19  --  14  CREATININE 1.21 1.29* 1.15  CALCIUM 9.2  --  9.0   PT/INR  Recent Labs  11/13/15 0528  LABPROT 16.9*  INR 1.36   Cholesterol  Lipid Panel     Component Value Date/Time   CHOL 170 11/12/2015 0432    TRIG 207* 11/12/2015 0432   HDL 28* 11/12/2015 0432   CHOLHDL 6.1 11/12/2015 0432   VLDL 41* 11/12/2015 0432   LDLCALC 101* 11/12/2015 0432     Cardiac Panel (last 3 results)  Recent Labs  11/11/15 1803 11/11/15 2235 11/12/15 0432  TROPONINI 25.59* 27.18* 29.91*    Studies/Results: Procedures    Left Heart Cath and Coronary Angiography    Conclusion     Sequential SVG .  The first limb of the sequential SVG-diagonal/ramus intermedius is patent, but the second limb is occluded.  Origin lesion, before 1st Diag, 40% stenosed.  Prox RCA to Mid RCA lesion, 100% stenosed.  LIMA .  The LIMA is patent, but there is a tight lesion just beyond the insertion site in the mid-LAD  Mid LAD to Dist LAD lesion, 90% stenosed.  Ost LM lesion, 100% stenosed.  Ost Cx lesion, 100% stenosed.  There is severe left ventricular systolic dysfunction.  1. Severe native vessel CAD with total occlusion of the left main and RCA 2. S/P CABG with continued patency of the LIMA-LAD and first limb of the sequential SVG-diagonal 3. Total occlusion of the second limb of the SVG-ramus graft (culprit vessel) 4. Severe stenosis of the mid-LAD at the LIMA anastomotic site 5. Severe segmental LV systolic dysfunction  Tx CCU, post-MI medical therapy, 2D echo, start plavix 300 mg then 75 mg daily, consider PCI of the LIMA-LAD insertion. PCI will be high-risk in setting of recent infarct and severe LV dysfunction will need to consider hemodynamic support.   Left Heart    Left Ventricle There is severe left ventricular systolic dysfunction. The left ventricular ejection fraction is less than 25% by visual estimate. There is severe segmental LV dysfunction, with LVEF estimated 25%     Assessment/Plan  Active Problems:   Acute ST elevation myocardial infarction (STEMI) of posterior wall (HCC)   1. CAD/ STEMI: angiographic details outlined above. The culprit lesion for his STEMI is felt to be  total occulusion of the second limb of the SVG-ramus graft. There is also severe stenosis of the mid-LAD at the LIMA anastomotic site.  - protected PCI w/ impella device tomorrow, clear diet starting tomorrow morning prior to procedure - Continue ASA, Plavix, Lipitor and  metoprolol 25mg  BID.   2. HLD: LDL is 101 mg/dL. On Lipitor 40 mg, can increase lipitor to 80mg .   3. Severe LV Dysfunction: EF is 25% by cath. - consider repeat ECHO 48 hours after stent placement to assess EF, if still low can consider life vest  - continue BB.  - Consider stopping norvasc and starting low dose ACE-I.   4. BPH- continue flomax 0.4mg  qhs    LOS: 2 days    Julious Oka, MD 11/13/2015 8:12 AM  Attending Note:   The patient was seen and examined.  Agree with assessment and plan as noted above.  Changes made to the above note as needed.  He is doing well. 1. S/p posterior MI - due to occlusion of a sequential SVG. He was also found to have a tight LAD stenosis after the insertion of the LIMA. Dr. Burt Knack will be doing PCI of that tomorrow  2. Acute on chronic systolic chf:  Likely due to ischemic heart disease. Will start low dose Losartan after tomorrows cath .  I think he will need a Lifevest upon DC   Thayer Headings, Brooke Bonito., MD, [facility] 11/13/2015, 11:07 AM [address],  [place] Pager [phone]

## 2015-11-13 NOTE — Progress Notes (Signed)
Echocardiogram 2D Echocardiogram has been performed.  John [name] 11/13/2015, 3:00 PM

## 2015-11-14 ENCOUNTER — Encounter (HOSPITAL_COMMUNITY)
Admission: EM | Disposition: A | Discharge status: Home / Self Care | Payer: Self-pay | Source: Home / Self Care | Attending: Cardiovascular Disease

## 2015-11-14 DIAGNOSIS — I25810 Atherosclerosis of coronary artery bypass graft(s) without angina pectoris: Secondary | ICD-10-CM

## 2015-11-14 DIAGNOSIS — I2102 ST elevation (STEMI) myocardial infarction involving left anterior descending coronary artery: Secondary | ICD-10-CM

## 2015-11-14 DIAGNOSIS — E785 Hyperlipidemia, unspecified: Secondary | ICD-10-CM

## 2015-11-14 HISTORY — PX: CARDIAC CATHETERIZATION: SHX172

## 2015-11-14 LAB — POCT ACTIVATED CLOTTING TIME: Activated Clotting Time: 399 seconds

## 2015-11-14 SURGERY — CORONARY STENT INTERVENTION W/IMPELLA

## 2015-11-14 MED ORDER — BIVALIRUDIN 250 MG IV SOLR
250.0000 mg | INTRAVENOUS | Status: DC | PRN
  Administered 2015-11-14 (×2): 1.75 mg/kg/h via INTRAVENOUS

## 2015-11-14 MED ORDER — NITROGLYCERIN 1 MG/10 ML FOR IR/CATH LAB
INTRA_ARTERIAL | Status: AC
  Filled 2015-11-14: qty 10

## 2015-11-14 MED ORDER — NITROGLYCERIN 1 MG/10 ML FOR IR/CATH LAB
INTRA_ARTERIAL | Status: DC | PRN
  Administered 2015-11-14 (×2): 200 ug via INTRACORONARY

## 2015-11-14 MED ORDER — VERAPAMIL HCL 2.5 MG/ML IV SOLN
INTRAVENOUS | Status: DC | PRN
  Administered 2015-11-14: 14:00:00 via INTRA_ARTERIAL

## 2015-11-14 MED ORDER — IOPAMIDOL (ISOVUE-370) INJECTION 76%
INTRAVENOUS | Status: DC | PRN
  Administered 2015-11-14: 145 mL via INTRA_ARTERIAL

## 2015-11-14 MED ORDER — IOPAMIDOL (ISOVUE-370) INJECTION 76%
INTRAVENOUS | Status: AC
  Filled 2015-11-14: qty 125

## 2015-11-14 MED ORDER — LIDOCAINE-EPINEPHRINE 1 %-1:100000 IJ SOLN
INTRAMUSCULAR | Status: AC
  Filled 2015-11-14: qty 1

## 2015-11-14 MED ORDER — HEPARIN (PORCINE) IN NACL 2-0.9 UNIT/ML-% IJ SOLN
INTRAMUSCULAR | Status: DC | PRN
  Administered 2015-11-14: 1000 mL

## 2015-11-14 MED ORDER — LIDOCAINE HCL (PF) 1 % IJ SOLN
INTRAMUSCULAR | Status: AC
  Filled 2015-11-14: qty 30

## 2015-11-14 MED ORDER — SODIUM CHLORIDE 0.9 % IV SOLN
250.0000 mL | INTRAVENOUS | Status: DC | PRN
  Administered 2015-11-14: 250 mL via INTRAVENOUS

## 2015-11-14 MED ORDER — BIVALIRUDIN 250 MG IV SOLR
INTRAVENOUS | Status: AC
  Filled 2015-11-14: qty 250

## 2015-11-14 MED ORDER — FENTANYL CITRATE (PF) 100 MCG/2ML IJ SOLN
INTRAMUSCULAR | Status: AC
  Filled 2015-11-14: qty 2

## 2015-11-14 MED ORDER — MIDAZOLAM HCL 2 MG/2ML IJ SOLN
INTRAMUSCULAR | Status: DC | PRN
  Administered 2015-11-14 (×2): 1 mg via INTRAVENOUS

## 2015-11-14 MED ORDER — IOPAMIDOL (ISOVUE-370) INJECTION 76%
INTRAVENOUS | Status: AC
  Filled 2015-11-14: qty 50

## 2015-11-14 MED ORDER — SODIUM CHLORIDE 0.9 % IV SOLN: INTRAVENOUS | Status: AC

## 2015-11-14 MED ORDER — BUPIVACAINE HCL (PF) 0.25 % IJ SOLN
INTRAMUSCULAR | Status: DC | PRN
  Administered 2015-11-14: 5 mL

## 2015-11-14 MED ORDER — LIDOCAINE HCL (PF) 1 % IJ SOLN
INTRAMUSCULAR | Status: DC | PRN
  Administered 2015-11-14: 32 mL

## 2015-11-14 MED ORDER — MIDAZOLAM HCL 2 MG/2ML IJ SOLN
INTRAMUSCULAR | Status: AC
  Filled 2015-11-14: qty 2

## 2015-11-14 MED ORDER — VERAPAMIL HCL 2.5 MG/ML IV SOLN
INTRAVENOUS | Status: AC
  Filled 2015-11-14: qty 2

## 2015-11-14 MED ORDER — FENTANYL CITRATE (PF) 100 MCG/2ML IJ SOLN
INTRAMUSCULAR | Status: DC | PRN
  Administered 2015-11-14 (×2): 25 ug via INTRAVENOUS

## 2015-11-14 MED ORDER — SODIUM CHLORIDE 0.9% FLUSH: 3.0000 mL | INTRAVENOUS | Status: DC | PRN

## 2015-11-14 MED ORDER — HEPARIN (PORCINE) IN NACL 2-0.9 UNIT/ML-% IJ SOLN
INTRAMUSCULAR | Status: AC
  Filled 2015-11-14: qty 1000

## 2015-11-14 MED ORDER — BIVALIRUDIN BOLUS VIA INFUSION - CUPID
INTRAVENOUS | Status: DC | PRN
  Administered 2015-11-14: 65.7 mg via INTRAVENOUS

## 2015-11-14 MED ORDER — SODIUM CHLORIDE 0.9% FLUSH
3.0000 mL | Freq: Two times a day (BID) | INTRAVENOUS | Status: DC
  Administered 2015-11-15 – 2015-11-16 (×3): 3 mL via INTRAVENOUS

## 2015-11-14 SURGICAL SUPPLY — 29 items
BALLN EUPHORA RX 2.0X15 (BALLOONS) ×2
BALLN EUPHORA RX 2.5X15 (BALLOONS) ×2
BALLN ~~LOC~~ EUPHORA RX 2.75X8 (BALLOONS) ×2
BALLOON EUPHORA RX 2.0X15 (BALLOONS) IMPLANT
BALLOON EUPHORA RX 2.5X15 (BALLOONS) IMPLANT
BALLOON ~~LOC~~ EUPHORA RX 2.75X8 (BALLOONS) IMPLANT
CATH INFINITI 5 FR 3DRC (CATHETERS) ×1 IMPLANT
CATH INFINITI 5FR ANG PIGTAIL (CATHETERS) ×1 IMPLANT
CATH VISTA GUIDE 6FR IM 90 CM (CATHETERS) ×1 IMPLANT
DEVICE CLOSURE PERCLS PRGLD 6F (VASCULAR PRODUCTS) IMPLANT
DEVICE RAD COMP TR BAND LRG (VASCULAR PRODUCTS) ×1 IMPLANT
ELECT DEFIB PAD ADLT CADENCE (PAD) ×1 IMPLANT
GLIDESHEATH SLEND SS 6F .021 (SHEATH) ×1 IMPLANT
GLIDEWIRE ANGLED SS 035X260CM (WIRE) ×1 IMPLANT
GUIDEWIRE .025 260CM (WIRE) ×1 IMPLANT
KIT ENCORE 26 ADVANTAGE (KITS) ×1 IMPLANT
KIT HEART LEFT (KITS) ×2 IMPLANT
PACK CARDIAC CATHETERIZATION (CUSTOM PROCEDURE TRAY) ×2 IMPLANT
PERCLOSE PROGLIDE 6F (VASCULAR PRODUCTS) ×6
SET IMPELLA CP PUMP (CATHETERS) ×1 IMPLANT
SET INTRODUCER MICROPUNCT 5F (INTRODUCER) ×1 IMPLANT
SHEATH PINNACLE 6F 10CM (SHEATH) ×1 IMPLANT
STENT RESOLUTE INTEG 2.5X18 (Permanent Stent) ×1 IMPLANT
TRANSDUCER W/STOPCOCK (MISCELLANEOUS) ×2 IMPLANT
TUBING CIL FLEX 10 FLL-RA (TUBING) ×2 IMPLANT
WIRE COUGAR XT STRL 190CM (WIRE) ×1 IMPLANT
WIRE EMERALD 3MM-J .035X150CM (WIRE) ×1 IMPLANT
WIRE HI TORQ WHISPER MS 190CM (WIRE) ×1 IMPLANT
WIRE SAFE-T 1.5MM-J .035X260CM (WIRE) ×1 IMPLANT

## 2015-11-14 NOTE — Progress Notes (Signed)
Patient Profile: 82 y.o.male history of CAD with prior CABG in 1992, HL, HTN presents with SOB w/EKG findings of posterior MI. Patient admitted for STEMI, s/p cardiac cath w/ recommendation for protected PCI w/ Impella device due to poor systolic function, EF 123456 on cath, recent ECHO 5/11 reveals improved EF of 40-45%.  Subjective: Pt has no complaints, denies chest pain, SOB, and chest palpitations. Telemetry reveals vtach, reviewed w/ Dr. Acie Fredrickson and this appears to be motion artifact.   Objective: Vital signs in last 24 hours: Temp:  [97.4 F (36.3 C)-98.4 F (36.9 C)] 97.8 F (36.6 C) (05/12 0753) Resp:  [15-27] 21 (05/12 0753) BP: (120-146)/(70-90) 122/73 mmHg (05/12 0753) SpO2:  [91 %-97 %] 91 % (05/12 0753) Weight:  [193 lb 2 oz (87.6 kg)] 193 lb 2 oz (87.6 kg) (05/12 0500) Last BM Date: 11/12/15  Intake/Output from previous day: 05/11 0701 - 05/12 0700 In: 1203 [P.O.:1200; I.V.:3] Out: 650 [Urine:650] Intake/Output this shift: Total I/O In: -  Out: 300 [Urine:300]  Medications Current Facility-Administered Medications  Medication Dose Route Frequency Provider Last Rate Last Dose  . 0.9 %  sodium chloride infusion  250 mL Intravenous PRN [name], MD      . 0.9 %  sodium chloride infusion  250 mL Intravenous PRN [name], MD      . 0.9 %  sodium chloride infusion  250 mL Intravenous PRN [name], MD      . 0.9 %  sodium chloride infusion   Intravenous Continuous [name], MD      . acetaminophen (TYLENOL) tablet 650 mg  650 mg Oral Q4H PRN [name], MD      . aspirin EC tablet 81 mg  81 mg Oral Daily [name], MD   81 mg at 11/13/15 1034  . atorvastatin (LIPITOR) tablet 40 mg  40 mg Oral q1800 [name], MD   40 mg at 11/13/15 1725  . clopidogrel (PLAVIX) tablet 75 mg  75 mg Oral Q breakfast [name], MD   75 mg at 11/13/15 1034  . heparin injection 5,000 Units  5,000 Units Subcutaneous Q8H [name], MD   5,000  Units at 11/14/15 0518  . metoprolol succinate (TOPROL-XL) 24 hr tablet 25 mg  25 mg Oral BID [name], MD   25 mg at 11/13/15 2122  . nitroGLYCERIN (NITROSTAT) SL tablet 0.4 mg  0.4 mg Sublingual Q5 Min x 3 PRN [name], MD      . ondansetron [facility]) injection 4 mg  4 mg Intravenous Q6H PRN [name], MD      . pantoprazole (PROTONIX) EC tablet 40 mg  40 mg Oral Daily [name], MD   40 mg at 11/13/15 1034  . sodium chloride flush (NS) 0.9 % injection 3 mL  3 mL Intravenous Q12H [name], MD   3 mL at 11/13/15 2200  . sodium chloride flush (NS) 0.9 % injection 3 mL  3 mL Intravenous PRN [name], MD      . sodium chloride flush (NS) 0.9 % injection 3 mL  3 mL Intravenous Q12H [name], MD   3 mL at 11/13/15 2200  . sodium chloride flush (NS) 0.9 % injection 3 mL  3 mL Intravenous PRN [name], MD      . sodium chloride flush (NS) 0.9 % injection 3 mL  3 mL Intravenous Q12H [name], MD   3 mL at 11/13/15 1034  . sodium chloride flush (  NS) 0.9 % injection 3 mL  3 mL Intravenous PRN [name], MD      . tamsulosin [facility]) capsule 0.4 mg  0.4 mg Oral QPC supper [name], MD   0.4 mg at 11/13/15 1725     PE: General appearance: pleasant, alert, cooperative and no distress. Hard of hearing Lungs: clear to auscultation bilaterally, no wheezing or crackles Heart: regular rate and rhythm, S1, S2 normal, no murmur, click, rub or gallop Extremities: neg for pedal edema.  Skin: warm and dry Neurologic: Grossly normal  Lab Results:   Recent Labs  11/11/15 1401 11/11/15 1803 11/12/15 0432  WBC 9.8 8.5 6.9  HGB 17.5* 17.1* 15.8  HCT 49.7 50.0 45.9  PLT 88* 98* 89*   BMET  Recent Labs  11/11/15 1401 11/11/15 1803 11/12/15 0432  NA 139  --  142  K 3.9  --  3.5  CL 105  --  103  CO2 22  --  25  GLUCOSE 121*  --  102*  BUN 19  --  14  CREATININE 1.21 1.29* 1.15  CALCIUM 9.2  --  9.0   PT/INR  Recent Labs   11/13/15 0528  LABPROT 16.9*  INR 1.36   Cholesterol  Lipid Panel     Component Value Date/Time   CHOL 170 11/12/2015 0432   TRIG 207* 11/12/2015 0432   HDL 28* 11/12/2015 0432   CHOLHDL 6.1 11/12/2015 0432   VLDL 41* 11/12/2015 0432   LDLCALC 101* 11/12/2015 0432     Cardiac Panel (last 3 results)  Recent Labs  11/11/15 1803 11/11/15 2235 11/12/15 0432  TROPONINI 25.59* 27.18* 29.91*    Studies/Results: Procedures    Left Heart Cath and Coronary Angiography    Conclusion     Sequential SVG .  The first limb of the sequential SVG-diagonal/ramus intermedius is patent, but the second limb is occluded.  Origin lesion, before 1st Diag, 40% stenosed.  Prox RCA to Mid RCA lesion, 100% stenosed.  LIMA .  The LIMA is patent, but there is a tight lesion just beyond the insertion site in the mid-LAD  Mid LAD to Dist LAD lesion, 90% stenosed.  Ost LM lesion, 100% stenosed.  Ost Cx lesion, 100% stenosed.  There is severe left ventricular systolic dysfunction.  1. Severe native vessel CAD with total occlusion of the left main and RCA 2. S/P CABG with continued patency of the LIMA-LAD and first limb of the sequential SVG-diagonal 3. Total occlusion of the second limb of the SVG-ramus graft (culprit vessel) 4. Severe stenosis of the mid-LAD at the LIMA anastomotic site 5. Severe segmental LV systolic dysfunction  Tx CCU, post-MI medical therapy, 2D echo, start plavix 300 mg then 75 mg daily, consider PCI of the LIMA-LAD insertion. PCI will be high-risk in setting of recent infarct and severe LV dysfunction will need to consider hemodynamic support.   Left Heart    Left Ventricle There is severe left ventricular systolic dysfunction. The left ventricular ejection fraction is less than 25% by visual estimate. There is severe segmental LV dysfunction, with LVEF estimated 25%     Assessment/Plan  Active Problems:   Acute ST elevation myocardial infarction  (STEMI) of posterior wall (HCC)   1. CAD/ STEMI: angiographic details outlined above. The culprit lesion for his STEMI is felt to be total occulusion of the second limb of the SVG-ramus graft. There is also severe stenosis of the mid-LAD at the LIMA anastomotic site. ECHO yesterday revealed EF  of 40-45%, Akinesis of the inferolateral wall and hypokinesis of the inferior wall; overall mild to moderate LV dysfunction; grade 2 diastolic dysfunction with elevated LV filling pressure. - protected PCI w/ impella device today - Continue ASA, Plavix, Lipitor and metoprolol 25mg  BID.  - start losartan for afterload reduction after cath procedure.   2. HLD: LDL is 101 mg/dL. On Lipitor 40 mg  3. Severe LV Dysfunction: EF is 25% by cath. EF 40-45% on ECHO 5/11 - continue BB.  - will start on ACE/ARB after cath procedure - consider life vest on discharge, may not need this now that his EF has improved to 40-45%.   4. BPH- continue flomax 0.4mg  qhs    LOS: 3 days    Julious Oka, MD 11/14/2015 8:31 AM   Attending Note:   The patient was seen and examined.  Agree with assessment and plan as noted above.  Changes made to the above note as needed.  Pt is scheduled for PCI of his LAD today   His LV function has improved.  I would not think that he needs a LifeVest at this point     Ramond Dial., MD, [facility] 11/14/2015, 12:18 PM [address],  [place] Pager [phone]

## 2015-11-14 NOTE — H&P (View-Only) (Signed)
John [name] Profile: 82 y.o.male history of CAD with prior CABG in 1992, HL, HTN presents with SOB w/EKG findings of posterior MI. John [name] admitted for STEMI, s/p cardiac cath w/ recommendation for protected PCI w/ Impella device due to poor systolic function, EF 123456 on cath, recent ECHO 5/11 reveals improved EF of 40-45%.  Subjective: Pt has no complaints, denies chest pain, SOB, and chest palpitations. Telemetry reveals vtach, reviewed w/ Dr. Acie Fredrickson and this appears to be motion artifact.   Objective: Vital signs in last 24 hours: Temp:  [97.4 F (36.3 C)-98.4 F (36.9 C)] 97.8 F (36.6 C) (05/12 0753) Resp:  [15-27] 21 (05/12 0753) BP: (120-146)/(70-90) 122/73 mmHg (05/12 0753) SpO2:  [91 %-97 %] 91 % (05/12 0753) Weight:  [193 lb 2 oz (87.6 kg)] 193 lb 2 oz (87.6 kg) (05/12 0500) Last BM Date: 11/12/15  Intake/Output from previous day: 05/11 0701 - 05/12 0700 In: 1203 [P.O.:1200; I.V.:3] Out: 650 [Urine:650] Intake/Output this shift: Total I/O In: -  Out: 300 [Urine:300]  Medications Current Facility-Administered Medications  Medication Dose Route Frequency Provider Last Rate Last Dose  . 0.9 %  sodium chloride infusion  250 mL Intravenous PRN [name], MD      . 0.9 %  sodium chloride infusion  250 mL Intravenous PRN [name], MD      . 0.9 %  sodium chloride infusion  250 mL Intravenous PRN [name], MD      . 0.9 %  sodium chloride infusion   Intravenous Continuous [name], MD      . acetaminophen (TYLENOL) tablet 650 mg  650 mg Oral Q4H PRN [name], MD      . aspirin EC tablet 81 mg  81 mg Oral Daily [name], MD   81 mg at 11/13/15 1034  . atorvastatin (LIPITOR) tablet 40 mg  40 mg Oral q1800 [name], MD   40 mg at 11/13/15 1725  . clopidogrel (PLAVIX) tablet 75 mg  75 mg Oral Q breakfast [name], MD   75 mg at 11/13/15 1034  . heparin injection 5,000 Units  5,000 Units Subcutaneous Q8H [name], MD   5,000  Units at 11/14/15 0518  . metoprolol succinate (TOPROL-XL) 24 hr tablet 25 mg  25 mg Oral BID [name], MD   25 mg at 11/13/15 2122  . nitroGLYCERIN (NITROSTAT) SL tablet 0.4 mg  0.4 mg Sublingual Q5 Min x 3 PRN [name], MD      . ondansetron [facility]) injection 4 mg  4 mg Intravenous Q6H PRN [name], MD      . pantoprazole (PROTONIX) EC tablet 40 mg  40 mg Oral Daily [name], MD   40 mg at 11/13/15 1034  . sodium chloride flush (NS) 0.9 % injection 3 mL  3 mL Intravenous Q12H [name], MD   3 mL at 11/13/15 2200  . sodium chloride flush (NS) 0.9 % injection 3 mL  3 mL Intravenous PRN [name], MD      . sodium chloride flush (NS) 0.9 % injection 3 mL  3 mL Intravenous Q12H [name], MD   3 mL at 11/13/15 2200  . sodium chloride flush (NS) 0.9 % injection 3 mL  3 mL Intravenous PRN [name], MD      . sodium chloride flush (NS) 0.9 % injection 3 mL  3 mL Intravenous Q12H [name], MD   3 mL at 11/13/15 1034  . sodium chloride flush (  NS) 0.9 % injection 3 mL  3 mL Intravenous PRN [name], MD      . tamsulosin [facility]) capsule 0.4 mg  0.4 mg Oral QPC supper [name], MD   0.4 mg at 11/13/15 1725     PE: General appearance: pleasant, alert, cooperative and no distress. Hard of hearing Lungs: clear to auscultation bilaterally, no wheezing or crackles Heart: regular rate and rhythm, S1, S2 normal, no murmur, click, rub or gallop Extremities: neg for pedal edema.  Skin: warm and dry Neurologic: Grossly normal  Lab Results:   Recent Labs  11/11/15 1401 11/11/15 1803 11/12/15 0432  WBC 9.8 8.5 6.9  HGB 17.5* 17.1* 15.8  HCT 49.7 50.0 45.9  PLT 88* 98* 89*   BMET  Recent Labs  11/11/15 1401 11/11/15 1803 11/12/15 0432  NA 139  --  142  K 3.9  --  3.5  CL 105  --  103  CO2 22  --  25  GLUCOSE 121*  --  102*  BUN 19  --  14  CREATININE 1.21 1.29* 1.15  CALCIUM 9.2  --  9.0   PT/INR  Recent Labs   11/13/15 0528  LABPROT 16.9*  INR 1.36   Cholesterol  Lipid Panel     Component Value Date/Time   CHOL 170 11/12/2015 0432   TRIG 207* 11/12/2015 0432   HDL 28* 11/12/2015 0432   CHOLHDL 6.1 11/12/2015 0432   VLDL 41* 11/12/2015 0432   LDLCALC 101* 11/12/2015 0432     Cardiac Panel (last 3 results)  Recent Labs  11/11/15 1803 11/11/15 2235 11/12/15 0432  TROPONINI 25.59* 27.18* 29.91*    Studies/Results: Procedures    Left Heart Cath and Coronary Angiography    Conclusion     Sequential SVG .  The first limb of the sequential SVG-diagonal/ramus intermedius is patent, but the second limb is occluded.  Origin lesion, before 1st Diag, 40% stenosed.  Prox RCA to Mid RCA lesion, 100% stenosed.  LIMA .  The LIMA is patent, but there is a tight lesion just beyond the insertion site in the mid-LAD  Mid LAD to Dist LAD lesion, 90% stenosed.  Ost LM lesion, 100% stenosed.  Ost Cx lesion, 100% stenosed.  There is severe left ventricular systolic dysfunction.  1. Severe native vessel CAD with total occlusion of the left main and RCA 2. S/P CABG with continued patency of the LIMA-LAD and first limb of the sequential SVG-diagonal 3. Total occlusion of the second limb of the SVG-ramus graft (culprit vessel) 4. Severe stenosis of the mid-LAD at the LIMA anastomotic site 5. Severe segmental LV systolic dysfunction  Tx CCU, post-MI medical therapy, 2D echo, start plavix 300 mg then 75 mg daily, consider PCI of the LIMA-LAD insertion. PCI will be high-risk in setting of recent infarct and severe LV dysfunction will need to consider hemodynamic support.   Left Heart    Left Ventricle There is severe left ventricular systolic dysfunction. The left ventricular ejection fraction is less than 25% by visual estimate. There is severe segmental LV dysfunction, with LVEF estimated 25%     Assessment/Plan  Active Problems:   Acute ST elevation myocardial infarction  (STEMI) of posterior wall (HCC)   1. CAD/ STEMI: angiographic details outlined above. The culprit lesion for his STEMI is felt to be total occulusion of the second limb of the SVG-ramus graft. There is also severe stenosis of the mid-LAD at the LIMA anastomotic site. ECHO yesterday revealed EF  of 40-45%, Akinesis of the inferolateral wall and hypokinesis of the inferior wall; overall mild to moderate LV dysfunction; grade 2 diastolic dysfunction with elevated LV filling pressure. - protected PCI w/ impella device today - Continue ASA, Plavix, Lipitor and metoprolol 25mg  BID.  - start losartan for afterload reduction after cath procedure.   2. HLD: LDL is 101 mg/dL. On Lipitor 40 mg  3. Severe LV Dysfunction: EF is 25% by cath. EF 40-45% on ECHO 5/11 - continue BB.  - will start on ACE/ARB after cath procedure - consider life vest on discharge, may not need this now that his EF has improved to 40-45%.   4. BPH- continue flomax 0.4mg  qhs    LOS: 3 days    Julious Oka, MD 11/14/2015 8:31 AM   Attending Note:   The John [name] was seen and examined.  Agree with assessment and plan as noted above.  Changes made to the above note as needed.  Pt is scheduled for PCI of his LAD today   His LV function has improved.  I would not think that he needs a LifeVest at this point     Ramond Dial., MD, [facility] 11/14/2015, 12:18 PM [address]  [place] Pager [phone]

## 2015-11-14 NOTE — Research (Signed)
LEADERS FREE II Informed Consent   Subject Name: John [name]  Subject met inclusion and exclusion criteria.  The informed consent form, study requirements and expectations were reviewed with the subject and questions and concerns were addressed prior to the signing of the consent form.  The subject verbalized understanding of the trial requirements.  The subject agreed to participate in the LEADERS FREE II trial and signed the informed consent.  The informed consent was obtained prior to performance of any protocol-specific procedures for the subject.  A copy of the signed informed consent was given to the subject and a copy was placed in the subject's medical record.  [name] 11/14/2015, 9:25 AM

## 2015-11-14 NOTE — Care Management Important Message (Signed)
Important Message  Patient Details  Name: John [name] MRN: [id number] Date of Birth: 1930/06/04   Medicare Important Message Given:  Yes    [name] P [place] 11/14/2015, 1:22 PM

## 2015-11-14 NOTE — Interval H&P Note (Signed)
Cath Lab Visit (complete for each Cath Lab visit)  Clinical Evaluation Leading to the Procedure:   ACS: Yes.    Non-ACS:    Anginal Classification: CCS IV  Anti-ischemic medical therapy: Minimal Therapy (1 class of medications)  Non-Invasive Test Results: No non-invasive testing performed  Prior CABG: Previous CABG      History and Physical Interval Note:  11/14/2015 1:17 PM  John [name]  has presented today for surgery, with the diagnosis of cad  The various methods of treatment have been discussed with the patient and family. After consideration of risks, benefits and other options for treatment, the patient has consented to  Procedure(s): Coronary Stent Intervention w/Impella (N/A) as a surgical intervention .  The patient's history has been reviewed, patient examined, no change in status, stable for surgery.  I have reviewed the patient's chart and labs.  Questions were answered to the patient's satisfaction.     [name]

## 2015-11-15 DIAGNOSIS — I25111 Atherosclerotic heart disease of native coronary artery with angina pectoris with documented spasm: Secondary | ICD-10-CM

## 2015-11-15 LAB — BASIC METABOLIC PANEL
Anion gap: 12 (ref 5–15)
BUN: 14 mg/dL (ref 6–20)
CO2: 22 mmol/L (ref 22–32)
Calcium: 8.9 mg/dL (ref 8.9–10.3)
Chloride: 104 mmol/L (ref 101–111)
Creatinine, Ser: 1.15 mg/dL (ref 0.61–1.24)
GFR calc Af Amer: >60 mL/min (ref >60)
GFR calc non Af Amer: 57 mL/min — ABNORMAL LOW (ref >60)
Glucose, Bld: 113 mg/dL — ABNORMAL HIGH (ref 65–99)
Potassium: 3.8 mmol/L (ref 3.5–5.1)
Sodium: 138 mmol/L (ref 135–145)

## 2015-11-15 LAB — CBC
HCT: 44.5 % (ref 39.0–52.0)
Hemoglobin: 15 g/dL (ref 13.0–17.0)
MCH: 30.7 pg (ref 26.0–34.0)
MCHC: 33.7 g/dL (ref 30.0–36.0)
MCV: 91 fL (ref 78.0–100.0)
Platelets: 61 10*3/uL — ABNORMAL LOW (ref 150–400)
RBC: 4.89 MIL/uL (ref 4.22–5.81)
RDW: 13.2 % (ref 11.5–15.5)
WBC: 6.5 10*3/uL (ref 4.0–10.5)

## 2015-11-15 NOTE — Progress Notes (Signed)
CARDIAC REHAB PHASE I   PRE:  Rate/Rhythm: Sinus 71  BP:  Supine: 130/80       SaO2: 92% RA 96% on Room air with portable pulse oximeter  MODE:  Ambulation: 350 ft   POST:  Rate/Rhythem: Sinus 75  BP:    Sitting: 132/80     SaO2: 96% Room Air 1345-1440 Patient ambulated in the hallway using rolling walker without complaints or chest pain. Patient's oxygen saturation was noted at 88% but once we used another pulse oximeter, John [name]'s oxygen saturations were noted to be between 93%-96% on 2l/min of oxygen and on room air. Oxygen was discontinued. Reviewed John [name] exercise guidelines, heart healthy diet information and heart attack booklet with John [name] due to the patient being hard of hearing. John [name] is interested in participating in phase 2 cardiac rehab at [facility]

## 2015-11-15 NOTE — Progress Notes (Signed)
Patient Profile: 82 y.o.male history of CAD with prior CABG in 1992, HL, HTN presents with SOB w/EKG findings of posterior MI. Patient admitted for STEMI, s/p cardiac cath w/ recommendation for protected PCI w/ Impella device due to poor systolic function, EF 123456 on cath, recent ECHO 5/11 reveals improved EF of 40-45%.  Subjective: Pt has no complaints, denies chest pain, SOB, and chest palpitations.  Up eating breakfast this am   He is s/p stenting of the LAD just distal to the anastimosis of the LIMA.   The procedure was done with Impella support.   Objective: Vital signs in last 24 hours: Temp:  [97.3 F (36.3 C)-98 F (36.7 C)] 97.8 F (36.6 C) (05/13 0806) Pulse Rate:  [0-240] 0 (05/12 1514) Resp:  [0-31] 22 (05/13 0806) BP: (86-143)/(53-98) 110/67 mmHg (05/13 0806) SpO2:  [0 %-96 %] 94 % (05/13 0806) Last BM Date: 11/12/15  Intake/Output from previous day: 05/12 0701 - 05/13 0700 In: 319.7 [P.O.:180; I.V.:126.8] Out: 1550 [Urine:1550] Intake/Output this shift: Total I/O In: -  Out: 300 [Urine:300]  Medications Current Facility-Administered Medications  Medication Dose Route Frequency Provider Last Rate Last Dose  . 0.9 %  sodium chloride infusion  250 mL Intravenous PRN [name], MD   Stopped at 11/15/15 0300  . acetaminophen (TYLENOL) tablet 650 mg  650 mg Oral Q4H PRN [name], MD   650 mg at 11/15/15 0311  . aspirin EC tablet 81 mg  81 mg Oral Daily [name], MD   81 mg at 11/14/15 0857  . atorvastatin (LIPITOR) tablet 40 mg  40 mg Oral q1800 [name], MD   40 mg at 11/14/15 2154  . clopidogrel (PLAVIX) tablet 75 mg  75 mg Oral Q breakfast [name], MD   75 mg at 11/14/15 0857  . heparin injection 5,000 Units  5,000 Units Subcutaneous Q8H [name], MD   5,000 Units at 11/15/15 0636  . metoprolol succinate (TOPROL-XL) 24 hr tablet 25 mg  25 mg Oral BID [name], MD   25 mg at 11/14/15 2153  . nitroGLYCERIN (NITROSTAT) SL  tablet 0.4 mg  0.4 mg Sublingual Q5 Min x 3 PRN [name], MD      . ondansetron [facility]) injection 4 mg  4 mg Intravenous Q6H PRN [name], MD      . pantoprazole (PROTONIX) EC tablet 40 mg  40 mg Oral Daily [name], MD   40 mg at 11/14/15 0856  . sodium chloride flush (NS) 0.9 % injection 3 mL  3 mL Intravenous Q12H [name], MD      . sodium chloride flush (NS) 0.9 % injection 3 mL  3 mL Intravenous PRN [name], MD      . tamsulosin [facility]) capsule 0.4 mg  0.4 mg Oral QPC supper [name], MD   0.4 mg at 11/14/15 2153     PE: General appearance: pleasant, alert, cooperative and no distress. Hard of hearing Lungs: clear to auscultation bilaterally, no wheezing or crackles Heart: regular rate and rhythm, S1, S2 normal, no murmur, click, rub or gallop Extremities: neg for pedal edema.  Groin cath sites are stable  Skin: warm and dry Neurologic: Grossly normal  Lab Results:   Recent Labs  11/15/15 0439  WBC 6.5  HGB 15.0  HCT 44.5  PLT 61*   BMET  Recent Labs  11/15/15 0439  NA 138  K 3.8  CL 104  CO2 22  GLUCOSE 113*  BUN 14  CREATININE 1.15  CALCIUM 8.9   PT/INR  Recent Labs  11/13/15 0528  LABPROT 16.9*  INR 1.36   Cholesterol  Lipid Panel     Component Value Date/Time   CHOL 170 11/12/2015 0432   TRIG 207* 11/12/2015 0432   HDL 28* 11/12/2015 0432   CHOLHDL 6.1 11/12/2015 0432   VLDL 41* 11/12/2015 0432   LDLCALC 101* 11/12/2015 0432     Cardiac Panel (last 3 results) No results for input(s): CKTOTAL, CKMB, TROPONINI, RELINDX in the last 72 hours.  Studies/Results: Procedures    Left Heart Cath and Coronary Angiography    Conclusion     Sequential SVG .  The first limb of the sequential SVG-diagonal/ramus intermedius is patent, but the second limb is occluded.  Origin lesion, before 1st Diag, 40% stenosed.  Prox RCA to Mid RCA lesion, 100% stenosed.  LIMA .  The LIMA is patent, but there is  a tight lesion just beyond the insertion site in the mid-LAD  Mid LAD to Dist LAD lesion, 90% stenosed.  Ost LM lesion, 100% stenosed.  Ost Cx lesion, 100% stenosed.  There is severe left ventricular systolic dysfunction.  1. Severe native vessel CAD with total occlusion of the left main and RCA 2. S/P CABG with continued patency of the LIMA-LAD and first limb of the sequential SVG-diagonal 3. Total occlusion of the second limb of the SVG-ramus graft (culprit vessel) 4. Severe stenosis of the mid-LAD at the LIMA anastomotic site 5. Severe segmental LV systolic dysfunction  Tx CCU, post-MI medical therapy, 2D echo, start plavix 300 mg then 75 mg daily, consider PCI of the LIMA-LAD insertion. PCI will be high-risk in setting of recent infarct and severe LV dysfunction will need to consider hemodynamic support.   Left Heart    Left Ventricle There is severe left ventricular systolic dysfunction. The left ventricular ejection fraction is less than 25% by visual estimate. There is severe segmental LV dysfunction, with LVEF estimated 25%     Assessment/Plan  Active Problems:   Acute ST elevation myocardial infarction (STEMI) of posterior wall (HCC)   1. CAD/ STEMI: angiographic details outlined above. The culprit lesion for his STEMI is felt to be total occulusion of the second limb of the SVG-ramus graft. He is now s/p stenting of the mid LAD just distal to the anastimosis of the LIMA He did very well Will transfer to [place] tomorrow  2. HLD: LDL is 101 mg/dL. On Lipitor 40 mg  3. Severe LV Dysfunction: EF is 25% by cath. EF 40-45% on ECHO 5/11 - continue BB.  - will start on ACE/ARB after cath procedure - consider life vest on discharge, may not need this now that his EF has improved to 40-45%.   4. BPH- continue flomax 0.4mg  qhs    LOS: 4 days     Mertie Moores, MD  11/15/2015 8:25 AM    [facility] [place],  [address] [place], [place]  [address] Pager [phone] Phone: [phone]; Fax: [phone]

## 2015-11-16 MED ORDER — NITROGLYCERIN 0.4 MG SL SUBL: 0.4000 mg | SUBLINGUAL_TABLET | SUBLINGUAL | Status: AC | PRN

## 2015-11-16 MED ORDER — LOSARTAN POTASSIUM 25 MG PO TABS
25.0000 mg | ORAL_TABLET | Freq: Every day | ORAL | Status: DC
Start: 2015-11-16 — End: 2015-12-02

## 2015-11-16 MED ORDER — ACETAMINOPHEN 325 MG PO TABS: 650.0000 mg | ORAL_TABLET | ORAL | Status: DC | PRN

## 2015-11-16 MED ORDER — ATORVASTATIN CALCIUM 40 MG PO TABS: 40.0000 mg | ORAL_TABLET | Freq: Every day | ORAL | Status: DC

## 2015-11-16 MED ORDER — CLOPIDOGREL BISULFATE 75 MG PO TABS: 75.0000 mg | ORAL_TABLET | Freq: Every day | ORAL | Status: DC

## 2015-11-16 MED ORDER — LOSARTAN POTASSIUM 25 MG PO TABS
25.0000 mg | ORAL_TABLET | Freq: Every day | ORAL | Status: DC
  Administered 2015-11-16: 25 mg via ORAL
  Filled 2015-11-16: qty 1

## 2015-11-16 MED ORDER — METOPROLOL SUCCINATE ER 25 MG PO TB24: 25.0000 mg | ORAL_TABLET | Freq: Two times a day (BID) | ORAL | Status: DC

## 2015-11-16 NOTE — Progress Notes (Addendum)
Patient Profile: 82 y.o.male history of CAD with prior CABG in 1992, HL, HTN presents with SOB w/EKG findings of posterior MI. Patient admitted for STEMI, s/p cardiac cath w/ recommendation for protected PCI w/ Impella device due to poor systolic function, EF 123456 on cath, recent ECHO 5/11 reveals improved EF of 40-45%.  Subjective: Pt has no complaints, denies chest pain, SOB, and chest palpitations.  Up eating breakfast this am   He is s/p stenting of the LAD just distal to the anastimosis of the LIMA.   The procedure was done with Impella support. He has done well overnight.    Objective: Vital signs in last 24 hours: Temp:  [97.4 F (36.3 C)-99.1 F (37.3 C)] 97.6 F (36.4 C) (05/14 0807) Resp:  [16-24] 23 (05/14 0807) BP: (109-163)/(61-127) 113/72 mmHg (05/14 0807) SpO2:  [88 %-95 %] 94 % (05/14 0807) Weight:  [203 lb 0.7 oz (92.1 kg)] 203 lb 0.7 oz (92.1 kg) (05/14 0500) Last BM Date: 11/15/15  Intake/Output from previous day: 05/13 0701 - 05/14 0700 In: 480 [P.O.:480] Out: 1400 [Urine:1400] Intake/Output this shift:    Medications Current Facility-Administered Medications  Medication Dose Route Frequency Provider Last Rate Last Dose  . 0.9 %  sodium chloride infusion  250 mL Intravenous PRN [name], MD   Stopped at 11/15/15 0300  . acetaminophen (TYLENOL) tablet 650 mg  650 mg Oral Q4H PRN [name], MD   650 mg at 11/15/15 0311  . aspirin EC tablet 81 mg  81 mg Oral Daily [name], MD   81 mg at 11/15/15 0940  . atorvastatin (LIPITOR) tablet 40 mg  40 mg Oral q1800 [name], MD   40 mg at 11/15/15 1720  . clopidogrel (PLAVIX) tablet 75 mg  75 mg Oral Q breakfast [name], MD   75 mg at 11/15/15 0940  . heparin injection 5,000 Units  5,000 Units Subcutaneous Q8H [name], MD   5,000 Units at 11/16/15 [phone]  . metoprolol succinate (TOPROL-XL) 24 hr tablet 25 mg  25 mg Oral BID [name], MD   25 mg at 11/15/15 2205  .  nitroGLYCERIN (NITROSTAT) SL tablet 0.4 mg  0.4 mg Sublingual Q5 Min x 3 PRN [name], MD      . ondansetron [facility]) injection 4 mg  4 mg Intravenous Q6H PRN [name], MD      . pantoprazole (PROTONIX) EC tablet 40 mg  40 mg Oral Daily [name], MD   40 mg at 11/15/15 0940  . sodium chloride flush (NS) 0.9 % injection 3 mL  3 mL Intravenous Q12H [name], MD   3 mL at 11/15/15 2205  . sodium chloride flush (NS) 0.9 % injection 3 mL  3 mL Intravenous PRN [name], MD      . tamsulosin [facility]) capsule 0.4 mg  0.4 mg Oral QPC supper [name], MD   0.4 mg at 11/15/15 1719     PE: General appearance: pleasant, alert, cooperative and no distress. Hard of hearing Lungs: clear to auscultation bilaterally, no wheezing or crackles Heart: regular rate and rhythm, S1, S2 normal, no murmur, click, rub or gallop Extremities: neg for pedal edema.  Groin cath sites are stable  Skin: warm and dry Neurologic: Grossly normal  Lab Results:   Recent Labs  11/15/15 0439  WBC 6.5  HGB 15.0  HCT 44.5  PLT 61*   BMET  Recent Labs  11/15/15 0439  NA 138  K  3.8  CL 104  CO2 22  GLUCOSE 113*  BUN 14  CREATININE 1.15  CALCIUM 8.9   PT/INR No results for input(s): LABPROT, INR in the last 72 hours. Cholesterol  Lipid Panel     Component Value Date/Time   CHOL 170 11/12/2015 0432   TRIG 207* 11/12/2015 0432   HDL 28* 11/12/2015 0432   CHOLHDL 6.1 11/12/2015 0432   VLDL 41* 11/12/2015 0432   LDLCALC 101* 11/12/2015 0432     Cardiac Panel (last 3 results) No results for input(s): CKTOTAL, CKMB, TROPONINI, RELINDX in the last 72 hours.  Studies/Results: Procedures    Left Heart Cath and Coronary Angiography    Conclusion     Sequential SVG .  The first limb of the sequential SVG-diagonal/ramus intermedius is patent, but the second limb is occluded.  Origin lesion, before 1st Diag, 40% stenosed.  Prox RCA to Mid RCA lesion, 100%  stenosed.  LIMA .  The LIMA is patent, but there is a tight lesion just beyond the insertion site in the mid-LAD  Mid LAD to Dist LAD lesion, 90% stenosed.  Ost LM lesion, 100% stenosed.  Ost Cx lesion, 100% stenosed.  There is severe left ventricular systolic dysfunction.  1. Severe native vessel CAD with total occlusion of the left main and RCA 2. S/P CABG with continued patency of the LIMA-LAD and first limb of the sequential SVG-diagonal 3. Total occlusion of the second limb of the SVG-ramus graft (culprit vessel) 4. Severe stenosis of the mid-LAD at the LIMA anastomotic site 5. Severe segmental LV systolic dysfunction  Tx CCU, post-MI medical therapy, 2D echo, start plavix 300 mg then 75 mg daily, consider PCI of the LIMA-LAD insertion. PCI will be high-risk in setting of recent infarct and severe LV dysfunction will need to consider hemodynamic support.   Left Heart    Left Ventricle There is severe left ventricular systolic dysfunction. The left ventricular ejection fraction is less than 25% by visual estimate. There is severe segmental LV dysfunction, with LVEF estimated 25%     Assessment/Plan  Active Problems:   Acute ST elevation myocardial infarction (STEMI) of posterior wall (HCC)   1. CAD/ STEMI: angiographic details outlined above. He has done well after his Inf. Lat MI ( from occlusion of the sequential SVG to ramus - not amenable to PCI ) and subsequent PCI of the mid LAD   2. HLD: LDL is 101 mg/dL. On Lipitor 40 mg  3. Severe LV Dysfunction: EF is 25% by cath. EF 40-45% on ECHO 5/11 - continue BB.  Will start Losartan 25 mg a day ,  Titrate up as OP as tolerated.      4. BPH- continue flomax 0.4mg  qhs    LOS: 5 days  DC to home Follow up with Dr. Domenic Polite in several weeks. Will need a BMP ( just started Losartan )    Mertie Moores, MD  11/16/2015 8:15 AM    [facility],  [place], [place]   [address] Pager [phone] Phone: [phone]; Fax: [phone]

## 2015-11-16 NOTE — Discharge Instructions (Signed)
Coronary Angiogram With Stent, Care After °Refer to this sheet in the next few weeks. These instructions provide you with information about caring for yourself after your procedure. Your health care provider may also give you more specific instructions. Your treatment has been planned according to current medical practices, but problems sometimes occur. Call your health care provider if you have any problems or questions after your procedure. °WHAT TO EXPECT AFTER THE PROCEDURE  °After your procedure, it is typical to have the following: °· Bruising at the catheter insertion site that usually fades within 1-2 weeks. °· Blood collecting in the tissue (hematoma) that may be painful to the touch. It should usually decrease in size and tenderness within 1-2 weeks. °HOME CARE INSTRUCTIONS °· Take medicines only as directed by your health care provider. Blood thinners may be prescribed after your procedure to improve blood flow through the stent. °· You may shower 24-48 hours after the procedure or as directed by your health care provider. Remove the bandage (dressing) and gently wash the catheter insertion site with plain soap and water. Pat the area dry with a clean towel. Do not rub the site, because this may cause bleeding. °· Do not take baths, swim, or use a hot tub until your health care provider approves. °· Check your catheter insertion site every day for redness, swelling, or drainage. °· Do not apply powder or lotion to the site. °· Do not lift over 10 lb (4.5 kg) for 5 days after your procedure or as directed by your health care provider. °· Ask your health care provider when it is okay to: °¨ Return to work or school. °¨ Resume usual physical activities or sports. °¨ Resume sexual activity. °· Eat a heart-healthy diet. This should include plenty of fresh fruits and vegetables. Meat should be lean cuts. Avoid the following types of food: °¨ Food that is high in salt. °¨ Canned or highly processed food. °¨ Food  that is high in saturated fat or sugar. °¨ Fried food. °· Make any other lifestyle changes as recommended by your health care provider. These may include: °¨ Not using any tobacco products, including cigarettes, chewing tobacco, or electronic cigarettes. If you need help quitting, ask your health care provider. °¨ Managing your weight. °¨ Getting regular exercise. °¨ Managing your blood pressure. °¨ Limiting your alcohol intake. °¨ Managing other health problems, such as diabetes. °· If you need an MRI after your heart stent has been placed, be sure to tell the health care provider who orders the MRI that you have a heart stent. °· Keep all follow-up visits as directed by your health care provider. This is important. °SEEK MEDICAL CARE IF: °· You have a fever. °· You have chills. °· You have increased bleeding from the catheter insertion site. Hold pressure on the site. °SEEK IMMEDIATE MEDICAL CARE IF: °· You develop chest pain or shortness of breath, feel faint, or pass out. °· You have unusual pain at the catheter insertion site. °· You have redness, warmth, or swelling at the catheter insertion site. °· You have drainage (other than a small amount of blood on the dressing) from the catheter insertion site. °· The catheter insertion site is bleeding, and the bleeding does not stop after 30 minutes of holding steady pressure on the site. °· You develop bleeding from any other place, such as from your rectum. There may be bright red blood in your urine or stool, or it may appear as black, tarry stool. °  °  This information is not intended to replace advice given to you by your health care provider. Make sure you discuss any questions you have with your health care provider. °  °Document Released: 01/08/2005 Document Revised: 07/12/2014 Document Reviewed: 11/13/2012 °Elsevier Interactive Patient Education ©2016 [employer] ° °

## 2015-11-16 NOTE — Discharge Summary (Signed)
Patient ID: John [name],  MRN: [id number], DOB/AGE: January 29, 1930 82 y.o.  Admit date: 11/11/2015 Discharge date: 11/16/2015  Primary Care Provider: Alonza Bogus, MD Primary Cardiologist: Dr Domenic Polite  Discharge Diagnoses Active Problems:   Acute ST elevation myocardial infarction (STEMI) of posterior wall [facility])    Procedures: Urgent cath 11/11/15                        PCI with DES to native LAD (with impella device)                                   11/14/15                        Echocardiogram 11/13/15  Hospital Course:   82 y.o.male from [place] with a history of CAD with prior CABG in 1992, HL, and HTN. He presented to [facility] 11/11/15 with SOB w/EKG findings of posterior MI. The ptient was transferred to [facility] for STEMI.  Urgent cath done 11/11/15 showed an occlusion of the SVG limb to RI, (the limb to the Dx was patent), the native RCA was 100% stenosed. This was un grafted and noted to be a chronic total occlusion with R-R bridging collaterals present. The LIMA-LAD was patent but there was a tight lesion in the native LAD just beyond the graft anastomosis. The LM was totalled. His EF at cath was 25%. Echo done 11/13/15 suggested his EF was closer to 40%. The pt was transferred to CCU and treated medically with plans to load with Plavix and consider high risk PCI with hemodynamic support. On 11/14/15 the pt underwent PCI to the LAD with a drug-eluting stent and hemodynamic support with an Impella CP device. The SVG-RI was occluded and not intervened on. He tolerated this well. The pt was seen on the morning of the 14th by Dr Acie Fredrickson and felt to be stable for discharge. He was taken off Amlodipine and placed on Losartan secondary to LVD. He will need a f/u BMP when he returns to the office in a week or so as a TOC follow up.   Discharge Vitals:  Blood pressure 113/72, pulse 88, temperature 97.6 F (36.4 C), temperature source Oral, resp. rate 23, height 5\' 6"  (1.676 m), weight 203 lb 0.7 oz  (92.1 kg), SpO2 94 %.    Labs: No results found for this or any previous visit (from the past 24 hour(s)).  Disposition:  Follow-up Information    Follow up with [name], MD.   Specialty:  Cardiology   Why:  office will contact you   Contact information:   [place] [address] [phone]       Discharge Medications:    Medication List    STOP taking these medications        amLODipine 5 MG tablet  Commonly known as:  NORVASC      TAKE these medications        acetaminophen 325 MG tablet  Commonly known as:  TYLENOL  Take 2 tablets (650 mg total) by mouth every 4 (four) hours as needed for headache or mild pain.     aspirin EC 81 MG tablet  Take 81 mg by mouth daily.     atorvastatin 40 MG tablet  Commonly known as:  LIPITOR  Take 1 tablet (40 mg total) by  mouth daily at 6 PM.     clopidogrel 75 MG tablet  Commonly known as:  PLAVIX  Take 1 tablet (75 mg total) by mouth daily with breakfast.     famotidine 10 MG tablet  Commonly known as:  PEPCID  Take 10 mg by mouth daily as needed for heartburn or indigestion.     losartan 25 MG tablet  Commonly known as:  COZAAR  Take 1 tablet (25 mg total) by mouth daily.     metoprolol succinate 25 MG 24 hr tablet  Commonly known as:  TOPROL-XL  Take 1 tablet (25 mg total) by mouth 2 (two) times daily.     nitroGLYCERIN 0.4 MG SL tablet  Commonly known as:  NITROSTAT  Place 1 tablet (0.4 mg total) under the tongue every 5 (five) minutes x 3 doses as needed for chest pain.     pantoprazole 40 MG tablet  Commonly known as:  PROTONIX  Take 40 mg by mouth daily.     tamsulosin 0.4 MG Caps capsule  Commonly known as:  FLOMAX  Take 0.4 mg by mouth daily.         Duration of Discharge Encounter: Greater than 30 minutes including physician time.  Angelena Form PA-C 11/16/2015 10:09 AM   Attending Note:   The patient was seen and examined.  Agree with assessment and plan as  noted above.  Changes made to the above note as needed.  Patient seen and independently examined with [name], PA .   We discussed all aspects of the encounter. I agree with the assessment and plan as stated above.  He has done well follow PCI of his LAD .  Stable after his inf. Lat MI ( due to occlusion of a sequential  SVG)    Follow up with Dr. Domenic Polite.   Thayer Headings, Brooke Bonito., MD, [facility] 11/21/2015, 10:20 AM [address],  [place] Pager [phone]

## 2015-11-16 NOTE — Progress Notes (Signed)
Discharged home by wheelchair accompanied by wife, discharged instructions given, belongings taken home.

## 2015-11-17 ENCOUNTER — Encounter (HOSPITAL_COMMUNITY): Payer: Self-pay | Admitting: Cardiovascular Disease

## 2015-11-17 ENCOUNTER — Telehealth: Payer: Self-pay | Admitting: Oncology

## 2015-11-17 NOTE — Telephone Encounter (Signed)
pt wife called to r/s appt due to pt had haeart attack....pt ok and aware of new d.t

## 2015-11-21 ENCOUNTER — Ambulatory Visit: Payer: Medicare Other | Admitting: Oncology

## 2015-11-21 ENCOUNTER — Other Ambulatory Visit: Payer: Medicare Other

## 2015-11-27 ENCOUNTER — Other Ambulatory Visit (HOSPITAL_COMMUNITY)
Admission: RE | Admit: 2015-11-27 | Discharge: 2015-11-27 | Disposition: A | Discharge status: Home / Self Care | Payer: Medicare Other | Source: Ambulatory Visit | Attending: Adult Health | Admitting: Adult Health

## 2015-11-27 ENCOUNTER — Ambulatory Visit (INDEPENDENT_AMBULATORY_CARE_PROVIDER_SITE_OTHER): Payer: Medicare Other | Admitting: Adult Health

## 2015-11-27 ENCOUNTER — Encounter: Payer: Self-pay | Admitting: Adult Health

## 2015-11-27 VITALS — BP 122/74 | HR 74 | Ht 66.0 in | Wt 200.0 lb

## 2015-11-27 DIAGNOSIS — I10 Essential (primary) hypertension: Secondary | ICD-10-CM | POA: Diagnosis not present

## 2015-11-27 DIAGNOSIS — I2510 Atherosclerotic heart disease of native coronary artery without angina pectoris: Secondary | ICD-10-CM | POA: Diagnosis not present

## 2015-11-27 LAB — BASIC METABOLIC PANEL
Anion gap: 7 (ref 5–15)
BUN: 22 mg/dL — ABNORMAL HIGH (ref 6–20)
CO2: 27 mmol/L (ref 22–32)
Calcium: 9.5 mg/dL (ref 8.9–10.3)
Chloride: 104 mmol/L (ref 101–111)
Creatinine, Ser: 1.34 mg/dL — ABNORMAL HIGH (ref 0.61–1.24)
GFR calc Af Amer: 55 mL/min — ABNORMAL LOW (ref >60)
GFR calc non Af Amer: 47 mL/min — ABNORMAL LOW (ref >60)
Glucose, Bld: 96 mg/dL (ref 65–99)
Potassium: 4.4 mmol/L (ref 3.5–5.1)
Sodium: 138 mmol/L (ref 135–145)

## 2015-11-27 NOTE — Progress Notes (Signed)
Cardiology Office Note   Date:  11/27/2015   ID:  John [name], DOB 1929-12-19, MRN [id number]  PCP:  Alonza Bogus, MD  Cardiologist:  [place]/ Jory Sims, NP   Chief Complaint  Patient presents with  . Coronary Artery Disease      History of Present Illness: John [name] is a 82 y.o. male who presents for ongoing assessment and management of coronary artery disease, status post CABG 1992, hyperlipidemia and hypertension.we're seeing the patient posthospital followup after admission for acute ST elevation MI on 11/11/2015.   Urgent cardiac catheterization was completed which showed an occlusion of the SVG limb to RI,the limb to the Dx was patent), the native RCA was 100% stenosed. This was un grafted and noted to be a chronic total occlusion with R-R bridging collaterals present. The LIMA-LAD was patent but there was a tight lesion in the native LAD just beyond the graft anastomosis. The LM was totalled. His EF at cath was 25%. Echo done 11/13/15 suggested his EF was closer to 40%.  On 11/14/15 the pt underwent PCI to the LAD with a drug-eluting stent and hemodynamic support with an Impella CP device. The SVG-RI was occluded and not intervened on. He tolerated this well. The pt was seen on the morning of the 11/16/2015 by Dr Acie Fredrickson and felt to be stable for discharge. He was taken off Amlodipine and placed on Losartan secondary to LVD. He will need a followup BMET.  He feels well today, his energy has returned. He is compliant with his medicines and not having any side effects.  Past Medical History  Diagnosis Date  . Malignant melanoma of skin   . Coronary atherosclerosis of native coronary artery     Multivessel status post CABG in 1992  . OSA (obstructive sleep apnea)   . Rosacea   . Mixed hyperlipidemia   . Nephrolithiasis   . Essential hypertension, benign   . Acute ST elevation myocardial infarction (STEMI) of posterior wall ([place]) 11/11/2015    Past Surgical  History  Procedure Laterality Date  . Total knee arthroplasty    . Hernia repair    . Coronary artery bypass graft  1992  . Melanoma excision      neck  . Cataract extraction w/phaco Right 02/11/2014    Procedure: CATARACT EXTRACTION PHACO AND INTRAOCULAR LENS PLACEMENT (IOC);  Surgeon: [name], MD;  Location: [facility];  Service: Ophthalmology;  Laterality: Right;  CDE 13.83  . Doppler echocardiography  05/23/2007    Left ventricular systolic function is normal. The transmitral spectral doppler flow pattern is suggestive of impaired LV relaxation. No significant valvular abnormalities.  . Nm myocar perf ejection fraction  02/10/2011    The post stress myocaardial perfusion images show a normal pattern of perfusion in all regions. No significant wall motion abnormalities noted. EKG is negative for ischemia. This is a low risk scan.  . Cardiac catheterization  07/11/90  . Carotid duplex  06/24/2011, 05/12/2009    Right and Left ICAs: Demonstrates a small amount of irregular mixed density plaque with no evidence of diameter reduction, significant tortuosity or any other vascular abnormality. This is a mildly abnormal carotid duplex doppler evaluation.  [name] Heart monitor  04/01/2009    palpitations  . Cholecystectomy    . Cardiac catheterization N/A 11/11/2015    Procedure: Left Heart Cath and Coronary Angiography;  Surgeon: [name], MD;  Location: [facility];  Service: Cardiovascular;  Laterality: N/A;  . Cardiac catheterization  N/A 11/14/2015    Procedure: Coronary Stent Intervention w/Impella;  Surgeon: [name], MD;  Location: [place] [facility];  Service: Cardiovascular;  Laterality: N/A;     Current Outpatient Prescriptions  Medication Sig Dispense Refill  . acetaminophen (TYLENOL) 325 MG tablet Take 2 tablets (650 mg total) by mouth every 4 (four) hours as needed for headache or mild pain.    [name] aspirin EC 81 MG tablet Take 81 mg by mouth daily.    [name] atorvastatin (LIPITOR)  40 MG tablet Take 1 tablet (40 mg total) by mouth daily at 6 PM. 30 tablet 11  . clopidogrel (PLAVIX) 75 MG tablet Take 1 tablet (75 mg total) by mouth daily with breakfast. 30 tablet 11  . famotidine (PEPCID) 10 MG tablet Take 10 mg by mouth daily as needed for heartburn or indigestion.    [name] losartan (COZAAR) 25 MG tablet Take 1 tablet (25 mg total) by mouth daily. 30 tablet 11  . metoprolol succinate (TOPROL-XL) 25 MG 24 hr tablet Take 1 tablet (25 mg total) by mouth 2 (two) times daily. 60 tablet 11  . nitroGLYCERIN (NITROSTAT) 0.4 MG SL tablet Place 1 tablet (0.4 mg total) under the tongue every 5 (five) minutes x 3 doses as needed for chest pain. 25 tablet 2  . pantoprazole (PROTONIX) 40 MG tablet Take 40 mg by mouth daily.    . tamsulosin (FLOMAX) 0.4 MG CAPS capsule Take 0.4 mg by mouth daily.      No current facility-administered medications for this visit.    Allergies:   Statins and Tape    Social History:  The patient  reports that he quit smoking about 34 years ago. His smoking use included Cigarettes. He started smoking about 72 years ago. He has never used smokeless tobacco. He reports that he does not drink alcohol or use illicit drugs.   Family History:  The patient's family history includes Heart attack in his brother and father; Skin cancer in his brother.    ROS: All other systems are reviewed and negative. Unless otherwise mentioned in H&P    PHYSICAL EXAM: VS:  BP 122/74 mmHg  Pulse 74  Ht 5\' 6"  (1.676 m)  Wt 200 lb (90.719 kg)  BMI 32.30 kg/m2  SpO2 98% , BMI Body mass index is 32.3 kg/(m^2). GEN: Well nourished, well developed, in no acute distress HEENT: normal Neck: no JVD, carotid bruits, or masses Cardiac: RRR; no murmurs, rubs, or gallops,no edema  Respiratory:  clear to auscultation bilaterally, normal work of breathing GI: soft, nontender, nondistended, + BS MS: no deformity or atrophyleft arm catheterization insertion site has some ecchymosis and  some soreness. No bleeding or evidence of infection Skin: warm and dry, no rash Neuro:  Strength and sensation are intact. Very hard of hearing Psych: euthymic mood, full affect   Recent Labs: 11/11/2015: B Natriuretic Peptide 747.1*; TSH 2.104 11/15/2015: BUN 14; Creatinine, Ser 1.15; Hemoglobin 15.0; Platelets 61*; Potassium 3.8; Sodium 138    Lipid Panel    Component Value Date/Time   CHOL 170 11/12/2015 0432   TRIG 207* 11/12/2015 0432   HDL 28* 11/12/2015 0432   CHOLHDL 6.1 11/12/2015 0432   VLDL 41* 11/12/2015 0432   LDLCALC 101* 11/12/2015 0432      Wt Readings from Last 3 Encounters:  11/27/15 200 lb (90.719 kg)  11/16/15 203 lb 0.7 oz (92.1 kg)  10/02/15 207 lb (93.895 kg)      ASSESSMENT AND PLAN:  1.Coronary artery  disease:status post ST elevation MI while seeing primary care physician, Dr. Luan Pulling, in the office for shortness of breath. I've explained to him that this is his anginal equivalent. He had intervention to his LAD.  He is feeling well he has not had any further complaints. He will continue on dual antiplatelet therapy,statin therapy. He will have followup BMET completed. He has been given permission to drive and ride his riding lawnmower.  2. Hypertension:amlodipine was discontinued and he was started on losartan 25 mg daily.creatinine on discharge 1.2. Followup BMET is being requested. Blood pressure is stable.   Current medicines are reviewed at length with the patient today.    Labs/ tests ordered today include: BMET  Orders Placed This Encounter  Procedures  . Basic Metabolic Panel (BMET)     Disposition:   FU with Dr. Domenic Polite in 3 months.   Signed, Jory Sims, NP  11/27/2015 3:25 PM    [facility] [address] [place], [place] [address] Phone: [phone]; Fax: [phone]

## 2015-11-27 NOTE — Progress Notes (Deleted)
Name: John [name]    DOB: [birth year]  Age: 82 y.o.  MR#: [id number]       PCP:  Alonza Bogus, MD      Insurance: Payor: [employer] MEDICARE / Plan: [facility] MEDICARE / Product Type: *No Product type* /   CC:   No chief complaint on file.   VS Filed Vitals:   11/27/15 1451  BP: 122/74  Pulse: 74  Height: 5\' 6"  (1.676 m)  Weight: 200 lb (90.719 kg)  SpO2: 98%    Weights Current Weight  11/27/15 200 lb (90.719 kg)  11/16/15 203 lb 0.7 oz (92.1 kg)  10/02/15 207 lb (93.895 kg)    Blood Pressure  BP Readings from Last 3 Encounters:  11/27/15 122/74  11/16/15 115/73  10/02/15 118/70     Admit date:  (Not on file) Last encounter with RMR:  Visit date not found   Allergy Statins and Tape  Current Outpatient Prescriptions  Medication Sig Dispense Refill  . acetaminophen (TYLENOL) 325 MG tablet Take 2 tablets (650 mg total) by mouth every 4 (four) hours as needed for headache or mild pain.    [name] aspirin EC 81 MG tablet Take 81 mg by mouth daily.    [name] atorvastatin (LIPITOR) 40 MG tablet Take 1 tablet (40 mg total) by mouth daily at 6 PM. 30 tablet 11  . clopidogrel (PLAVIX) 75 MG tablet Take 1 tablet (75 mg total) by mouth daily with breakfast. 30 tablet 11  . famotidine (PEPCID) 10 MG tablet Take 10 mg by mouth daily as needed for heartburn or indigestion.    [name] losartan (COZAAR) 25 MG tablet Take 1 tablet (25 mg total) by mouth daily. 30 tablet 11  . metoprolol succinate (TOPROL-XL) 25 MG 24 hr tablet Take 1 tablet (25 mg total) by mouth 2 (two) times daily. 60 tablet 11  . nitroGLYCERIN (NITROSTAT) 0.4 MG SL tablet Place 1 tablet (0.4 mg total) under the tongue every 5 (five) minutes x 3 doses as needed for chest pain. 25 tablet 2  . pantoprazole (PROTONIX) 40 MG tablet Take 40 mg by mouth daily.    . tamsulosin (FLOMAX) 0.4 MG CAPS capsule Take 0.4 mg by mouth daily.      No current facility-administered medications for this visit.    Discontinued Meds:   There are  no discontinued medications.  Patient Active Problem List   Diagnosis Date Noted  . Acute ST elevation myocardial infarction (STEMI) of posterior wall ([place]) 11/11/2015  . Coronary atherosclerosis of native coronary artery 09/17/2013  . Essential hypertension, benign 09/17/2013  . Mixed hyperlipidemia 09/17/2013    LABS    Component Value Date/Time   NA 138 11/15/2015 0439   NA 142 11/12/2015 0432   NA 139 11/11/2015 1401   NA 144 11/22/2014 0933   NA 142 11/23/2013 0936   NA 141 10/17/2012 0754   K 3.8 11/15/2015 0439   K 3.5 11/12/2015 0432   K 3.9 11/11/2015 1401   K 4.0 11/22/2014 0933   K 4.2 11/23/2013 0936   K 3.8 10/17/2012 0754   CL 104 11/15/2015 0439   CL 103 11/12/2015 0432   CL 105 11/11/2015 1401   CL 106 10/17/2012 0754   CO2 22 11/15/2015 0439   CO2 25 11/12/2015 0432   CO2 22 11/11/2015 1401   CO2 27 11/22/2014 0933   CO2 25 11/23/2013 0936   CO2 24 10/17/2012 0754   GLUCOSE 113* 11/15/2015 0439   GLUCOSE  102* 11/12/2015 0432   GLUCOSE 121* 11/11/2015 1401   GLUCOSE 97 11/22/2014 0933   GLUCOSE 103 11/23/2013 0936   GLUCOSE 92 10/17/2012 0754   BUN 14 11/15/2015 0439   BUN 14 11/12/2015 0432   BUN 19 11/11/2015 1401   BUN 14.0 11/22/2014 0933   BUN 14.5 11/23/2013 0936   BUN 15.0 10/17/2012 0754   CREATININE 1.15 11/15/2015 0439   CREATININE 1.15 11/12/2015 0432   CREATININE 1.29* 11/11/2015 1803   CREATININE 1.1 11/22/2014 0933   CREATININE 1.2 11/23/2013 0936   CREATININE 1.2 10/17/2012 0754   CALCIUM 8.9 11/15/2015 0439   CALCIUM 9.0 11/12/2015 0432   CALCIUM 9.2 11/11/2015 1401   CALCIUM 9.4 11/22/2014 0933   CALCIUM 9.4 11/23/2013 0936   CALCIUM 9.3 10/17/2012 0754   GFRNONAA 57* 11/15/2015 0439   GFRNONAA 57* 11/12/2015 0432   GFRNONAA 50* 11/11/2015 1803   GFRAA >60 11/15/2015 0439   GFRAA >60 11/12/2015 0432   GFRAA 57* 11/11/2015 1803   CMP     Component Value Date/Time   NA 138 11/15/2015 0439   NA 144 11/22/2014 0933    K 3.8 11/15/2015 0439   K 4.0 11/22/2014 0933   CL 104 11/15/2015 0439   CL 106 10/17/2012 0754   CO2 22 11/15/2015 0439   CO2 27 11/22/2014 0933   GLUCOSE 113* 11/15/2015 0439   GLUCOSE 97 11/22/2014 0933   GLUCOSE 92 10/17/2012 0754   BUN 14 11/15/2015 0439   BUN 14.0 11/22/2014 0933   CREATININE 1.15 11/15/2015 0439   CREATININE 1.1 11/22/2014 0933   CALCIUM 8.9 11/15/2015 0439   CALCIUM 9.4 11/22/2014 0933   PROT 6.9 11/22/2014 0933   PROT 6.5 03/18/2014 0908   ALBUMIN 4.1 11/22/2014 0933   ALBUMIN 4.2 03/18/2014 0908   AST 33 11/22/2014 0933   AST 30 03/18/2014 0908   ALT 40 11/22/2014 0933   ALT 36 03/18/2014 0908   ALKPHOS 57 11/22/2014 0933   ALKPHOS 44 03/18/2014 0908   BILITOT 2.59* 11/22/2014 0933   BILITOT 2.4* 03/18/2014 0908   GFRNONAA 57* 11/15/2015 0439   GFRAA >60 11/15/2015 0439       Component Value Date/Time   WBC 6.5 11/15/2015 0439   WBC 6.9 11/12/2015 0432   WBC 8.5 11/11/2015 1803   WBC 5.0 11/22/2014 0933   WBC 5.5 11/23/2013 0936   WBC 5.3 10/17/2012 0754   HGB 15.0 11/15/2015 0439   HGB 15.8 11/12/2015 0432   HGB 17.1* 11/11/2015 1803   HGB 16.4 11/22/2014 0933   HGB 16.1 11/23/2013 0936   HGB 15.3 10/17/2012 0754   HCT 44.5 11/15/2015 0439   HCT 45.9 11/12/2015 0432   HCT 50.0 11/11/2015 1803   HCT 46.1 11/22/2014 0933   HCT 47.5 11/23/2013 0936   HCT 44.3 10/17/2012 0754   MCV 91.0 11/15/2015 0439   MCV 91.8 11/12/2015 0432   MCV 90.4 11/11/2015 1803   MCV 89.0 11/22/2014 0933   MCV 89.7 11/23/2013 0936   MCV 89.5 10/17/2012 0754    Lipid Panel     Component Value Date/Time   CHOL 170 11/12/2015 0432   TRIG 207* 11/12/2015 0432   HDL 28* 11/12/2015 0432   CHOLHDL 6.1 11/12/2015 0432   VLDL 41* 11/12/2015 0432   LDLCALC 101* 11/12/2015 0432    ABG No results found for: PHART, PCO2ART, PO2ART, HCO3, TCO2, ACIDBASEDEF, O2SAT   Lab Results  Component Value Date   TSH 2.104 11/11/2015   BNP (  last 3 results)  Recent  Labs  11/11/15 1803  BNP 747.1*    ProBNP (last 3 results) No results for input(s): PROBNP in the last 8760 hours.  Cardiac Panel (last 3 results) No results for input(s): CKTOTAL, CKMB, TROPONINI, RELINDX in the last 72 hours.  Iron/TIBC/Ferritin/ %Sat No results found for: IRON, TIBC, FERRITIN, IRONPCTSAT   EKG Orders placed or performed during the hospital encounter of 11/11/15  . ED EKG  . ED EKG  . EKG 12-Lead  . EKG 12-Lead  . EKG 12-Lead  . EKG 12-Lead  . ED EKG  . ED EKG  . EKG 12-Lead  . EKG 12-Lead  . EKG 12-Lead  . EKG 12-Lead  . EKG 12-Lead  . EKG 12-Lead  . EKG 12-Lead  . EKG 12-Lead immediately post procedure  . EKG 12-Lead  . EKG 12-Lead  . EKG 12-Lead  . EKG 12-Lead immediately post procedure  . EKG 12-Lead  . EKG     Prior Assessment and Plan Problem List as of 11/27/2015      Cardiovascular and Mediastinum   Coronary atherosclerosis of native coronary artery   Last Assessment & Plan 03/18/2014 Office Visit Written 03/18/2014 10:03 AM by [name], MD    Symptomatically stable on current medical regimen. Continue observation, no changes made today.      Essential hypertension, benign   Last Assessment & Plan 03/18/2014 Office Visit Written 03/18/2014 10:03 AM by [name], MD    Blood pressure reasonably well controlled.      Acute ST elevation myocardial infarction (STEMI) of posterior wall [facility])     Other   Mixed hyperlipidemia   Last Assessment & Plan 03/18/2014 Office Visit Written 03/18/2014 10:03 AM by [name], MD    History of statin intolerance. Check FLP and LFT for new baseline, last LDL was 118.          Imaging: Dg Chest Portable 1 View  11/11/2015  CLINICAL DATA:  Shortness of breath for 24 hours EXAM: PORTABLE CHEST 1 VIEW COMPARISON:  12/14/2010 FINDINGS: Limited low volume chest. There is cardiomegaly and vascular pedicle widening. Chronic aortic tortuosity. Interstitial coarsening is likely from low  volumes and technique. There is no edema, consolidation, effusion, or pneumothorax. IMPRESSION: Limited low volume chest without visible pneumonia or edema. Electronically Signed   By: Monte Fantasia M.D.   On: 11/11/2015 14:09

## 2015-11-27 NOTE — Patient Instructions (Signed)
Your physician recommends that you schedule a follow-up appointment in: 3 Months with Dr. Domenic Polite  Your physician recommends that you have lab work done today.  Your physician recommends that you continue on your current medications as directed. Please refer to the Current Medication list given to you today.  If you need a refill on your cardiac medications before your next appointment, please call your pharmacy.  Thank you for choosing [facility]! '

## 2015-11-28 ENCOUNTER — Telehealth: Payer: Self-pay | Admitting: *Deleted

## 2015-11-28 NOTE — Telephone Encounter (Signed)
Called patient with test results. No answer. Left message to call back.  

## 2015-11-28 NOTE — Telephone Encounter (Signed)
-----   Message from Lendon Colonel, NP sent at 11/27/2015  4:57 PM EDT ----- Creatinine  And GFR worsened since placing on losartan. Discontinue this. Return him to amlodipine 5 mg daily.

## 2015-12-02 ENCOUNTER — Telehealth: Payer: Self-pay | Admitting: *Deleted

## 2015-12-02 MED ORDER — AMLODIPINE BESYLATE 5 MG PO TABS: 5.0000 mg | ORAL_TABLET | Freq: Every day | ORAL | Status: DC

## 2015-12-02 NOTE — Telephone Encounter (Signed)
-----   Message from Lendon Colonel, NP sent at 11/27/2015  4:57 PM EDT ----- Creatinine  And GFR worsened since placing on losartan. Discontinue this. Return him to amlodipine 5 mg daily.

## 2015-12-18 ENCOUNTER — Telehealth: Payer: Self-pay | Admitting: Oncology

## 2015-12-18 ENCOUNTER — Ambulatory Visit (HOSPITAL_BASED_OUTPATIENT_CLINIC_OR_DEPARTMENT_OTHER): Payer: Medicare Other | Admitting: Oncology

## 2015-12-18 ENCOUNTER — Other Ambulatory Visit (HOSPITAL_BASED_OUTPATIENT_CLINIC_OR_DEPARTMENT_OTHER): Payer: Medicare Other

## 2015-12-18 VITALS — BP 131/67 | HR 60 | Temp 97.7°F | Resp 17 | Ht 66.0 in | Wt 201.0 lb

## 2015-12-18 DIAGNOSIS — D696 Thrombocytopenia, unspecified: Secondary | ICD-10-CM

## 2015-12-18 DIAGNOSIS — C439 Malignant melanoma of skin, unspecified: Secondary | ICD-10-CM

## 2015-12-18 DIAGNOSIS — Z85820 Personal history of malignant melanoma of skin: Secondary | ICD-10-CM | POA: Diagnosis not present

## 2015-12-18 LAB — CBC WITH DIFFERENTIAL/PLATELET
BASO%: 0.2 % (ref 0.0–2.0)
Basophils Absolute: 0 10*3/uL (ref 0.0–0.1)
EOS%: 4.8 % (ref 0.0–7.0)
Eosinophils Absolute: 0.3 10*3/uL (ref 0.0–0.5)
HCT: 43.8 % (ref 38.4–49.9)
HGB: 15 g/dL (ref 13.0–17.1)
LYMPH%: 37.8 % (ref 14.0–49.0)
MCH: 31.4 pg (ref 27.2–33.4)
MCHC: 34.2 g/dL (ref 32.0–36.0)
MCV: 91.8 fL (ref 79.3–98.0)
MONO#: 0.3 10*3/uL (ref 0.1–0.9)
MONO%: 6.3 % (ref 0.0–14.0)
NEUT#: 2.7 10*3/uL (ref 1.5–6.5)
NEUT%: 50.9 % (ref 39.0–75.0)
Platelets: 71 10*3/uL — ABNORMAL LOW (ref 140–400)
RBC: 4.77 10*6/uL (ref 4.20–5.82)
RDW: 13.6 % (ref 11.0–14.6)
WBC: 5.2 10*3/uL (ref 4.0–10.3)
lymph#: 2 10*3/uL (ref 0.9–3.3)

## 2015-12-18 LAB — COMPREHENSIVE METABOLIC PANEL
ALT: 33 U/L (ref 0–55)
AST: 31 U/L (ref 5–34)
Albumin: 3.8 g/dL (ref 3.5–5.0)
Alkaline Phosphatase: 43 U/L (ref 40–150)
Anion Gap: 7 mEq/L (ref 3–11)
BUN: 18.4 mg/dL (ref 7.0–26.0)
CO2: 24 mEq/L (ref 22–29)
Calcium: 9.3 mg/dL (ref 8.4–10.4)
Chloride: 108 mEq/L (ref 98–109)
Creatinine: 1.2 mg/dL (ref 0.7–1.3)
EGFR: 58 mL/min/{1.73_m2} — ABNORMAL LOW (ref >90)
Glucose: 96 mg/dl (ref 70–140)
Potassium: 4 mEq/L (ref 3.5–5.1)
Sodium: 139 mEq/L (ref 136–145)
Total Bilirubin: 2.9 mg/dL — ABNORMAL HIGH (ref 0.20–1.20)
Total Protein: 6.6 g/dL (ref 6.4–8.3)

## 2015-12-18 LAB — LACTATE DEHYDROGENASE: LDH: 175 U/L (ref 125–245)

## 2015-12-18 NOTE — Telephone Encounter (Signed)
Gave and printed appt sched and avs for pt for June 2018 °

## 2015-12-18 NOTE — Progress Notes (Signed)
Hematology and Oncology Follow Up Visit  John [name] [id number] 1930-01-26 82 y.o. 12/18/2015 1:01 PM    CC: John [name] L. John [name], M.D. John H. Constance Holster, MD John [name]. John [name], M.D.    Principle Diagnosis: This is an 81 year old gentleman with;  1. 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  2. 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  Prior Therapy: He is S/P wide excision and lymph node dissection.  Pathology revealed a Clark level III, 1.25-mm melanoma without ulceration or vascular invasion.  He had 0/21 lymph nodes involved.  Did not receive any adjuvant therapy.  Current therapy: Observation and surveillance.  Interim History:   John [name] presents today for a followup visit with his wife.  Since his last visit, he sustained a myocardial infarction with ST elevation and infarction of the posterior wall in May 2017. He required urgent PCI and stent placement. He recovered fairly well and does not report any residual effects. He denies any chest pain or shortness of breath. He denied any bleeding complications after the procedure.  He denies any skin rashes or lesions. He denied any lymphadenopathy or masses. He continues to be reasonably active and performs activities of daily living. He has slowed down after his myocardial infarction but seems to be improving.  He has not reported any headaches or blurry vision or double vision. Has not reported any syncope. Did not report any chest pain or shortness of breath. Does not report any abdominal pain or early satiety. Did not report any leg edema or arthralgias. Rest of his review of system is unremarkable.  Medication reviewed without any changes. Current Outpatient Prescriptions  Medication Sig Dispense Refill  . acetaminophen (TYLENOL) 325 MG tablet Take 2 tablets (650 mg total) by mouth every 4 (four) hours as needed for headache or mild pain.    [name] amLODipine (NORVASC) 5 MG tablet Take 1  tablet (5 mg total) by mouth daily. 180 tablet 3  . aspirin EC 81 MG tablet Take 81 mg by mouth daily.    [name] atorvastatin (LIPITOR) 40 MG tablet Take 1 tablet (40 mg total) by mouth daily at 6 PM. 30 tablet 11  . clopidogrel (PLAVIX) 75 MG tablet Take 1 tablet (75 mg total) by mouth daily with breakfast. 30 tablet 11  . famotidine (PEPCID) 10 MG tablet Take 10 mg by mouth daily as needed for heartburn or indigestion.    . metoprolol succinate (TOPROL-XL) 25 MG 24 hr tablet Take 1 tablet (25 mg total) by mouth 2 (two) times daily. 60 tablet 11  . nitroGLYCERIN (NITROSTAT) 0.4 MG SL tablet Place 1 tablet (0.4 mg total) under the tongue every 5 (five) minutes x 3 doses as needed for chest pain. 25 tablet 2  . pantoprazole (PROTONIX) 40 MG tablet Take 40 mg by mouth daily.    . tamsulosin (FLOMAX) 0.4 MG CAPS capsule Take 0.4 mg by mouth daily.      No current facility-administered medications for this visit.     Allergies:  Allergies  Allergen Reactions  . Statins Other (See Comments)    Weak,nervous  . Tape Rash     Physical Exam: Blood pressure 131/67, pulse 60, temperature 97.7 F (36.5 C), temperature source Oral, resp. rate 17, height 5\' 6"  (1.676 m), weight 201 lb (91.173 kg), SpO2 98 %. ECOG: 1 General appearance: Pleasant-appearing gentleman without distress. Head: Normocephalic, without obvious abnormality Neck: no adenopathy, thyroid appeared normal the Lymph nodes: Cervical, supraclavicular, and  axillary nodes normal. Heart:regular rate and rhythm, S1, S2.  Lung:chest clear, no wheezing, rales, normal symmetric air entry Abdomin: soft, non-tender, without masses or organomegaly no shifting dullness or ascites. EXT:no erythema, induration, or nodules Skin: No masses or lesions noted. No petechiae noted.   Lab Results: Lab Results  Component Value Date   WBC 5.2 12/18/2015   HGB 15.0 12/18/2015   HCT 43.8 12/18/2015   MCV 91.8 12/18/2015   PLT 71* 12/18/2015      Chemistry      Component Value Date/Time   NA 138 11/27/2015 1608   NA 144 11/22/2014 0933   K 4.4 11/27/2015 1608   K 4.0 11/22/2014 0933   CL 104 11/27/2015 1608   CL 106 10/17/2012 0754   CO2 27 11/27/2015 1608   CO2 27 11/22/2014 0933   BUN 22* 11/27/2015 1608   BUN 14.0 11/22/2014 0933   CREATININE 1.34* 11/27/2015 1608   CREATININE 1.1 11/22/2014 0933      Component Value Date/Time   CALCIUM 9.5 11/27/2015 1608   CALCIUM 9.4 11/22/2014 0933   ALKPHOS 57 11/22/2014 0933   ALKPHOS 44 03/18/2014 0908   AST 33 11/22/2014 0933   AST 30 03/18/2014 0908   ALT 40 11/22/2014 0933   ALT 36 03/18/2014 0908   BILITOT 2.59* 11/22/2014 0933   BILITOT 2.4* 03/18/2014 0908        Impression and Plan:   82 year old gentleman with the following issues: 1. Stage IB melanoma diagnosed in August 2007, status post wide excision and a neck dissection.  He continues to have no evidence of any recurrent disease. He is currently under active surveillance from dermatology. No need for any changes or imaging studies at this time. 2. Thrombocytopenia.  Rather mild and stable without any evidence of bleeding.  Related to autoimmune etiology versus reactive findings. The plan is to continue with active surveillance. No need for any growth factor support or steroid trials at this time. 3. Followup will be in 12 months.    [facility], MD 6/15/20171:01 PM

## 2015-12-26 DIAGNOSIS — I25119 Atherosclerotic heart disease of native coronary artery with unspecified angina pectoris: Secondary | ICD-10-CM | POA: Diagnosis not present

## 2015-12-26 DIAGNOSIS — E785 Hyperlipidemia, unspecified: Secondary | ICD-10-CM | POA: Diagnosis not present

## 2015-12-26 DIAGNOSIS — N401 Enlarged prostate with lower urinary tract symptoms: Secondary | ICD-10-CM | POA: Diagnosis not present

## 2015-12-26 DIAGNOSIS — I10 Essential (primary) hypertension: Secondary | ICD-10-CM | POA: Diagnosis not present

## 2016-02-20 DIAGNOSIS — D696 Thrombocytopenia, unspecified: Secondary | ICD-10-CM | POA: Diagnosis not present

## 2016-02-20 DIAGNOSIS — I10 Essential (primary) hypertension: Secondary | ICD-10-CM | POA: Diagnosis not present

## 2016-02-20 DIAGNOSIS — N410 Acute prostatitis: Secondary | ICD-10-CM | POA: Diagnosis not present

## 2016-02-20 DIAGNOSIS — I25119 Atherosclerotic heart disease of native coronary artery with unspecified angina pectoris: Secondary | ICD-10-CM | POA: Diagnosis not present

## 2016-03-02 NOTE — Progress Notes (Signed)
Cardiology Office Note  Date: 03/03/2016   ID: John [name], DOB [birth year], MRN [id number]  PCP: Alonza Bogus, MD  Primary Cardiologist: [name], MD   Chief Complaint  Patient presents with  . Coronary Artery Disease  . Cardiomyopathy    History of Present Illness: John [name] is an 82 y.o. male that I last saw in March. I reviewed interval records. He was admitted to the hospital in May with an acute posterior infarct. Cardiac catheterization report is outlined below. Culprit lesion was related to total occlusion of the second limb of the SVG-ramus graft. Patient ultimately underwent placement of DES to the native LAD, treating a tight stenosis just beyond the insertion of the LIMA to LAD. LVEF was 40-45% by echocardiography. He was otherwise managed medically and recuperated. He did have a follow-up visit with Ms. Lawrence NP in May.  He presents for a follow-up visit today. He does not report any angina symptoms, but has been having intermittent trouble with leg edema and mild orthopnea. He is no longer on diuretic therapy. In reviewing records I see that he was taken off of ARB with creatinine of 1.1-1.3, continued on Norvasc.  He has had no palpitations or syncope. Continues to enjoy being outdoors. Feels like he is nearly back to baseline.  Past Medical History:  Diagnosis Date  . Acute ST elevation myocardial infarction (STEMI) of posterior wall ([place]) 11/11/2015  . Coronary atherosclerosis of native coronary artery    Multivessel status post CABG in 1992  . Essential hypertension, benign   . Malignant melanoma of skin   . Mixed hyperlipidemia   . Nephrolithiasis   . OSA (obstructive sleep apnea)   . Rosacea     Past Surgical History:  Procedure Laterality Date  . CARDIAC CATHETERIZATION  07/11/90  . CARDIAC CATHETERIZATION N/A 11/11/2015   Procedure: Left Heart Cath and Coronary Angiography;  Surgeon: [name], MD;  Location: [facility];   Service: Cardiovascular;  Laterality: N/A;  . CARDIAC CATHETERIZATION N/A 11/14/2015   Procedure: Coronary Stent Intervention w/Impella;  Surgeon: [name], MD;  Location: [facility];  Service: Cardiovascular;  Laterality: N/A;  . carotid duplex  06/24/2011, 05/12/2009   Right and Left ICAs: Demonstrates a small amount of irregular mixed density plaque with no evidence of diameter reduction, significant tortuosity or any other vascular abnormality. This is a mildly abnormal carotid duplex doppler evaluation.  [name] CATARACT EXTRACTION W/PHACO Right 02/11/2014   Procedure: CATARACT EXTRACTION PHACO AND INTRAOCULAR LENS PLACEMENT (IOC);  Surgeon: [name], MD;  Location: [facility];  Service: Ophthalmology;  Laterality: Right;  CDE 13.83  . CHOLECYSTECTOMY    . CORONARY ARTERY BYPASS GRAFT  1992  . DOPPLER ECHOCARDIOGRAPHY  05/23/2007   Left ventricular systolic function is normal. The transmitral spectral doppler flow pattern is suggestive of impaired LV relaxation. No significant valvular abnormalities.  . heart monitor  04/01/2009   palpitations  . HERNIA REPAIR    . MELANOMA EXCISION     neck  . NM MYOCAR PERF EJECTION FRACTION  02/10/2011   The post stress myocaardial perfusion images show a normal pattern of perfusion in all regions. No significant wall motion abnormalities noted. EKG is negative for ischemia. This is a low risk scan.  [name] TOTAL KNEE ARTHROPLASTY      Current Outpatient Prescriptions  Medication Sig Dispense Refill  . acetaminophen (TYLENOL) 325 MG tablet Take 2 tablets (650 mg total) by mouth every 4 (four)  hours as needed for headache or mild pain.    [name] amLODipine (NORVASC) 5 MG tablet Take 1 tablet (5 mg total) by mouth daily. 180 tablet 3  . aspirin EC 81 MG tablet Take 81 mg by mouth daily.    . clopidogrel (PLAVIX) 75 MG tablet Take 1 tablet (75 mg total) by mouth daily with breakfast. 30 tablet 11  . famotidine (PEPCID) 10 MG tablet Take 10 mg by mouth daily as  needed for heartburn or indigestion.    . metoprolol succinate (TOPROL-XL) 25 MG 24 hr tablet Take 1 tablet (25 mg total) by mouth 2 (two) times daily. 60 tablet 11  . nitroGLYCERIN (NITROSTAT) 0.4 MG SL tablet Place 1 tablet (0.4 mg total) under the tongue every 5 (five) minutes x 3 doses as needed for chest pain. 25 tablet 2  . pantoprazole (PROTONIX) 40 MG tablet Take 40 mg by mouth daily.    . tamsulosin (FLOMAX) 0.4 MG CAPS capsule Take 0.4 mg by mouth daily.     . furosemide (LASIX) 20 MG tablet Take 1 tablet (20 mg total) by mouth every other day. 15 tablet 6   No current facility-administered medications for this visit.    Allergies:  Statins and Tape   Social History: The patient  reports that he quit smoking about 34 years ago. His smoking use included Cigarettes. He started smoking about 72 years ago. He has never used smokeless tobacco. He reports that he does not drink alcohol or use drugs.   ROS:  Please see the history of present illness. Otherwise, complete review of systems is positive for decreased hearing.  All other systems are reviewed and negative.   Physical Exam: VS:  BP 140/84   Pulse 68   Ht 5\' 9"  (1.753 m)   Wt 202 lb (91.6 kg)   SpO2 97%   BMI 29.83 kg/m , BMI Body mass index is 29.83 kg/m.  Wt Readings from Last 3 Encounters:  03/03/16 202 lb (91.6 kg)  12/18/15 201 lb (91.2 kg)  11/27/15 200 lb (90.7 kg)    General: Elderly male, appears comfortable at rest. HEENT: Conjunctiva and lids normal, oropharynx clear. Neck: Supple, no elevated JVP or carotid bruits, no thyromegaly. Lungs: Clear to auscultation, nonlabored breathing at rest. Cardiac: Regular rate and rhythm with ectopy, no S3, soft systollic murmur. Abdomen: Soft, nontender, bowel sounds present, no guarding or rebound. Extremities: 1+ lower leg and ankle edema, distal pulses 2+. Skin: Warm and dry. Musculoskeletal: No kyphosis. Neuropsychiatric: Alert and oriented 3, affect appropriate.  Diminished hearing.  ECG: I personally reviewed the tracing from 11/15/2015 which showed sinus rhythm with prolonged PR interval, IVCD with anterolateral ST segment abnormalities consistent with ischemia or posterior injury.  Recent Labwork: 11/11/2015: B Natriuretic Peptide 747.1; TSH 2.104 12/18/2015: ALT 33; AST 31; BUN 18.4; Creatinine 1.2; HGB 15.0; Platelets 71; Potassium 4.0; Sodium 139     Component Value Date/Time   CHOL 170 11/12/2015 0432   TRIG 207 (H) 11/12/2015 0432   HDL 28 (L) 11/12/2015 0432   CHOLHDL 6.1 11/12/2015 0432   VLDL 41 (H) 11/12/2015 0432   LDLCALC 101 (H) 11/12/2015 0432    Other Studies Reviewed Today:  Echocardiogram 11/13/2015: Study Conclusions  - Left ventricle: The cavity size was normal. There was moderate   focal basal hypertrophy of the septum. Systolic function was   mildly to moderately reduced. The estimated ejection fraction was   in the range of 40% to 45%. There is  akinesis of the   inferolateral myocardium. There is hypokinesis of the inferior   myocardium. Features are consistent with a pseudonormal left   ventricular filling pattern, with concomitant abnormal relaxation   and increased filling pressure (grade 2 diastolic dysfunction).   Doppler parameters are consistent with high ventricular filling   pressure. - Aortic valve: There was mild regurgitation. - Aortic root: The aortic root was mildly dilated. - Left atrium: The atrium was moderately dilated.  Impressions:  - Akinesis of the inferolateral wall and hypokinesis of the   inferior wall; overall mild to moderate LV dysfunction; grade 2   diastolic dysfunction with elevated LV filling pressure; mild AI;   moderate LAE; mildly dilated aortic root (43 mm); suggest CTA or   MRA to further assess.  Cardiac catheterization 11/11/2015:  Sequential SVG .  The first limb of the sequential SVG-diagonal/ramus intermedius is patent, but the second limb is occluded.  Origin  lesion, before 1st Diag, 40% stenosed.  Prox RCA to Mid RCA lesion, 100% stenosed.  LIMA .  The LIMA is patent, but there is a tight lesion just beyond the insertion site in the mid-LAD  Mid LAD to Dist LAD lesion, 90% stenosed.  Ost LM lesion, 100% stenosed.  Ost Cx lesion, 100% stenosed.  There is severe left ventricular systolic dysfunction.   1. Severe native vessel CAD with total occlusion of the left main and RCA 2. S/P CABG with continued patency of the LIMA-LAD and first limb of the sequential SVG-diagonal 3. Total occlusion of the second limb of the SVG-ramus graft (culprit vessel) 4. Severe stenosis of the mid-LAD at the LIMA anastomotic site 5. Severe segmental LV systolic dysfunction  Coronary PCI 11/14/2015:  Sequential SVG .  The first limb of the sequential SVG-diagonal/ramus intermedius is patent, but the second limb is occluded.  Origin lesion, before 1st Diag, 40% stenosed.  Prox RCA to Mid RCA lesion, 100% stenosed.  LIMA .  The LIMA is patent, but there is a tight lesion just beyond the insertion site in the mid-LAD  Ost LM lesion, 100% stenosed.  Ost Cx lesion, 100% stenosed.  Mid LAD to Dist LAD lesion, 90% stenosed. Post intervention, there is a 0% residual stenosis.   Severe stenosis of the mid LAD at the LIMA insertion site, treated successfully with PCI using a single drug-eluting stent and hemodynamic support with an Impella CP device.  Assessment and Plan:  1. Chronic combined heart failure with LVEF 40-45%. At this time will add Lasix 20 mg every other day. Depending on stability in renal function we may try to put him back on low-dose ARB eventually as well.  2. Multivessel CAD status post CABG with graft disease as outlined above, now status post DES to the LAD at LIMA insertion site. He is not reporting any angina at this time. He continues on aspirin and Plavix.  3. History of CKD, stage 2-3. Creatinine 1.1-1.3.  4. Hyperlipidemia,  history of statin intolerance.  Current medicines were reviewed with the patient today.   Orders Placed This Encounter  Procedures  . Basic Metabolic Panel (BMET)    Disposition: Follow-up with me in 3 months.  Signed, [name], MD, [facility] 03/03/2016 10:18 AM    [facility] [address] [place], [place] [address] Phone: [phone]; Fax: [phone]

## 2016-03-03 ENCOUNTER — Ambulatory Visit (INDEPENDENT_AMBULATORY_CARE_PROVIDER_SITE_OTHER): Payer: Medicare Other | Admitting: Cardiology

## 2016-03-03 ENCOUNTER — Encounter: Payer: Self-pay | Admitting: Cardiology

## 2016-03-03 VITALS — BP 140/84 | HR 68 | Ht 69.0 in | Wt 202.0 lb

## 2016-03-03 DIAGNOSIS — E782 Mixed hyperlipidemia: Secondary | ICD-10-CM

## 2016-03-03 DIAGNOSIS — I2510 Atherosclerotic heart disease of native coronary artery without angina pectoris: Secondary | ICD-10-CM | POA: Diagnosis not present

## 2016-03-03 DIAGNOSIS — Z79899 Other long term (current) drug therapy: Secondary | ICD-10-CM

## 2016-03-03 DIAGNOSIS — I5042 Chronic combined systolic (congestive) and diastolic (congestive) heart failure: Secondary | ICD-10-CM

## 2016-03-03 DIAGNOSIS — N182 Chronic kidney disease, stage 2 (mild): Secondary | ICD-10-CM | POA: Diagnosis not present

## 2016-03-03 MED ORDER — FUROSEMIDE 20 MG PO TABS: 20.0000 mg | ORAL_TABLET | ORAL | 6 refills | Status: DC

## 2016-03-03 NOTE — Patient Instructions (Signed)
Your physician recommends that you schedule a follow-up appointment in: 3 Months with Dr. Domenic Polite.   Your physician has recommended you make the following change in your medication: Start Lasix 20 mg Every other day.   Your physician recommends that you return for lab work in: Just before your next visit.  If you need a refill on your cardiac medications before your next appointment, please call your pharmacy.  Thank you for choosing [facility]!

## 2016-06-02 NOTE — Progress Notes (Signed)
Cardiology Office Note  Date: 06/03/2016   ID: John [name], DOB 03-13-1930, MRN [id number]  PCP: Alonza Bogus, MD  Primary Cardiologist: [name], MD   Chief Complaint  Patient presents with  . Coronary Artery Disease    History of Present Illness: John [name] is an 82 y.o. male last seen in August. He presents for a follow-up visit. Reports no significant angina symptoms, still has chronic stable dyspnea on exertion.  At the last visit we placed him on Lasix 20 mg every other day. He thinks this may have helped somewhat as far as leg edema and shortness of breath.  Present medications include aspirin, Plavix, Toprol-XL, Norvasc, and as needed nitroglycerin. Intervention with DES to the LIMA to LAD insertion site was back in May of this year.  Past Medical History:  Diagnosis Date  . Acute ST elevation myocardial infarction (STEMI) of posterior wall ([place]) 11/11/2015  . Coronary atherosclerosis of native coronary artery    Multivessel status post CABG in 1992  . Essential hypertension, benign   . Malignant melanoma of skin   . Mixed hyperlipidemia   . Nephrolithiasis   . OSA (obstructive sleep apnea)   . Rosacea     Past Surgical History:  Procedure Laterality Date  . CARDIAC CATHETERIZATION  07/11/90  . CARDIAC CATHETERIZATION N/A 11/11/2015   Procedure: Left Heart Cath and Coronary Angiography;  Surgeon: [name], MD;  Location: [facility];  Service: Cardiovascular;  Laterality: N/A;  . CARDIAC CATHETERIZATION N/A 11/14/2015   Procedure: Coronary Stent Intervention w/Impella;  Surgeon: [name], MD;  Location: [facility];  Service: Cardiovascular;  Laterality: N/A;  . carotid duplex  06/24/2011, 05/12/2009   Right and Left ICAs: Demonstrates a small amount of irregular mixed density plaque with no evidence of diameter reduction, significant tortuosity or any other vascular abnormality. This is a mildly abnormal carotid duplex  doppler evaluation.  [name] CATARACT EXTRACTION W/PHACO Right 02/11/2014   Procedure: CATARACT EXTRACTION PHACO AND INTRAOCULAR LENS PLACEMENT (IOC);  Surgeon: [name], MD;  Location: [facility];  Service: Ophthalmology;  Laterality: Right;  CDE 13.83  . CHOLECYSTECTOMY    . CORONARY ARTERY BYPASS GRAFT  1992  . DOPPLER ECHOCARDIOGRAPHY  05/23/2007   Left ventricular systolic function is normal. The transmitral spectral doppler flow pattern is suggestive of impaired LV relaxation. No significant valvular abnormalities.  . heart monitor  04/01/2009   palpitations  . HERNIA REPAIR    . MELANOMA EXCISION     neck  . NM MYOCAR PERF EJECTION FRACTION  02/10/2011   The post stress myocaardial perfusion images show a normal pattern of perfusion in all regions. No significant wall motion abnormalities noted. EKG is negative for ischemia. This is a low risk scan.  [name] TOTAL KNEE ARTHROPLASTY      Current Outpatient Prescriptions  Medication Sig Dispense Refill  . acetaminophen (TYLENOL) 325 MG tablet Take 2 tablets (650 mg total) by mouth every 4 (four) hours as needed for headache or mild pain.    [name] amLODipine (NORVASC) 5 MG tablet Take 1 tablet (5 mg total) by mouth daily. 180 tablet 3  . aspirin EC 81 MG tablet Take 81 mg by mouth daily.    . clopidogrel (PLAVIX) 75 MG tablet Take 1 tablet (75 mg total) by mouth daily with breakfast. 30 tablet 11  . famotidine (PEPCID) 10 MG tablet Take 10 mg by mouth daily as needed for heartburn or indigestion.    [name]  metoprolol succinate (TOPROL-XL) 25 MG 24 hr tablet Take 1 tablet (25 mg total) by mouth 2 (two) times daily. 60 tablet 11  . nitroGLYCERIN (NITROSTAT) 0.4 MG SL tablet Place 1 tablet (0.4 mg total) under the tongue every 5 (five) minutes x 3 doses as needed for chest pain. 25 tablet 2  . pantoprazole (PROTONIX) 40 MG tablet Take 40 mg by mouth daily.    . tamsulosin (FLOMAX) 0.4 MG CAPS capsule Take 0.4 mg by mouth daily.     . furosemide (LASIX) 20 MG  tablet Take 1 tablet (20 mg total) by mouth every other day. 15 tablet 6   No current facility-administered medications for this visit.    Allergies:  Statins and Tape   Social History: The patient  reports that he quit smoking about 34 years ago. His smoking use included Cigarettes. He started smoking about 72 years ago. He has never used smokeless tobacco. He reports that he does not drink alcohol or use drugs.   ROS:  Please see the history of present illness. Otherwise, complete review of systems is positive for hearing loss.  All other systems are reviewed and negative.   Physical Exam: VS:  BP (!) 142/74   Pulse 66   Ht 5\' 9"  (1.753 m)   Wt 204 lb (92.5 kg)   SpO2 97%   BMI 30.13 kg/m , BMI Body mass index is 30.13 kg/m.  Wt Readings from Last 3 Encounters:  06/03/16 204 lb (92.5 kg)  03/03/16 202 lb (91.6 kg)  12/18/15 201 lb (91.2 kg)    General: Elderly male, appears comfortable at rest. HEENT: Conjunctiva and lids normal, oropharynx clear. Neck: Supple, no elevated JVP or carotid bruits, no thyromegaly. Lungs: Clear to auscultation, nonlabored breathing at rest. Cardiac: Regular rate and rhythm with ectopy, no S3, soft systollic murmur. Abdomen: Soft, nontender, bowel sounds present, no guarding or rebound. Extremities: 1+ lower leg and ankle edema, distal pulses 2+. Skin: Warm and dry. Musculoskeletal: No kyphosis. Neuropsychiatric: Alert and oriented 3, affect appropriate. Diminished hearing.  ECG: I personally reviewed the tracing from 11/15/2015 which showed sinus rhythm with prolonged PR interval, IVCD, and anterolateral ischemic changes.  Recent Labwork: 11/11/2015: B Natriuretic Peptide 747.1; TSH 2.104 12/18/2015: ALT 33; AST 31; BUN 18.4; Creatinine 1.2; HGB 15.0; Platelets 71; Potassium 4.0; Sodium 139     Component Value Date/Time   CHOL 170 11/12/2015 0432   TRIG 207 (H) 11/12/2015 0432   HDL 28 (L) 11/12/2015 0432   CHOLHDL 6.1 11/12/2015 0432   VLDL  41 (H) 11/12/2015 0432   LDLCALC 101 (H) 11/12/2015 0432    Other Studies Reviewed Today:  Echocardiogram 11/13/2015: Study Conclusions  - Left ventricle: The cavity size was normal. There was moderate focal basal hypertrophy of the septum. Systolic function was mildly to moderately reduced. The estimated ejection fraction was in the range of 40% to 45%. There is akinesis of the inferolateral myocardium. There is hypokinesis of the inferior myocardium. Features are consistent with a pseudonormal left ventricular filling pattern, with concomitant abnormal relaxation and increased filling pressure (grade 2 diastolic dysfunction). Doppler parameters are consistent with high ventricular filling pressure. - Aortic valve: There was mild regurgitation. - Aortic root: The aortic root was mildly dilated. - Left atrium: The atrium was moderately dilated.  Impressions:  - Akinesis of the inferolateral wall and hypokinesis of the inferior wall; overall mild to moderate LV dysfunction; grade 2 diastolic dysfunction with elevated LV filling pressure; mild AI; moderate  LAE; mildly dilated aortic root (43 mm); suggest CTA or MRA to further assess.  Cardiac catheterization 11/11/2015:  Sequential SVG .  The first limb of the sequential SVG-diagonal/ramus intermedius is patent, but the second limb is occluded.  Origin lesion, before 1st Diag, 40% stenosed.  Prox RCA to Mid RCA lesion, 100% stenosed.  LIMA .  The LIMA is patent, but there is a tight lesion just beyond the insertion site in the mid-LAD  Mid LAD to Dist LAD lesion, 90% stenosed.  Ost LM lesion, 100% stenosed.  Ost Cx lesion, 100% stenosed.  There is severe left ventricular systolic dysfunction.  1. Severe native vessel CAD with total occlusion of the left main and RCA 2. S/P CABG with continued patency of the LIMA-LAD and first limb of the sequential SVG-diagonal 3. Total occlusion of the  second limb of the SVG-ramus graft (culprit vessel) 4. Severe stenosis of the mid-LAD at the LIMA anastomotic site 5. Severe segmental LV systolic dysfunction  Coronary PCI 11/14/2015:  Sequential SVG .  The first limb of the sequential SVG-diagonal/ramus intermedius is patent, but the second limb is occluded.  Origin lesion, before 1st Diag, 40% stenosed.  Prox RCA to Mid RCA lesion, 100% stenosed.  LIMA .  The LIMA is patent, but there is a tight lesion just beyond the insertion site in the mid-LAD  Ost LM lesion, 100% stenosed.  Ost Cx lesion, 100% stenosed.  Mid LAD to Dist LAD lesion, 90% stenosed. Post intervention, there is a 0% residual stenosis.  Severe stenosis of the mid LAD at the LIMA insertion site, treated successfully with PCI using a single drug-eluting stent and hemodynamic support with an Impella CP device.  Assessment and Plan:  1. Multivessel CAD status post CABG with DES to the LIMA to LAD insertion site in May of this year. He continues on DAPT, no active angina symptoms.  2. Essential hypertension, no changes made to current regimen.  3. Ischemic cardiomyopathy with LVEF 40-45%. We are continuing on diuretics. Not on ARB with prior renal insufficiency.  4. CKD stage 2-3.  Current medicines were reviewed with the patient today.   Disposition: Follow-up in 6 months, sooner if needed.  Signed, [name], MD, [facility] 06/03/2016 9:53 AM    [place] [facility] [address], [place], [place] [address] Phone: [phone]; Fax: [phone]

## 2016-06-03 ENCOUNTER — Ambulatory Visit (INDEPENDENT_AMBULATORY_CARE_PROVIDER_SITE_OTHER): Payer: Medicare Other | Admitting: Cardiology

## 2016-06-03 ENCOUNTER — Encounter: Payer: Self-pay | Admitting: Cardiology

## 2016-06-03 VITALS — BP 142/74 | HR 66 | Ht 69.0 in | Wt 204.0 lb

## 2016-06-03 DIAGNOSIS — I2510 Atherosclerotic heart disease of native coronary artery without angina pectoris: Secondary | ICD-10-CM | POA: Diagnosis not present

## 2016-06-03 DIAGNOSIS — N183 Chronic kidney disease, stage 3 unspecified: Secondary | ICD-10-CM

## 2016-06-03 DIAGNOSIS — I255 Ischemic cardiomyopathy: Secondary | ICD-10-CM | POA: Diagnosis not present

## 2016-06-03 DIAGNOSIS — I10 Essential (primary) hypertension: Secondary | ICD-10-CM

## 2016-06-03 NOTE — Patient Instructions (Signed)
Your physician wants you to follow-up in: 6 months with Dr McDowell You will receive a reminder letter in the mail two months in advance. If you don't receive a letter, please call our office to schedule the follow-up appointment.     Your physician recommends that you continue on your current medications as directed. Please refer to the Current Medication list given to you today.    If you need a refill on your cardiac medications before your next appointment, please call your pharmacy.     Thank you for choosing [facility] !        

## 2016-09-20 DIAGNOSIS — Z0000 Encounter for general adult medical examination without abnormal findings: Secondary | ICD-10-CM | POA: Diagnosis not present

## 2016-09-21 DIAGNOSIS — Z0000 Encounter for general adult medical examination without abnormal findings: Secondary | ICD-10-CM | POA: Diagnosis not present

## 2016-09-21 DIAGNOSIS — G4730 Sleep apnea, unspecified: Secondary | ICD-10-CM | POA: Diagnosis not present

## 2016-09-21 DIAGNOSIS — E785 Hyperlipidemia, unspecified: Secondary | ICD-10-CM | POA: Diagnosis not present

## 2016-09-21 DIAGNOSIS — M1990 Unspecified osteoarthritis, unspecified site: Secondary | ICD-10-CM | POA: Diagnosis not present

## 2016-09-21 DIAGNOSIS — I491 Atrial premature depolarization: Secondary | ICD-10-CM | POA: Diagnosis not present

## 2016-09-22 ENCOUNTER — Other Ambulatory Visit (HOSPITAL_COMMUNITY): Payer: Self-pay | Admitting: Pulmonary Disease

## 2016-09-22 DIAGNOSIS — R222 Localized swelling, mass and lump, trunk: Secondary | ICD-10-CM

## 2016-10-01 ENCOUNTER — Ambulatory Visit (HOSPITAL_COMMUNITY)
Admission: RE | Admit: 2016-10-01 | Discharge: 2016-10-01 | Disposition: A | Discharge status: Home / Self Care | Payer: Medicare Other | Source: Ambulatory Visit | Attending: Pulmonary Disease | Admitting: Pulmonary Disease

## 2016-10-01 DIAGNOSIS — R221 Localized swelling, mass and lump, neck: Secondary | ICD-10-CM | POA: Diagnosis not present

## 2016-10-01 DIAGNOSIS — M899 Disorder of bone, unspecified: Secondary | ICD-10-CM | POA: Insufficient documentation

## 2016-10-01 DIAGNOSIS — R222 Localized swelling, mass and lump, trunk: Secondary | ICD-10-CM | POA: Diagnosis not present

## 2016-10-01 DIAGNOSIS — R590 Localized enlarged lymph nodes: Secondary | ICD-10-CM | POA: Diagnosis not present

## 2016-10-01 DIAGNOSIS — Z859 Personal history of malignant neoplasm, unspecified: Secondary | ICD-10-CM | POA: Diagnosis not present

## 2016-10-01 LAB — POCT I-STAT CREATININE: Creatinine, Ser: 1.4 mg/dL — ABNORMAL HIGH (ref 0.61–1.24)

## 2016-10-01 MED ORDER — IOPAMIDOL (ISOVUE-300) INJECTION 61%
75.0000 mL | Freq: Once | INTRAVENOUS | Status: AC | PRN
  Administered 2016-10-01: 75 mL via INTRAVENOUS

## 2016-10-08 ENCOUNTER — Other Ambulatory Visit (HOSPITAL_COMMUNITY): Payer: Self-pay | Admitting: Pulmonary Disease

## 2016-10-08 DIAGNOSIS — M899 Disorder of bone, unspecified: Secondary | ICD-10-CM

## 2016-10-14 ENCOUNTER — Ambulatory Visit (HOSPITAL_COMMUNITY)
Admission: RE | Admit: 2016-10-14 | Discharge: 2016-10-14 | Disposition: A | Discharge status: Home / Self Care | Payer: Medicare Other | Source: Ambulatory Visit | Attending: Pulmonary Disease | Admitting: Pulmonary Disease

## 2016-10-14 DIAGNOSIS — R937 Abnormal findings on diagnostic imaging of other parts of musculoskeletal system: Secondary | ICD-10-CM | POA: Diagnosis present

## 2016-10-14 DIAGNOSIS — M899 Disorder of bone, unspecified: Secondary | ICD-10-CM | POA: Insufficient documentation

## 2016-10-14 DIAGNOSIS — M47812 Spondylosis without myelopathy or radiculopathy, cervical region: Secondary | ICD-10-CM | POA: Diagnosis not present

## 2016-10-14 DIAGNOSIS — M4854XA Collapsed vertebra, not elsewhere classified, thoracic region, initial encounter for fracture: Secondary | ICD-10-CM | POA: Insufficient documentation

## 2016-10-14 MED ORDER — GADOBENATE DIMEGLUMINE 529 MG/ML IV SOLN
19.0000 mL | Freq: Once | INTRAVENOUS | Status: AC | PRN
  Administered 2016-10-14: 19 mL via INTRAVENOUS

## 2016-10-28 DIAGNOSIS — L570 Actinic keratosis: Secondary | ICD-10-CM | POA: Diagnosis not present

## 2016-11-05 ENCOUNTER — Other Ambulatory Visit: Payer: Self-pay | Admitting: Cardiology

## 2016-11-09 ENCOUNTER — Other Ambulatory Visit: Payer: Self-pay | Admitting: Cardiology

## 2016-11-12 ENCOUNTER — Other Ambulatory Visit: Payer: Self-pay | Admitting: Cardiology

## 2016-11-29 NOTE — Progress Notes (Deleted)
Cardiology Office Note  Date: 11/29/2016   ID: John [name], DOB [birth year], MRN [id number]  PCP: Sinda Du, MD  Primary Cardiologist: [name], MD   No chief complaint on file.   History of Present Illness: John [name] is an 83 y.o. male last seen in November 2017.  Past Medical History:  Diagnosis Date  . Acute ST elevation myocardial infarction (STEMI) of posterior wall ([place]) 11/11/2015  . Coronary atherosclerosis of native coronary artery    Multivessel status post CABG in 1992  . Essential hypertension, benign   . Malignant melanoma of skin   . Mixed hyperlipidemia   . Nephrolithiasis   . OSA (obstructive sleep apnea)   . Rosacea     Past Surgical History:  Procedure Laterality Date  . CARDIAC CATHETERIZATION  07/11/90  . CARDIAC CATHETERIZATION N/A 11/11/2015   Procedure: Left Heart Cath and Coronary Angiography;  Surgeon: [name], MD;  Location: [facility];  Service: Cardiovascular;  Laterality: N/A;  . CARDIAC CATHETERIZATION N/A 11/14/2015   Procedure: Coronary Stent Intervention w/Impella;  Surgeon: [name], MD;  Location: [facility];  Service: Cardiovascular;  Laterality: N/A;  . carotid duplex  06/24/2011, 05/12/2009   Right and Left ICAs: Demonstrates a small amount of irregular mixed density plaque with no evidence of diameter reduction, significant tortuosity or any other vascular abnormality. This is a mildly abnormal carotid duplex doppler evaluation.  [name] CATARACT EXTRACTION W/PHACO Right 02/11/2014   Procedure: CATARACT EXTRACTION PHACO AND INTRAOCULAR LENS PLACEMENT (IOC);  Surgeon: [name], MD;  Location: [facility];  Service: Ophthalmology;  Laterality: Right;  CDE 13.83  . CHOLECYSTECTOMY    . CORONARY ARTERY BYPASS GRAFT  1992  . DOPPLER ECHOCARDIOGRAPHY  05/23/2007   Left ventricular systolic function is normal. The transmitral spectral doppler flow pattern is suggestive of impaired LV relaxation. No  significant valvular abnormalities.  . heart monitor  04/01/2009   palpitations  . HERNIA REPAIR    . MELANOMA EXCISION     neck  . NM MYOCAR PERF EJECTION FRACTION  02/10/2011   The post stress myocaardial perfusion images show a normal pattern of perfusion in all regions. No significant wall motion abnormalities noted. EKG is negative for ischemia. This is a low risk scan.  [name] TOTAL KNEE ARTHROPLASTY      Current Outpatient Prescriptions  Medication Sig Dispense Refill  . acetaminophen (TYLENOL) 325 MG tablet Take 2 tablets (650 mg total) by mouth every 4 (four) hours as needed for headache or mild pain.    [name] amLODipine (NORVASC) 5 MG tablet Take 1 tablet (5 mg total) by mouth daily. 180 tablet 3  . aspirin EC 81 MG tablet Take 81 mg by mouth daily.    . clopidogrel (PLAVIX) 75 MG tablet TAKE ONE TABLET BY MOUTH ONCE DAILY WITH BREAKFAST 90 tablet 1  . famotidine (PEPCID) 10 MG tablet Take 10 mg by mouth daily as needed for heartburn or indigestion.    . furosemide (LASIX) 20 MG tablet TAKE ONE TABLET BY MOUTH EVERY OTHER DAY 15 tablet 3  . metoprolol succinate (TOPROL-XL) 25 MG 24 hr tablet TAKE ONE TABLET BY MOUTH TWICE DAILY 60 tablet 11  . nitroGLYCERIN (NITROSTAT) 0.4 MG SL tablet Place 1 tablet (0.4 mg total) under the tongue every 5 (five) minutes x 3 doses as needed for chest pain. 25 tablet 2  . pantoprazole (PROTONIX) 40 MG tablet Take 40 mg by mouth daily.    [name]  tamsulosin (FLOMAX) 0.4 MG CAPS capsule Take 0.4 mg by mouth daily.      No current facility-administered medications for this visit.    Allergies:  Statins and Tape   Social History: The patient  reports that he quit smoking about 35 years ago. His smoking use included Cigarettes. He started smoking about 73 years ago. He has never used smokeless tobacco. He reports that he does not drink alcohol or use drugs.   Family History: The patient's family history includes Heart attack in his brother and father; Skin cancer in  his brother.   ROS:  Please see the history of present illness. Otherwise, complete review of systems is positive for {NONE DEFAULTED:18576::"none"}.  All other systems are reviewed and negative.   Physical Exam: VS:  There were no vitals taken for this visit., BMI There is no height or weight on file to calculate BMI.  Wt Readings from Last 3 Encounters:  06/03/16 204 lb (92.5 kg)  03/03/16 202 lb (91.6 kg)  12/18/15 201 lb (91.2 kg)    General: Elderly male,appears comfortable at rest. HEENT: Conjunctiva and lids normal, oropharynx clear. Neck: Supple, no elevated JVP or carotid bruits, no thyromegaly. Lungs: Clear to auscultation, nonlabored breathing at rest. Cardiac: Regular rate and rhythm with ectopy, no S3, soft systollic murmur. Abdomen: Soft, nontender, bowel sounds present, no guarding or rebound. Extremities: 1+ lower leg and ankleedema, distal pulses 2+. Skin: Warm and dry. Musculoskeletal: No kyphosis. Neuropsychiatric: Alert and oriented 3, affect appropriate.Diminished hearing.  ECG: I personally reviewed the tracing from 11/15/2015 which showed sinus rhythm with prolonged PR interval, IVCD, and anterolateral ischemic changes.  Recent Labwork: 12/18/2015: ALT 33; AST 31; BUN 18.4; HGB 15.0; Platelets 71; Potassium 4.0; Sodium 139 10/01/2016: Creatinine, Ser 1.40     Component Value Date/Time   CHOL 170 11/12/2015 0432   TRIG 207 (H) 11/12/2015 0432   HDL 28 (L) 11/12/2015 0432   CHOLHDL 6.1 11/12/2015 0432   VLDL 41 (H) 11/12/2015 0432   LDLCALC 101 (H) 11/12/2015 0432    Other Studies Reviewed Today:  Echocardiogram 11/13/2015: Study Conclusions  - Left ventricle: The cavity size was normal. There was moderate focal basal hypertrophy of the septum. Systolic function was mildly to moderately reduced. The estimated ejection fraction was in the range of 40% to 45%. There is akinesis of the inferolateral myocardium. There is hypokinesis of the  inferior myocardium. Features are consistent with a pseudonormal left ventricular filling pattern, with concomitant abnormal relaxation and increased filling pressure (grade 2 diastolic dysfunction). Doppler parameters are consistent with high ventricular filling pressure. - Aortic valve: There was mild regurgitation. - Aortic root: The aortic root was mildly dilated. - Left atrium: The atrium was moderately dilated.  Impressions:  - Akinesis of the inferolateral wall and hypokinesis of the inferior wall; overall mild to moderate LV dysfunction; grade 2 diastolic dysfunction with elevated LV filling pressure; mild AI; moderate LAE; mildly dilated aortic root (43 mm); suggest CTA or MRA to further assess.  Cardiac catheterization 11/11/2015:  Sequential SVG .  The first limb of the sequential SVG-diagonal/ramus intermedius is patent, but the second limb is occluded.  Origin lesion, before 1st Diag, 40% stenosed.  Prox RCA to Mid RCA lesion, 100% stenosed.  LIMA .  The LIMA is patent, but there is a tight lesion just beyond the insertion site in the mid-LAD  Mid LAD to Dist LAD lesion, 90% stenosed.  Ost LM lesion, 100% stenosed.  Ost Cx lesion, 100%  stenosed.  There is severe left ventricular systolic dysfunction.  1. Severe native vessel CAD with total occlusion of the left main and RCA 2. S/P CABG with continued patency of the LIMA-LAD and first limb of the sequential SVG-diagonal 3. Total occlusion of the second limb of the SVG-ramus graft (culprit vessel) 4. Severe stenosis of the mid-LAD at the LIMA anastomotic site 5. Severe segmental LV systolic dysfunction  Coronary PCI 11/14/2015:  Sequential SVG .  The first limb of the sequential SVG-diagonal/ramus intermedius is patent, but the second limb is occluded.  Origin lesion, before 1st Diag, 40% stenosed.  Prox RCA to Mid RCA lesion, 100% stenosed.  LIMA .  The LIMA is patent, but  there is a tight lesion just beyond the insertion site in the mid-LAD  Ost LM lesion, 100% stenosed.  Ost Cx lesion, 100% stenosed.  Mid LAD to Dist LAD lesion, 90% stenosed. Post intervention, there is a 0% residual stenosis.  Severe stenosis of the mid LAD at the LIMA insertion site, treated successfully with PCI using a single drug-eluting stent and hemodynamic support with an Impella CP device.  Assessment and Plan:   Current medicines were reviewed with the patient today.  No orders of the defined types were placed in this encounter.   Disposition:  Signed, [name], MD, [facility] 11/29/2016 9:37 AM    [facility]. [address] [place], [place] [address] Phone: [phone]; Fax: [phone]

## 2016-11-30 ENCOUNTER — Ambulatory Visit: Payer: Medicare Other | Admitting: Cardiology

## 2016-11-30 NOTE — Progress Notes (Signed)
Cardiology Office Note  Date: 12/01/2016   ID: John [name], DOB [birth year], MRN [id number]  PCP: Sinda Du, MD  Primary Cardiologist: [name], MD   Chief Complaint  Patient presents with  . Coronary Artery Disease    History of Present Illness: John [name] is an 83 y.o. male last seen in November 2017. He presents for a routine follow-up visit. Reports intermittent shortness of breath with activity, but still stays active outdoors and paces himself. He is not reporting any angina or nitroglycerin use.  He underwent placement of DES to the LIMA to LAD insertion site in May 2017. We went over his medications. Plan to continue aspirin and Plavix at this point which he has tolerated well.  I personally reviewed his ECG today which shows sinus rhythm with prolonged PR interval and LV atria repolarization abnormalities.  He continues to follow with Dr. Luan Pulling.  Past Medical History:  Diagnosis Date  . Acute ST elevation myocardial infarction (STEMI) of posterior wall ([place]) 11/11/2015  . Coronary atherosclerosis of native coronary artery    Multivessel status post CABG in 1992  . Essential hypertension, benign   . Malignant melanoma of skin   . Mixed hyperlipidemia   . Nephrolithiasis   . OSA (obstructive sleep apnea)   . Rosacea     Past Surgical History:  Procedure Laterality Date  . CARDIAC CATHETERIZATION  07/11/90  . CARDIAC CATHETERIZATION N/A 11/11/2015   Procedure: Left Heart Cath and Coronary Angiography;  Surgeon: [name], MD;  Location: [facility];  Service: Cardiovascular;  Laterality: N/A;  . CARDIAC CATHETERIZATION N/A 11/14/2015   Procedure: Coronary Stent Intervention w/Impella;  Surgeon: [name], MD;  Location: [facility];  Service: Cardiovascular;  Laterality: N/A;  . carotid duplex  06/24/2011, 05/12/2009   Right and Left ICAs: Demonstrates a small amount of irregular mixed density plaque with no evidence of  diameter reduction, significant tortuosity or any other vascular abnormality. This is a mildly abnormal carotid duplex doppler evaluation.  [name] CATARACT EXTRACTION W/PHACO Right 02/11/2014   Procedure: CATARACT EXTRACTION PHACO AND INTRAOCULAR LENS PLACEMENT (IOC);  Surgeon: [name], MD;  Location: [facility];  Service: Ophthalmology;  Laterality: Right;  CDE 13.83  . CHOLECYSTECTOMY    . CORONARY ARTERY BYPASS GRAFT  1992  . DOPPLER ECHOCARDIOGRAPHY  05/23/2007   Left ventricular systolic function is normal. The transmitral spectral doppler flow pattern is suggestive of impaired LV relaxation. No significant valvular abnormalities.  . heart monitor  04/01/2009   palpitations  . HERNIA REPAIR    . MELANOMA EXCISION     neck  . NM MYOCAR PERF EJECTION FRACTION  02/10/2011   The post stress myocaardial perfusion images show a normal pattern of perfusion in all regions. No significant wall motion abnormalities noted. EKG is negative for ischemia. This is a low risk scan.  [name] TOTAL KNEE ARTHROPLASTY      Current Outpatient Prescriptions  Medication Sig Dispense Refill  . acetaminophen (TYLENOL) 325 MG tablet Take 2 tablets (650 mg total) by mouth every 4 (four) hours as needed for headache or mild pain.    [name] amLODipine (NORVASC) 5 MG tablet Take 1 tablet (5 mg total) by mouth daily. 180 tablet 3  . aspirin EC 81 MG tablet Take 81 mg by mouth daily.    . clopidogrel (PLAVIX) 75 MG tablet TAKE ONE TABLET BY MOUTH ONCE DAILY WITH BREAKFAST 90 tablet 1  . famotidine (PEPCID) 10 MG  tablet Take 10 mg by mouth daily as needed for heartburn or indigestion.    . furosemide (LASIX) 20 MG tablet TAKE ONE TABLET BY MOUTH EVERY OTHER DAY 15 tablet 3  . metoprolol succinate (TOPROL-XL) 25 MG 24 hr tablet TAKE ONE TABLET BY MOUTH TWICE DAILY 60 tablet 11  . nitroGLYCERIN (NITROSTAT) 0.4 MG SL tablet Place 1 tablet (0.4 mg total) under the tongue every 5 (five) minutes x 3 doses as needed for chest pain. 25 tablet 2    . pantoprazole (PROTONIX) 40 MG tablet Take 40 mg by mouth daily.    . tamsulosin (FLOMAX) 0.4 MG CAPS capsule Take 0.4 mg by mouth daily.      No current facility-administered medications for this visit.    Allergies:  Statins and Tape   Social History: The patient  reports that he quit smoking about 35 years ago. His smoking use included Cigarettes. He started smoking about 73 years ago. He has never used smokeless tobacco. He reports that he does not drink alcohol or use drugs.   ROS:  Please see the history of present illness. Otherwise, complete review of systems is positive for hearing loss and arthritic stiffness.  All other systems are reviewed and negative.   Physical Exam: VS:  BP 132/72   Pulse 74   Ht 5\' 10"  (1.778 m)   Wt 202 lb (91.6 kg)   SpO2 97%   BMI 28.98 kg/m , BMI Body mass index is 28.98 kg/m.  Wt Readings from Last 3 Encounters:  12/01/16 202 lb (91.6 kg)  06/03/16 204 lb (92.5 kg)  03/03/16 202 lb (91.6 kg)    General: Elderly male,appears comfortable at rest. HEENT: Conjunctiva and lids normal, oropharynx clear. Neck: Supple, no elevated JVP or carotid bruits, no thyromegaly. Lungs: Clear to auscultation, nonlabored breathing at rest. Cardiac: Regular rate and rhythm with ectopy, no S3, soft systollic murmur. Abdomen: Soft, nontender, bowel sounds present, no guarding or rebound. Extremities: 1+ lower leg and ankleedema, distal pulses 2+. Skin: Warm and dry. Musculoskeletal: No kyphosis. Neuropsychiatric: Alert and oriented 3, affect appropriate.Diminished hearing.  ECG: I personally reviewed the tracing from 11/15/2015 which showed sinus rhythm with prolonged PR interval, IVCD, and anterolateral ischemic changes.  Recent Labwork: 12/18/2015: ALT 33; AST 31; BUN 18.4; HGB 15.0; Platelets 71; Potassium 4.0; Sodium 139 10/01/2016: Creatinine, Ser 1.40     Component Value Date/Time   CHOL 170 11/12/2015 0432   TRIG 207 (H) 11/12/2015 0432   HDL  28 (L) 11/12/2015 0432   CHOLHDL 6.1 11/12/2015 0432   VLDL 41 (H) 11/12/2015 0432   LDLCALC 101 (H) 11/12/2015 0432    Other Studies Reviewed Today:  Echocardiogram 11/13/2015: Study Conclusions  - Left ventricle: The cavity size was normal. There was moderate focal basal hypertrophy of the septum. Systolic function was mildly to moderately reduced. The estimated ejection fraction was in the range of 40% to 45%. There is akinesis of the inferolateral myocardium. There is hypokinesis of the inferior myocardium. Features are consistent with a pseudonormal left ventricular filling pattern, with concomitant abnormal relaxation and increased filling pressure (grade 2 diastolic dysfunction). Doppler parameters are consistent with high ventricular filling pressure. - Aortic valve: There was mild regurgitation. - Aortic root: The aortic root was mildly dilated. - Left atrium: The atrium was moderately dilated.  Impressions:  - Akinesis of the inferolateral wall and hypokinesis of the inferior wall; overall mild to moderate LV dysfunction; grade 2 diastolic dysfunction with elevated LV filling  pressure; mild AI; moderate LAE; mildly dilated aortic root (43 mm); suggest CTA or MRA to further assess.  Cardiac catheterization 11/11/2015:  Sequential SVG .  The first limb of the sequential SVG-diagonal/ramus intermedius is patent, but the second limb is occluded.  Origin lesion, before 1st Diag, 40% stenosed.  Prox RCA to Mid RCA lesion, 100% stenosed.  LIMA .  The LIMA is patent, but there is a tight lesion just beyond the insertion site in the mid-LAD  Mid LAD to Dist LAD lesion, 90% stenosed.  Ost LM lesion, 100% stenosed.  Ost Cx lesion, 100% stenosed.  There is severe left ventricular systolic dysfunction.  1. Severe native vessel CAD with total occlusion of the left main and RCA 2. S/P CABG with continued patency of the LIMA-LAD and first  limb of the sequential SVG-diagonal 3. Total occlusion of the second limb of the SVG-ramus graft (culprit vessel) 4. Severe stenosis of the mid-LAD at the LIMA anastomotic site 5. Severe segmental LV systolic dysfunction  Coronary PCI 11/14/2015:  Sequential SVG .  The first limb of the sequential SVG-diagonal/ramus intermedius is patent, but the second limb is occluded.  Origin lesion, before 1st Diag, 40% stenosed.  Prox RCA to Mid RCA lesion, 100% stenosed.  LIMA .  The LIMA is patent, but there is a tight lesion just beyond the insertion site in the mid-LAD  Ost LM lesion, 100% stenosed.  Ost Cx lesion, 100% stenosed.  Mid LAD to Dist LAD lesion, 90% stenosed. Post intervention, there is a 0% residual stenosis.  Severe stenosis of the mid LAD at the LIMA insertion site, treated successfully with PCI using a single drug-eluting stent and hemodynamic support with an Impella CP device.  Assessment and Plan:  1. Multivessel CAD status post CABG in 1992, more recently status post DES intervention to the LAD at LIMA insertion site in May 2017. He is clinically stable at this time on medical therapy. Plan to continue the DAPT.  2. Essential hypertension, continues on Norvasc and Toprol-XL. Systolic blood pressure in the 130s. No changes made today.  3. Ischemic cardiomyopathy with LVEF approximately 40-45%. Volume status looks to be stable, weight is down a few pounds. He continues on low-dose Lasix.  4. CKD stage 3, last creatinine 1.4.  Current medicines were reviewed with the patient today.   Orders Placed This Encounter  Procedures  . EKG 12-Lead    Disposition: Follow-up in 6 months.  Signed, [name], MD, [facility] 12/01/2016 10:02 AM    [facility]. [address], [place], [place] [address] Phone: [phone]; Fax: [phone]

## 2016-12-01 ENCOUNTER — Encounter: Payer: Self-pay | Admitting: Cardiology

## 2016-12-01 ENCOUNTER — Ambulatory Visit (INDEPENDENT_AMBULATORY_CARE_PROVIDER_SITE_OTHER): Payer: Medicare Other | Admitting: Cardiology

## 2016-12-01 VITALS — BP 132/72 | HR 74 | Ht 70.0 in | Wt 202.0 lb

## 2016-12-01 DIAGNOSIS — I10 Essential (primary) hypertension: Secondary | ICD-10-CM

## 2016-12-01 DIAGNOSIS — N183 Chronic kidney disease, stage 3 unspecified: Secondary | ICD-10-CM

## 2016-12-01 DIAGNOSIS — I25119 Atherosclerotic heart disease of native coronary artery with unspecified angina pectoris: Secondary | ICD-10-CM | POA: Diagnosis not present

## 2016-12-01 DIAGNOSIS — I255 Ischemic cardiomyopathy: Secondary | ICD-10-CM

## 2016-12-01 NOTE — Patient Instructions (Signed)
Your physician wants you to follow-up in: 6 months Dr McDowell You will receive a reminder letter in the mail two months in advance. If you don't receive a letter, please call our office to schedule the follow-up appointment.  Your physician recommends that you continue on your current medications as directed. Please refer to the Current Medication list given to you today.    If you need a refill on your cardiac medications before your next appointment, please call your pharmacy.       Thank you for choosing [facility] !         

## 2016-12-06 ENCOUNTER — Other Ambulatory Visit: Payer: Self-pay | Admitting: Cardiology

## 2016-12-08 ENCOUNTER — Other Ambulatory Visit: Payer: Self-pay | Admitting: Cardiology

## 2016-12-10 ENCOUNTER — Other Ambulatory Visit: Payer: Self-pay | Admitting: *Deleted

## 2016-12-10 ENCOUNTER — Other Ambulatory Visit: Payer: Self-pay | Admitting: Cardiology

## 2016-12-10 NOTE — Telephone Encounter (Signed)
REFILL 

## 2016-12-16 ENCOUNTER — Other Ambulatory Visit (HOSPITAL_BASED_OUTPATIENT_CLINIC_OR_DEPARTMENT_OTHER): Payer: Medicare Other

## 2016-12-16 ENCOUNTER — Telehealth: Payer: Self-pay | Admitting: Oncology

## 2016-12-16 ENCOUNTER — Ambulatory Visit (HOSPITAL_BASED_OUTPATIENT_CLINIC_OR_DEPARTMENT_OTHER): Payer: Medicare Other | Admitting: Oncology

## 2016-12-16 VITALS — BP 160/77 | HR 59 | Temp 97.8°F | Resp 18 | Ht 70.0 in | Wt 201.1 lb

## 2016-12-16 DIAGNOSIS — D696 Thrombocytopenia, unspecified: Secondary | ICD-10-CM | POA: Diagnosis not present

## 2016-12-16 DIAGNOSIS — Z85820 Personal history of malignant melanoma of skin: Secondary | ICD-10-CM

## 2016-12-16 DIAGNOSIS — C439 Malignant melanoma of skin, unspecified: Secondary | ICD-10-CM

## 2016-12-16 LAB — CBC WITH DIFFERENTIAL/PLATELET
BASO%: 1 % (ref 0.0–2.0)
Basophils Absolute: 0.1 10*3/uL (ref 0.0–0.1)
EOS%: 4.8 % (ref 0.0–7.0)
Eosinophils Absolute: 0.3 10*3/uL (ref 0.0–0.5)
HCT: 46.5 % (ref 38.4–49.9)
HGB: 16 g/dL (ref 13.0–17.1)
LYMPH%: 41.5 % (ref 14.0–49.0)
MCH: 31.1 pg (ref 27.2–33.4)
MCHC: 34.4 g/dL (ref 32.0–36.0)
MCV: 90.3 fL (ref 79.3–98.0)
MONO#: 0.4 10*3/uL (ref 0.1–0.9)
MONO%: 6.3 % (ref 0.0–14.0)
NEUT#: 2.6 10*3/uL (ref 1.5–6.5)
NEUT%: 46.4 % (ref 39.0–75.0)
Platelets: 86 10*3/uL — ABNORMAL LOW (ref 140–400)
RBC: 5.15 10*6/uL (ref 4.20–5.82)
RDW: 13.9 % (ref 11.0–14.6)
WBC: 5.6 10*3/uL (ref 4.0–10.3)
lymph#: 2.3 10*3/uL (ref 0.9–3.3)

## 2016-12-16 NOTE — Progress Notes (Signed)
Hematology and Oncology Follow Up Visit  John [name] [id number] [birth year] 83 y.o. 12/16/2016 10:24 AM    CC: John [name] John [name], M.D. [name], MD [name]. [name], M.D.    Principle Diagnosis: 83 year old gentleman with;  1. 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  2. 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  Prior Therapy: He is S/P wide excision and lymph node dissection.  Pathology revealed a Clark level III, 1.25-mm melanoma without ulceration or vascular invasion.  He had 0/21 lymph nodes involved.  Did not receive any adjuvant therapy.  Current therapy: Observation and surveillance.  Interim History:   [name] presents today for a followup visit with his wife. Since his last visit, he reports no major changes in his health. He did have a myocardial infarction last year and for recovered at this time. He did notice a lump on his right neck that was investigated with a CT scan and MRI without any clear cut abnormalities.  He denies any skin rashes or lesions. He denied any lymphadenopathy or masses at this time. He continues to be reasonably active and performs activities of daily living. He denied any recent hospitalization or illnesses. He denied any hematochezia, melena or petechiae or rash.  He has not reported any headaches or blurry vision or double vision. Has not reported any syncope. Did not report any chest pain or shortness of breath. Does not report any abdominal pain or early satiety. Did not report any leg edema or arthralgias. Rest of his review of system is unremarkable.  Medication reviewed without any changes. Current Outpatient Prescriptions  Medication Sig Dispense Refill  . acetaminophen (TYLENOL) 325 MG tablet Take 2 tablets (650 mg total) by mouth every 4 (four) hours as needed for headache or mild pain.    [name] amLODipine (NORVASC) 5 MG tablet Take 1 tablet (5 mg total) by mouth daily. 180 tablet 3  .  aspirin EC 81 MG tablet Take 81 mg by mouth daily.    . clopidogrel (PLAVIX) 75 MG tablet TAKE ONE TABLET BY MOUTH ONCE DAILY WITH BREAKFAST 90 tablet 1  . famotidine (PEPCID) 10 MG tablet Take 10 mg by mouth daily as needed for heartburn or indigestion.    . furosemide (LASIX) 20 MG tablet TAKE ONE TABLET BY MOUTH EVERY OTHER DAY 15 tablet 3  . losartan (COZAAR) 25 MG tablet TAKE ONE TABLET BY MOUTH ONCE DAILY 180 tablet 3  . metoprolol succinate (TOPROL-XL) 25 MG 24 hr tablet TAKE ONE TABLET BY MOUTH TWICE DAILY 60 tablet 11  . nitroGLYCERIN (NITROSTAT) 0.4 MG SL tablet Place 1 tablet (0.4 mg total) under the tongue every 5 (five) minutes x 3 doses as needed for chest pain. 25 tablet 2  . pantoprazole (PROTONIX) 40 MG tablet Take 40 mg by mouth daily.    . tamsulosin (FLOMAX) 0.4 MG CAPS capsule Take 0.4 mg by mouth daily.      No current facility-administered medications for this visit.      Allergies:  Allergies  Allergen Reactions  . Statins Other (See Comments)    Weak,nervous  . Tape Rash     Physical Exam: Blood pressure (!) 160/77, pulse (!) 59, temperature 97.8 F (36.6 C), temperature source Oral, resp. rate 18, height 5\' 10"  (1.778 m), weight 201 lb 1.6 oz (91.2 kg), SpO2 97 %. ECOG: 1 General appearance: Alert, awake gentleman without distress. Head: Normocephalic, without obvious abnormality Neck: no adenopathy. Could not  appreciate any adenopathy or masses. Scar tissue palpated around the right cervical area. Well-healed scar noted. Lymph nodes: Cervical, supraclavicular, and axillary nodes normal. Heart:regular rate and rhythm, S1, S2.  Lung:chest clear, no wheezing, rales, normal symmetric air entry Abdomin: soft, non-tender, without masses or organomegaly no rebound or guarding. EXT:no erythema, induration, or nodules Skin: No masses or lesions noted. No petechiae noted.   Lab Results: Lab Results  Component Value Date   WBC 5.6 12/16/2016   HGB 16.0  12/16/2016   HCT 46.5 12/16/2016   MCV 90.3 12/16/2016   PLT 86 (L) 12/16/2016     Chemistry      Component Value Date/Time   NA 139 12/18/2015 1239   K 4.0 12/18/2015 1239   CL 104 11/27/2015 1608   CL 106 10/17/2012 0754   CO2 24 12/18/2015 1239   BUN 18.4 12/18/2015 1239   CREATININE 1.40 (H) 10/01/2016 1733   CREATININE 1.2 12/18/2015 1239      Component Value Date/Time   CALCIUM 9.3 12/18/2015 1239   ALKPHOS 43 12/18/2015 1239   AST 31 12/18/2015 1239   ALT 33 12/18/2015 1239   BILITOT 2.90 (H) 12/18/2015 1239     EXAM: CT NECK WITH CONTRAST  TECHNIQUE: Multidetector CT imaging of the neck was performed using the standard protocol following the bolus administration of intravenous contrast.  CONTRAST:  77mL ISOVUE-300 IOPAMIDOL (ISOVUE-300) INJECTION 61%  COMPARISON:  Carotid ultrasound September 07, 2015  FINDINGS: PHARYNX AND LARYNX: Slight medial deviation RIGHT true vocal cord, without corroborative findings of vocal cord paralysis. Pharynx and larynx are otherwise unremarkable.  SALIVARY GLANDS: Normal.  THYROID: Normal.  LYMPH NODES: No lymphadenopathy by CT size criteria.  VASCULAR: Calcific atherosclerosis of the carotid bifurcations with suspected hemodynamically significant stenosis LEFT internal carotid artery origin.  LIMITED INTRACRANIAL: Severe calcific atherosclerosis of the carotid siphons.  VISUALIZED ORBITS: Status post RIGHT ocular lens implant.  MASTOIDS AND VISUALIZED PARANASAL SINUSES: Trace maxillary sinus mucosal thickening without air-fluid levels. Mastoid air cells are well aerated.  SKELETON: Nonacute. Severe C5-6 and C6-7 degenerative discs. Lytic lesion LEFT C6 lamina. Multilevel severe neural foraminal narrowing.  UPPER CHEST: Lung apices are clear. 9 mm precarinal lymph node, 11 mm LEFT peritracheal lymph node. Status post median sternotomy. Mildly calcified aortic arch.  OTHER: 2 skin marker is a RIGHT  lower neck without underlying mass. RIGHT neck scarring.  However, borderline mediastinal lymphadenopathy.  IMPRESSION: No lymphadenopathy or specific findings to explain palpable abnormality. RIGHT neck scarring. Borderline mediastinal lymphadenopathy.  Lytic lesion LEFT C6 lamina without pathologic fracture, concerning for metastatic disease. Recommend MRI of the cervical spine with contrast.  Suspected hemodynamically significant stenosis LEFT internal carotid artery origin could be confirmed with CT angiogram of the neck as previously recommended.  IMPRESSION: The lytic lesion in the left C6 lamina is difficult to see by MRI but does not have aggressive characteristics. This Lesion does not enhance and is intermediate signal on T1. Review of the CT reveals of the margins are relatively sharp and this may be a benign bone lesion.    Impression and Plan:   83 year old gentleman with the following issues: 1. Stage IB melanoma diagnosed in August 2007, status post wide excision and a neck dissection.  He continues to have no evidence of any recurrent disease. He did have imaging studies including CT scan of the neck as well as MRI which I personally reviewed today and did not show any evidence of recurrent disease. I recommended continued  annual surveillance for the time being. He continues to follow with dermatology quarterly. 2. Thrombocytopenia.  Rather mild and stable without any evidence of bleeding.  Related to autoimmune etiology versus reactive findings. No intervention is needed with blood counts today actually improved from previous counts. 3. Followup will be in 12 months.    [facility], MD 6/14/201810:24 AM

## 2016-12-16 NOTE — Telephone Encounter (Signed)
Scheduled appt per 6/14 los - Gave patient AVS and calender per LOS - lab and f.u in one year

## 2016-12-21 DIAGNOSIS — D696 Thrombocytopenia, unspecified: Secondary | ICD-10-CM | POA: Diagnosis not present

## 2016-12-21 DIAGNOSIS — I10 Essential (primary) hypertension: Secondary | ICD-10-CM | POA: Diagnosis not present

## 2016-12-21 DIAGNOSIS — N401 Enlarged prostate with lower urinary tract symptoms: Secondary | ICD-10-CM | POA: Diagnosis not present

## 2016-12-21 DIAGNOSIS — I25119 Atherosclerotic heart disease of native coronary artery with unspecified angina pectoris: Secondary | ICD-10-CM | POA: Diagnosis not present

## 2017-03-16 ENCOUNTER — Other Ambulatory Visit: Payer: Self-pay | Admitting: Cardiology

## 2017-04-22 ENCOUNTER — Encounter: Payer: Self-pay | Admitting: Physician Assistant

## 2017-04-22 DIAGNOSIS — I10 Essential (primary) hypertension: Secondary | ICD-10-CM | POA: Diagnosis not present

## 2017-04-22 DIAGNOSIS — N401 Enlarged prostate with lower urinary tract symptoms: Secondary | ICD-10-CM | POA: Diagnosis not present

## 2017-04-22 DIAGNOSIS — Z23 Encounter for immunization: Secondary | ICD-10-CM | POA: Diagnosis not present

## 2017-04-22 DIAGNOSIS — D696 Thrombocytopenia, unspecified: Secondary | ICD-10-CM | POA: Diagnosis not present

## 2017-04-22 DIAGNOSIS — I25119 Atherosclerotic heart disease of native coronary artery with unspecified angina pectoris: Secondary | ICD-10-CM | POA: Diagnosis not present

## 2017-04-22 NOTE — Progress Notes (Addendum)
Cardiology Office Note    Date:  04/25/2017  ID:  John [name], DOB [id number], MRN [id number] PCP:  John Du, MD  Cardiologist: Dr. Domenic Polite   Chief Complaint: abnormal EKG  History of Present Illness:  John [name] is a 83 y.o. male with history of CAD s/p CABG 1992, STEMI 11/2015 s/p DES to LIMA-LAD insertion site (requiring Impella), 1st degree AV block, ischemic cardiomyopathy EF 40-45%, CKD stage III, HTN, hyperlipidemia, OSA, nephrolithasis, OSA, mildly dilated aortic root, chronic appearing thrombocytopenia who presents for evaluation of abnormal EKG. Last cath was at time of MI in 11/2015 with plan to continue aspirin and Plavix. Last echo 11/2015 at time of MI showed moderate focal basal hypertrophy of the septum, EF 40-45%, akinesis of inferolateral myocardium, hypokinesis of the inferior myocardium, grade 2 diastolic dysfunction, high ventricular filling pressure, mild AI, mildly dilated aortic root, moderately dilated left atrium. Last labs showed Hgb 16, plt 86 (12/2016), Na 145, K 3.5, Cr 1.29, glucose 107 (05/2016), bilirubin chronically elevated 2.9 (12/2015).   He returns for follow-up at the request of Dr. Luan Pulling. He saw Dr. Luan Pulling on 10/19 for routine follow-up at which time he noticed he's been having intermittent episodes of dyspnea for the last few months. His chart indicates h/o NYHA class II dyspnea back to 2016. Dr. Luan Pulling noted abnormal EKG at that visit and recommended he f/u here. The patient reports in general he is doing very well. He states he's able to do everything he needs to do generally without functional limitation. However, a few times a week, he gets short-lived episodes of dyspnea. They are usually quite brief, about 3 minutes or so. The longest episode was 15 minutes. They are not necessarily associated with exertion. Most of the time he's able to work out in the yard with a chain saw or riding mower and pick up bags of grass without difficulty.  Other times he will feel short of breath if walking across his home. He's not been really able to associate a pattern with it. He's not had any chest pain whatsoever. He is completely asymptomatic today. No nausea, vomiting, diaphoresis, LEE, orthopnea.  Past Medical History:  Diagnosis Date  . Acute ST elevation myocardial infarction (STEMI) of posterior wall ([place]) 11/11/2015  . CKD (chronic kidney disease), stage III ([place])   . Coronary atherosclerosis of native coronary artery    a. Multivessel status post CABG in 1992. b. STEMI 11/2015 s/p DES to insertion site of LIMA-LAD.  John [name] Dilated aortic root ([place])   . Essential hypertension, benign   . Ischemic cardiomyopathy   . Malignant melanoma of skin   . Mixed hyperlipidemia   . Nephrolithiasis   . OSA (obstructive sleep apnea)   . Rosacea   . Thrombocytopenia ([place])     Past Surgical History:  Procedure Laterality Date  . CARDIAC CATHETERIZATION  07/11/90  . CARDIAC CATHETERIZATION N/A 11/11/2015   Procedure: Left Heart Cath and Coronary Angiography;  Surgeon: [name], MD;  Location: [facility];  Service: Cardiovascular;  Laterality: N/A;  . CARDIAC CATHETERIZATION N/A 11/14/2015   Procedure: Coronary Stent Intervention w/Impella;  Surgeon: [name], MD;  Location: [facility];  Service: Cardiovascular;  Laterality: N/A;  . carotid duplex  06/24/2011, 05/12/2009   Right and Left ICAs: Demonstrates a small amount of irregular mixed density plaque with no evidence of diameter reduction, significant tortuosity or any other vascular abnormality. This is a mildly abnormal carotid duplex doppler evaluation.  John [name]  CATARACT EXTRACTION W/PHACO Right 02/11/2014   Procedure: CATARACT EXTRACTION PHACO AND INTRAOCULAR LENS PLACEMENT (IOC);  Surgeon: [name], MD;  Location: [facility];  Service: Ophthalmology;  Laterality: Right;  CDE 13.83  . CHOLECYSTECTOMY    . CORONARY ARTERY BYPASS GRAFT  1992  . DOPPLER ECHOCARDIOGRAPHY  05/23/2007    Left ventricular systolic function is normal. The transmitral spectral doppler flow pattern is suggestive of impaired LV relaxation. No significant valvular abnormalities.  . heart monitor  04/01/2009   palpitations  . HERNIA REPAIR    . MELANOMA EXCISION     neck  . NM MYOCAR PERF EJECTION FRACTION  02/10/2011   The post stress myocaardial perfusion images show a normal pattern of perfusion in all regions. No significant wall motion abnormalities noted. EKG is negative for ischemia. This is a low risk scan.  John [name] TOTAL KNEE ARTHROPLASTY      Current Medications: Current Meds  Medication Sig  . acetaminophen (TYLENOL) 325 MG tablet Take 2 tablets (650 mg total) by mouth every 4 (four) hours as needed for headache or mild pain.  John [name] amLODipine (NORVASC) 5 MG tablet Take 1 tablet (5 mg total) by mouth daily.  John [name] aspirin EC 81 MG tablet Take 81 mg by mouth daily.  . clopidogrel (PLAVIX) 75 MG tablet TAKE ONE TABLET BY MOUTH ONCE DAILY WITH BREAKFAST  . furosemide (LASIX) 20 MG tablet TAKE 1 TABLET BY MOUTH EVERY OTHER DAY  . metoprolol succinate (TOPROL-XL) 25 MG 24 hr tablet TAKE ONE TABLET BY MOUTH TWICE DAILY  . nitroGLYCERIN (NITROSTAT) 0.4 MG SL tablet Place 1 tablet (0.4 mg total) under the tongue every 5 (five) minutes x 3 doses as needed for chest pain.  . pantoprazole (PROTONIX) 40 MG tablet Take 40 mg by mouth daily.  . [DISCONTINUED] famotidine (PEPCID) 10 MG tablet Take 10 mg by mouth daily as needed for heartburn or indigestion.     Allergies:   Statins and Tape   Social History   Social History  . Marital status: Married    Spouse name: N/A  . Number of children: N/A  . Years of education: N/A   Social History Main Topics  . Smoking status: Former Smoker    Types: Cigarettes    Start date: 07/06/1943    Quit date: 07/05/1981  . Smokeless tobacco: Never Used  . Alcohol use No  . Drug use: No  . Sexual activity: Yes    Partners: Female   Other Topics Concern  . None    Social History Narrative  . None     Family History:  Family History  Problem Relation Age of Onset  . Heart attack Father   . Skin cancer Brother   . Heart attack Brother      ROS:   Please see the history of present illness.  All other systems are reviewed and otherwise negative.    PHYSICAL EXAM:   VS:  BP (!) 146/78   Pulse 72   Ht 5\' 11"  (1.803 m)   Wt 197 lb (89.4 kg)   SpO2 96%   BMI 27.48 kg/m   BMI: Body mass index is 27.48 kg/m. GEN: Well nourished, well developed friendly joyful WM, in no acute distress  HEENT: normocephalic, atraumatic Neck: no JVD, carotid bruits, or masses Cardiac: RRR; no murmurs, rubs, or gallops, no edema  Respiratory:  clear to auscultation bilaterally, normal work of breathing GI: soft, nontender, nondistended, + BS MS: no deformity or atrophy  Skin:  warm and dry, no rash Neuro:  Alert and Oriented x 3, Strength and sensation are intact, follows commands, hard of hearing, hearing aid L ear Psych: euthymic mood, full affect  Wt Readings from Last 3 Encounters:  04/25/17 197 lb (89.4 kg)  12/16/16 201 lb 1.6 oz (91.2 kg)  12/01/16 202 lb (91.6 kg)      Studies/Labs Reviewed:   EKG:  EKG was ordered today and personally reviewed by me and demonstrates NSR 78bpm 1st degree AVB occasional PVCs, nonspecific QRS widening with downsloped TWI V2-V5, TWI I, avL, LVH type voltage. No inferior ST elevation. (EKG at Dr. Luan Pulling' office is similar.)  Compared to 12/01/16, the TWI in I and avL is similar but had generalized flattening of TW in V3-V5 rather than contiguous inversion at that time.  See below for comparison to 2017.  Recent Labs: 10/01/2016: Creatinine, Ser 1.40 12/16/2016: HGB 16.0; Platelets 86   Lipid Panel    Component Value Date/Time   CHOL 170 11/12/2015 0432   TRIG 207 (H) 11/12/2015 0432   HDL 28 (L) 11/12/2015 0432   CHOLHDL 6.1 11/12/2015 0432   VLDL 41 (H) 11/12/2015 0432   LDLCALC 101 (H) 11/12/2015 0432     Additional studies/ records that were reviewed today include: Summarized above.    ASSESSMENT & PLAN:   1. CAD/Abnormal EKG/shortness of breath  - reviewed EKGs extensively. I see the changes Dr. Luan Pulling is referring to. The patient has had varying degrees of this in the past year. His EKG from 11/14/15 (post-PCI during admission for MI) looked similar to 12/01/16 (this year) with improvement in his anterior changes. However, interestingly the EKG from 11/15/15 (the last one during his STEMI admission) looks similar to the one he has today. I wonder if his LVH is contributing to these deflections. He does have chronic dyspnea which seems to wax and wane, possibly slightly more frequent for the last few months. I discussed in depth with Dr. Bronson Ing (doc of day) who also reviewed tracings with me. At this time he recommends to titrate medical therapy for CAD and follow. Will add Imdur 30mg  daily and follow for clinical response. Will bring him back in in 2 weeks for repeat EKG and visit on a day when Dr. Domenic Polite is in the office. Will also check labs including CBC and BNP to exclude alternative causes. He also reports longstanding history of occasional "skip" with occasional PVCs noted on his tracing. F/u lytes and thyroid. Warning sx reviewed extensively with patient. 2. Ischemic cardiomyopathy - does not appear overtly volume overloaded but will f/u BNP level to trend. 3. Essential HTN - follow BP with addition of isosorbide. 4. Mildly dilated aortic root - further monitoring will be at the discretion of his primary cardiologist.  Disposition: F/u with Dr. Domenic Polite or APP on a day when he is in clinic in 2 weeks with repeat EKG at that time. Will also forward to Dr. Domenic Polite for review.   Medication Adjustments/Labs and Tests Ordered: Current medicines are reviewed at length with the patient today.  Concerns regarding medicines are outlined above. Medication changes, Labs and Tests ordered  today are summarized above and listed in the Patient Instructions accessible in Encounters.   Signed, Charlie Pitter, PA-C  04/25/2017 3:21 PM    [facility] in [place]. [place], [place] [address] Ph: [phone]; Fax [phone]

## 2017-04-25 ENCOUNTER — Ambulatory Visit (INDEPENDENT_AMBULATORY_CARE_PROVIDER_SITE_OTHER): Payer: Medicare Other | Admitting: Physician Assistant

## 2017-04-25 ENCOUNTER — Encounter: Payer: Self-pay | Admitting: Physician Assistant

## 2017-04-25 ENCOUNTER — Other Ambulatory Visit (HOSPITAL_COMMUNITY)
Admission: RE | Admit: 2017-04-25 | Discharge: 2017-04-25 | Disposition: A | Discharge status: Home / Self Care | Payer: Medicare Other | Source: Ambulatory Visit | Attending: Physician Assistant | Admitting: Physician Assistant

## 2017-04-25 VITALS — BP 146/78 | HR 72 | Ht 71.0 in | Wt 197.0 lb

## 2017-04-25 DIAGNOSIS — I2510 Atherosclerotic heart disease of native coronary artery without angina pectoris: Secondary | ICD-10-CM

## 2017-04-25 DIAGNOSIS — R9431 Abnormal electrocardiogram [ECG] [EKG]: Secondary | ICD-10-CM | POA: Diagnosis not present

## 2017-04-25 DIAGNOSIS — R0602 Shortness of breath: Secondary | ICD-10-CM | POA: Insufficient documentation

## 2017-04-25 DIAGNOSIS — I10 Essential (primary) hypertension: Secondary | ICD-10-CM

## 2017-04-25 DIAGNOSIS — I255 Ischemic cardiomyopathy: Secondary | ICD-10-CM | POA: Diagnosis not present

## 2017-04-25 DIAGNOSIS — I77810 Thoracic aortic ectasia: Secondary | ICD-10-CM

## 2017-04-25 LAB — CBC WITH DIFFERENTIAL/PLATELET
Basophils Absolute: 0 10*3/uL (ref 0.0–0.1)
Basophils Relative: 0 %
Eosinophils Absolute: 0.2 10*3/uL (ref 0.0–0.7)
Eosinophils Relative: 3 %
HCT: 46.5 % (ref 39.0–52.0)
Hemoglobin: 16.1 g/dL (ref 13.0–17.0)
Lymphocytes Relative: 26 %
Lymphs Abs: 1.3 10*3/uL (ref 0.7–4.0)
MCH: 32 pg (ref 26.0–34.0)
MCHC: 34.6 g/dL (ref 30.0–36.0)
MCV: 92.4 fL (ref 78.0–100.0)
Monocytes Absolute: 0.3 10*3/uL (ref 0.1–1.0)
Monocytes Relative: 5 %
Neutro Abs: 3.3 10*3/uL (ref 1.7–7.7)
Neutrophils Relative %: 65 %
Platelets: 82 10*3/uL — ABNORMAL LOW (ref 150–400)
RBC: 5.03 MIL/uL (ref 4.22–5.81)
RDW: 13.4 % (ref 11.5–15.5)
WBC: 5.1 10*3/uL (ref 4.0–10.5)

## 2017-04-25 LAB — BASIC METABOLIC PANEL
Anion gap: 7 (ref 5–15)
BUN: 18 mg/dL (ref 6–20)
CO2: 29 mmol/L (ref 22–32)
Calcium: 9.6 mg/dL (ref 8.9–10.3)
Chloride: 104 mmol/L (ref 101–111)
Creatinine, Ser: 1.34 mg/dL — ABNORMAL HIGH (ref 0.61–1.24)
GFR calc Af Amer: 54 mL/min — ABNORMAL LOW (ref >60)
GFR calc non Af Amer: 47 mL/min — ABNORMAL LOW (ref >60)
Glucose, Bld: 113 mg/dL — ABNORMAL HIGH (ref 65–99)
Potassium: 3.9 mmol/L (ref 3.5–5.1)
Sodium: 140 mmol/L (ref 135–145)

## 2017-04-25 LAB — BRAIN NATRIURETIC PEPTIDE: B Natriuretic Peptide: 660 pg/mL — ABNORMAL HIGH (ref 0.0–100.0)

## 2017-04-25 MED ORDER — ISOSORBIDE MONONITRATE ER 30 MG PO TB24: 30.0000 mg | ORAL_TABLET | Freq: Every day | ORAL | 3 refills | Status: DC

## 2017-04-25 NOTE — Patient Instructions (Signed)
Medication Instructions:  Your physician has recommended you make the following change in your medication:  Imdur 30 mg Daily    Labwork: Your physician recommends that you return for lab work in: Today    Testing/Procedures: NONE   Follow-Up: Your physician recommends that you schedule a follow-up appointment in: 2 Weeks    Any Other Special Instructions Will Be Listed Below (If Applicable).     If you need a refill on your cardiac medications before your next appointment, please call your pharmacy.  Thank you for choosing [facility]!

## 2017-04-28 DIAGNOSIS — D1801 Hemangioma of skin and subcutaneous tissue: Secondary | ICD-10-CM | POA: Diagnosis not present

## 2017-04-28 DIAGNOSIS — L570 Actinic keratosis: Secondary | ICD-10-CM | POA: Diagnosis not present

## 2017-04-28 DIAGNOSIS — L821 Other seborrheic keratosis: Secondary | ICD-10-CM | POA: Diagnosis not present

## 2017-04-28 DIAGNOSIS — D225 Melanocytic nevi of trunk: Secondary | ICD-10-CM | POA: Diagnosis not present

## 2017-05-09 ENCOUNTER — Other Ambulatory Visit: Payer: Self-pay | Admitting: Cardiology

## 2017-05-09 NOTE — Progress Notes (Signed)
Cardiology Office Note   Date:  05/10/2017   ID:  ZACKARIAH VANDERPOL, DOB [birth year], MRN [id number]  PCP:  Sinda Du, MD  Cardiologist: [name], MD  Chief Complaint  Patient presents with  . Coronary Artery Disease  . Cardiomyopathy  . Hyperlipidemia      History of Present Illness: John [name] is a 83 y.o. male who presents for ongoing assessment and management of coronary artery disease, history of coronary artery bypass grafting in 1992, drug-eluting stent to LIMA to LAD insertion site 11/2015, history of ischemic cardiomyopathy with EF of 40% to 45%, hyperlipidemia, hypertension, with other medical problems to include chronic kidney disease stage III, obstructive sleep apnea, chronic thrombocytopenia.  The patient was last seen in the office by on 04/25/2017, by Melina Copa, PA, with complaints of intermittent episodes of dyspnea over the last several months.  The patient has New York Heart Association class II dyspnea at baseline.  His dyspnea has worsened especially with exertion.  Due to history of coronary artery disease with abnormal EKG and worsening shortness of breath, the patient was to be treated medically with addition of Imdurr 30 mg daily.  A CBC and a BMET were also ordered.  He continued to have symptoms further testing would be considered.  Repeat BMET revealed stable renal insufficiency.  He did have elevated BNP of 660.  At which time Mrs. Dunn requested that he began to take Lasix daily for 4 days and then go back to every other day.  Advised on a low-sodium diet.  Labs: Sodium 140, potassium 2.9, chloride 104, CO2 29, glucose 113, creatinine 1.34.           Hemoglobin 16.1 hematocrit 46.5, white blood cells 5.1, platelets 82.  He comes today feeling much better with use of nitrates.  He does have some complaints of headache after taking isosorbide, but only lasts a few hours and subsides on its own.  He states he is able to tolerate it.  States his  breathing status is gotten better.  His weight is eventually the same.  He is avoiding salt.  Past Medical History:  Diagnosis Date  . Acute ST elevation myocardial infarction (STEMI) of posterior wall ([place]) 11/11/2015  . CKD (chronic kidney disease), stage III ([place])   . Coronary atherosclerosis of native coronary artery    a. Multivessel status post CABG in 1992. b. STEMI 11/2015 s/p DES to insertion site of LIMA-LAD.  [name] Dilated aortic root ([place])   . Essential hypertension, benign   . Ischemic cardiomyopathy   . Malignant melanoma of skin   . Mixed hyperlipidemia   . Nephrolithiasis   . OSA (obstructive sleep apnea)   . Rosacea   . Thrombocytopenia ([place])     Past Surgical History:  Procedure Laterality Date  . CARDIAC CATHETERIZATION  07/11/90  . carotid duplex  06/24/2011, 05/12/2009   Right and Left ICAs: Demonstrates a small amount of irregular mixed density plaque with no evidence of diameter reduction, significant tortuosity or any other vascular abnormality. This is a mildly abnormal carotid duplex doppler evaluation.  . CHOLECYSTECTOMY    . CORONARY ARTERY BYPASS GRAFT  1992  . DOPPLER ECHOCARDIOGRAPHY  05/23/2007   Left ventricular systolic function is normal. The transmitral spectral doppler flow pattern is suggestive of impaired LV relaxation. No significant valvular abnormalities.  . heart monitor  04/01/2009   palpitations  . HERNIA REPAIR    . MELANOMA EXCISION     neck  .  NM MYOCAR PERF EJECTION FRACTION  02/10/2011   The post stress myocaardial perfusion images show a normal pattern of perfusion in all regions. No significant wall motion abnormalities noted. EKG is negative for ischemia. This is a low risk scan.  [name] TOTAL KNEE ARTHROPLASTY       Current Outpatient Medications  Medication Sig Dispense Refill  . acetaminophen (TYLENOL) 325 MG tablet Take 2 tablets (650 mg total) by mouth every 4 (four) hours as needed for headache or mild pain.    [name] amLODipine (NORVASC)  5 MG tablet Take 1 tablet (5 mg total) by mouth daily. 180 tablet 3  . aspirin EC 81 MG tablet Take 81 mg by mouth daily.    . clopidogrel (PLAVIX) 75 MG tablet TAKE 1 TABLET BY MOUTH ONCE DAILY WITH BREAKFAST 90 tablet 2  . furosemide (LASIX) 20 MG tablet TAKE 1 TABLET BY MOUTH EVERY OTHER DAY 45 tablet 3  . isosorbide mononitrate (IMDUR) 30 MG 24 hr tablet Take 1 tablet (30 mg total) by mouth daily. 90 tablet 3  . losartan (COZAAR) 25 MG tablet TAKE ONE TABLET BY MOUTH ONCE DAILY 180 tablet 3  . metoprolol succinate (TOPROL-XL) 25 MG 24 hr tablet TAKE ONE TABLET BY MOUTH TWICE DAILY 60 tablet 11  . nitroGLYCERIN (NITROSTAT) 0.4 MG SL tablet Place 1 tablet (0.4 mg total) under the tongue every 5 (five) minutes x 3 doses as needed for chest pain. 25 tablet 2  . pantoprazole (PROTONIX) 40 MG tablet Take 40 mg by mouth daily.     No current facility-administered medications for this visit.     Allergies:   Statins and Tape    Social History:  The patient  reports that he quit smoking about 35 years ago. His smoking use included cigarettes. He started smoking about 73 years ago. he has never used smokeless tobacco. He reports that he does not drink alcohol or use drugs.   Family History:  The patient's family history includes Heart attack in his brother and father; Skin cancer in his brother.    ROS: All other systems are reviewed and negative. Unless otherwise mentioned in H&P    PHYSICAL EXAM: VS:  BP (!) 146/88   Pulse 69   Wt 196 lb (88.9 kg)   SpO2 97%   BMI 27.34 kg/m  , BMI Body mass index is 27.34 kg/m. GEN: Well nourished, well developed, in no acute distress  HEENT: normal  Neck: no JVD, carotid bruits, or masses Cardiac: RRR; distant heart sounds, soft systolic murmurs, rubs, or gallops,no edema  Respiratory:  Clear to auscultation bilaterally, normal work of breathing GI: soft, nontender, nondistended, + BS MS: no deformity or atrophy  Skin: warm and dry, no  rash Neuro:  Strength and sensation are intact he does have some intermittent slurred speech. Psych: euthymic mood, full affect  Recent Labs: 04/25/2017: B Natriuretic Peptide 660.0; BUN 18; Creatinine, Ser 1.34; Hemoglobin 16.1; Platelets 82; Potassium 3.9; Sodium 140    Lipid Panel    Component Value Date/Time   CHOL 170 11/12/2015 0432   TRIG 207 (H) 11/12/2015 0432   HDL 28 (L) 11/12/2015 0432   CHOLHDL 6.1 11/12/2015 0432   VLDL 41 (H) 11/12/2015 0432   LDLCALC 101 (H) 11/12/2015 0432      Wt Readings from Last 3 Encounters:  05/10/17 196 lb (88.9 kg)  04/25/17 197 lb (89.4 kg)  12/16/16 201 lb 1.6 oz (91.2 kg)      Other studies  Reviewed: Echocardiogram 11/13/2015 Left ventricle: The cavity size was normal. There was moderate   focal basal hypertrophy of the septum. Systolic function was   mildly to moderately reduced. The estimated ejection fraction was   in the range of 40% to 45%. There is akinesis of the   inferolateral myocardium. There is hypokinesis of the inferior   myocardium. Features are consistent with a pseudonormal left   ventricular filling pattern, with concomitant abnormal relaxation   and increased filling pressure (grade 2 diastolic dysfunction).   Doppler parameters are consistent with high ventricular filling   pressure. - Aortic valve: There was mild regurgitation. - Aortic root: The aortic root was mildly dilated. - Left atrium: The atrium was moderately dilated.  Cardiac Cath 11/14/2015  Sequential SVG .  The first limb of the sequential SVG-diagonal/ramus intermedius is patent, but the second limb is occluded.  Origin lesion, before 1st Diag, 40% stenosed.  Prox RCA to Mid RCA lesion, 100% stenosed.  LIMA .  The LIMA is patent, but there is a tight lesion just beyond the insertion site in the mid-LAD  Ost LM lesion, 100% stenosed.  Ost Cx lesion, 100% stenosed.  Mid LAD to Dist LAD lesion, 90% stenosed. Post intervention, there  is a 0% residual stenosis.   Severe stenosis of the mid LAD at the LIMA insertion site, treated successfully with PCI using a single drug-eluting stent and hemodynamic support with an Impella CP device.     ASSESSMENT AND PLAN:  1.  Coronary artery disease: Most recent cardiac catheterization as above in May 2017.  He had a stent placed to the mid LAD and distal LAD due to 90% stenosis.  Note he did have a tight lesion just beyond the insertion site of the LIMA to mid LAD.  With introduction of nitrates, the patient's symptoms have subsided.  He is watching his salt.  He is medically compliant.  We will not plan any more cardiac testing at this time unless his symptoms recur.  Continue dual antiplatelet therapy with aspirin and Plavix.  2.  Hypertension: Blood pressure is moderately controlled.  Compared to prior office visit it is essentially the same.  Review of labs as above.  No changes in medical therapy.  3.  Hyperlipidemia: Not on statin therapy.  Labs are followed by primary care physician Dr. Luan Pulling.  See no documentation of allergies or abnormal labs which would prohibit statin therapy.  The patient was on atorvastatin post cardiac catheterization 1 year ago.  This will need to be restarted.   4.  Chronic kidney disease: Review of labs reveals a creatinine of 1.34.  Continue to follow especially in light of ARB therapy.   Current medicines are reviewed at length with the patient today.    Labs/ tests ordered today include:  Phill Myron. West Pugh, ANP, AACC   05/10/2017 1:48 PM    [facility] [address], [place], [place] [address] Phone: [phone]; Fax: [phone]

## 2017-05-10 ENCOUNTER — Encounter: Payer: Self-pay | Admitting: Adult Health

## 2017-05-10 ENCOUNTER — Ambulatory Visit: Payer: Medicare Other | Admitting: Adult Health

## 2017-05-10 VITALS — BP 146/88 | HR 69 | Wt 196.0 lb

## 2017-05-10 DIAGNOSIS — I2510 Atherosclerotic heart disease of native coronary artery without angina pectoris: Secondary | ICD-10-CM

## 2017-05-10 DIAGNOSIS — I10 Essential (primary) hypertension: Secondary | ICD-10-CM

## 2017-05-10 DIAGNOSIS — E7800 Pure hypercholesterolemia, unspecified: Secondary | ICD-10-CM

## 2017-05-10 DIAGNOSIS — I130 Hypertensive heart and chronic kidney disease with heart failure and stage 1 through stage 4 chronic kidney disease, or unspecified chronic kidney disease: Secondary | ICD-10-CM | POA: Diagnosis not present

## 2017-05-10 NOTE — Patient Instructions (Signed)
Medication Instructions:  Your physician recommends that you continue on your current medications as directed. Please refer to the Current Medication list given to you today.   Labwork: NONE   Testing/Procedures: NONE   Follow-Up: Your physician wants you to follow-up in: 6 Months. You will receive a reminder letter in the mail two months in advance. If you don't receive a letter, please call our office to schedule the follow-up appointment.   Any Other Special Instructions Will Be Listed Below (If Applicable).     If you need a refill on your cardiac medications before your next appointment, please call your pharmacy.  Thank you for choosing [facility]!   

## 2017-06-03 ENCOUNTER — Ambulatory Visit: Payer: Medicare Other | Admitting: Cardiology

## 2017-06-07 DIAGNOSIS — H524 Presbyopia: Secondary | ICD-10-CM | POA: Diagnosis not present

## 2017-07-29 DIAGNOSIS — I10 Essential (primary) hypertension: Secondary | ICD-10-CM | POA: Diagnosis not present

## 2017-07-29 DIAGNOSIS — D696 Thrombocytopenia, unspecified: Secondary | ICD-10-CM | POA: Diagnosis not present

## 2017-07-29 DIAGNOSIS — N401 Enlarged prostate with lower urinary tract symptoms: Secondary | ICD-10-CM | POA: Diagnosis not present

## 2017-07-29 DIAGNOSIS — I25119 Atherosclerotic heart disease of native coronary artery with unspecified angina pectoris: Secondary | ICD-10-CM | POA: Diagnosis not present

## 2017-10-14 DIAGNOSIS — I25119 Atherosclerotic heart disease of native coronary artery with unspecified angina pectoris: Secondary | ICD-10-CM | POA: Diagnosis not present

## 2017-10-14 DIAGNOSIS — I10 Essential (primary) hypertension: Secondary | ICD-10-CM | POA: Diagnosis not present

## 2017-10-14 DIAGNOSIS — R490 Dysphonia: Secondary | ICD-10-CM | POA: Diagnosis not present

## 2017-10-14 DIAGNOSIS — M1990 Unspecified osteoarthritis, unspecified site: Secondary | ICD-10-CM | POA: Diagnosis not present

## 2017-10-27 DIAGNOSIS — L218 Other seborrheic dermatitis: Secondary | ICD-10-CM | POA: Diagnosis not present

## 2017-10-27 DIAGNOSIS — L570 Actinic keratosis: Secondary | ICD-10-CM | POA: Diagnosis not present

## 2017-11-12 ENCOUNTER — Other Ambulatory Visit: Payer: Self-pay | Admitting: Cardiology

## 2017-11-23 NOTE — Progress Notes (Signed)
Cardiology Office Note  Date: 11/24/2017   ID: John [name], DOB [birth year], MRN [id number]  PCP: Sinda Du, MD  Primary Cardiologist: [name], MD   Chief Complaint  Patient presents with  . Coronary Artery Disease    History of Present Illness: John [name] is an 84 y.o. male last seen by Ms. West Pugh in November 2018.  He is here for a routine follow-up visit.  Since last assessment he does not report any progressive angina symptoms or nitroglycerin use.  He has slowed down over the years, but still gets outside, takes care of his property, cuts grass with a tractor.  He reports NYHA class II-III dyspnea depending on level of activity.  He has had no palpitations or syncope.  I reviewed his medications.  Cardiac regimen includes aspirin, Plavix, Lasix, Toprol-XL, and as needed nitroglycerin.  He has a history of statin intolerance.  States that he is due to see Dr. Luan Pulling in a few weeks for physical with lab work.   Past Medical History:  Diagnosis Date  . Acute ST elevation myocardial infarction (STEMI) of posterior wall ([place]) 11/11/2015  . CKD (chronic kidney disease), stage III ([place])   . Coronary atherosclerosis of native coronary artery    a. Multivessel status post CABG in 1992. b. STEMI 11/2015 s/p DES to insertion site of LIMA-LAD.  [name] Dilated aortic root ([place])   . Essential hypertension, benign   . Ischemic cardiomyopathy   . Malignant melanoma of skin   . Mixed hyperlipidemia   . Nephrolithiasis   . OSA (obstructive sleep apnea)   . Rosacea   . Thrombocytopenia ([place])     Past Surgical History:  Procedure Laterality Date  . CARDIAC CATHETERIZATION  07/11/90  . CARDIAC CATHETERIZATION N/A 11/11/2015   Procedure: Left Heart Cath and Coronary Angiography;  Surgeon: [name], MD;  Location: [facility];  Service: Cardiovascular;  Laterality: N/A;  . CARDIAC CATHETERIZATION N/A 11/14/2015   Procedure: Coronary Stent Intervention  w/Impella;  Surgeon: [name], MD;  Location: [facility];  Service: Cardiovascular;  Laterality: N/A;  . carotid duplex  06/24/2011, 05/12/2009   Right and Left ICAs: Demonstrates a small amount of irregular mixed density plaque with no evidence of diameter reduction, significant tortuosity or any other vascular abnormality. This is a mildly abnormal carotid duplex doppler evaluation.  [name] CATARACT EXTRACTION W/PHACO Right 02/11/2014   Procedure: CATARACT EXTRACTION PHACO AND INTRAOCULAR LENS PLACEMENT (IOC);  Surgeon: [name], MD;  Location: [facility];  Service: Ophthalmology;  Laterality: Right;  CDE 13.83  . CHOLECYSTECTOMY    . CORONARY ARTERY BYPASS GRAFT  1992  . DOPPLER ECHOCARDIOGRAPHY  05/23/2007   Left ventricular systolic function is normal. The transmitral spectral doppler flow pattern is suggestive of impaired LV relaxation. No significant valvular abnormalities.  . heart monitor  04/01/2009   palpitations  . HERNIA REPAIR    . MELANOMA EXCISION     neck  . NM MYOCAR PERF EJECTION FRACTION  02/10/2011   The post stress myocaardial perfusion images show a normal pattern of perfusion in all regions. No significant wall motion abnormalities noted. EKG is negative for ischemia. This is a low risk scan.  [name] TOTAL KNEE ARTHROPLASTY      Current Outpatient Medications  Medication Sig Dispense Refill  . acetaminophen (TYLENOL) 325 MG tablet Take 2 tablets (650 mg total) by mouth every 4 (four) hours as needed for headache or mild pain.    [name]  aspirin EC 81 MG tablet Take 81 mg by mouth daily.    . clopidogrel (PLAVIX) 75 MG tablet TAKE 1 TABLET BY MOUTH ONCE DAILY WITH BREAKFAST 90 tablet 2  . furosemide (LASIX) 20 MG tablet TAKE 1 TABLET BY MOUTH EVERY OTHER DAY 45 tablet 3  . metoprolol succinate (TOPROL-XL) 25 MG 24 hr tablet TAKE 1 TABLET BY MOUTH TWICE DAILY 60 tablet 5  . nitroGLYCERIN (NITROSTAT) 0.4 MG SL tablet Place 1 tablet (0.4 mg total) under the tongue every 5 (five)  minutes x 3 doses as needed for chest pain. 25 tablet 2  . pantoprazole (PROTONIX) 40 MG tablet Take 40 mg by mouth daily.     No current facility-administered medications for this visit.    Allergies:  Statins and Tape   Social History: The patient  reports that he quit smoking about 36 years ago. His smoking use included cigarettes. He started smoking about 74 years ago. He has never used smokeless tobacco. He reports that he does not drink alcohol or use drugs.   ROS:  Please see the history of present illness. Otherwise, complete review of systems is positive for hearing loss.  All other systems are reviewed and negative.   Physical Exam: VS:  BP 136/90   Pulse 61   Ht 5\' 6"  (1.676 m)   Wt 196 lb 6.4 oz (89.1 kg)   SpO2 98%   BMI 31.70 kg/m , BMI Body mass index is 31.7 kg/m.  Wt Readings from Last 3 Encounters:  11/24/17 196 lb 6.4 oz (89.1 kg)  05/10/17 196 lb (88.9 kg)  04/25/17 197 lb (89.4 kg)    General: Elderly male, appears comfortable at rest. HEENT: Conjunctiva and lids normal, oropharynx clear. Neck: Supple, no elevated JVP or carotid bruits, no thyromegaly. Lungs: Clear to auscultation, nonlabored breathing at rest. Cardiac: Regular rate and rhythm, no S3, soft systolic murmur. Abdomen: Soft, nontender, bowel sounds present. Extremities: Mild lower leg/ankle edema, distal pulses 2+. Skin: Warm and dry. Musculoskeletal: No kyphosis. Neuropsychiatric: Alert and oriented x3, affect grossly appropriate.  Hearing aids in place.  ECG: I personally reviewed the tracing from 04/25/2017 which showed sinus rhythm with prolonged PR interval, IVCD with repolarization abnormalities, PVCs.  Recent Labwork: 04/25/2017: B Natriuretic Peptide 660.0; BUN 18; Creatinine, Ser 1.34; Hemoglobin 16.1; Platelets 82; Potassium 3.9; Sodium 140   Other Studies Reviewed Today:  Cardiac catheterization and PCI 11/14/2015:  Sequential SVG .  The first limb of the sequential  SVG-diagonal/ramus intermedius is patent, but the second limb is occluded.  Origin lesion, before 1st Diag, 40% stenosed.  Prox RCA to Mid RCA lesion, 100% stenosed.  LIMA .  The LIMA is patent, but there is a tight lesion just beyond the insertion site in the mid-LAD  Ost LM lesion, 100% stenosed.  Ost Cx lesion, 100% stenosed.  Mid LAD to Dist LAD lesion, 90% stenosed. Post intervention, there is a 0% residual stenosis.   Severe stenosis of the mid LAD at the LIMA insertion site, treated successfully with PCI using a single drug-eluting stent and hemodynamic support with an Impella CP device.  Echocardiogram 11/13/2015: Study Conclusions  - Left ventricle: The cavity size was normal. There was moderate   focal basal hypertrophy of the septum. Systolic function was   mildly to moderately reduced. The estimated ejection fraction was   in the range of 40% to 45%. There is akinesis of the   inferolateral myocardium. There is hypokinesis of the inferior  myocardium. Features are consistent with a pseudonormal left   ventricular filling pattern, with concomitant abnormal relaxation   and increased filling pressure (grade 2 diastolic dysfunction).   Doppler parameters are consistent with high ventricular filling   pressure. - Aortic valve: There was mild regurgitation. - Aortic root: The aortic root was mildly dilated. - Left atrium: The atrium was moderately dilated.  Impressions:  - Akinesis of the inferolateral wall and hypokinesis of the   inferior wall; overall mild to moderate LV dysfunction; grade 2   diastolic dysfunction with elevated LV filling pressure; mild AI;   moderate LAE; mildly dilated aortic root (43 mm); suggest CTA or   MRA to further assess.  Assessment and Plan:  1.  Multivessel CAD status post CABG in 1992 with DES to the LAD at LIMA insertion site in May 2017.  He does not report any progressive angina at this time on medical therapy and we will  continue with observation.  We are maintaining long-term dual antiplatelet therapy.  2.  Essential hypertension, currently on Toprol-XL with follow-up per Dr. Luan Pulling.  3.  Ischemic cardiomyopathy with LVEF 40 to 45%.  He is on Lasix with stable weight, no orthopnea.  Has not been on ARB or ACE inhibitor with fluctuating renal insufficiency.  4.  CKD stage III, continues to follow with Dr. Luan Pulling.  5.  Mixed hyperlipidemia with statin intolerance.  Current medicines were reviewed with the patient today.  Disposition: Follow-up in 6 months.  Signed, [name], MD, [facility] 11/24/2017 10:20 AM    [facility] [address], [place], [place] [address] Phone: [phone]; Fax: [phone]

## 2017-11-24 ENCOUNTER — Encounter: Payer: Self-pay | Admitting: Cardiology

## 2017-11-24 ENCOUNTER — Ambulatory Visit: Payer: Medicare Other | Admitting: Cardiology

## 2017-11-24 VITALS — BP 136/90 | HR 61 | Ht 66.0 in | Wt 196.4 lb

## 2017-11-24 DIAGNOSIS — E782 Mixed hyperlipidemia: Secondary | ICD-10-CM

## 2017-11-24 DIAGNOSIS — I255 Ischemic cardiomyopathy: Secondary | ICD-10-CM | POA: Diagnosis not present

## 2017-11-24 DIAGNOSIS — I10 Essential (primary) hypertension: Secondary | ICD-10-CM

## 2017-11-24 DIAGNOSIS — N183 Chronic kidney disease, stage 3 unspecified: Secondary | ICD-10-CM

## 2017-11-24 DIAGNOSIS — I25119 Atherosclerotic heart disease of native coronary artery with unspecified angina pectoris: Secondary | ICD-10-CM | POA: Diagnosis not present

## 2017-11-24 NOTE — Patient Instructions (Addendum)
Your physician wants you to follow-up in: 6 months Dr Ferne Reus will receive a reminder letter in the mail two months in advance. If you don't receive a letter, please call our office to schedule the follow-up appointment.  Your physician recommends that you continue on your current medications as directed. Please refer to the Current Medication list given to you today.   If you need a refill on your cardiac medications before your next appointment, please call your pharmacy.    No lab work or tests ordered today.     Thank you for choosing [facility] !

## 2017-12-15 ENCOUNTER — Inpatient Hospital Stay: Payer: Medicare Other | Attending: Oncology | Admitting: Oncology

## 2017-12-15 ENCOUNTER — Telehealth: Payer: Self-pay | Admitting: Oncology

## 2017-12-15 ENCOUNTER — Inpatient Hospital Stay: Payer: Medicare Other

## 2017-12-15 VITALS — BP 179/87 | HR 59 | Temp 97.7°F | Resp 20 | Ht 66.0 in | Wt 194.6 lb

## 2017-12-15 DIAGNOSIS — Z7902 Long term (current) use of antithrombotics/antiplatelets: Secondary | ICD-10-CM | POA: Diagnosis not present

## 2017-12-15 DIAGNOSIS — Z79899 Other long term (current) drug therapy: Secondary | ICD-10-CM | POA: Diagnosis not present

## 2017-12-15 DIAGNOSIS — D696 Thrombocytopenia, unspecified: Secondary | ICD-10-CM | POA: Insufficient documentation

## 2017-12-15 DIAGNOSIS — C439 Malignant melanoma of skin, unspecified: Secondary | ICD-10-CM

## 2017-12-15 DIAGNOSIS — Z85820 Personal history of malignant melanoma of skin: Secondary | ICD-10-CM | POA: Diagnosis not present

## 2017-12-15 DIAGNOSIS — Z7982 Long term (current) use of aspirin: Secondary | ICD-10-CM | POA: Diagnosis not present

## 2017-12-15 LAB — COMPREHENSIVE METABOLIC PANEL
ALT: 16 U/L (ref 0–55)
AST: 22 U/L (ref 5–34)
Albumin: 4 g/dL (ref 3.5–5.0)
Alkaline Phosphatase: 49 U/L (ref 40–150)
Anion gap: 7 (ref 3–11)
BUN: 17 mg/dL (ref 7–26)
CO2: 30 mmol/L — ABNORMAL HIGH (ref 22–29)
Calcium: 9.7 mg/dL (ref 8.4–10.4)
Chloride: 104 mmol/L (ref 98–109)
Creatinine, Ser: 1.25 mg/dL (ref 0.70–1.30)
GFR calc Af Amer: 59 mL/min — ABNORMAL LOW (ref >60)
GFR calc non Af Amer: 51 mL/min — ABNORMAL LOW (ref >60)
Glucose, Bld: 90 mg/dL (ref 70–140)
Potassium: 4.1 mmol/L (ref 3.5–5.1)
Sodium: 141 mmol/L (ref 136–145)
Total Bilirubin: 2 mg/dL — ABNORMAL HIGH (ref 0.2–1.2)
Total Protein: 6.6 g/dL (ref 6.4–8.3)

## 2017-12-15 LAB — CBC WITH DIFFERENTIAL/PLATELET
Basophils Absolute: 0 10*3/uL (ref 0.0–0.1)
Basophils Relative: 0 %
Eosinophils Absolute: 0.1 10*3/uL (ref 0.0–0.5)
Eosinophils Relative: 3 %
HCT: 44.7 % (ref 38.4–49.9)
Hemoglobin: 15.5 g/dL (ref 13.0–17.1)
Lymphocytes Relative: 41 %
Lymphs Abs: 1.8 10*3/uL (ref 0.9–3.3)
MCH: 31.6 pg (ref 27.2–33.4)
MCHC: 34.7 g/dL (ref 32.0–36.0)
MCV: 91 fL (ref 79.3–98.0)
Monocytes Absolute: 0.2 10*3/uL (ref 0.1–0.9)
Monocytes Relative: 5 %
Neutro Abs: 2.3 10*3/uL (ref 1.5–6.5)
Neutrophils Relative %: 51 %
Platelets: 75 10*3/uL — ABNORMAL LOW (ref 140–400)
RBC: 4.91 MIL/uL (ref 4.20–5.82)
RDW: 13.3 % (ref 11.0–14.6)
WBC: 4.5 10*3/uL (ref 4.0–10.3)

## 2017-12-15 LAB — LACTATE DEHYDROGENASE: LDH: 176 U/L (ref 125–245)

## 2017-12-15 NOTE — Progress Notes (Signed)
Hematology and Oncology Follow Up Visit  GREYDEN BESECKER [id number] 05-10-1930 84 y.o. 12/15/2017 9:56 AM    CC: [name] [name], M.D. [name], MD [name]. [name], M.D.    Principle Diagnosis: 84 year old man with;  1. 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  He remains without any evidence of recurrent disease. 2. Thrombocytopenia: Chronic fluctuating in nature.  Etiology related to autoimmune versus reactive causes.  Prior Therapy: He is S/P wide excision and lymph node dissection in 2007 with the final pathology revealed a Clark level III, 1.25-mm melanoma without ulceration or vascular invasion.  He had 0/21 lymph nodes involved.    Current therapy: Active surveillance.  Interim History:   [name] is here for a follow-up.  He reports no major changes in his health since the last visit.  He has reestablish care with dermatology after Dr. Allyson Sabal has retired.  He continues to follow regularly every 6 months.  He does have periodic moles that need to be removed but no evidence of skin cancer melanoma recently.  He is moving slowly because of arthritis and not able to drive.  He denies any active bleeding including hematochezia, melena or epistaxis.  Continues to attend to activities of daily living without any major decline.   He has not reported any headaches or blurry vision or double vision. Has not reported any syncope.  He denies any fevers or chills or sweats.  Did not report any chest pain or shortness of breath.  He denies any cough, wheezing or hemoptysis.  He denies any nausea, vomiting or abdominal pain.  Does not report any early satiety.  He does not report any frequency urgency or hesitancy.  He denies any arthralgias or myalgias.  He denies any lymphadenopathy or petechiae.  He denies any skin rashes or lesions.  Rest of his review of system is is negative.  Medication reviewed without any changes. Current Outpatient Medications  Medication  Sig Dispense Refill  . acetaminophen (TYLENOL) 325 MG tablet Take 2 tablets (650 mg total) by mouth every 4 (four) hours as needed for headache or mild pain.    [name] aspirin EC 81 MG tablet Take 81 mg by mouth daily.    . clopidogrel (PLAVIX) 75 MG tablet TAKE 1 TABLET BY MOUTH ONCE DAILY WITH BREAKFAST 90 tablet 2  . furosemide (LASIX) 20 MG tablet TAKE 1 TABLET BY MOUTH EVERY OTHER DAY 45 tablet 3  . metoprolol succinate (TOPROL-XL) 25 MG 24 hr tablet TAKE 1 TABLET BY MOUTH TWICE DAILY 60 tablet 5  . nitroGLYCERIN (NITROSTAT) 0.4 MG SL tablet Place 1 tablet (0.4 mg total) under the tongue every 5 (five) minutes x 3 doses as needed for chest pain. 25 tablet 2  . pantoprazole (PROTONIX) 40 MG tablet Take 40 mg by mouth daily.     No current facility-administered medications for this visit.      Allergies:  Allergies  Allergen Reactions  . Statins Other (See Comments)    Weak,nervous  . Tape Rash     Physical Exam: Blood pressure (!) 179/87, pulse (!) 59, temperature 97.7 F (36.5 C), temperature source Oral, resp. rate 20, height 5\' 6"  (1.676 m), weight 194 lb 9.6 oz (88.3 kg), SpO2 100 %. ECOG: 1 General appearance: Well-appearing gentleman with appears frail. Oropharynx: Without any thrush or ulcers. Eyes: No scleral icterus. Head: Atraumatic without any abnormalities.  Well-healed scar noted at his mandible. Lymph nodes: No lymphadenopathy noted in the  cervical, supraclavicular, or axillary nodes Heart:regular rate and rhythm, no murmurs or gallops.  No lower extremity edema. Lung: clear, there are any rhonchi, wheezes or dullness to percussion. Abdomin: Soft, nontender without any rebound or guarding.  No shifting dullness or ascites. Musculoskeletal: No joint deformity or effusion. Skin: No ecchymosis or petechiae.  No rashes or lesions.   Lab Results: Lab Results  Component Value Date   WBC 4.5 12/15/2017   HGB 15.5 12/15/2017   HCT 44.7 12/15/2017   MCV 91.0 12/15/2017    PLT 75 (L) 12/15/2017     Chemistry      Component Value Date/Time   NA 140 04/25/2017 1550   NA 139 12/18/2015 1239   K 3.9 04/25/2017 1550   K 4.0 12/18/2015 1239   CL 104 04/25/2017 1550   CL 106 10/17/2012 0754   CO2 29 04/25/2017 1550   CO2 24 12/18/2015 1239   BUN 18 04/25/2017 1550   BUN 18.4 12/18/2015 1239   CREATININE 1.34 (H) 04/25/2017 1550   CREATININE 1.2 12/18/2015 1239      Component Value Date/Time   CALCIUM 9.6 04/25/2017 1550   CALCIUM 9.3 12/18/2015 1239   ALKPHOS 43 12/18/2015 1239   AST 31 12/18/2015 1239   ALT 33 12/18/2015 1239   BILITOT 2.90 (H) 12/18/2015 1239       Impression and Plan:   84 year old gentleman with the following issues:  1.  Melanoma of the face diagnosed in 2007.  He was found to have stage Ib, 1.25 mm melanoma that has been resected with no lymph node involvement after complete lymph node dissection.  The natural course of this disease was reviewed today with the patient and the chance of recurrence is very low from this particular melanoma.  Developing other skin cancers and other melanomas as possible given his high risk nature.  I have urged him to continue to follow with dermatology on a regular basis.  No imaging studies will be needed for follow-up purposes.  2.  Thrombocytopenia: Appears to be related to autoimmune versus reactive findings.  Chronic in nature and remains mild.  No active bleeding noted since the last visit.  I recommended continued observation and surveillance for the time being.  Trial of steroid could be tried in the future if he develops worsening thrombocytopenia or bleeding.  3.  Follow-up: We will be in 1 year or sooner if needed.   15  minutes was spent with the patient face-to-face today.  More than 50% of time was dedicated to patient counseling, education and coordination of his care.     Zola Button, MD 6/13/20199:56 AM

## 2017-12-15 NOTE — Telephone Encounter (Signed)
Appointments scheduled AVS/Calendar printed per 6/13 los °

## 2018-01-09 DIAGNOSIS — E785 Hyperlipidemia, unspecified: Secondary | ICD-10-CM | POA: Diagnosis not present

## 2018-01-09 DIAGNOSIS — C439 Malignant melanoma of skin, unspecified: Secondary | ICD-10-CM | POA: Diagnosis not present

## 2018-01-09 DIAGNOSIS — G4730 Sleep apnea, unspecified: Secondary | ICD-10-CM | POA: Diagnosis not present

## 2018-01-09 DIAGNOSIS — I10 Essential (primary) hypertension: Secondary | ICD-10-CM | POA: Diagnosis not present

## 2018-01-09 DIAGNOSIS — N200 Calculus of kidney: Secondary | ICD-10-CM | POA: Diagnosis not present

## 2018-01-13 DIAGNOSIS — Z0000 Encounter for general adult medical examination without abnormal findings: Secondary | ICD-10-CM | POA: Diagnosis not present

## 2018-02-05 ENCOUNTER — Other Ambulatory Visit: Payer: Self-pay | Admitting: Cardiology

## 2018-02-27 DIAGNOSIS — L819 Disorder of pigmentation, unspecified: Secondary | ICD-10-CM | POA: Diagnosis not present

## 2018-02-27 DIAGNOSIS — L720 Epidermal cyst: Secondary | ICD-10-CM | POA: Diagnosis not present

## 2018-02-27 DIAGNOSIS — L570 Actinic keratosis: Secondary | ICD-10-CM | POA: Diagnosis not present

## 2018-04-10 ENCOUNTER — Other Ambulatory Visit: Payer: Self-pay | Admitting: Cardiology

## 2018-05-08 ENCOUNTER — Other Ambulatory Visit: Payer: Self-pay | Admitting: Adult Health

## 2018-05-12 DIAGNOSIS — I25119 Atherosclerotic heart disease of native coronary artery with unspecified angina pectoris: Secondary | ICD-10-CM | POA: Diagnosis not present

## 2018-05-12 DIAGNOSIS — Z23 Encounter for immunization: Secondary | ICD-10-CM | POA: Diagnosis not present

## 2018-05-12 DIAGNOSIS — M1711 Unilateral primary osteoarthritis, right knee: Secondary | ICD-10-CM | POA: Diagnosis not present

## 2018-05-12 DIAGNOSIS — I10 Essential (primary) hypertension: Secondary | ICD-10-CM | POA: Diagnosis not present

## 2018-05-23 ENCOUNTER — Ambulatory Visit: Payer: Medicare Other | Admitting: Cardiology

## 2018-05-30 DIAGNOSIS — L570 Actinic keratosis: Secondary | ICD-10-CM | POA: Diagnosis not present

## 2018-08-02 DIAGNOSIS — L570 Actinic keratosis: Secondary | ICD-10-CM | POA: Diagnosis not present

## 2018-08-07 ENCOUNTER — Ambulatory Visit: Payer: Medicare Other | Admitting: Cardiology

## 2018-08-07 ENCOUNTER — Encounter: Payer: Self-pay | Admitting: Cardiology

## 2018-08-07 VITALS — BP 168/86 | HR 65 | Ht 71.0 in | Wt 194.0 lb

## 2018-08-07 DIAGNOSIS — I25119 Atherosclerotic heart disease of native coronary artery with unspecified angina pectoris: Secondary | ICD-10-CM | POA: Diagnosis not present

## 2018-08-07 DIAGNOSIS — N183 Chronic kidney disease, stage 3 unspecified: Secondary | ICD-10-CM

## 2018-08-07 DIAGNOSIS — I255 Ischemic cardiomyopathy: Secondary | ICD-10-CM | POA: Diagnosis not present

## 2018-08-07 DIAGNOSIS — I10 Essential (primary) hypertension: Secondary | ICD-10-CM | POA: Diagnosis not present

## 2018-08-07 NOTE — Patient Instructions (Signed)
Medication Instructions: Your physician recommends that you continue on your current medications as directed. Please refer to the Current Medication list given to you today.   Labwork: None today  Procedures/Testing: None today  Follow-Up: 6 months with Dr.McDowell  Any Additional Special Instructions Will Be Listed Below (If Applicable).     If you need a refill on your cardiac medications before your next appointment, please call your pharmacy.   

## 2018-08-07 NOTE — Progress Notes (Signed)
Cardiology Office Note  Date: 08/07/2018   ID: John [name], DOB 03/04/1930, MRN [id number]  PCP: Sinda Du, MD  Primary Cardiologist: [name], MD   Chief Complaint  Patient presents with  . Coronary Artery Disease    History of Present Illness: John [name] is an 85 y.o. male last seen in May 2019.  He is here for a routine visit.  He reports stable dyspnea on exertion, no recurrent angina symptoms or nitroglycerin use.  Still remains functional with ADLs, paces himself with outdoor work.  I reviewed interval lab work obtained by Dr. Luan Pulling last summer as outlined below.  We went over his medications.  Cardiac regimen is stable.  He does not report any active intolerances.  I personally reviewed his ECG today which shows sinus rhythm with increased voltage, diffuse ST-T wave abnormalities, old inferior infarct pattern.  Past Medical History:  Diagnosis Date  . Acute ST elevation myocardial infarction (STEMI) of posterior wall ([place]) 11/11/2015  . CKD (chronic kidney disease), stage III ([place])   . Coronary atherosclerosis of native coronary artery    a. Multivessel status post CABG in 1992. b. STEMI 11/2015 s/p DES to insertion site of LIMA-LAD.  [name] Dilated aortic root ([place])   . Essential hypertension, benign   . Ischemic cardiomyopathy   . Malignant melanoma of skin   . Mixed hyperlipidemia   . Nephrolithiasis   . OSA (obstructive sleep apnea)   . Rosacea   . Thrombocytopenia ([place])     Past Surgical History:  Procedure Laterality Date  . CARDIAC CATHETERIZATION  07/11/90  . CARDIAC CATHETERIZATION N/A 11/11/2015   Procedure: Left Heart Cath and Coronary Angiography;  Surgeon: [name], MD;  Location: [facility];  Service: Cardiovascular;  Laterality: N/A;  . CARDIAC CATHETERIZATION N/A 11/14/2015   Procedure: Coronary Stent Intervention w/Impella;  Surgeon: [name], MD;  Location: [facility];  Service: Cardiovascular;   Laterality: N/A;  . carotid duplex  06/24/2011, 05/12/2009   Right and Left ICAs: Demonstrates a small amount of irregular mixed density plaque with no evidence of diameter reduction, significant tortuosity or any other vascular abnormality. This is a mildly abnormal carotid duplex doppler evaluation.  [name] CATARACT EXTRACTION W/PHACO Right 02/11/2014   Procedure: CATARACT EXTRACTION PHACO AND INTRAOCULAR LENS PLACEMENT (IOC);  Surgeon: [name], MD;  Location: [facility];  Service: Ophthalmology;  Laterality: Right;  CDE 13.83  . CHOLECYSTECTOMY    . CORONARY ARTERY BYPASS GRAFT  1992  . DOPPLER ECHOCARDIOGRAPHY  05/23/2007   Left ventricular systolic function is normal. The transmitral spectral doppler flow pattern is suggestive of impaired LV relaxation. No significant valvular abnormalities.  . heart monitor  04/01/2009   palpitations  . HERNIA REPAIR    . MELANOMA EXCISION     neck  . NM MYOCAR PERF EJECTION FRACTION  02/10/2011   The post stress myocaardial perfusion images show a normal pattern of perfusion in all regions. No significant wall motion abnormalities noted. EKG is negative for ischemia. This is a low risk scan.  [name] TOTAL KNEE ARTHROPLASTY      Current Outpatient Medications  Medication Sig Dispense Refill  . acetaminophen (TYLENOL) 325 MG tablet Take 2 tablets (650 mg total) by mouth every 4 (four) hours as needed for headache or mild pain.    [name] aspirin EC 81 MG tablet Take 81 mg by mouth daily.    . clopidogrel (PLAVIX) 75 MG tablet TAKE 1 TABLET BY  MOUTH ONCE DAILY WITH BREAKFAST 90 tablet 2  . furosemide (LASIX) 20 MG tablet TAKE 1 TABLET BY MOUTH EVERY OTHER DAY 45 tablet 3  . metoprolol succinate (TOPROL-XL) 25 MG 24 hr tablet TAKE 1 TABLET BY MOUTH TWICE DAILY 180 tablet 1  . nitroGLYCERIN (NITROSTAT) 0.4 MG SL tablet Place 1 tablet (0.4 mg total) under the tongue every 5 (five) minutes x 3 doses as needed for chest pain. 25 tablet 2  . pantoprazole (PROTONIX) 40 MG tablet  Take 40 mg by mouth daily.     No current facility-administered medications for this visit.    Allergies:  Statins and Tape   Social History: The patient  reports that he quit smoking about 37 years ago. His smoking use included cigarettes. He started smoking about 75 years ago. He has never used smokeless tobacco. He reports that he does not drink alcohol or use drugs.   ROS:  Please see the history of present illness. Otherwise, complete review of systems is positive for hearing loss.  All other systems are reviewed and negative.   Physical Exam: VS:  BP (!) 168/86 (BP Location: Right Arm)   Pulse 65   Ht 5\' 11"  (1.803 m)   Wt 194 lb (88 kg)   SpO2 97%   BMI 27.06 kg/m , BMI Body mass index is 27.06 kg/m.  Wt Readings from Last 3 Encounters:  08/07/18 194 lb (88 kg)  12/15/17 194 lb 9.6 oz (88.3 kg)  11/24/17 196 lb 6.4 oz (89.1 kg)    General: Elderly male, appears comfortable at rest. HEENT: Conjunctiva and lids normal, oropharynx clear. Neck: Supple, no elevated JVP or carotid bruits, no thyromegaly. Lungs: Clear to auscultation, nonlabored breathing at rest. Cardiac: Regular rate and rhythm, no S3, soft systolic murmur. Abdomen: Soft, nontender, bowel sounds present. Extremities: Mild ankle edema, distal pulses 2+. Skin: Warm and dry. Musculoskeletal: No kyphosis. Neuropsychiatric: Alert and oriented x3, affect grossly appropriate.  Significant hearing loss is evident.  ECG: I personally reviewed the tracing from 04/25/2017 which showed sinus rhythm with prolonged PR interval, IVCD, repolarization abnormalities, PVCs.  Recent Labwork: 12/15/2017: ALT 16; AST 22; BUN 17; Creatinine, Ser 1.25; Hemoglobin 15.5; Platelets 75; Potassium 4.1; Sodium 141  July 2019: Hemoglobin 15.9, platelets 82, BUN 19, creatinine 1.35, potassium 4.0, AST 24, ALT 16, cholesterol 162, triglycerides 164, HDL 33, LDL 96, TSH 1.93  Other Studies Reviewed Today:  Cardiac catheterization and PCI  11/14/2015:  Sequential SVG .  The first limb of the sequential SVG-diagonal/ramus intermedius is patent, but the second limb is occluded.  Origin lesion, before 1st Diag, 40% stenosed.  Prox RCA to Mid RCA lesion, 100% stenosed.  LIMA .  The LIMA is patent, but there is a tight lesion just beyond the insertion site in the mid-LAD  Ost LM lesion, 100% stenosed.  Ost Cx lesion, 100% stenosed.  Mid LAD to Dist LAD lesion, 90% stenosed. Post intervention, there is a 0% residual stenosis.  Severe stenosis of the mid LAD at the LIMA insertion site, treated successfully with PCI using a single drug-eluting stent and hemodynamic support with an Impella CP device.  Echocardiogram 11/13/2015: Study Conclusions  - Left ventricle: The cavity size was normal. There was moderate focal basal hypertrophy of the septum. Systolic function was mildly to moderately reduced. The estimated ejection fraction was in the range of 40% to 45%. There is akinesis of the inferolateral myocardium. There is hypokinesis of the inferior myocardium. Features are consistent with  a pseudonormal left ventricular filling pattern, with concomitant abnormal relaxation and increased filling pressure (grade 2 diastolic dysfunction). Doppler parameters are consistent with high ventricular filling pressure. - Aortic valve: There was mild regurgitation. - Aortic root: The aortic root was mildly dilated. - Left atrium: The atrium was moderately dilated.  Impressions:  - Akinesis of the inferolateral wall and hypokinesis of the inferior wall; overall mild to moderate LV dysfunction; grade 2 diastolic dysfunction with elevated LV filling pressure; mild AI; moderate LAE; mildly dilated aortic root (43 mm); suggest CTA or MRA to further assess.  Assessment and Plan:  1.  Multivessel CAD status post CABG in 1992.  He is more recently status post DES to the LAD at LIMA insertion site in May  2017.  We plan to continue medical therapy and observation in the absence of progressive angina symptoms.  He remains on dual antiplatelet therapy without bleeding problems.  2.  Essential hypertension, on Toprol-XL.  Blood pressure is elevated today.  Keep follow-up with Dr. Luan Pulling.  3.  CKD stage III, last creatinine 1.35.  4.  Ischemic cardiomyopathy with LVEF 40 to 45% range.  Weight has been stable and he reports no change in dyspnea on exertion.  He remains on low-dose Lasix.  Current medicines were reviewed with the patient today.   Orders Placed This Encounter  Procedures  . EKG 12-Lead    Disposition: Follow-up in 6 months.  Signed, [name], MD, [facility] 08/07/2018 1:11 PM    [facility] [address] [place], [place] [address] Phone: [phone]; Fax: [phone]

## 2018-09-13 DIAGNOSIS — L570 Actinic keratosis: Secondary | ICD-10-CM | POA: Diagnosis not present

## 2018-09-15 DIAGNOSIS — I10 Essential (primary) hypertension: Secondary | ICD-10-CM | POA: Diagnosis not present

## 2018-09-15 DIAGNOSIS — D696 Thrombocytopenia, unspecified: Secondary | ICD-10-CM | POA: Diagnosis not present

## 2018-09-15 DIAGNOSIS — I25119 Atherosclerotic heart disease of native coronary artery with unspecified angina pectoris: Secondary | ICD-10-CM | POA: Diagnosis not present

## 2018-09-15 DIAGNOSIS — R040 Epistaxis: Secondary | ICD-10-CM | POA: Diagnosis not present

## 2018-09-15 DIAGNOSIS — M25561 Pain in right knee: Secondary | ICD-10-CM | POA: Diagnosis not present

## 2018-11-06 ENCOUNTER — Other Ambulatory Visit: Payer: Self-pay | Admitting: Cardiology

## 2018-11-28 ENCOUNTER — Telehealth: Payer: Self-pay | Admitting: Oncology

## 2018-11-28 NOTE — Telephone Encounter (Signed)
PMDC 6/12 moved lab/fu from morning to afternoon. Confirmed with patient spouse.

## 2018-12-08 ENCOUNTER — Telehealth: Payer: Self-pay | Admitting: Oncology

## 2018-12-08 NOTE — Telephone Encounter (Signed)
Called pt per 6/5 sch message - pt answer and hung up on me . Unable to reschedule appt.

## 2018-12-15 ENCOUNTER — Inpatient Hospital Stay: Payer: Medicare Other

## 2018-12-15 ENCOUNTER — Other Ambulatory Visit: Payer: Medicare Other

## 2018-12-15 ENCOUNTER — Inpatient Hospital Stay: Payer: Medicare Other | Admitting: Oncology

## 2019-01-19 ENCOUNTER — Telehealth: Payer: Self-pay | Admitting: Oncology

## 2019-01-19 ENCOUNTER — Inpatient Hospital Stay: Payer: Medicare Other | Admitting: Oncology

## 2019-01-19 ENCOUNTER — Other Ambulatory Visit: Payer: Self-pay

## 2019-01-19 ENCOUNTER — Inpatient Hospital Stay: Payer: Medicare Other | Attending: Oncology

## 2019-01-19 VITALS — BP 153/80 | HR 66 | Temp 98.7°F | Resp 17 | Wt 186.6 lb

## 2019-01-19 DIAGNOSIS — Z79899 Other long term (current) drug therapy: Secondary | ICD-10-CM | POA: Diagnosis not present

## 2019-01-19 DIAGNOSIS — D696 Thrombocytopenia, unspecified: Secondary | ICD-10-CM

## 2019-01-19 LAB — CMP (CANCER CENTER ONLY)
ALT: 16 U/L (ref 0–44)
AST: 20 U/L (ref 15–41)
Albumin: 3.6 g/dL (ref 3.5–5.0)
Alkaline Phosphatase: 42 U/L (ref 38–126)
Anion gap: 10 (ref 5–15)
BUN: 16 mg/dL (ref 8–23)
CO2: 25 mmol/L (ref 22–32)
Calcium: 8.6 mg/dL — ABNORMAL LOW (ref 8.9–10.3)
Chloride: 107 mmol/L (ref 98–111)
Creatinine: 1.12 mg/dL (ref 0.61–1.24)
GFR, Est AFR Am: >60 mL/min (ref >60)
GFR, Estimated: 59 mL/min — ABNORMAL LOW (ref >60)
Glucose, Bld: 125 mg/dL — ABNORMAL HIGH (ref 70–99)
Potassium: 3.5 mmol/L (ref 3.5–5.1)
Sodium: 142 mmol/L (ref 135–145)
Total Bilirubin: 3 mg/dL — ABNORMAL HIGH (ref 0.3–1.2)
Total Protein: 6 g/dL — ABNORMAL LOW (ref 6.5–8.1)

## 2019-01-19 LAB — CBC WITH DIFFERENTIAL (CANCER CENTER ONLY)
Abs Immature Granulocytes: 0.01 10*3/uL (ref 0.00–0.07)
Basophils Absolute: 0 10*3/uL (ref 0.0–0.1)
Basophils Relative: 1 %
Eosinophils Absolute: 0.1 10*3/uL (ref 0.0–0.5)
Eosinophils Relative: 3 %
HCT: 41.3 % (ref 39.0–52.0)
Hemoglobin: 14.2 g/dL (ref 13.0–17.0)
Immature Granulocytes: 0 %
Lymphocytes Relative: 36 %
Lymphs Abs: 1.5 10*3/uL (ref 0.7–4.0)
MCH: 31 pg (ref 26.0–34.0)
MCHC: 34.4 g/dL (ref 30.0–36.0)
MCV: 90.2 fL (ref 80.0–100.0)
Monocytes Absolute: 0.2 10*3/uL (ref 0.1–1.0)
Monocytes Relative: 5 %
Neutro Abs: 2.2 10*3/uL (ref 1.7–7.7)
Neutrophils Relative %: 55 %
Platelet Count: 76 10*3/uL — ABNORMAL LOW (ref 150–400)
RBC: 4.58 MIL/uL (ref 4.22–5.81)
RDW: 13.1 % (ref 11.5–15.5)
WBC Count: 4.1 10*3/uL (ref 4.0–10.5)
nRBC: 0 % (ref 0.0–0.2)

## 2019-01-19 NOTE — Telephone Encounter (Signed)
Scheduled appt per 7/17 los.  Printed calendar and avs.

## 2019-01-19 NOTE — Progress Notes (Signed)
Hematology and Oncology Follow Up Visit  John [name] [id number] 12/11/1929 84 y.o. 01/19/2019 8:14 AM    CC: John [name] John [name], M.D. John H. Constance Holster, MD [name]. [name], M.D.    Principle Diagnosis: 85 year old man with:  1.  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  He was found to have IB disease that has been resected. 2. Thrombocytopenia: Reactive versus autoimmune etiology without any need for any treatment at this time.  Prior Therapy: He is S/P wide excision and lymph node dissection in 2007 with the final pathology revealed a Clark level III, 1.25-mm melanoma without ulceration or vascular invasion.  He had 0/21 lymph nodes involved.    Current therapy: Active surveillance.  Interim History:   [name] returns today for a repeat evaluation.  Since the last visit, he reports no major changes in his health.  He reports his wife died last month which affected him psychologically and has affected his eating slightly.  He has lost some weight but no other complaints at this time.  He is not reporting any pain or discomfort.  He denies any skin rashes or lesions.  He denies any bleeding issues.  Patient denied any alteration mental status, neuropathy, confusion or dizziness.  Denies any headaches or lethargy.  Denies any night sweats, weight loss or changes in appetite.  Denied orthopnea, dyspnea on exertion or chest discomfort.  Denies shortness of breath, difficulty breathing hemoptysis or cough.  Denies any abdominal distention, nausea, early satiety or dyspepsia.  Denies any hematuria, frequency, dysuria or nocturia.  Denies any skin irritation, dryness or rash.  Denies any ecchymosis or petechiae.  Denies any lymphadenopathy or clotting.  Denies any heat or cold intolerance.  Denies any anxiety or depression.  Remaining review of system is negative.           Medication updated today personally. Current Outpatient Medications  Medication Sig  Dispense Refill  . acetaminophen (TYLENOL) 325 MG tablet Take 2 tablets (650 mg total) by mouth every 4 (four) hours as needed for headache or mild pain.    [name] aspirin EC 81 MG tablet Take 81 mg by mouth daily.    . clopidogrel (PLAVIX) 75 MG tablet Take 1 tablet by mouth once daily with breakfast 90 tablet 1  . furosemide (LASIX) 20 MG tablet TAKE 1 TABLET BY MOUTH EVERY OTHER DAY 45 tablet 3  . metoprolol succinate (TOPROL-XL) 25 MG 24 hr tablet Take 1 tablet by mouth twice daily 180 tablet 1  . nitroGLYCERIN (NITROSTAT) 0.4 MG SL tablet Place 1 tablet (0.4 mg total) under the tongue every 5 (five) minutes x 3 doses as needed for chest pain. 25 tablet 2  . pantoprazole (PROTONIX) 40 MG tablet Take 40 mg by mouth daily.     No current facility-administered medications for this visit.      Allergies:  Allergies  Allergen Reactions  . Statins Other (See Comments)    Weak,nervous  . Tape Rash   His social history, family history and past medical history updated today without any changes.  Physical Exam: Blood pressure (!) 153/80, pulse 66, temperature 98.7 F (37.1 C), temperature source Tympanic, resp. rate 17, weight 186 lb 9 oz (84.6 kg), SpO2 98 %.   ECOG: 1    General appearance: Comfortable appearing without any discomfort Head: Normocephalic without any trauma Oropharynx: Mucous membranes are moist and pink without any thrush or ulcers. Eyes: Pupils are equal and round reactive to light.  Lymph nodes: No cervical, supraclavicular, inguinal or axillary lymphadenopathy.   Heart:regular rate and rhythm.  S1 and S2 without leg edema. Lung: Clear without any rhonchi or wheezes.  No dullness to percussion. Abdomin: Soft, nontender, nondistended with good bowel sounds.  No hepatosplenomegaly. Musculoskeletal: No joint deformity or effusion.  Full range of motion noted. Neurological: No deficits noted on motor, sensory and deep tendon reflex exam. Skin: No petechial rash or  dryness.  Appeared moist.  .   Lab Results: Lab Results  Component Value Date   WBC 4.5 12/15/2017   HGB 15.5 12/15/2017   HCT 44.7 12/15/2017   MCV 91.0 12/15/2017   PLT 75 (L) 12/15/2017     Chemistry      Component Value Date/Time   NA 141 12/15/2017 0934   NA 139 12/18/2015 1239   K 4.1 12/15/2017 0934   K 4.0 12/18/2015 1239   CL 104 12/15/2017 0934   CL 106 10/17/2012 0754   CO2 30 (H) 12/15/2017 0934   CO2 24 12/18/2015 1239   BUN 17 12/15/2017 0934   BUN 18.4 12/18/2015 1239   CREATININE 1.25 12/15/2017 0934   CREATININE 1.2 12/18/2015 1239      Component Value Date/Time   CALCIUM 9.7 12/15/2017 0934   CALCIUM 9.3 12/18/2015 1239   ALKPHOS 49 12/15/2017 0934   ALKPHOS 43 12/18/2015 1239   AST 22 12/15/2017 0934   AST 31 12/18/2015 1239   ALT 16 12/15/2017 0934   ALT 33 12/18/2015 1239   BILITOT 2.0 (H) 12/15/2017 0934   BILITOT 2.90 (H) 12/18/2015 1239       Impression and Plan:   85 year old man with:  1.  Stage IB melanoma of the face diagnosed in 2007.  He status post surgical resection without any evidence of disease.  He continues to be on active surveillance without any evidence of relapsed disease.  His risk of recurrence from his original melanoma is very low but he is at risk of developing other ones.  He continues to follow with dermatology regarding this issue for continuous surveillance.  Continue to emphasize the importance of skin protection and we have discussed strategies to improve that moving forward.  2.  Thrombocytopenia: His platelet count has fluctuated between 60 and 100 last 13 years.  Etiology related to reactive findings versus autoimmune disease.  At this time he does not report any bleeding and does not have any indication for treatment.  Trial of steroids may be for step of his platelet count drops dramatically or he has active bleeding.  His platelet count continues to be stable today and no intervention is needed.  3.   Follow-up: In 1 year for repeat evaluation.  15  minutes was spent with the patient face-to-face today.  More than 50% of time was spent on reviewing his disease status, treatment options, and answering questions regarding future plan of care.     Zola Button, MD 7/17/20208:14 AM

## 2019-01-22 DIAGNOSIS — H01005 Unspecified blepharitis left lower eyelid: Secondary | ICD-10-CM | POA: Diagnosis not present

## 2019-01-22 DIAGNOSIS — H01004 Unspecified blepharitis left upper eyelid: Secondary | ICD-10-CM | POA: Diagnosis not present

## 2019-01-22 DIAGNOSIS — H01001 Unspecified blepharitis right upper eyelid: Secondary | ICD-10-CM | POA: Diagnosis not present

## 2019-01-22 DIAGNOSIS — H01002 Unspecified blepharitis right lower eyelid: Secondary | ICD-10-CM | POA: Diagnosis not present

## 2019-01-31 DIAGNOSIS — I25119 Atherosclerotic heart disease of native coronary artery with unspecified angina pectoris: Secondary | ICD-10-CM | POA: Diagnosis not present

## 2019-01-31 DIAGNOSIS — I951 Orthostatic hypotension: Secondary | ICD-10-CM | POA: Diagnosis not present

## 2019-01-31 DIAGNOSIS — I10 Essential (primary) hypertension: Secondary | ICD-10-CM | POA: Diagnosis not present

## 2019-02-01 ENCOUNTER — Other Ambulatory Visit: Payer: Self-pay | Admitting: Pulmonary Disease

## 2019-02-01 ENCOUNTER — Other Ambulatory Visit (HOSPITAL_COMMUNITY): Payer: Self-pay | Admitting: Pulmonary Disease

## 2019-02-01 DIAGNOSIS — R269 Unspecified abnormalities of gait and mobility: Secondary | ICD-10-CM

## 2019-02-01 LAB — CMP 10231
ALT: 15 (ref 3–30)
AST: 23
Albumin/Globulin Ratio: 2.1
Albumin: 4
Alkaline Phosphatase: 44
BUN/Creatinine Ratio: 14
BUN: 16 (ref 4–21)
Calcium: 9.1
Carbon Dioxide, Total: 23
Chloride: 106
Creat: 1.16
EGFR (African American): 65
EGFR (Non-African Amer.): 56
Globulin, Total: 1.9
Glucose: 106
Potassium: 3.7
Sodium: 144
Total Bilirubin: 2.4
Total Protein: 5.9 — AB (ref 6.4–8.2)

## 2019-02-01 LAB — CBC WITH DIFF/PLATELET
BASO(ABSOLUTE): 0
Basophils: 0
EOS (ABSOLUTE): 0.1
Eosinophils, %: 3
HCT: 40 (ref 29–41)
Hemoglobin: 14
Immature Granulocytes: 0
LYMPH: 31 %
Lymphs Abs: 1.4
MCH: 31.3
MCHC: 34.7
MCV: 90 (ref 76–111)
Monocytes(Absolute): 0.3
Monocytes: 6
Neutro Abs: 2.7
Neutrophils: 60
RBC: 4.47 (ref 3.87–5.11)
RDW: 13.2
WBC: 4.5
platelet count: 81

## 2019-02-05 DIAGNOSIS — H01002 Unspecified blepharitis right lower eyelid: Secondary | ICD-10-CM | POA: Diagnosis not present

## 2019-02-05 DIAGNOSIS — H01001 Unspecified blepharitis right upper eyelid: Secondary | ICD-10-CM | POA: Diagnosis not present

## 2019-02-05 DIAGNOSIS — H01004 Unspecified blepharitis left upper eyelid: Secondary | ICD-10-CM | POA: Diagnosis not present

## 2019-02-05 DIAGNOSIS — H01005 Unspecified blepharitis left lower eyelid: Secondary | ICD-10-CM | POA: Diagnosis not present

## 2019-02-16 ENCOUNTER — Other Ambulatory Visit: Payer: Self-pay

## 2019-02-16 ENCOUNTER — Ambulatory Visit (HOSPITAL_COMMUNITY)
Admission: RE | Admit: 2019-02-16 | Discharge: 2019-02-16 | Disposition: A | Discharge status: Home / Self Care | Payer: Medicare Other | Source: Ambulatory Visit | Attending: Pulmonary Disease | Admitting: Pulmonary Disease

## 2019-02-16 DIAGNOSIS — R269 Unspecified abnormalities of gait and mobility: Secondary | ICD-10-CM | POA: Diagnosis not present

## 2019-02-16 DIAGNOSIS — R2681 Unsteadiness on feet: Secondary | ICD-10-CM | POA: Diagnosis not present

## 2019-02-19 ENCOUNTER — Other Ambulatory Visit: Payer: Self-pay

## 2019-02-19 ENCOUNTER — Ambulatory Visit (INDEPENDENT_AMBULATORY_CARE_PROVIDER_SITE_OTHER): Payer: Medicare Other | Admitting: Cardiology

## 2019-02-19 ENCOUNTER — Encounter: Payer: Self-pay | Admitting: Cardiology

## 2019-02-19 VITALS — BP 180/93 | HR 61 | Temp 97.5°F | Ht 71.0 in | Wt 188.0 lb

## 2019-02-19 DIAGNOSIS — I255 Ischemic cardiomyopathy: Secondary | ICD-10-CM | POA: Diagnosis not present

## 2019-02-19 DIAGNOSIS — E782 Mixed hyperlipidemia: Secondary | ICD-10-CM | POA: Diagnosis not present

## 2019-02-19 DIAGNOSIS — I25119 Atherosclerotic heart disease of native coronary artery with unspecified angina pectoris: Secondary | ICD-10-CM | POA: Diagnosis not present

## 2019-02-19 DIAGNOSIS — I10 Essential (primary) hypertension: Secondary | ICD-10-CM

## 2019-02-19 DIAGNOSIS — E785 Hyperlipidemia, unspecified: Secondary | ICD-10-CM | POA: Diagnosis not present

## 2019-02-19 DIAGNOSIS — I2510 Atherosclerotic heart disease of native coronary artery without angina pectoris: Secondary | ICD-10-CM | POA: Diagnosis not present

## 2019-02-19 NOTE — Progress Notes (Signed)
Cardiology Office Note  Date: 02/19/2019   ID: John [name], DOB Dec 18, 1929, MRN [id number]  PCP:  Sinda Du, MD  Cardiologist:  [name], MD Electrophysiologist:  None   Chief Complaint  Patient presents with  . Cardiac follow-up    History of Present Illness: John [name] is an 85 y.o. male last seen in February.  He presents today for a routine follow-up with his daughter.  He does not report any obvious angina symptoms or palpitations.  He has had episodes of lightheadedness and dizziness, antihypertensive regimen was cut back by Dr. Luan Pulling and he is also undergoing further work-up. He was placed on a new medication this morning but cannot recall the name at this time.  He also had a recent head CT which showed no acute abnormalities.  The episodes of lightheadedness seem to be more when he stands up.  His blood pressure was elevated today.  Reviewed his current cardiac regimen which is outlined below.  He has not used any nitroglycerin.  Past Medical History:  Diagnosis Date  . Acute ST elevation myocardial infarction (STEMI) of posterior wall ([place]) 11/11/2015  . CKD (chronic kidney disease), stage III ([place])   . Coronary atherosclerosis of native coronary artery    a. Multivessel status post CABG in 1992. b. STEMI 11/2015 s/p DES to insertion site of LIMA-LAD.  [name] Dilated aortic root ([place])   . Essential hypertension, benign   . Ischemic cardiomyopathy   . Malignant melanoma of skin   . Mixed hyperlipidemia   . Nephrolithiasis   . OSA (obstructive sleep apnea)   . Rosacea   . Thrombocytopenia ([place])     Past Surgical History:  Procedure Laterality Date  . CARDIAC CATHETERIZATION  07/11/90  . CARDIAC CATHETERIZATION N/A 11/11/2015   Procedure: Left Heart Cath and Coronary Angiography;  Surgeon: [name], MD;  Location: [place] [facility];  Service: Cardiovascular;  Laterality: N/A;  . CARDIAC CATHETERIZATION N/A 11/14/2015   Procedure: Coronary Stent  Intervention w/Impella;  Surgeon: [name], MD;  Location: [place] [facility];  Service: Cardiovascular;  Laterality: N/A;  . carotid duplex  06/24/2011, 05/12/2009   Right and Left ICAs: Demonstrates a small amount of irregular mixed density plaque with no evidence of diameter reduction, significant tortuosity or any other vascular abnormality. This is a mildly abnormal carotid duplex doppler evaluation.  [name] CATARACT EXTRACTION W/PHACO Right 02/11/2014   Procedure: CATARACT EXTRACTION PHACO AND INTRAOCULAR LENS PLACEMENT (IOC);  Surgeon: [name], MD;  Location: [facility];  Service: Ophthalmology;  Laterality: Right;  CDE 13.83  . CHOLECYSTECTOMY    . CORONARY ARTERY BYPASS GRAFT  1992  . DOPPLER ECHOCARDIOGRAPHY  05/23/2007   Left ventricular systolic function is normal. The transmitral spectral doppler flow pattern is suggestive of impaired LV relaxation. No significant valvular abnormalities.  . heart monitor  04/01/2009   palpitations  . HERNIA REPAIR    . MELANOMA EXCISION     neck  . NM MYOCAR PERF EJECTION FRACTION  02/10/2011   The post stress myocaardial perfusion images show a normal pattern of perfusion in all regions. No significant wall motion abnormalities noted. EKG is negative for ischemia. This is a low risk scan.  [name] TOTAL KNEE ARTHROPLASTY      Current Outpatient Medications  Medication Sig Dispense Refill  . acetaminophen (TYLENOL) 325 MG tablet Take 2 tablets (650 mg total) by mouth every 4 (four) hours as needed for headache or mild pain.    [name]  aspirin EC 81 MG tablet Take 81 mg by mouth daily.    . clopidogrel (PLAVIX) 75 MG tablet Take 1 tablet by mouth once daily with breakfast 90 tablet 1  . furosemide (LASIX) 20 MG tablet TAKE 1 TABLET BY MOUTH EVERY OTHER DAY 45 tablet 3  . metoprolol succinate (TOPROL-XL) 25 MG 24 hr tablet Take 1 tablet by mouth twice daily (Patient taking differently: 12.5 mg. ) 180 tablet 1  . nitroGLYCERIN (NITROSTAT) 0.4 MG SL tablet Place 1  tablet (0.4 mg total) under the tongue every 5 (five) minutes x 3 doses as needed for chest pain. 25 tablet 2  . pantoprazole (PROTONIX) 40 MG tablet Take 40 mg by mouth daily.     No current facility-administered medications for this visit.    Allergies:  Statins and Tape   Social History: The patient  reports that he quit smoking about 37 years ago. His smoking use included cigarettes. He started smoking about 75 years ago. He has never used smokeless tobacco. He reports that he does not drink alcohol or use drugs.   ROS:  Please see the history of present illness. Otherwise, complete review of systems is positive for significant hearing loss.  All other systems are reviewed and negative.   Physical Exam: VS:  BP (!) 180/93   Pulse 61   Temp (!) 97.5 F (36.4 C)   Ht 5\' 11"  (1.803 m)   Wt 188 lb (85.3 kg)   BMI 26.22 kg/m , BMI Body mass index is 26.22 kg/m.  Wt Readings from Last 3 Encounters:  02/19/19 188 lb (85.3 kg)  01/19/19 186 lb 9 oz (84.6 kg)  08/07/18 194 lb (88 kg)    General: Elderly male, appears comfortable at rest. HEENT: Conjunctiva and lids normal, wearing a mask. Neck: Supple, no elevated JVP or carotid bruits, no thyromegaly. Lungs: Clear to auscultation, nonlabored breathing at rest. Cardiac: Regular rate and rhythm, no S3, soft systolic murmur. Abdomen: Soft, nontender, bowel sounds present. Extremities: Mild ankle edema, distal pulses 2+. Skin: Warm and dry. Musculoskeletal: No kyphosis. Neuropsychiatric: Alert and oriented x3, affect grossly appropriate.  Hearing loss evident.  ECG:  An ECG dated 08/07/2018 was personally reviewed today and demonstrated:  Sinus rhythm with increased voltage, diffuse ST-T wave abnormalities, old inferior infarct pattern.  Recent Labwork: 01/19/2019: ALT 16; AST 20; BUN 16; Creatinine 1.12; Hemoglobin 14.2; Platelet Count 76; Potassium 3.5; Sodium 142  July 2019: Hemoglobin 15.9, platelets 82, BUN 19, creatinine 1.35,  potassium 4.0, AST 24, ALT 16, cholesterol 162, triglycerides 164, HDL 33, LDL 96, TSH 1.93  Other Studies Reviewed Today:  Cardiac catheterization and PCI 11/14/2015:  Sequential SVG .  The first limb of the sequential SVG-diagonal/ramus intermedius is patent, but the second limb is occluded.  Origin lesion, before 1st Diag, 40% stenosed.  Prox RCA to Mid RCA lesion, 100% stenosed.  LIMA .  The LIMA is patent, but there is a tight lesion just beyond the insertion site in the mid-LAD  Ost LM lesion, 100% stenosed.  Ost Cx lesion, 100% stenosed.  Mid LAD to Dist LAD lesion, 90% stenosed. Post intervention, there is a 0% residual stenosis.  Severe stenosis of the mid LAD at the LIMA insertion site, treated successfully with PCI using a single drug-eluting stent and hemodynamic support with an Impella CP device.  Echocardiogram 11/13/2015: Study Conclusions  - Left ventricle: The cavity size was normal. There was moderate focal basal hypertrophy of the septum. Systolic function was  mildly to moderately reduced. The estimated ejection fraction was in the range of 40% to 45%. There is akinesis of the inferolateral myocardium. There is hypokinesis of the inferior myocardium. Features are consistent with a pseudonormal left ventricular filling pattern, with concomitant abnormal relaxation and increased filling pressure (grade 2 diastolic dysfunction). Doppler parameters are consistent with high ventricular filling pressure. - Aortic valve: There was mild regurgitation. - Aortic root: The aortic root was mildly dilated. - Left atrium: The atrium was moderately dilated.  Impressions:  - Akinesis of the inferolateral wall and hypokinesis of the inferior wall; overall mild to moderate LV dysfunction; grade 2 diastolic dysfunction with elevated LV filling pressure; mild AI; moderate LAE; mildly dilated aortic root (43 mm); suggest CTA or MRA to  further assess.  Assessment and Plan:  1. Multivessel CAD status post CABG in 1992.  He underwent DES intervention to the LAD at LIMA insertion back in 2017 and remains symptomatically stable at this time on medical therapy.  Continue with observation.  2.  Ischemic cardiomyopathy with LVEF 40 to 45%.  3.  Essential hypertension, blood pressure is elevated today.  Antihypertensives are currently being adjusted by Dr. Luan Pulling.  Medication Adjustments/Labs and Tests Ordered: Current medicines are reviewed at length with the patient today.  Concerns regarding medicines are outlined above.   Tests Ordered: No orders of the defined types were placed in this encounter.   Medication Changes: No orders of the defined types were placed in this encounter.   Disposition:  Follow up 6 months in the [place] office.  Signed, [name], MD, [facility] 02/19/2019 2:53 PM    [place] [facility] [address] [place], [place] [address] Phone: [phone]; Fax: [phone]

## 2019-02-19 NOTE — Patient Instructions (Signed)
Medication Instructions: Your physician recommends that you continue on your current medications as directed. Please refer to the Current Medication list given to you today.   Labwork: none  Procedures/Testing: none  Follow-Up: 6 months with Dr.McDowell  Any Additional Special Instructions Will Be Listed Below (If Applicable).     If you need a refill on your cardiac medications before your next appointment, please call your pharmacy.      Thank you for choosing [facility] !        

## 2019-03-05 DIAGNOSIS — L820 Inflamed seborrheic keratosis: Secondary | ICD-10-CM | POA: Diagnosis not present

## 2019-03-05 DIAGNOSIS — D225 Melanocytic nevi of trunk: Secondary | ICD-10-CM | POA: Diagnosis not present

## 2019-03-05 DIAGNOSIS — C44619 Basal cell carcinoma of skin of left upper limb, including shoulder: Secondary | ICD-10-CM | POA: Diagnosis not present

## 2019-03-05 DIAGNOSIS — L570 Actinic keratosis: Secondary | ICD-10-CM | POA: Diagnosis not present

## 2019-03-05 DIAGNOSIS — L817 Pigmented purpuric dermatosis: Secondary | ICD-10-CM | POA: Diagnosis not present

## 2019-03-05 DIAGNOSIS — Z85828 Personal history of other malignant neoplasm of skin: Secondary | ICD-10-CM | POA: Diagnosis not present

## 2019-03-05 DIAGNOSIS — Z1283 Encounter for screening for malignant neoplasm of skin: Secondary | ICD-10-CM | POA: Diagnosis not present

## 2019-03-05 DIAGNOSIS — Z08 Encounter for follow-up examination after completed treatment for malignant neoplasm: Secondary | ICD-10-CM | POA: Diagnosis not present

## 2019-04-02 DIAGNOSIS — B078 Other viral warts: Secondary | ICD-10-CM | POA: Diagnosis not present

## 2019-04-02 DIAGNOSIS — Z23 Encounter for immunization: Secondary | ICD-10-CM | POA: Diagnosis not present

## 2019-04-02 DIAGNOSIS — Z08 Encounter for follow-up examination after completed treatment for malignant neoplasm: Secondary | ICD-10-CM | POA: Diagnosis not present

## 2019-04-02 DIAGNOSIS — X32XXXD Exposure to sunlight, subsequent encounter: Secondary | ICD-10-CM | POA: Diagnosis not present

## 2019-04-02 DIAGNOSIS — M1990 Unspecified osteoarthritis, unspecified site: Secondary | ICD-10-CM | POA: Diagnosis not present

## 2019-04-02 DIAGNOSIS — D045 Carcinoma in situ of skin of trunk: Secondary | ICD-10-CM | POA: Diagnosis not present

## 2019-04-02 DIAGNOSIS — Z85828 Personal history of other malignant neoplasm of skin: Secondary | ICD-10-CM | POA: Diagnosis not present

## 2019-04-02 DIAGNOSIS — L570 Actinic keratosis: Secondary | ICD-10-CM | POA: Diagnosis not present

## 2019-04-02 DIAGNOSIS — I255 Ischemic cardiomyopathy: Secondary | ICD-10-CM | POA: Diagnosis not present

## 2019-04-02 DIAGNOSIS — I10 Essential (primary) hypertension: Secondary | ICD-10-CM | POA: Diagnosis not present

## 2019-04-02 DIAGNOSIS — I2510 Atherosclerotic heart disease of native coronary artery without angina pectoris: Secondary | ICD-10-CM | POA: Diagnosis not present

## 2019-04-07 ENCOUNTER — Other Ambulatory Visit: Payer: Self-pay | Admitting: Cardiology

## 2019-04-09 DIAGNOSIS — H01001 Unspecified blepharitis right upper eyelid: Secondary | ICD-10-CM | POA: Diagnosis not present

## 2019-04-09 DIAGNOSIS — H01005 Unspecified blepharitis left lower eyelid: Secondary | ICD-10-CM | POA: Diagnosis not present

## 2019-04-09 DIAGNOSIS — H01002 Unspecified blepharitis right lower eyelid: Secondary | ICD-10-CM | POA: Diagnosis not present

## 2019-04-09 DIAGNOSIS — H01004 Unspecified blepharitis left upper eyelid: Secondary | ICD-10-CM | POA: Diagnosis not present

## 2019-04-09 DIAGNOSIS — H02831 Dermatochalasis of right upper eyelid: Secondary | ICD-10-CM | POA: Diagnosis not present

## 2019-04-17 DIAGNOSIS — H9190 Unspecified hearing loss, unspecified ear: Secondary | ICD-10-CM | POA: Diagnosis not present

## 2019-04-17 DIAGNOSIS — H6120 Impacted cerumen, unspecified ear: Secondary | ICD-10-CM | POA: Diagnosis not present

## 2019-04-17 DIAGNOSIS — I10 Essential (primary) hypertension: Secondary | ICD-10-CM | POA: Diagnosis not present

## 2019-04-17 DIAGNOSIS — I2510 Atherosclerotic heart disease of native coronary artery without angina pectoris: Secondary | ICD-10-CM | POA: Diagnosis not present

## 2019-04-18 ENCOUNTER — Other Ambulatory Visit: Payer: Self-pay | Admitting: Cardiology

## 2019-05-28 ENCOUNTER — Ambulatory Visit (INDEPENDENT_AMBULATORY_CARE_PROVIDER_SITE_OTHER): Payer: Medicare Other | Admitting: Cardiology

## 2019-05-28 ENCOUNTER — Other Ambulatory Visit: Payer: Self-pay

## 2019-05-28 ENCOUNTER — Telehealth: Payer: Self-pay | Admitting: Cardiology

## 2019-05-28 ENCOUNTER — Encounter: Payer: Self-pay | Admitting: Cardiology

## 2019-05-28 VITALS — BP 175/96 | HR 64 | Temp 97.6°F | Ht 66.0 in | Wt 192.0 lb

## 2019-05-28 DIAGNOSIS — E782 Mixed hyperlipidemia: Secondary | ICD-10-CM

## 2019-05-28 DIAGNOSIS — I25119 Atherosclerotic heart disease of native coronary artery with unspecified angina pectoris: Secondary | ICD-10-CM

## 2019-05-28 DIAGNOSIS — I255 Ischemic cardiomyopathy: Secondary | ICD-10-CM | POA: Diagnosis not present

## 2019-05-28 DIAGNOSIS — I10 Essential (primary) hypertension: Secondary | ICD-10-CM

## 2019-05-28 MED ORDER — FUROSEMIDE 20 MG PO TABS: 20.0000 mg | ORAL_TABLET | Freq: Every day | ORAL | 3 refills | Status: DC

## 2019-05-28 NOTE — Telephone Encounter (Signed)
Error

## 2019-05-28 NOTE — Progress Notes (Signed)
Cardiology Office Note  Date: 05/28/2019   ID: John [name], DOB 1929-07-25, MRN [id number]  PCP:  Sinda Du, MD  Cardiologist:  [name], MD Electrophysiologist:  None   Chief Complaint  Patient presents with  . Cardiac follow-up    History of Present Illness: John [name] is an 85 y.o. male last seen in August.  He is here with his daughter for a follow-up visit.  Over the last 6 weeks or so he has been experiencing more shortness of breath and orthopnea.  He has also had lower leg swelling and his weight has increased at least 5 or 6 pounds without change in diet or fluid intake.  He does not report any chest tightness or palpitations.  No recent nitroglycerin use.  I reviewed his cardiac medications.  He has a history of orthostatic hypotension and has been on Florinef 0.1 mg daily.  He takes Lasix 20 mg every other day.  No antihypertensive medicines other than Toprol-XL.  His blood pressure is elevated today.  Past Medical History:  Diagnosis Date  . Acute ST elevation myocardial infarction (STEMI) of posterior wall ([place]) 11/11/2015  . CKD (chronic kidney disease), stage III   . Coronary atherosclerosis of native coronary artery    a. Multivessel status post CABG in 1992. b. STEMI 11/2015 s/p DES to insertion site of LIMA-LAD.  [name] Dilated aortic root ([place])   . Essential hypertension   . Ischemic cardiomyopathy   . Malignant melanoma of skin   . Mixed hyperlipidemia   . Nephrolithiasis   . OSA (obstructive sleep apnea)   . Rosacea   . Thrombocytopenia ([place])     Past Surgical History:  Procedure Laterality Date  . CARDIAC CATHETERIZATION  07/11/90  . CARDIAC CATHETERIZATION N/A 11/11/2015   Procedure: Left Heart Cath and Coronary Angiography;  Surgeon: [name], MD;  Location: [place] [facility];  Service: Cardiovascular;  Laterality: N/A;  . CARDIAC CATHETERIZATION N/A 11/14/2015   Procedure: Coronary Stent Intervention w/Impella;  Surgeon:  [name], MD;  Location: [place] [facility];  Service: Cardiovascular;  Laterality: N/A;  . carotid duplex  06/24/2011, 05/12/2009   Right and Left ICAs: Demonstrates a small amount of irregular mixed density plaque with no evidence of diameter reduction, significant tortuosity or any other vascular abnormality. This is a mildly abnormal carotid duplex doppler evaluation.  [name] CATARACT EXTRACTION W/PHACO Right 02/11/2014   Procedure: CATARACT EXTRACTION PHACO AND INTRAOCULAR LENS PLACEMENT (IOC);  Surgeon: [name], MD;  Location: [facility];  Service: Ophthalmology;  Laterality: Right;  CDE 13.83  . CHOLECYSTECTOMY    . CORONARY ARTERY BYPASS GRAFT  1992  . DOPPLER ECHOCARDIOGRAPHY  05/23/2007   Left ventricular systolic function is normal. The transmitral spectral doppler flow pattern is suggestive of impaired LV relaxation. No significant valvular abnormalities.  . heart monitor  04/01/2009   palpitations  . HERNIA REPAIR    . MELANOMA EXCISION     neck  . NM MYOCAR PERF EJECTION FRACTION  02/10/2011   The post stress myocaardial perfusion images show a normal pattern of perfusion in all regions. No significant wall motion abnormalities noted. EKG is negative for ischemia. This is a low risk scan.  [name] TOTAL KNEE ARTHROPLASTY      Current Outpatient Medications  Medication Sig Dispense Refill  . acetaminophen (TYLENOL) 325 MG tablet Take 2 tablets (650 mg total) by mouth every 4 (four) hours as needed for headache or mild pain.    [name]  aspirin EC 81 MG tablet Take 81 mg by mouth daily.    . clopidogrel (PLAVIX) 75 MG tablet Take 1 tablet by mouth once daily with breakfast 90 tablet 0  . furosemide (LASIX) 20 MG tablet Take 1 tablet (20 mg total) by mouth daily. 90 tablet 3  . metoprolol succinate (TOPROL-XL) 25 MG 24 hr tablet Take 1 tablet by mouth twice daily (Patient taking differently: 12.5 mg. ) 180 tablet 1  . nitroGLYCERIN (NITROSTAT) 0.4 MG SL tablet Place 1 tablet (0.4 mg total) under  the tongue every 5 (five) minutes x 3 doses as needed for chest pain. 25 tablet 2  . pantoprazole (PROTONIX) 40 MG tablet Take 40 mg by mouth daily.     No current facility-administered medications for this visit.    Allergies:  Statins and Tape   Social History: The patient  reports that he quit smoking about 37 years ago. His smoking use included cigarettes. He started smoking about 75 years ago. He has never used smokeless tobacco. He reports that he does not drink alcohol or use drugs.   ROS:  Please see the history of present illness. Otherwise, complete review of systems is positive for significant hearing loss.  All other systems are reviewed and negative.   Physical Exam: VS:  BP (!) 175/96   Pulse 64   Temp 97.6 F (36.4 C)   Ht 5\' 6"  (1.676 m)   Wt 192 lb (87.1 kg)   SpO2 94%   BMI 30.99 kg/m , BMI Body mass index is 30.99 kg/m.  Wt Readings from Last 3 Encounters:  05/28/19 192 lb (87.1 kg)  02/19/19 188 lb (85.3 kg)  01/19/19 186 lb 9 oz (84.6 kg)    General: Elderly male, appears comfortable at rest. HEENT: Conjunctiva and lids normal, wearing a mask. Neck: Supple, no elevated JVP or carotid bruits, no thyromegaly. Lungs: Clear to auscultation, nonlabored breathing at rest. Cardiac: Regular rate and rhythm, no S3. Soft systolic murmur, no pericardial rub. Abdomen: Soft, nontender, bowel sounds present. Extremities: 2+ lower leg edema, right greater than left, distal pulses 2+. Skin: Warm and dry. Musculoskeletal: No kyphosis. Neuropsychiatric: Alert and oriented x3, affect grossly appropriate.  ECG:  An ECG dated 08/07/2018 was personally reviewed today and demonstrated:  Sinus rhythm with increased voltage, diffuse ST-T wave abnormalities, old inferior infarct pattern.  Recent Labwork: 01/19/2019: ALT 16; AST 20; BUN 16; Creatinine 1.12; Hemoglobin 14.2; Platelet Count 76; Potassium 3.5; Sodium 142  July 2019: Cholesterol 162, triglycerides 164, HDL 33, LDL 96,  TSH 1.93  Other Studies Reviewed Today:  Cardiac catheterization and PCI 11/14/2015:  Sequential SVG .  The first limb of the sequential SVG-diagonal/ramus intermedius is patent, but the second limb is occluded.  Origin lesion, before 1st Diag, 40% stenosed.  Prox RCA to Mid RCA lesion, 100% stenosed.  LIMA .  The LIMA is patent, but there is a tight lesion just beyond the insertion site in the mid-LAD  Ost LM lesion, 100% stenosed.  Ost Cx lesion, 100% stenosed.  Mid LAD to Dist LAD lesion, 90% stenosed. Post intervention, there is a 0% residual stenosis.  Severe stenosis of the mid LAD at the LIMA insertion site, treated successfully with PCI using a single drug-eluting stent and hemodynamic support with an Impella CP device.  Echocardiogram 11/13/2015: Study Conclusions  - Left ventricle: The cavity size was normal. There was moderate focal basal hypertrophy of the septum. Systolic function was mildly to moderately reduced. The estimated  ejection fraction was in the range of 40% to 45%. There is akinesis of the inferolateral myocardium. There is hypokinesis of the inferior myocardium. Features are consistent with a pseudonormal left ventricular filling pattern, with concomitant abnormal relaxation and increased filling pressure (grade 2 diastolic dysfunction). Doppler parameters are consistent with high ventricular filling pressure. - Aortic valve: There was mild regurgitation. - Aortic root: The aortic root was mildly dilated. - Left atrium: The atrium was moderately dilated.  Impressions:  - Akinesis of the inferolateral wall and hypokinesis of the inferior wall; overall mild to moderate LV dysfunction; grade 2 diastolic dysfunction with elevated LV filling pressure; mild AI; moderate LAE; mildly dilated aortic root (43 mm); suggest CTA or MRA to further assess.  Assessment and Plan:  1.  Increasing shortness of breath and  orthopnea with associated weight gain and leg swelling.  He has a known ischemic cardiomyopathy with LVEF approximately 40 to 45%.  Plan for now is to have him stop Florinef and increase Lasix to 20 mg daily, track weight at home, follow-up with BMET in the next few weeks for reevaluation.  2.  Multivessel CAD status post CABG with subsequent DES intervention to the LIMA to LAD insertion site in 2017.  No obvious angina reported.  He has nitroglycerin available at otherwise continues on beta-blocker along with aspirin and Plavix.  3.  History of orthostasis.  This complicates optimization of medications for his ischemic cardiomyopathy.  Depending on how he does off Florinef and with subsequent blood pressure recordings, might be able to use very low-dose ARB.  Continue beta-blocker with intensification of Lasix for now.  Medication Adjustments/Labs and Tests Ordered: Current medicines are reviewed at length with the patient today.  Concerns regarding medicines are outlined above.   Tests Ordered: Orders Placed This Encounter  Procedures  . Basic Metabolic Panel (BMET)    Medication Changes: Meds ordered this encounter  Medications  . furosemide (LASIX) 20 MG tablet    Sig: Take 1 tablet (20 mg total) by mouth daily.    Dispense:  90 tablet    Refill:  3    05/28/2019 dose increased to every day    Disposition:  Follow up 2 or 3 weeks with [name].  Signed, [name], MD, [facility] 05/28/2019 1:17 PM    [facility] at [facility] [address], [place], [place] [address] Phone: [phone]; Fax: [phone]

## 2019-05-28 NOTE — Patient Instructions (Signed)
Medication Instructions:  HOLD Florinef   INCREASE Lasix to EVERY DAY   *If you need a refill on your cardiac medications before your next appointment, please call your pharmacy*  Lab Work: Bmet Conley next visit   If you have labs (blood work) drawn today and your tests are completely normal, you will receive your results only by: [name] MyChart Message (if you have MyChart) OR . A paper copy in the mail If you have any lab test that is abnormal or we need to change your treatment, we will call you to review the results.  Testing/Procedures: None today  Follow-Up: At [facility], you and your health needs are our priority.  As part of our continuing mission to provide you with exceptional heart care, we have created designated Provider Care Teams.  These Care Teams include your primary Cardiologist (physician) and Advanced Practice Providers (APPs -  Physician Assistants and Nurse Practitioners) who all work together to provide you with the care you need, when you need it.  Your next appointment:   2 week(s)  The format for your next appointment:   In Person  Provider:   Bernerd Pho, PA-C  Other Instructions None     Thank you for choosing [facility] !

## 2019-05-30 DIAGNOSIS — I10 Essential (primary) hypertension: Secondary | ICD-10-CM | POA: Diagnosis not present

## 2019-05-30 DIAGNOSIS — I2510 Atherosclerotic heart disease of native coronary artery without angina pectoris: Secondary | ICD-10-CM | POA: Diagnosis not present

## 2019-05-30 DIAGNOSIS — I509 Heart failure, unspecified: Secondary | ICD-10-CM | POA: Diagnosis not present

## 2019-06-18 ENCOUNTER — Telehealth: Payer: Self-pay

## 2019-06-18 ENCOUNTER — Other Ambulatory Visit: Payer: Self-pay

## 2019-06-18 ENCOUNTER — Other Ambulatory Visit (HOSPITAL_COMMUNITY)
Admission: RE | Admit: 2019-06-18 | Discharge: 2019-06-18 | Disposition: A | Discharge status: Home / Self Care | Payer: Medicare Other | Source: Ambulatory Visit | Attending: Cardiology | Admitting: Cardiology

## 2019-06-18 DIAGNOSIS — I10 Essential (primary) hypertension: Secondary | ICD-10-CM | POA: Insufficient documentation

## 2019-06-18 DIAGNOSIS — I25119 Atherosclerotic heart disease of native coronary artery with unspecified angina pectoris: Secondary | ICD-10-CM | POA: Insufficient documentation

## 2019-06-18 DIAGNOSIS — I255 Ischemic cardiomyopathy: Secondary | ICD-10-CM | POA: Insufficient documentation

## 2019-06-18 LAB — BASIC METABOLIC PANEL
Anion gap: 10 (ref 5–15)
BUN: 13 mg/dL (ref 8–23)
CO2: 32 mmol/L (ref 22–32)
Calcium: 9.4 mg/dL (ref 8.9–10.3)
Chloride: 100 mmol/L (ref 98–111)
Creatinine, Ser: 1.15 mg/dL (ref 0.61–1.24)
GFR calc Af Amer: >60 mL/min (ref >60)
GFR calc non Af Amer: 57 mL/min — ABNORMAL LOW (ref >60)
Glucose, Bld: 119 mg/dL — ABNORMAL HIGH (ref 70–99)
Potassium: 2.9 mmol/L — ABNORMAL LOW (ref 3.5–5.1)
Sodium: 142 mmol/L (ref 135–145)

## 2019-06-18 MED ORDER — POTASSIUM CHLORIDE CRYS ER 20 MEQ PO TBCR: 20.0000 meq | EXTENDED_RELEASE_TABLET | Freq: Every day | ORAL | 3 refills | Status: DC

## 2019-06-18 NOTE — Telephone Encounter (Signed)
-----   Message from [name], MD sent at 06/18/2019  4:12 PM EST ----- Results reviewed.  Potassium low at 2.9, renal function stable.  Continue present Lasix and add KCl 20 mEq daily.

## 2019-06-18 NOTE — Telephone Encounter (Signed)
I spoke with daughter and she will start potassium for father, requested 35 day supply

## 2019-06-20 DIAGNOSIS — L820 Inflamed seborrheic keratosis: Secondary | ICD-10-CM | POA: Diagnosis not present

## 2019-06-20 DIAGNOSIS — C44329 Squamous cell carcinoma of skin of other parts of face: Secondary | ICD-10-CM | POA: Diagnosis not present

## 2019-06-21 ENCOUNTER — Encounter: Payer: Self-pay | Admitting: Physician Assistant

## 2019-06-21 ENCOUNTER — Ambulatory Visit: Payer: Medicare Other | Admitting: Physician Assistant

## 2019-06-21 VITALS — BP 163/90 | HR 73 | Temp 96.8°F | Ht 69.0 in | Wt 182.0 lb

## 2019-06-21 DIAGNOSIS — E876 Hypokalemia: Secondary | ICD-10-CM

## 2019-06-21 DIAGNOSIS — I5043 Acute on chronic combined systolic (congestive) and diastolic (congestive) heart failure: Secondary | ICD-10-CM

## 2019-06-21 DIAGNOSIS — I2510 Atherosclerotic heart disease of native coronary artery without angina pectoris: Secondary | ICD-10-CM

## 2019-06-21 DIAGNOSIS — T502X5A Adverse effect of carbonic-anhydrase inhibitors, benzothiadiazides and other diuretics, initial encounter: Secondary | ICD-10-CM

## 2019-06-21 NOTE — Progress Notes (Addendum)
Cardiology Office Note   Date:  06/21/2019   ID:  John [name], DOB 06-22-1930, MRN [id number]  PCP:  Sinda Du, MD Cardiologist:  [name], MD 05/28/2019 Electrphysiologist: None Rosaria Ferries, PA-C     History of Present Illness: John [name] is a 85 y.o. male with a history of CABG 1992, STEMI 11/2015 s/p DES LIMA, HTN, HLD, OSA, ICM w/ EF 40-45% 2017 echo, thrombocytopenia, nephrolithiasis, melanoma, dilated Ao root, orthostatic hypotension  11/23 office visit, wt up 5-6 lbs, +LE edema, BP up, no anginal sx, Florinef stopped, Lasix 20 mg qod>>qd, BMET in 1-2 wks>>K+ 2.9 on 12/14>>KCl 20 meq qd added  John [name] presents for cardiology follow up.  He saw the dermatologist 12/16 and they burnt a lesion on his R jaw and L eye. No drainage.  He has lost 10 lbs since he started the Lasix. He has not been weighing himself daily but can.   He has been drinking Gatorade daily, was drinking that because it has potassium in it and he knew he needed that.  Has not been able to take the potassium, it is too big for him.   He is breathing much better, feels respiratory status back to normal. LE edema is almost gone. Denies orthopnea or PND.  No chest pain.    Past Medical History:  Diagnosis Date  . Acute ST elevation myocardial infarction (STEMI) of posterior wall ([place]) 11/11/2015  . CKD (chronic kidney disease), stage III   . Coronary atherosclerosis of native coronary artery    a. Multivessel status post CABG in 1992. b. STEMI 11/2015 s/p DES to insertion site of LIMA-LAD.  [name] Dilated aortic root ([place])   . Essential hypertension   . Ischemic cardiomyopathy   . Malignant melanoma of skin   . Mixed hyperlipidemia   . Nephrolithiasis   . OSA (obstructive sleep apnea)   . Rosacea   . Thrombocytopenia ([place])     Past Surgical History:  Procedure Laterality Date  . CARDIAC CATHETERIZATION  07/11/90  . CARDIAC CATHETERIZATION N/A 11/11/2015   Procedure: Left Heart Cath and Coronary Angiography;  Surgeon: [name], MD;  Location: [facility];  Service: Cardiovascular;  Laterality: N/A;  . CARDIAC CATHETERIZATION N/A 11/14/2015   Procedure: Coronary Stent Intervention w/Impella;  Surgeon: [name], MD;  Location: [facility];  Service: Cardiovascular;  Laterality: N/A;  . carotid duplex  06/24/2011, 05/12/2009   Right and Left ICAs: Demonstrates a small amount of irregular mixed density plaque with no evidence of diameter reduction, significant tortuosity or any other vascular abnormality. This is a mildly abnormal carotid duplex doppler evaluation.  [name] CATARACT EXTRACTION W/PHACO Right 02/11/2014   Procedure: CATARACT EXTRACTION PHACO AND INTRAOCULAR LENS PLACEMENT (IOC);  Surgeon: [name], MD;  Location: [facility];  Service: Ophthalmology;  Laterality: Right;  CDE 13.83  . CHOLECYSTECTOMY    . CORONARY ARTERY BYPASS GRAFT  1992  . DOPPLER ECHOCARDIOGRAPHY  05/23/2007   Left ventricular systolic function is normal. The transmitral spectral doppler flow pattern is suggestive of impaired LV relaxation. No significant valvular abnormalities.  . heart monitor  04/01/2009   palpitations  . HERNIA REPAIR    . MELANOMA EXCISION     neck  . NM MYOCAR PERF EJECTION FRACTION  02/10/2011   The post stress myocaardial perfusion images show a normal pattern of perfusion in all regions. No significant wall motion abnormalities noted. EKG is negative for ischemia. This is a  low risk scan.  [name] TOTAL KNEE ARTHROPLASTY      Current Outpatient Medications  Medication Sig Dispense Refill  . acetaminophen (TYLENOL) 325 MG tablet Take 2 tablets (650 mg total) by mouth every 4 (four) hours as needed for headache or mild pain.    [name] aspirin EC 81 MG tablet Take 81 mg by mouth daily.    . clopidogrel (PLAVIX) 75 MG tablet Take 1 tablet by mouth once daily with breakfast 90 tablet 0  . furosemide (LASIX) 20 MG tablet Take 1 tablet (20 mg  total) by mouth daily. 90 tablet 3  . metoprolol succinate (TOPROL-XL) 25 MG 24 hr tablet Take 1 tablet by mouth twice daily (Patient taking differently: 12.5 mg. ) 180 tablet 1  . nitroGLYCERIN (NITROSTAT) 0.4 MG SL tablet Place 1 tablet (0.4 mg total) under the tongue every 5 (five) minutes x 3 doses as needed for chest pain. 25 tablet 2  . pantoprazole (PROTONIX) 40 MG tablet Take 40 mg by mouth daily.    . potassium chloride SA (KLOR-CON M20) 20 MEQ tablet Take 1 tablet (20 mEq total) by mouth daily. 90 tablet 3   No current facility-administered medications for this visit.    Allergies:   Statins and Tape    Social History:  The patient  reports that he quit smoking about 37 years ago. His smoking use included cigarettes. He started smoking about 76 years ago. He has never used smokeless tobacco. He reports that he does not drink alcohol or use drugs.   Family History:  The patient's family history includes Heart attack in his brother and father; Skin cancer in his brother.  He indicated that his mother is deceased. He indicated that his father is deceased.   ROS:  Please see the history of present illness. All other systems are reviewed and negative.    PHYSICAL EXAM: VS:  BP (!) 163/90   Pulse 73   Temp (!) 96.8 F (36 C)   Ht 5\' 9"  (1.753 m)   Wt 82.6 kg   SpO2 96%   BMI 26.88 kg/m  , BMI Body mass index is 26.88 kg/m. GEN: Well nourished, well developed, male in no acute distress HEENT: normal for age  Neck: JVD 9 cm, no carotid bruit, no masses Cardiac: RRR; no murmur, no rubs, or gallops Respiratory:  Decreased BS bases bilaterally, normal work of breathing GI: soft, nontender, nondistended, + BS MS: no deformity or atrophy; trace LE edema; distal pulses are 2+ in all 4 extremities  Skin: warm and dry, no rash Neuro:  Strength and sensation are intact Psych: euthymic mood, full affect   EKG:  EKG is not ordered today.  ECHO: 11/13/2015 - Left ventricle: The  cavity size was normal. There was moderate   focal basal hypertrophy of the septum. Systolic function was   mildly to moderately reduced. The estimated ejection fraction was   in the range of 40% to 45%. There is akinesis of the   inferolateral myocardium. There is hypokinesis of the inferior   myocardium. Features are consistent with a pseudonormal left   ventricular filling pattern, with concomitant abnormal relaxation   and increased filling pressure (grade 2 diastolic dysfunction).   Doppler parameters are consistent with high ventricular filling   pressure. - Aortic valve: There was mild regurgitation. - Aortic root: The aortic root was mildly dilated. - Left atrium: The atrium was moderately dilated.  CATH: 11/14/2015  Sequential SVG .  The first limb  of the sequential SVG-diagonal/ramus intermedius is patent, but the second limb is occluded.  Origin lesion, before 1st Diag, 40% stenosed.  Prox RCA to Mid RCA lesion, 100% stenosed.  LIMA .  The LIMA is patent, but there is a tight lesion just beyond the insertion site in the mid-LAD  Ost LM lesion, 100% stenosed.  Ost Cx lesion, 100% stenosed.  Mid LAD to Dist LAD lesion, 90% stenosed. Post intervention, there is a 0% residual stenosis.    1. Severe native vessel CAD with total occlusion of the left main and RCA 2. S/P CABG with continued patency of the LIMA-LAD and first limb of the sequential SVG-diagonal 3. Total occlusion of the second limb of the SVG-ramus graft (culprit vessel) 4. Severe stenosis of the mid-LAD at the LIMA anastomotic site 5. Severe segmental LV systolic dysfunction Severe stenosis of the mid LAD at the LIMA insertion site, treated successfully with PCI using a single drug-eluting stent and hemodynamic support with an Impella CP device.   Recent Labs: 01/19/2019: ALT 16; Hemoglobin 14.2; Platelet Count 76 06/18/2019: BUN 13; Creatinine, Ser 1.15; Potassium 2.9; Sodium 142  CBC    Component  Value Date/Time   WBC 4.1 01/19/2019 0804   WBC 4.5 12/15/2017 0934   RBC 4.58 01/19/2019 0804   HGB 14.2 01/19/2019 0804   HGB 16.0 12/16/2016 0959   HCT 41.3 01/19/2019 0804   HCT 46.5 12/16/2016 0959   PLT 76 (L) 01/19/2019 0804   PLT 86 (L) 12/16/2016 0959   MCV 90.2 01/19/2019 0804   MCV 90.3 12/16/2016 0959   MCH 31.0 01/19/2019 0804   MCHC 34.4 01/19/2019 0804   RDW 13.1 01/19/2019 0804   RDW 13.9 12/16/2016 0959   LYMPHSABS 1.5 01/19/2019 0804   LYMPHSABS 2.3 12/16/2016 0959   MONOABS 0.2 01/19/2019 0804   MONOABS 0.4 12/16/2016 0959   EOSABS 0.1 01/19/2019 0804   EOSABS 0.3 12/16/2016 0959   BASOSABS 0.0 01/19/2019 0804   BASOSABS 0.1 12/16/2016 0959   CMP Latest Ref Rng & Units 06/18/2019 01/19/2019 12/15/2017  Glucose 70 - 99 mg/dL 119(H) 125(H) 90  BUN 8 - 23 mg/dL 13 16 17   Creatinine 0.61 - 1.24 mg/dL 1.15 1.12 1.25  Sodium 135 - 145 mmol/L 142 142 141  Potassium 3.5 - 5.1 mmol/L 2.9(L) 3.5 4.1  Chloride 98 - 111 mmol/L 100 107 104  CO2 22 - 32 mmol/L 32 25 30(H)  Calcium 8.9 - 10.3 mg/dL 9.4 8.6(L) 9.7  Total Protein 6.5 - 8.1 g/dL - 6.0(L) 6.6  Total Bilirubin 0.3 - 1.2 mg/dL - 3.0(H) 2.0(H)  Alkaline Phos 38 - 126 U/L - 42 49  AST 15 - 41 U/L - 20 22  ALT 0 - 44 U/L - 16 16     Lipid Panel Lab Results  Component Value Date   CHOL 170 11/12/2015   HDL 28 (L) 11/12/2015   LDLCALC 101 (H) 11/12/2015   TRIG 207 (H) 11/12/2015   CHOLHDL 6.1 11/12/2015      Wt Readings from Last 3 Encounters:  06/21/19 82.6 kg  05/28/19 87.1 kg  02/19/19 85.3 kg     Other studies Reviewed: Additional studies/ records that were reviewed today include: Office notes, hospital records and testing.  ASSESSMENT AND PLAN:  1.  Acute combined systolic and diastolic CHF:  - continue daily wts - continue Lasix for now - discussed using daily wts to guide giving Lasix, but do not think that will work well - advised  if any orthostatic sx, hold Lasix, K+ and call -  once he gets off the Gatorade, may not need Lasix every day, discuss w/ Dr Domenic Polite - recheck BMET next week  - ck echo  2. Hypokalemia - take K+ x 2 tabs today and tomorrow, then 1 tab daily - encouraged family to find a way to dissolve the Kdur, will have to change to liquid if cannot find a way to get the pills down  3. Hx CAD - on ASA, Plavix, BB, not on statin as he did not tolerate - no ischemic sx, continue current therapy   Current medicines are reviewed at length with the patient today.  The patient has concerns regarding medicines. Concerns were addressed  The following changes have been made:  Increase Kdur a little since he has not had any  Labs/ tests ordered today include:  No orders of the defined types were placed in this encounter.    Disposition:   FU with [name], MD  Signed, Rosaria Ferries, PA-C  06/21/2019 2:44 PM    [place] Phone: [phone]; Fax: [phone]

## 2019-06-21 NOTE — Patient Instructions (Addendum)
Medication Instructions:  Your physician has recommended you make the following change in your medication:  Take 2 Potassium Today and Tomorrow Then 1 Tablet daily after ( call if unable to)   HOLD Lasix and Potassium if you become Dizzy   *If you need a refill on your cardiac medications before your next appointment, please call your pharmacy*  Lab Work: Your physician recommends that you return for lab work in: Next Week   If you have labs (blood work) drawn today and your tests are completely normal, you will receive your results only by: [name] MyChart Message (if you have MyChart) OR . A paper copy in the mail If you have any lab test that is abnormal or we need to change your treatment, we will call you to review the results.  Testing/Procedures: NONE   Follow-Up: At [facility], you and your health needs are our priority.  As part of our continuing mission to provide you with exceptional heart care, we have created designated Provider Care Teams.  These Care Teams include your primary Cardiologist (physician) and Advanced Practice Providers (APPs -  Physician Assistants and Nurse Practitioners) who all work together to provide you with the care you need, when you need it.  Your next appointment:   1 month(s)  The format for your next appointment:   In Person  Provider:   [name], MD  Other Instructions Thank you for choosing [facility]!     WEIGH DAILY, every am, wearing the same amount of clothing Record weights, contact [name], MD for weight gain of 3 lbs in a day or 5 lbs in a week Limit sodium to 500 mg per meal, total 2000 mg per day Limit all liquids to 1.5-2 liters/quarts per day

## 2019-06-26 ENCOUNTER — Ambulatory Visit (HOSPITAL_COMMUNITY)
Admission: RE | Admit: 2019-06-26 | Discharge: 2019-06-26 | Disposition: A | Discharge status: Home / Self Care | Payer: Medicare Other | Source: Ambulatory Visit | Attending: Physician Assistant | Admitting: Physician Assistant

## 2019-06-26 ENCOUNTER — Other Ambulatory Visit: Payer: Self-pay

## 2019-06-26 ENCOUNTER — Other Ambulatory Visit (HOSPITAL_COMMUNITY)
Admission: RE | Admit: 2019-06-26 | Discharge: 2019-06-26 | Disposition: A | Discharge status: Home / Self Care | Payer: Medicare Other | Source: Ambulatory Visit | Attending: Cardiology | Admitting: Cardiology

## 2019-06-26 DIAGNOSIS — I5043 Acute on chronic combined systolic (congestive) and diastolic (congestive) heart failure: Secondary | ICD-10-CM | POA: Diagnosis not present

## 2019-06-26 LAB — BASIC METABOLIC PANEL
Anion gap: 12 (ref 5–15)
BUN: 15 mg/dL (ref 8–23)
CO2: 28 mmol/L (ref 22–32)
Calcium: 9 mg/dL (ref 8.9–10.3)
Chloride: 102 mmol/L (ref 98–111)
Creatinine, Ser: 1.2 mg/dL (ref 0.61–1.24)
GFR calc Af Amer: >60 mL/min (ref >60)
GFR calc non Af Amer: 54 mL/min — ABNORMAL LOW (ref >60)
Glucose, Bld: 99 mg/dL (ref 70–99)
Potassium: 3.1 mmol/L — ABNORMAL LOW (ref 3.5–5.1)
Sodium: 142 mmol/L (ref 135–145)

## 2019-06-26 NOTE — Progress Notes (Signed)
*  PRELIMINARY RESULTS* Echocardiogram 2D Echocardiogram has been performed.  Leavy Cella 06/26/2019, 3:45 PM

## 2019-06-27 ENCOUNTER — Telehealth: Payer: Self-pay

## 2019-06-27 ENCOUNTER — Telehealth: Payer: Self-pay | Admitting: *Deleted

## 2019-06-27 NOTE — Telephone Encounter (Signed)
-----   Message from Lonn Georgia, PA-C sent at 06/26/2019  5:19 PM EST ----- Please let him know that his heart muscle is weaker than previously. Need to manage the volume well.  Daily wt and low Na diet very important. I will forward this to Dr Domenic Polite, to see if any med changes should be made. However, BP will limit this. No changes for now.  Thank you

## 2019-06-27 NOTE — Telephone Encounter (Signed)
[facility]-cc

## 2019-06-27 NOTE — Telephone Encounter (Signed)
Called patient with test results. No answer. Left message to call back.  

## 2019-06-27 NOTE — Telephone Encounter (Signed)
-----   Message from [name], MD sent at 06/27/2019  4:28 PM EST ----- Results reviewed. Test ordered by [name] and forwarded to me. John [name] has a known cardiomyopathy and unfortunately has had worsening LVEF compared with previous assessment. Per most recent note he was clinically improved in terms of dyspnea following increase in Lasix. If possible it would be good if we could start low dose Entresto (currently not on ACE-I or ARB, also has history of orthostasis but not recently). Please review plan for potassium repletion and standing Lasix with [name] - consider starting low dose Entresto with follow-up BMET in 7-10 days as well. Needs office visit in 4-6 weeks.

## 2019-06-28 ENCOUNTER — Telehealth: Payer: Self-pay

## 2019-06-28 DIAGNOSIS — E876 Hypokalemia: Secondary | ICD-10-CM

## 2019-06-28 MED ORDER — ENTRESTO 24-26 MG PO TABS: 1.0000 | ORAL_TABLET | Freq: Two times a day (BID) | ORAL | 6 refills | Status: DC

## 2019-06-28 MED ORDER — POTASSIUM CHLORIDE CRYS ER 20 MEQ PO TBCR: 40.0000 meq | EXTENDED_RELEASE_TABLET | Freq: Every day | ORAL | 3 refills | Status: DC

## 2019-06-28 NOTE — Telephone Encounter (Signed)
-----   Message from [name], MD sent at 06/27/2019  4:28 PM EST ----- Results reviewed. Test ordered by John [name] and forwarded to me. John [name] has a known cardiomyopathy and unfortunately has had worsening LVEF compared with previous assessment. Per most recent note he was clinically improved in terms of dyspnea following increase in Lasix. If possible it would be good if we could start low dose Entresto (currently not on ACE-I or ARB, also has history of orthostasis but not recently). Please review plan for potassium repletion and standing Lasix with [name] - consider starting low dose Entresto with follow-up BMET in 7-10 days as well. Needs office visit in 4-6 weeks.

## 2019-06-28 NOTE — Telephone Encounter (Signed)
I spoke with daughter, Vaughan Basta.Patient is taking potasium 20 meq daily.He will now start taking 40 meq daily and start Entresto 24/26 mg twice a day.30 Free trial card to pharmacy.I will mail lab slip for repeat bmet

## 2019-07-12 ENCOUNTER — Other Ambulatory Visit (HOSPITAL_COMMUNITY)
Admission: RE | Admit: 2019-07-12 | Discharge: 2019-07-12 | Disposition: A | Discharge status: Home / Self Care | Payer: Medicare Other | Source: Ambulatory Visit | Attending: Cardiology | Admitting: Cardiology

## 2019-07-12 ENCOUNTER — Other Ambulatory Visit: Payer: Self-pay

## 2019-07-12 DIAGNOSIS — E876 Hypokalemia: Secondary | ICD-10-CM | POA: Insufficient documentation

## 2019-07-12 LAB — BASIC METABOLIC PANEL
Anion gap: 7 (ref 5–15)
BUN: 17 mg/dL (ref 8–23)
CO2: 29 mmol/L (ref 22–32)
Calcium: 9.2 mg/dL (ref 8.9–10.3)
Chloride: 106 mmol/L (ref 98–111)
Creatinine, Ser: 1.22 mg/dL (ref 0.61–1.24)
GFR calc Af Amer: >60 mL/min (ref >60)
GFR calc non Af Amer: 53 mL/min — ABNORMAL LOW (ref >60)
Glucose, Bld: 66 mg/dL — ABNORMAL LOW (ref 70–99)
Potassium: 3.6 mmol/L (ref 3.5–5.1)
Sodium: 142 mmol/L (ref 135–145)

## 2019-07-19 ENCOUNTER — Other Ambulatory Visit: Payer: Self-pay | Admitting: Cardiology

## 2019-07-25 DIAGNOSIS — L821 Other seborrheic keratosis: Secondary | ICD-10-CM | POA: Diagnosis not present

## 2019-07-25 DIAGNOSIS — C44311 Basal cell carcinoma of skin of nose: Secondary | ICD-10-CM | POA: Diagnosis not present

## 2019-07-25 DIAGNOSIS — C44329 Squamous cell carcinoma of skin of other parts of face: Secondary | ICD-10-CM | POA: Diagnosis not present

## 2019-07-25 DIAGNOSIS — L570 Actinic keratosis: Secondary | ICD-10-CM | POA: Diagnosis not present

## 2019-07-25 DIAGNOSIS — X32XXXD Exposure to sunlight, subsequent encounter: Secondary | ICD-10-CM | POA: Diagnosis not present

## 2019-07-31 ENCOUNTER — Ambulatory Visit: Payer: Medicare Other | Admitting: Cardiology

## 2019-07-31 ENCOUNTER — Encounter: Payer: Self-pay | Admitting: Cardiology

## 2019-07-31 VITALS — BP 150/75 | HR 60 | Temp 97.5°F | Ht 70.0 in | Wt 182.2 lb

## 2019-07-31 DIAGNOSIS — I255 Ischemic cardiomyopathy: Secondary | ICD-10-CM

## 2019-07-31 DIAGNOSIS — I25119 Atherosclerotic heart disease of native coronary artery with unspecified angina pectoris: Secondary | ICD-10-CM | POA: Diagnosis not present

## 2019-07-31 DIAGNOSIS — E782 Mixed hyperlipidemia: Secondary | ICD-10-CM

## 2019-07-31 DIAGNOSIS — I10 Essential (primary) hypertension: Secondary | ICD-10-CM | POA: Diagnosis not present

## 2019-07-31 MED ORDER — CLOPIDOGREL BISULFATE 75 MG PO TABS
ORAL_TABLET | ORAL | 3 refills | Status: DC
Start: 2019-07-31 — End: 2019-11-28

## 2019-07-31 NOTE — Patient Instructions (Signed)
Medication Instructions:  Your physician recommends that you continue on your current medications as directed. Please refer to the Current Medication list given to you today.  *If you need a refill on your cardiac medications before your next appointment, please call your pharmacy*  Lab Work: None  If you have labs (blood work) drawn today and your tests are completely normal, you will receive your results only by: [name] MyChart Message (if you have MyChart) OR . A paper copy in the mail If you have any lab test that is abnormal or we need to change your treatment, we will call you to review the results.  Testing/Procedures: NONE   Follow-Up: At [facility], you and your health needs are our priority.  As part of our continuing mission to provide you with exceptional heart care, we have created designated Provider Care Teams.  These Care Teams include your primary Cardiologist (physician) and Advanced Practice Providers (APPs -  Physician Assistants and Nurse Practitioners) who all work together to provide you with the care you need, when you need it.  Your next appointment:   4 month(s)  The format for your next appointment:   In Person  Provider:   [name], MD  Other Instructions None      Thank you for choosing [facility] !

## 2019-07-31 NOTE — Progress Notes (Signed)
Cardiology Office Note  Date: 07/31/2019   ID: John [name], DOB 1930/02/19, MRN [id number]  PCP:  Maryruth Hancock, MD  Cardiologist:  [name], MD Electrophysiologist:  None   Chief Complaint  Patient presents with  . Cardiac follow-up    History of Present Illness: John [name] is an 86 y.o. male last seen in December by [name] PA-C.  He is here today with family member for a follow-up visit.  No major change in clinical status.  He has had no obvious angina symptoms, no dizziness or syncope.  He brought in a record of his blood pressure and weight over the last few weeks, I reviewed this with him today.  We went over his cardiac medications which are outlined below.  He reports compliance, no obvious intolerances.  He does need a refill for Plavix.  He has not used any nitroglycerin recently.  Recent lab work is outlined below.  Past Medical History:  Diagnosis Date  . Acute ST elevation myocardial infarction (STEMI) of posterior wall ([place]) 11/11/2015  . CKD (chronic kidney disease), stage III   . Coronary atherosclerosis of native coronary artery    a. Multivessel status post CABG in 1992. b. STEMI 11/2015 s/p DES to insertion site of LIMA-LAD.  [name] Dilated aortic root ([place])   . Essential hypertension   . Ischemic cardiomyopathy   . Malignant melanoma of skin   . Mixed hyperlipidemia   . Nephrolithiasis   . OSA (obstructive sleep apnea)   . Rosacea   . Thrombocytopenia ([place])     Past Surgical History:  Procedure Laterality Date  . CARDIAC CATHETERIZATION  07/11/90  . CARDIAC CATHETERIZATION N/A 11/11/2015   Procedure: Left Heart Cath and Coronary Angiography;  Surgeon: [name], MD;  Location: [facility];  Service: Cardiovascular;  Laterality: N/A;  . CARDIAC CATHETERIZATION N/A 11/14/2015   Procedure: Coronary Stent Intervention w/Impella;  Surgeon: [name], MD;  Location: [facility];  Service: Cardiovascular;  Laterality:  N/A;  . carotid duplex  06/24/2011, 05/12/2009   Right and Left ICAs: Demonstrates a small amount of irregular mixed density plaque with no evidence of diameter reduction, significant tortuosity or any other vascular abnormality. This is a mildly abnormal carotid duplex doppler evaluation.  [name] CATARACT EXTRACTION W/PHACO Right 02/11/2014   Procedure: CATARACT EXTRACTION PHACO AND INTRAOCULAR LENS PLACEMENT (IOC);  Surgeon: [name], MD;  Location: [facility];  Service: Ophthalmology;  Laterality: Right;  CDE 13.83  . CHOLECYSTECTOMY    . CORONARY ARTERY BYPASS GRAFT  1992  . DOPPLER ECHOCARDIOGRAPHY  05/23/2007   Left ventricular systolic function is normal. The transmitral spectral doppler flow pattern is suggestive of impaired LV relaxation. No significant valvular abnormalities.  . heart monitor  04/01/2009   palpitations  . HERNIA REPAIR    . MELANOMA EXCISION     neck  . NM MYOCAR PERF EJECTION FRACTION  02/10/2011   The post stress myocaardial perfusion images show a normal pattern of perfusion in all regions. No significant wall motion abnormalities noted. EKG is negative for ischemia. This is a low risk scan.  [name] TOTAL KNEE ARTHROPLASTY      Current Outpatient Medications  Medication Sig Dispense Refill  . acetaminophen (TYLENOL) 325 MG tablet Take 2 tablets (650 mg total) by mouth every 4 (four) hours as needed for headache or mild pain.    [name] aspirin EC 81 MG tablet Take 81 mg by mouth daily.    [name]  clopidogrel (PLAVIX) 75 MG tablet Take 1 tablet by mouth once daily with breakfast 90 tablet 3  . furosemide (LASIX) 20 MG tablet Take 1 tablet (20 mg total) by mouth daily. 90 tablet 3  . metoprolol succinate (TOPROL-XL) 25 MG 24 hr tablet Take 1 tablet by mouth twice daily 180 tablet 0  . nitroGLYCERIN (NITROSTAT) 0.4 MG SL tablet Place 1 tablet (0.4 mg total) under the tongue every 5 (five) minutes x 3 doses as needed for chest pain. 25 tablet 2  . pantoprazole (PROTONIX) 40 MG tablet Take 40  mg by mouth daily.    . potassium chloride SA (KLOR-CON M20) 20 MEQ tablet Take 2 tablets (40 mEq total) by mouth daily. 180 tablet 3  . sacubitril-valsartan (ENTRESTO) 24-26 MG Take 1 tablet by mouth 2 (two) times daily. 60 tablet 6   No current facility-administered medications for this visit.   Allergies:  Statins and Tape   Social History: The patient  reports that he quit smoking about 38 years ago. His smoking use included cigarettes. He started smoking about 76 years ago. He has never used smokeless tobacco. He reports that he does not drink alcohol or use drugs.   ROS:  Please see the history of present illness. Otherwise, complete review of systems is positive for chronic hearing loss.  All other systems are reviewed and negative.   Physical Exam: VS:  BP (!) 150/75   Pulse 60   Temp (!) 97.5 F (36.4 C)   Ht 5\' 10"  (1.778 m)   Wt 182 lb 3.2 oz (82.6 kg)   SpO2 97%   BMI 26.14 kg/m , BMI Body mass index is 26.14 kg/m.  Wt Readings from Last 3 Encounters:  07/31/19 182 lb 3.2 oz (82.6 kg)  06/21/19 182 lb (82.6 kg)  05/28/19 192 lb (87.1 kg)    General: Elderly male, appears comfortable at rest. HEENT: Conjunctiva and lids normal, wearing a mask. Neck: Supple, no elevated JVP or carotid bruits, no thyromegaly. Lungs: Clear to auscultation, nonlabored breathing at rest. Cardiac: Regular rate and rhythm, no S3, soft systolic murmur. Abdomen: Soft, nontender, bowel sounds present. Extremities: Mild lower leg edema, distal pulses 2+. Skin: Warm and dry. Musculoskeletal: No kyphosis. Neuropsychiatric: Alert and oriented x3, affect grossly appropriate.  ECG:  An ECG dated 08/07/2018 was personally reviewed today and demonstrated:  Sinus rhythm with IVCD, old inferior infarct pattern, diffuse repolarization abnormalities with LVH.  Recent Labwork: 01/19/2019: ALT 16; AST 20; Hemoglobin 14.2; Platelet Count 76 07/12/2019: BUN 17; Creatinine, Ser 1.22; Potassium 3.6; Sodium 142    Other Studies Reviewed Today:  Cardiac catheterization and PCI 11/14/2015:  Sequential SVG .  The first limb of the sequential SVG-diagonal/ramus intermedius is patent, but the second limb is occluded.  Origin lesion, before 1st Diag, 40% stenosed.  Prox RCA to Mid RCA lesion, 100% stenosed.  LIMA .  The LIMA is patent, but there is a tight lesion just beyond the insertion site in the mid-LAD  Ost LM lesion, 100% stenosed.  Ost Cx lesion, 100% stenosed.  Mid LAD to Dist LAD lesion, 90% stenosed. Post intervention, there is a 0% residual stenosis.  Severe stenosis of the mid LAD at the LIMA insertion site, treated successfully with PCI using a single drug-eluting stent and hemodynamic support with an Impella CP device.  Echocardiogram 11/13/2015: Study Conclusions  - Left ventricle: The cavity size was normal. There was moderate focal basal hypertrophy of the septum. Systolic function was mildly to  moderately reduced. The estimated ejection fraction was in the range of 40% to 45%. There is akinesis of the inferolateral myocardium. There is hypokinesis of the inferior myocardium. Features are consistent with a pseudonormal left ventricular filling pattern, with concomitant abnormal relaxation and increased filling pressure (grade 2 diastolic dysfunction). Doppler parameters are consistent with high ventricular filling pressure. - Aortic valve: There was mild regurgitation. - Aortic root: The aortic root was mildly dilated. - Left atrium: The atrium was moderately dilated.  Impressions:  - Akinesis of the inferolateral wall and hypokinesis of the inferior wall; overall mild to moderate LV dysfunction; grade 2 diastolic dysfunction with elevated LV filling pressure; mild AI; moderate LAE; mildly dilated aortic root (43 mm); suggest CTA or MRA to further assess.  Assessment and Plan:  1.  Ischemic cardiomyopathy with LVEF 40 to 45% and  moderate diastolic dysfunction.  He is clinically stable at this time, no significant change in weight and tolerating present dose of Lasix with potassium supplement.  Recent lab work shows stable renal function and normal potassium level.  No changes were made today.  Continue Toprol-XL and Entresto.  2.  Multivessel CAD status post CABG and subsequent DES intervention to the LIMA to LAD insertion site in 2017.  Continue aspirin and Plavix in addition to beta-blocker and as needed nitroglycerin.  Medication Adjustments/Labs and Tests Ordered: Current medicines are reviewed at length with the patient today.  Concerns regarding medicines are outlined above.   Tests Ordered: No orders of the defined types were placed in this encounter.   Medication Changes: Meds ordered this encounter  Medications  . clopidogrel (PLAVIX) 75 MG tablet    Sig: Take 1 tablet by mouth once daily with breakfast    Dispense:  90 tablet    Refill:  3    Disposition:  Follow up 4 months in the [place] office.  Signed, [name], MD, [facility] 07/31/2019 2:34 PM    [place] at [facility] [address] [place], [place] [address] Phone: [phone]; Fax: [phone]

## 2019-08-08 ENCOUNTER — Telehealth: Payer: Self-pay

## 2019-08-08 NOTE — Telephone Encounter (Signed)
Pt states he cannot afford Entresto. Cost is $140.00 month. Please return call to Linda(daughter) Phone: [phone]  Thanks, renee

## 2019-08-08 NOTE — Telephone Encounter (Signed)
Spoke with Vaughan Basta. Informed her I will mail her the [facility] assistance information for her to help pt fill out.

## 2019-08-27 DIAGNOSIS — X32XXXD Exposure to sunlight, subsequent encounter: Secondary | ICD-10-CM | POA: Diagnosis not present

## 2019-08-27 DIAGNOSIS — C44329 Squamous cell carcinoma of skin of other parts of face: Secondary | ICD-10-CM | POA: Diagnosis not present

## 2019-08-27 DIAGNOSIS — C4441 Basal cell carcinoma of skin of scalp and neck: Secondary | ICD-10-CM | POA: Diagnosis not present

## 2019-08-27 DIAGNOSIS — C4361 Malignant melanoma of right upper limb, including shoulder: Secondary | ICD-10-CM | POA: Diagnosis not present

## 2019-08-27 DIAGNOSIS — L570 Actinic keratosis: Secondary | ICD-10-CM | POA: Diagnosis not present

## 2019-08-30 DIAGNOSIS — H01002 Unspecified blepharitis right lower eyelid: Secondary | ICD-10-CM | POA: Diagnosis not present

## 2019-08-30 DIAGNOSIS — H02831 Dermatochalasis of right upper eyelid: Secondary | ICD-10-CM | POA: Diagnosis not present

## 2019-08-30 DIAGNOSIS — H01005 Unspecified blepharitis left lower eyelid: Secondary | ICD-10-CM | POA: Diagnosis not present

## 2019-08-30 DIAGNOSIS — H01004 Unspecified blepharitis left upper eyelid: Secondary | ICD-10-CM | POA: Diagnosis not present

## 2019-08-30 DIAGNOSIS — H01001 Unspecified blepharitis right upper eyelid: Secondary | ICD-10-CM | POA: Diagnosis not present

## 2019-09-03 DIAGNOSIS — C4361 Malignant melanoma of right upper limb, including shoulder: Secondary | ICD-10-CM | POA: Diagnosis not present

## 2019-09-04 DIAGNOSIS — H02132 Senile ectropion of right lower eyelid: Secondary | ICD-10-CM | POA: Diagnosis not present

## 2019-09-04 DIAGNOSIS — H02112 Cicatricial ectropion of right lower eyelid: Secondary | ICD-10-CM | POA: Diagnosis not present

## 2019-09-04 DIAGNOSIS — H02834 Dermatochalasis of left upper eyelid: Secondary | ICD-10-CM | POA: Diagnosis not present

## 2019-09-04 DIAGNOSIS — H02831 Dermatochalasis of right upper eyelid: Secondary | ICD-10-CM | POA: Diagnosis not present

## 2019-09-04 DIAGNOSIS — H02135 Senile ectropion of left lower eyelid: Secondary | ICD-10-CM | POA: Diagnosis not present

## 2019-09-05 DIAGNOSIS — C4361 Malignant melanoma of right upper limb, including shoulder: Secondary | ICD-10-CM | POA: Diagnosis not present

## 2019-09-10 DIAGNOSIS — D485 Neoplasm of uncertain behavior of skin: Secondary | ICD-10-CM | POA: Diagnosis not present

## 2019-09-11 ENCOUNTER — Inpatient Hospital Stay (HOSPITAL_COMMUNITY)
Admission: EM | Admit: 2019-09-11 | Discharge: 2019-09-16 | DRG: 871 | Disposition: A | Discharge status: Home Health | Payer: Medicare Other | Attending: Internal Medicine | Admitting: Internal Medicine

## 2019-09-11 ENCOUNTER — Emergency Department (HOSPITAL_COMMUNITY): Payer: Medicare Other

## 2019-09-11 ENCOUNTER — Encounter (HOSPITAL_COMMUNITY): Payer: Self-pay

## 2019-09-11 ENCOUNTER — Other Ambulatory Visit: Payer: Self-pay

## 2019-09-11 DIAGNOSIS — Z7902 Long term (current) use of antithrombotics/antiplatelets: Secondary | ICD-10-CM | POA: Diagnosis not present

## 2019-09-11 DIAGNOSIS — E782 Mixed hyperlipidemia: Secondary | ICD-10-CM | POA: Diagnosis present

## 2019-09-11 DIAGNOSIS — I130 Hypertensive heart and chronic kidney disease with heart failure and stage 1 through stage 4 chronic kidney disease, or unspecified chronic kidney disease: Secondary | ICD-10-CM | POA: Diagnosis not present

## 2019-09-11 DIAGNOSIS — H9190 Unspecified hearing loss, unspecified ear: Secondary | ICD-10-CM | POA: Diagnosis not present

## 2019-09-11 DIAGNOSIS — A419 Sepsis, unspecified organism: Secondary | ICD-10-CM | POA: Diagnosis not present

## 2019-09-11 DIAGNOSIS — T380X5A Adverse effect of glucocorticoids and synthetic analogues, initial encounter: Secondary | ICD-10-CM | POA: Diagnosis not present

## 2019-09-11 DIAGNOSIS — I252 Old myocardial infarction: Secondary | ICD-10-CM | POA: Diagnosis not present

## 2019-09-11 DIAGNOSIS — R6520 Severe sepsis without septic shock: Secondary | ICD-10-CM

## 2019-09-11 DIAGNOSIS — I5022 Chronic systolic (congestive) heart failure: Secondary | ICD-10-CM

## 2019-09-11 DIAGNOSIS — R778 Other specified abnormalities of plasma proteins: Secondary | ICD-10-CM

## 2019-09-11 DIAGNOSIS — I959 Hypotension, unspecified: Secondary | ICD-10-CM | POA: Diagnosis not present

## 2019-09-11 DIAGNOSIS — Z87442 Personal history of urinary calculi: Secondary | ICD-10-CM

## 2019-09-11 DIAGNOSIS — N1832 Chronic kidney disease, stage 3b: Secondary | ICD-10-CM

## 2019-09-11 DIAGNOSIS — Z7982 Long term (current) use of aspirin: Secondary | ICD-10-CM

## 2019-09-11 DIAGNOSIS — E876 Hypokalemia: Secondary | ICD-10-CM | POA: Diagnosis not present

## 2019-09-11 DIAGNOSIS — Z87891 Personal history of nicotine dependence: Secondary | ICD-10-CM

## 2019-09-11 DIAGNOSIS — Z8249 Family history of ischemic heart disease and other diseases of the circulatory system: Secondary | ICD-10-CM

## 2019-09-11 DIAGNOSIS — I2510 Atherosclerotic heart disease of native coronary artery without angina pectoris: Secondary | ICD-10-CM | POA: Diagnosis present

## 2019-09-11 DIAGNOSIS — Z79899 Other long term (current) drug therapy: Secondary | ICD-10-CM

## 2019-09-11 DIAGNOSIS — R509 Fever, unspecified: Secondary | ICD-10-CM | POA: Diagnosis not present

## 2019-09-11 DIAGNOSIS — D696 Thrombocytopenia, unspecified: Secondary | ICD-10-CM | POA: Diagnosis not present

## 2019-09-11 DIAGNOSIS — I10 Essential (primary) hypertension: Secondary | ICD-10-CM | POA: Diagnosis present

## 2019-09-11 DIAGNOSIS — Z743 Need for continuous supervision: Secondary | ICD-10-CM | POA: Diagnosis not present

## 2019-09-11 DIAGNOSIS — R531 Weakness: Secondary | ICD-10-CM | POA: Diagnosis not present

## 2019-09-11 DIAGNOSIS — R0689 Other abnormalities of breathing: Secondary | ICD-10-CM | POA: Diagnosis not present

## 2019-09-11 DIAGNOSIS — Z888 Allergy status to other drugs, medicaments and biological substances status: Secondary | ICD-10-CM

## 2019-09-11 DIAGNOSIS — N179 Acute kidney failure, unspecified: Secondary | ICD-10-CM | POA: Diagnosis not present

## 2019-09-11 DIAGNOSIS — E860 Dehydration: Secondary | ICD-10-CM | POA: Diagnosis not present

## 2019-09-11 DIAGNOSIS — G4733 Obstructive sleep apnea (adult) (pediatric): Secondary | ICD-10-CM | POA: Diagnosis present

## 2019-09-11 DIAGNOSIS — K219 Gastro-esophageal reflux disease without esophagitis: Secondary | ICD-10-CM | POA: Diagnosis not present

## 2019-09-11 DIAGNOSIS — N400 Enlarged prostate without lower urinary tract symptoms: Secondary | ICD-10-CM | POA: Diagnosis present

## 2019-09-11 DIAGNOSIS — Z951 Presence of aortocoronary bypass graft: Secondary | ICD-10-CM | POA: Diagnosis not present

## 2019-09-11 DIAGNOSIS — A4189 Other specified sepsis: Secondary | ICD-10-CM | POA: Diagnosis not present

## 2019-09-11 DIAGNOSIS — J1282 Pneumonia due to Coronavirus disease 2019: Secondary | ICD-10-CM | POA: Diagnosis present

## 2019-09-11 DIAGNOSIS — U071 COVID-19: Secondary | ICD-10-CM

## 2019-09-11 DIAGNOSIS — Z85820 Personal history of malignant melanoma of skin: Secondary | ICD-10-CM

## 2019-09-11 DIAGNOSIS — Z955 Presence of coronary angioplasty implant and graft: Secondary | ICD-10-CM

## 2019-09-11 DIAGNOSIS — R410 Disorientation, unspecified: Secondary | ICD-10-CM | POA: Diagnosis not present

## 2019-09-11 DIAGNOSIS — I255 Ischemic cardiomyopathy: Secondary | ICD-10-CM | POA: Diagnosis not present

## 2019-09-11 DIAGNOSIS — R52 Pain, unspecified: Secondary | ICD-10-CM | POA: Diagnosis not present

## 2019-09-11 DIAGNOSIS — Z91048 Other nonmedicinal substance allergy status: Secondary | ICD-10-CM

## 2019-09-11 LAB — DIFFERENTIAL
Abs Immature Granulocytes: 0.16 10*3/uL — ABNORMAL HIGH (ref 0.00–0.07)
Basophils Absolute: 0 10*3/uL (ref 0.0–0.1)
Basophils Relative: 0 %
Eosinophils Absolute: 0 10*3/uL (ref 0.0–0.5)
Eosinophils Relative: 0 %
Immature Granulocytes: 1 %
Lymphocytes Relative: 8 %
Lymphs Abs: 1.2 10*3/uL (ref 0.7–4.0)
Monocytes Absolute: 0.4 10*3/uL (ref 0.1–1.0)
Monocytes Relative: 3 %
Neutro Abs: 12.2 10*3/uL — ABNORMAL HIGH (ref 1.7–7.7)
Neutrophils Relative %: 88 %

## 2019-09-11 LAB — RESPIRATORY PANEL BY RT PCR (FLU A&B, COVID)
Influenza A by PCR: NEGATIVE
Influenza B by PCR: NEGATIVE
SARS Coronavirus 2 by RT PCR: POSITIVE — AB

## 2019-09-11 LAB — TROPONIN I (HIGH SENSITIVITY)
Troponin I (High Sensitivity): 110 ng/L (ref <18)
Troponin I (High Sensitivity): 108 ng/L (ref <18)

## 2019-09-11 LAB — CBC WITH DIFFERENTIAL/PLATELET
Abs Immature Granulocytes: 0.14 10*3/uL — ABNORMAL HIGH (ref 0.00–0.07)
Basophils Absolute: 0 10*3/uL (ref 0.0–0.1)
Basophils Relative: 0 %
Eosinophils Absolute: 0 10*3/uL (ref 0.0–0.5)
Eosinophils Relative: 0 %
HCT: 38.1 % — ABNORMAL LOW (ref 39.0–52.0)
Hemoglobin: 12.6 g/dL — ABNORMAL LOW (ref 13.0–17.0)
Immature Granulocytes: 1 %
Lymphocytes Relative: 9 %
Lymphs Abs: 1.2 10*3/uL (ref 0.7–4.0)
MCH: 30.7 pg (ref 26.0–34.0)
MCHC: 33.1 g/dL (ref 30.0–36.0)
MCV: 92.9 fL (ref 80.0–100.0)
Monocytes Absolute: 0.4 10*3/uL (ref 0.1–1.0)
Monocytes Relative: 3 %
Neutro Abs: 10.9 10*3/uL — ABNORMAL HIGH (ref 1.7–7.7)
Neutrophils Relative %: 87 %
Platelets: 50 10*3/uL — ABNORMAL LOW (ref 150–400)
RBC: 4.1 MIL/uL — ABNORMAL LOW (ref 4.22–5.81)
RDW: 14.4 % (ref 11.5–15.5)
WBC Morphology: INCREASED
WBC: 12.7 10*3/uL — ABNORMAL HIGH (ref 4.0–10.5)
nRBC: 0 % (ref 0.0–0.2)

## 2019-09-11 LAB — PROCALCITONIN: Procalcitonin: 0.54 ng/mL

## 2019-09-11 LAB — LACTIC ACID, PLASMA
Lactic Acid, Venous: 2.5 mmol/L (ref 0.5–1.9)
Lactic Acid, Venous: 1.8 mmol/L (ref 0.5–1.9)

## 2019-09-11 LAB — BASIC METABOLIC PANEL
Anion gap: 12 (ref 5–15)
BUN: 19 mg/dL (ref 8–23)
CO2: 24 mmol/L (ref 22–32)
Calcium: 8.5 mg/dL — ABNORMAL LOW (ref 8.9–10.3)
Chloride: 100 mmol/L (ref 98–111)
Creatinine, Ser: 1.78 mg/dL — ABNORMAL HIGH (ref 0.61–1.24)
GFR calc Af Amer: 39 mL/min — ABNORMAL LOW (ref >60)
GFR calc non Af Amer: 34 mL/min — ABNORMAL LOW (ref >60)
Glucose, Bld: 105 mg/dL — ABNORMAL HIGH (ref 70–99)
Potassium: 3.4 mmol/L — ABNORMAL LOW (ref 3.5–5.1)
Sodium: 136 mmol/L (ref 135–145)

## 2019-09-11 LAB — APTT: aPTT: 39 seconds — ABNORMAL HIGH (ref 24–36)

## 2019-09-11 LAB — FERRITIN
Ferritin: 356 ng/mL — ABNORMAL HIGH (ref 24–336)
Ferritin: 357 ng/mL — ABNORMAL HIGH (ref 24–336)

## 2019-09-11 LAB — HEPATIC FUNCTION PANEL
ALT: 37 U/L (ref 0–44)
AST: 109 U/L — ABNORMAL HIGH (ref 15–41)
Albumin: 3.2 g/dL — ABNORMAL LOW (ref 3.5–5.0)
Alkaline Phosphatase: 59 U/L (ref 38–126)
Bilirubin, Direct: 0.5 mg/dL — ABNORMAL HIGH (ref 0.0–0.2)
Indirect Bilirubin: 1.7 mg/dL — ABNORMAL HIGH (ref 0.3–0.9)
Total Bilirubin: 2.2 mg/dL — ABNORMAL HIGH (ref 0.3–1.2)
Total Protein: 5.8 g/dL — ABNORMAL LOW (ref 6.5–8.1)

## 2019-09-11 LAB — CBC
HCT: 42 % (ref 39.0–52.0)
Hemoglobin: 14.1 g/dL (ref 13.0–17.0)
MCH: 31 pg (ref 26.0–34.0)
MCHC: 33.6 g/dL (ref 30.0–36.0)
MCV: 92.3 fL (ref 80.0–100.0)
Platelets: 55 10*3/uL — ABNORMAL LOW (ref 150–400)
RBC: 4.55 MIL/uL (ref 4.22–5.81)
RDW: 14.4 % (ref 11.5–15.5)
WBC: 13.8 10*3/uL — ABNORMAL HIGH (ref 4.0–10.5)
nRBC: 0 % (ref 0.0–0.2)

## 2019-09-11 LAB — BRAIN NATRIURETIC PEPTIDE: B Natriuretic Peptide: 1832 pg/mL — ABNORMAL HIGH (ref 0.0–100.0)

## 2019-09-11 LAB — LACTATE DEHYDROGENASE: LDH: 232 U/L — ABNORMAL HIGH (ref 98–192)

## 2019-09-11 LAB — PROTIME-INR
INR: 1.3 — ABNORMAL HIGH (ref 0.8–1.2)
Prothrombin Time: 15.6 seconds — ABNORMAL HIGH (ref 11.4–15.2)

## 2019-09-11 LAB — VITAMIN D 25 HYDROXY (VIT D DEFICIENCY, FRACTURES): Vit D, 25-Hydroxy: 30.58 ng/mL (ref 30–100)

## 2019-09-11 LAB — ABO/RH: ABO/RH(D): O POS

## 2019-09-11 LAB — TRIGLYCERIDES: Triglycerides: 54 mg/dL (ref <150)

## 2019-09-11 LAB — TSH: TSH: 1.337 u[IU]/mL (ref 0.350–4.500)

## 2019-09-11 LAB — C-REACTIVE PROTEIN
CRP: 5 mg/dL — ABNORMAL HIGH (ref <1.0)
CRP: 5 mg/dL — ABNORMAL HIGH (ref <1.0)

## 2019-09-11 LAB — FIBRINOGEN: Fibrinogen: 307 mg/dL (ref 210–475)

## 2019-09-11 LAB — CBG MONITORING, ED: Glucose-Capillary: 116 mg/dL — ABNORMAL HIGH (ref 70–99)

## 2019-09-11 LAB — D-DIMER, QUANTITATIVE: D-Dimer, Quant: 6.32 ug/mL-FEU — ABNORMAL HIGH (ref 0.00–0.50)

## 2019-09-11 MED ORDER — SODIUM CHLORIDE 0.9 % IV SOLN: 200.0000 mg | Freq: Once | INTRAVENOUS | Status: DC

## 2019-09-11 MED ORDER — ASCORBIC ACID 500 MG PO TABS
500.0000 mg | ORAL_TABLET | Freq: Every day | ORAL | Status: DC
  Administered 2019-09-11 – 2019-09-16 (×6): 500 mg via ORAL
  Filled 2019-09-11 (×6): qty 1

## 2019-09-11 MED ORDER — SODIUM CHLORIDE 0.9 % IV BOLUS
1000.0000 mL | Freq: Once | INTRAVENOUS | Status: AC
  Administered 2019-09-11: 1000 mL via INTRAVENOUS

## 2019-09-11 MED ORDER — SODIUM CHLORIDE 0.9 % IV SOLN
100.0000 mg | Freq: Every day | INTRAVENOUS | Status: AC
  Administered 2019-09-12 – 2019-09-15 (×4): 100 mg via INTRAVENOUS
  Filled 2019-09-11 (×2): qty 20
  Filled 2019-09-11: qty 100
  Filled 2019-09-11: qty 20

## 2019-09-11 MED ORDER — IPRATROPIUM-ALBUTEROL 20-100 MCG/ACT IN AERS: 1.0000 | INHALATION_SPRAY | Freq: Four times a day (QID) | RESPIRATORY_TRACT | Status: DC | PRN

## 2019-09-11 MED ORDER — SODIUM CHLORIDE 0.9 % IV BOLUS
500.0000 mL | Freq: Once | INTRAVENOUS | Status: AC
  Administered 2019-09-11: 12:00:00 500 mL via INTRAVENOUS

## 2019-09-11 MED ORDER — SODIUM CHLORIDE 0.9 % IV SOLN: 2.0000 g | INTRAVENOUS | Status: DC

## 2019-09-11 MED ORDER — SODIUM CHLORIDE 0.9 % IV SOLN
100.0000 mg | INTRAVENOUS | Status: AC
  Administered 2019-09-11: 100 mg via INTRAVENOUS
  Filled 2019-09-11 (×3): qty 20

## 2019-09-11 MED ORDER — LEVOFLOXACIN IN D5W 500 MG/100ML IV SOLN
500.0000 mg | Freq: Once | INTRAVENOUS | Status: AC
  Administered 2019-09-11: 500 mg via INTRAVENOUS
  Filled 2019-09-11: qty 100

## 2019-09-11 MED ORDER — ACETAMINOPHEN 325 MG PO TABS
650.0000 mg | ORAL_TABLET | Freq: Four times a day (QID) | ORAL | Status: DC | PRN
  Administered 2019-09-14 – 2019-09-15 (×2): 650 mg via ORAL
  Filled 2019-09-11 (×2): qty 2

## 2019-09-11 MED ORDER — SODIUM CHLORIDE 0.9 % IV SOLN
2.0000 g | INTRAVENOUS | Status: DC
  Administered 2019-09-12 – 2019-09-16 (×5): 2 g via INTRAVENOUS
  Filled 2019-09-11 (×5): qty 20

## 2019-09-11 MED ORDER — CLOPIDOGREL BISULFATE 75 MG PO TABS
75.0000 mg | ORAL_TABLET | Freq: Every day | ORAL | Status: DC
  Administered 2019-09-11 – 2019-09-16 (×6): 75 mg via ORAL
  Filled 2019-09-11 (×6): qty 1

## 2019-09-11 MED ORDER — TAMSULOSIN HCL 0.4 MG PO CAPS
0.4000 mg | ORAL_CAPSULE | Freq: Every day | ORAL | Status: DC
  Administered 2019-09-11 – 2019-09-16 (×6): 0.4 mg via ORAL
  Filled 2019-09-11 (×5): qty 1

## 2019-09-11 MED ORDER — ONDANSETRON HCL 4 MG PO TABS: 4.0000 mg | ORAL_TABLET | Freq: Four times a day (QID) | ORAL | Status: DC | PRN

## 2019-09-11 MED ORDER — ZINC SULFATE 220 (50 ZN) MG PO CAPS
220.0000 mg | ORAL_CAPSULE | Freq: Every day | ORAL | Status: DC
  Administered 2019-09-11 – 2019-09-16 (×6): 220 mg via ORAL
  Filled 2019-09-11 (×5): qty 1

## 2019-09-11 MED ORDER — METOPROLOL SUCCINATE ER 25 MG PO TB24
25.0000 mg | ORAL_TABLET | Freq: Two times a day (BID) | ORAL | Status: DC
  Administered 2019-09-11 – 2019-09-16 (×8): 25 mg via ORAL
  Filled 2019-09-11 (×10): qty 1

## 2019-09-11 MED ORDER — SODIUM CHLORIDE 0.9 % IV SOLN: 500.0000 mg | INTRAVENOUS | Status: DC

## 2019-09-11 MED ORDER — ASPIRIN EC 81 MG PO TBEC
81.0000 mg | DELAYED_RELEASE_TABLET | Freq: Every day | ORAL | Status: DC
  Administered 2019-09-11 – 2019-09-16 (×6): 81 mg via ORAL
  Filled 2019-09-11 (×6): qty 1

## 2019-09-11 MED ORDER — IPRATROPIUM-ALBUTEROL 20-100 MCG/ACT IN AERS
1.0000 | INHALATION_SPRAY | Freq: Four times a day (QID) | RESPIRATORY_TRACT | Status: DC
  Administered 2019-09-11: 20:00:00 1 via RESPIRATORY_TRACT
  Filled 2019-09-11: qty 4

## 2019-09-11 MED ORDER — IPRATROPIUM-ALBUTEROL 20-100 MCG/ACT IN AERS: 1.0000 | INHALATION_SPRAY | Freq: Three times a day (TID) | RESPIRATORY_TRACT | Status: DC

## 2019-09-11 MED ORDER — SODIUM CHLORIDE 0.9% FLUSH
3.0000 mL | Freq: Two times a day (BID) | INTRAVENOUS | Status: DC
  Administered 2019-09-11: 3 mL via INTRAVENOUS

## 2019-09-11 MED ORDER — SODIUM CHLORIDE 0.9 % IV SOLN
INTRAVENOUS | Status: AC
  Administered 2019-09-12: 03:00:00 75 mL/h via INTRAVENOUS

## 2019-09-11 MED ORDER — PANTOPRAZOLE SODIUM 40 MG PO TBEC
40.0000 mg | DELAYED_RELEASE_TABLET | Freq: Every day | ORAL | Status: DC
  Administered 2019-09-11 – 2019-09-16 (×6): 40 mg via ORAL
  Filled 2019-09-11 (×6): qty 1

## 2019-09-11 MED ORDER — GUAIFENESIN-DM 100-10 MG/5ML PO SYRP
10.0000 mL | ORAL_SOLUTION | ORAL | Status: DC | PRN
  Administered 2019-09-11 – 2019-09-14 (×4): 10 mL via ORAL
  Filled 2019-09-11 (×5): qty 10

## 2019-09-11 MED ORDER — DEXAMETHASONE SODIUM PHOSPHATE 10 MG/ML IJ SOLN
6.0000 mg | INTRAMUSCULAR | Status: DC
  Administered 2019-09-11 – 2019-09-12 (×2): 6 mg via INTRAVENOUS
  Filled 2019-09-11 (×2): qty 1

## 2019-09-11 MED ORDER — SODIUM CHLORIDE 0.9 % IV SOLN: 100.0000 mg | Freq: Every day | INTRAVENOUS | Status: DC

## 2019-09-11 MED ORDER — SODIUM CHLORIDE 0.9% FLUSH: 3.0000 mL | INTRAVENOUS | Status: DC | PRN

## 2019-09-11 MED ORDER — SODIUM CHLORIDE 0.9 % IV SOLN: 250.0000 mL | INTRAVENOUS | Status: DC | PRN

## 2019-09-11 MED ORDER — ONDANSETRON HCL 4 MG/2ML IJ SOLN: 4.0000 mg | Freq: Four times a day (QID) | INTRAMUSCULAR | Status: DC | PRN

## 2019-09-11 MED ORDER — SODIUM CHLORIDE 0.9 % IV SOLN
500.0000 mg | INTRAVENOUS | Status: DC
  Administered 2019-09-12 – 2019-09-16 (×5): 500 mg via INTRAVENOUS
  Filled 2019-09-11 (×6): qty 500

## 2019-09-11 NOTE — ED Notes (Signed)
Date and time results received: 09/11/19 1318 (use smartphrase ".now" to insert current time)  Test: Covid Critical Value:Positive  Name of Provider Notified: Dr Roderic Palau  Orders Received? Or Actions Taken?:NA

## 2019-09-11 NOTE — ED Notes (Signed)
Date and time results received: 09/11/19 1316  Test: COVID Critical Value: positive   Name of Provider Notified: Dr Roderic Palau

## 2019-09-11 NOTE — Progress Notes (Addendum)
CRITICAL VALUE ALERT  Critical Value: 108 Troponin  Date & Time Notied:  09/11/2019  03:08pm  Provider Notified: Dr. Dyann Kief  Orders Received/Actions taken: Dr. Dyann Kief called and wants to continue to monitor patient.

## 2019-09-11 NOTE — ED Notes (Signed)
Date and time results received: 03/09/211323 (use smartphrase ".now" to insert current time)  Test: Lactic Acid  Critical Value: 2.5 Name of Provider Notified: Dr Roderic Palau  Orders Received? Or Actions Taken?: NA

## 2019-09-11 NOTE — ED Provider Notes (Signed)
[facility] EMERGENCY DEPARTMENT Provider Note   CSN: [id number] Arrival date & time: 09/11/19  1017     History Chief Complaint  Patient presents with  . Weakness    John [name] is a 86 y.o. male.  Patient complains of weakness and fever.  The history is provided by the patient. No language interpreter was used.  Weakness Severity:  Moderate Onset quality:  Sudden Timing:  Constant Progression:  Worsening Chronicity:  New Context: not alcohol use   Relieved by:  Nothing Worsened by:  Nothing Ineffective treatments:  None tried Associated symptoms: fever   Associated symptoms: no abdominal pain, no chest pain, no cough, no diarrhea, no frequency, no headaches and no seizures        Past Medical History:  Diagnosis Date  . Acute ST elevation myocardial infarction (STEMI) of posterior wall ([place]) 11/11/2015  . CKD (chronic kidney disease), stage III   . Coronary atherosclerosis of native coronary artery    a. Multivessel status post CABG in 1992. b. STEMI 11/2015 s/p DES to insertion site of LIMA-LAD.  [name] Dilated aortic root ([place])   . Essential hypertension   . Ischemic cardiomyopathy   . Malignant melanoma of skin   . Mixed hyperlipidemia   . Nephrolithiasis   . OSA (obstructive sleep apnea)   . Rosacea   . Thrombocytopenia [facility])     Patient Active Problem List   Diagnosis Date Noted  . Acute ST elevation myocardial infarction (STEMI) of posterior wall ([place]) 11/11/2015  . CAD in native artery 09/17/2013  . Essential hypertension, benign 09/17/2013  . Mixed hyperlipidemia 09/17/2013    Past Surgical History:  Procedure Laterality Date  . CARDIAC CATHETERIZATION  07/11/90  . CARDIAC CATHETERIZATION N/A 11/11/2015   Procedure: Left Heart Cath and Coronary Angiography;  Surgeon: [name], MD;  Location: [place] [facility];  Service: Cardiovascular;  Laterality: N/A;  . CARDIAC CATHETERIZATION N/A 11/14/2015   Procedure: Coronary Stent Intervention  w/Impella;  Surgeon: [name], MD;  Location: [facility] [facility];  Service: Cardiovascular;  Laterality: N/A;  . carotid duplex  06/24/2011, 05/12/2009   Right and Left ICAs: Demonstrates a small amount of irregular mixed density plaque with no evidence of diameter reduction, significant tortuosity or any other vascular abnormality. This is a mildly abnormal carotid duplex doppler evaluation.  [name] CATARACT EXTRACTION W/PHACO Right 02/11/2014   Procedure: CATARACT EXTRACTION PHACO AND INTRAOCULAR LENS PLACEMENT (IOC);  Surgeon: [name], MD;  Location: [facility];  Service: Ophthalmology;  Laterality: Right;  CDE 13.83  . CHOLECYSTECTOMY    . CORONARY ARTERY BYPASS GRAFT  1992  . DOPPLER ECHOCARDIOGRAPHY  05/23/2007   Left ventricular systolic function is normal. The transmitral spectral doppler flow pattern is suggestive of impaired LV relaxation. No significant valvular abnormalities.  . heart monitor  04/01/2009   palpitations  . HERNIA REPAIR    . MELANOMA EXCISION     neck  . NM MYOCAR PERF EJECTION FRACTION  02/10/2011   The post stress myocaardial perfusion images show a normal pattern of perfusion in all regions. No significant wall motion abnormalities noted. EKG is negative for ischemia. This is a low risk scan.  [name] TOTAL KNEE ARTHROPLASTY         Family History  Problem Relation Age of Onset  . Heart attack Father   . Skin cancer Brother   . Heart attack Brother     Social History   Tobacco Use  . Smoking status: Former  Smoker    Types: Cigarettes    Start date: 07/06/1943    Quit date: 07/05/1981    Years since quitting: 38.2  . Smokeless tobacco: Never Used  Substance Use Topics  . Alcohol use: No    Alcohol/week: 0.0 standard drinks  . Drug use: No    Home Medications Prior to Admission medications   Medication Sig Start Date End Date Taking? Authorizing Provider  acetaminophen (TYLENOL) 325 MG tablet Take 2 tablets (650 mg total) by mouth every 4 (four) hours as  needed for headache or mild pain. 11/16/15  Yes Erlene Quan, PA-C  aspirin EC 81 MG tablet Take 81 mg by mouth daily.   Yes [provider]  clopidogrel (PLAVIX) 75 MG tablet Take 1 tablet by mouth once daily with breakfast 07/31/19  Yes [name], MD  clotrimazole-betamethasone (LOTRISONE) cream Apply 1 application topically 2 (two) times daily.    Yes [provider]  fludrocortisone (FLORINEF) 0.1 MG tablet Take 100 mcg by mouth daily. 05/22/19  Yes [provider]  furosemide (LASIX) 20 MG tablet Take 1 tablet (20 mg total) by mouth daily. 05/28/19  Yes [name], MD  ibuprofen (ADVIL) 400 MG tablet Take 400 mg by mouth every 6 (six) hours as needed.   Yes [provider]  metoprolol succinate (TOPROL-XL) 25 MG 24 hr tablet Take 1 tablet by mouth twice daily 07/19/19  Yes [name], MD  neomycin-polymyxin b-dexamethasone (MAXITROL) 3.5-10000-0.1 [place] 1 application into the left eye daily.  07/10/19  Yes [provider]  nitroGLYCERIN (NITROSTAT) 0.4 MG SL tablet Place 1 tablet (0.4 mg total) under the tongue every 5 (five) minutes x 3 doses as needed for chest pain. 11/16/15  Yes Kilroy, Luke K, PA-C  pantoprazole (PROTONIX) 40 MG tablet Take 40 mg by mouth daily. 10/29/13  Yes [provider]  potassium chloride SA (KLOR-CON M20) 20 MEQ tablet Take 2 tablets (40 mEq total) by mouth daily. Patient taking differently: Take 20 mEq by mouth daily.  06/28/19 09/26/19 Yes [name], MD  sacubitril-valsartan (ENTRESTO) 24-26 MG Take 1 tablet by mouth 2 (two) times daily. 06/28/19  Yes [name], MD  tamsulosin (FLOMAX) 0.4 MG CAPS capsule Take 0.4 mg by mouth daily. 08/06/19  Yes [provider]    Allergies    Statins and Tape  Review of Systems   Review of Systems  Constitutional: Positive for fever. Negative for appetite change and fatigue.  HENT: Negative for congestion, ear discharge  and sinus pressure.   Eyes: Negative for discharge.  Respiratory: Negative for cough.   Cardiovascular: Negative for chest pain.  Gastrointestinal: Negative for abdominal pain and diarrhea.  Genitourinary: Negative for frequency and hematuria.  Musculoskeletal: Negative for back pain.  Skin: Negative for rash.  Neurological: Positive for weakness. Negative for seizures and headaches.  Psychiatric/Behavioral: Negative for hallucinations.    Physical Exam Updated Vital Signs BP (!) 123/57   Pulse (!) 59   Temp 100.2 F (37.9 C) (Rectal)   Resp (!) 25   Ht 5\' 6"  (1.676 m)   Wt 81.2 kg   SpO2 98%   BMI 28.89 kg/m   Physical Exam Vitals and nursing note reviewed.  Constitutional:      Appearance: He is well-developed.  HENT:     Head: Normocephalic.     Nose: Nose normal.  Eyes:     General: No scleral icterus.    Conjunctiva/sclera: Conjunctivae normal.  Neck:     Thyroid: No thyromegaly.  Cardiovascular:     Rate and Rhythm: Normal rate and regular rhythm.     Heart sounds: No murmur. No friction rub. No gallop.   Pulmonary:     Breath sounds: No stridor. No wheezing or rales.  Chest:     Chest wall: No tenderness.  Abdominal:     General: There is no distension.     Tenderness: There is no abdominal tenderness. There is no rebound.  Musculoskeletal:        General: Normal range of motion.     Cervical back: Neck supple.  Lymphadenopathy:     Cervical: No cervical adenopathy.  Skin:    Findings: No erythema or rash.  Neurological:     Mental Status: He is alert and oriented to person, place, and time.     Motor: No abnormal muscle tone.     Coordination: Coordination normal.  Psychiatric:        Behavior: Behavior normal.     ED Results / Procedures / Treatments   Labs (all labs ordered are listed, but only abnormal results are displayed) Labs Reviewed  RESPIRATORY PANEL BY RT PCR (FLU A&B, COVID) - Abnormal; Notable for the following components:       Result Value   SARS Coronavirus 2 by RT PCR POSITIVE (*)    All other components within normal limits  BASIC METABOLIC PANEL - Abnormal; Notable for the following components:   Potassium 3.4 (*)    Glucose, Bld 105 (*)    Creatinine, Ser 1.78 (*)    Calcium 8.5 (*)    GFR calc non Af Amer 34 (*)    GFR calc Af Amer 39 (*)    All other components within normal limits  CBC - Abnormal; Notable for the following components:   WBC 13.8 (*)    Platelets 55 (*)    All other components within normal limits  HEPATIC FUNCTION PANEL - Abnormal; Notable for the following components:   Total Protein 5.8 (*)    Albumin 3.2 (*)    AST 109 (*)    Total Bilirubin 2.2 (*)    Bilirubin, Direct 0.5 (*)    Indirect Bilirubin 1.7 (*)    All other components within normal limits  LACTATE DEHYDROGENASE - Abnormal; Notable for the following components:   LDH 232 (*)    All other components within normal limits  DIFFERENTIAL - Abnormal; Notable for the following components:   Neutro Abs 12.2 (*)    Abs Immature Granulocytes 0.16 (*)    All other components within normal limits  LACTIC ACID, PLASMA - Abnormal; Notable for the following components:   Lactic Acid, Venous 2.5 (*)    All other components within normal limits  CBC WITH DIFFERENTIAL/PLATELET - Abnormal; Notable for the following components:   WBC 12.7 (*)    RBC 4.10 (*)    Hemoglobin 12.6 (*)    HCT 38.1 (*)    Platelets 50 (*)    Neutro Abs 10.9 (*)    Abs Immature Granulocytes 0.14 (*)    All other components within normal limits  APTT - Abnormal; Notable for the following components:   aPTT 39 (*)    All other components within normal limits  PROTIME-INR - Abnormal; Notable for the following components:   Prothrombin Time 15.6 (*)    INR 1.3 (*)    All other components within normal limits  BRAIN NATRIURETIC PEPTIDE - Abnormal;  Notable for the following components:   B Natriuretic Peptide 1,832.0 (*)    All other components  within normal limits  CBG MONITORING, ED - Abnormal; Notable for the following components:   Glucose-Capillary 116 (*)    All other components within normal limits  TROPONIN I (HIGH SENSITIVITY) - Abnormal; Notable for the following components:   Troponin I (High Sensitivity) 110 (*)    All other components within normal limits  CULTURE, BLOOD (ROUTINE X 2)  CULTURE, BLOOD (ROUTINE X 2)  URINE CULTURE  URINALYSIS, ROUTINE W REFLEX MICROSCOPIC  LACTIC ACID, PLASMA  C-REACTIVE PROTEIN  FIBRINOGEN  TRIGLYCERIDES  FERRITIN  PROCALCITONIN  D-DIMER, QUANTITATIVE (NOT AT [facility])  TROPONIN I (HIGH SENSITIVITY)    EKG EKG Interpretation  Date/Time:  Tuesday September 11 2019 10:40:41 EST Ventricular Rate:  66 PR Interval:    QRS Duration: 134 QT Interval:  492 QTC Calculation: 516 R Axis:   52 Text Interpretation: Junctional rhythm Nonspecific intraventricular conduction delay Repol abnrm suggests ischemia, anterolateral Confirmed by Milton Ferguson [id number]) on 09/11/2019 10:44:50 AM Also confirmed by Milton Ferguson [id number])  on 09/11/2019 12:59:48 PM   Radiology DG Chest Port 1 View  Result Date: 09/11/2019 CLINICAL DATA:  Fever, weakness for several days, hypotensive EXAM: PORTABLE CHEST 1 VIEW COMPARISON:  11/11/2015 FINDINGS: Single frontal view of the chest demonstrates stable enlargement of the cardiac silhouette. Stable postsurgical changes from CABG. Mild ectasia of the thoracic aorta with atherosclerosis of the aortic arch again noted. There is multifocal bilateral airspace disease, greatest in the left lower lobe. No large effusion or pneumothorax. No acute bony abnormality. IMPRESSION: 1. Multifocal bilateral airspace disease greatest in the left lower lobe. Favor multifocal infection over edema. Electronically Signed   By: Randa Ngo M.D.   On: 09/11/2019 12:11    Procedures Procedures (including critical care time)  Medications Ordered in ED Medications  sodium chloride 0.9 %  bolus 1,000 mL (0 mLs Intravenous Stopped 09/11/19 1242)  sodium chloride 0.9 % bolus 500 mL (0 mLs Intravenous Stopped 09/11/19 1242)  levofloxacin (LEVAQUIN) IVPB 500 mg (0 mg Intravenous Stopped 09/11/19 1339)    ED Course  I have reviewed the triage vital signs and the nursing notes.  Pertinent labs & imaging results that were available during my care of the patient were reviewed by me and considered in my medical decision making (see chart for details). CRITICAL CARE Performed by: Milton Ferguson Total critical care time: 40 minutes Critical care time was exclusive of separately billable procedures and treating other patients. Critical care was necessary to treat or prevent imminent or life-threatening deterioration. Critical care was time spent personally by me on the following activities: development of treatment plan with patient and/or surrogate as well as nursing, discussions with consultants, evaluation of patient's response to treatment, examination of patient, obtaining history from patient or surrogate, ordering and performing treatments and interventions, ordering and review of laboratory studies, ordering and review of radiographic studies, pulse oximetry and re-evaluation of patient's condition.    MDM Rules/Calculators/A&P                     John [name] was evaluated in Emergency Department on 09/11/2019 for the symptoms described in the history of present illness. He was evaluated in the context of the global COVID-19 pandemic, which necessitated consideration that the patient might be at risk for infection with the SARS-CoV-2 virus that causes COVID-19. Institutional protocols and algorithms that pertain to the evaluation  of patients at risk for COVID-19 are in a state of rapid change based on information released by regulatory bodies including the CDC and federal and state organizations. These policies and algorithms were followed during the patient's care in the ED. Final  Clinical Impression(s) / ED Diagnoses Final diagnoses:  Weakness  COVID-19   Patient with Covid infection and elevated troponin with significant fatigue.  He will be admitted to medicine Rx / DC Orders ED Discharge Orders    None       Milton Ferguson, MD 09/11/19 1348

## 2019-09-11 NOTE — ED Notes (Signed)
Date and time results received: 09/11/19 1145 (use smartphrase ".now" to insert current time)  Test: Troponin Critical Value: 110  Name of Provider Notified: Dr Roderic Palau  Orders Received? Or Actions Taken?:NA

## 2019-09-11 NOTE — Plan of Care (Signed)
  Problem: Education: Goal: Knowledge of General Education information will improve Description: Including pain rating scale, medication(s)/side effects and non-pharmacologic comfort measures Outcome: Not Progressing   Problem: Health Behavior/Discharge Planning: Goal: Ability to manage health-related needs will improve Outcome: Not Progressing   Problem: Clinical Measurements: Goal: Diagnostic test results will improve Outcome: Not Progressing   

## 2019-09-11 NOTE — ED Notes (Addendum)
Waiting on lab to obtain blood cultures.

## 2019-09-11 NOTE — H&P (Signed)
History and Physical    John [name] [id number] DOB: April 15, 1930 DOA: 09/11/2019  Referring MD/NP/PA: Dr. Roderic Palau  PCP: Patient, No Pcp Per  Patient coming from: Home  Chief Complaint: Shortness of breath on exertion, general malaise, fatigue and fever.  HPI: John [name] is a 86 y.o. male with a past medical history significant for chronic kidney disease a stage IIIb, coronary artery disease status post STEMI with PCI and CABG, chronic ischemic cardiomyopathy with systolic heart failure, essential hypertension, BPH and thrombocytopenia; who presented to the emergency department secondary to fever, general malaise, decreased appetite, fatigue and shortness of breath on exertion.  Patient reports symptom has been present for the last 3-4 days and worsening.  He denies any chest pain, no nausea, no vomiting, no dysuria, no hematuria, no melena, no abdominal pain, focal weakness or any other complaints.  In the ED work-up demonstrated chest x-ray with multifocal infiltrates, soft blood pressure with a rapidly response to IV fluid resuscitation, low-grade temperature, elevated respiratory rate, lactic acid of 2.5, findings of acute on chronic renal failure with a creatinine of 1.78 and elevated WBCs at 13.8.  Covid test came back to be positive and inflammatory markers are also corroborates acute infection.  Blood cultures taken, IV antibiotics given and [facility] contacted today patient for further evaluation and management of sepsis due to COVID-19 pneumonia with concern for superimposed bacterial component.  Past Medical/Surgical History: Past Medical History:  Diagnosis Date  . Acute ST elevation myocardial infarction (STEMI) of posterior wall ([place]) 11/11/2015  . CKD (chronic kidney disease), stage III   . Coronary atherosclerosis of native coronary artery    a. Multivessel status post CABG in 1992. b. STEMI 11/2015 s/p DES to insertion site of LIMA-LAD.  [name] Dilated aortic root ([place])   .  Essential hypertension   . Ischemic cardiomyopathy   . Malignant melanoma of skin   . Mixed hyperlipidemia   . Nephrolithiasis   . OSA (obstructive sleep apnea)   . Rosacea   . Thrombocytopenia ([place])     Past Surgical History:  Procedure Laterality Date  . CARDIAC CATHETERIZATION  07/11/90  . CARDIAC CATHETERIZATION N/A 11/11/2015   Procedure: Left Heart Cath and Coronary Angiography;  Surgeon: [name], MD;  Location: [facility];  Service: Cardiovascular;  Laterality: N/A;  . CARDIAC CATHETERIZATION N/A 11/14/2015   Procedure: Coronary Stent Intervention w/Impella;  Surgeon: [name], MD;  Location: [facility];  Service: Cardiovascular;  Laterality: N/A;  . carotid duplex  06/24/2011, 05/12/2009   Right and Left ICAs: Demonstrates a small amount of irregular mixed density plaque with no evidence of diameter reduction, significant tortuosity or any other vascular abnormality. This is a mildly abnormal carotid duplex doppler evaluation.  [name] CATARACT EXTRACTION W/PHACO Right 02/11/2014   Procedure: CATARACT EXTRACTION PHACO AND INTRAOCULAR LENS PLACEMENT (IOC);  Surgeon: [name], MD;  Location: [facility];  Service: Ophthalmology;  Laterality: Right;  CDE 13.83  . CHOLECYSTECTOMY    . CORONARY ARTERY BYPASS GRAFT  1992  . DOPPLER ECHOCARDIOGRAPHY  05/23/2007   Left ventricular systolic function is normal. The transmitral spectral doppler flow pattern is suggestive of impaired LV relaxation. No significant valvular abnormalities.  . heart monitor  04/01/2009   palpitations  . HERNIA REPAIR    . MELANOMA EXCISION     neck  . NM MYOCAR PERF EJECTION FRACTION  02/10/2011   The post stress myocaardial perfusion images show a normal pattern of perfusion in all  regions. No significant wall motion abnormalities noted. EKG is negative for ischemia. This is a low risk scan.  [name] TOTAL KNEE ARTHROPLASTY      Social History:  reports that he quit smoking about 38 years ago. His  smoking use included cigarettes. He started smoking about 76 years ago. He has never used smokeless tobacco. He reports that he does not drink alcohol or use drugs.  Allergies: Allergies  Allergen Reactions  . Statins Other (See Comments)    Weak,nervous  . Tape Rash    Family History:  Family History  Problem Relation Age of Onset  . Heart attack Father   . Skin cancer Brother   . Heart attack Brother     Prior to Admission medications   Medication Sig Start Date End Date Taking? Authorizing Provider  acetaminophen (TYLENOL) 325 MG tablet Take 2 tablets (650 mg total) by mouth every 4 (four) hours as needed for headache or mild pain. 11/16/15  Yes Erlene Quan, PA-C  aspirin EC 81 MG tablet Take 81 mg by mouth daily.   Yes [provider]  clopidogrel (PLAVIX) 75 MG tablet Take 1 tablet by mouth once daily with breakfast 07/31/19  Yes [name], MD  clotrimazole-betamethasone (LOTRISONE) cream Apply 1 application topically 2 (two) times daily.    Yes [provider]  fludrocortisone (FLORINEF) 0.1 MG tablet Take 100 mcg by mouth daily. 05/22/19  Yes [provider]  furosemide (LASIX) 20 MG tablet Take 1 tablet (20 mg total) by mouth daily. 05/28/19  Yes [name], MD  ibuprofen (ADVIL) 400 MG tablet Take 400 mg by mouth every 6 (six) hours as needed.   Yes [provider]  metoprolol succinate (TOPROL-XL) 25 MG 24 hr tablet Take 1 tablet by mouth twice daily 07/19/19  Yes [name], MD  neomycin-polymyxin b-dexamethasone (MAXITROL) 3.5-10000-0.1 [place] 1 application into the left eye daily.  07/10/19  Yes [provider]  nitroGLYCERIN (NITROSTAT) 0.4 MG SL tablet Place 1 tablet (0.4 mg total) under the tongue every 5 (five) minutes x 3 doses as needed for chest pain. 11/16/15  Yes Kilroy, Luke K, PA-C  pantoprazole (PROTONIX) 40 MG tablet Take 40 mg by mouth daily. 10/29/13  Yes [provider]    potassium chloride SA (KLOR-CON M20) 20 MEQ tablet Take 2 tablets (40 mEq total) by mouth daily. Patient taking differently: Take 20 mEq by mouth daily.  06/28/19 09/26/19 Yes [name], MD  sacubitril-valsartan (ENTRESTO) 24-26 MG Take 1 tablet by mouth 2 (two) times daily. 06/28/19  Yes [name], MD  tamsulosin (FLOMAX) 0.4 MG CAPS capsule Take 0.4 mg by mouth daily. 08/06/19  Yes [provider]    Review of Systems:  Negative except as otherwise mentioned in HPI.   Physical Exam: Vitals:   09/11/19 1330 09/11/19 1400 09/11/19 1430 09/11/19 1509  BP: (!) 123/57 (!) 110/47 (!) 124/108 124/70  Pulse: (!) 59 (!) 57 63 (!) 57  Resp: (!) 25 (!) 21 (!) 22 (!) 22  Temp:    97.6 F (36.4 C)  TempSrc:    Oral  SpO2: 98% 97% 98%   Weight:      Height:        Constitutional: Low-grade temperature appreciated; no chest pain, no nausea or vomiting.  Reports feeling weak, fatigued, with decreased appetite and just general malaise in general.  He expressed shortness of breath on exertion.  No orthopnea. Eyes: PERRL, lids  and conjunctivae normal, no icterus, no nystagmus. ENMT: Mucous membranes are dry on exam. Posterior pharynx clear of any exudate or lesions. Neck: normal, supple, no masses, no thyromegaly, no JVD Respiratory: Positive rhonchi, no wheezing, no crackles, no using accessory muscles. Cardiovascular: Regular rate, no rubs, no gallops; No lower extremity edema appreciated. No carotid bruits.  Abdomen: no tenderness, no masses palpated. No hepatosplenomegaly. Bowel sounds positive.  Musculoskeletal: no clubbing / cyanosis. No joint deformity upper and lower extremities. Good ROM, no contractures. Normal muscle tone.  Skin: no rashes, no petechiae. Neurologic: CN 2-12 grossly intact.  Moving 4 limbs spontaneously.  No focal deficit. Psychiatric: Normal judgment and insight. Alert and oriented x 3. Normal mood.    Labs on Admission: I have personally  reviewed the following labs and imaging studies  CBC: Recent Labs  Lab 09/11/19 1100 09/11/19 1219  WBC 13.8* 12.7*  NEUTROABS 12.2* 10.9*  HGB 14.1 12.6*  HCT 42.0 38.1*  MCV 92.3 92.9  PLT 55* 50*   Basic Metabolic Panel: Recent Labs  Lab 09/11/19 1100  NA 136  K 3.4*  CL 100  CO2 24  GLUCOSE 105*  BUN 19  CREATININE 1.78*  CALCIUM 8.5*   GFR: Estimated Creatinine Clearance: 29.3 mL/min (A) (by C-G formula based on SCr of 1.78 mg/dL (H)).   Liver Function Tests: Recent Labs  Lab 09/11/19 1100  AST 109*  ALT 37  ALKPHOS 59  BILITOT 2.2*  PROT 5.8*  ALBUMIN 3.2*   Coagulation Profile: Recent Labs  Lab 09/11/19 1219  INR 1.3*   CBG: Recent Labs  Lab 09/11/19 1048  GLUCAP 116*   Lipid Profile: Recent Labs    09/11/19 1409  TRIG 54   Thyroid Function Tests: Recent Labs    09/11/19 1409  TSH 1.337   Anemia Panel: Recent Labs    09/11/19 1409  FERRITIN 357*  356*   Urine analysis:    Component Value Date/Time   COLORURINE YELLOW 11/28/2009 1120   APPEARANCEUR CLEAR 11/28/2009 1120   LABSPEC 1.017 11/28/2009 1120   PHURINE 6.0 11/28/2009 1120   GLUCOSEU NEGATIVE 11/28/2009 1120   HGBUR NEGATIVE 11/28/2009 1120   BILIRUBINUR NEGATIVE 11/28/2009 1120   KETONESUR NEGATIVE 11/28/2009 1120   PROTEINUR NEGATIVE 11/28/2009 1120   UROBILINOGEN 0.2 11/28/2009 1120   NITRITE NEGATIVE 11/28/2009 1120   LEUKOCYTESUR  11/28/2009 1120    NEGATIVE MICROSCOPIC NOT DONE ON URINES WITH NEGATIVE PROTEIN, BLOOD, LEUKOCYTES, NITRITE, OR GLUCOSE <1000 mg/dL.    Recent Results (from the past 240 hour(s))  Respiratory Panel by RT PCR (Flu A&B, Covid) - Nasopharyngeal Swab     Status: Abnormal   Collection Time: 09/11/19 11:01 AM   Specimen: Nasopharyngeal Swab  Result Value Ref Range Status   SARS Coronavirus 2 by RT PCR POSITIVE (A) NEGATIVE Final    Comment: RESULT CALLED TO, READ BACK BY AND VERIFIED WITH: CAGLE J. AT 1316 ON TV:[id number] BY THOMPSON  S. (NOTE) SARS-CoV-2 target nucleic acids are DETECTED. SARS-CoV-2 RNA is generally detectable in upper respiratory specimens  during the acute phase of infection. Positive results are indicative of the presence of the identified virus, but do not rule out bacterial infection or co-infection with other pathogens not detected by the test. Clinical correlation with patient history and other diagnostic information is necessary to determine patient infection status. The expected result is Negative. Fact Sheet for Patients:  PinkCheek.be Fact Sheet for Healthcare Providers: GravelBags.it This test is not yet approved or cleared by  the [employer] and  has been authorized for detection and/or diagnosis of SARS-CoV-2 by FDA under an Emergency Use Authorization (EUA).  This EUA will remain in effect (meaning this test can be u sed) for the duration of  the COVID-19 declaration under Section 564(b)(1) of the Act, 21 U.S.C. section 360bbb-3(b)(1), unless the authorization is terminated or revoked sooner.    Influenza A by PCR NEGATIVE NEGATIVE Final   Influenza B by PCR NEGATIVE NEGATIVE Final    Comment: (NOTE) The Xpert Xpress SARS-CoV-2/FLU/RSV assay is intended as an aid in  the diagnosis of influenza from Nasopharyngeal swab specimens and  should not be used as a sole basis for treatment. Nasal washings and  aspirates are unacceptable for Xpert Xpress SARS-CoV-2/FLU/RSV  testing. Fact Sheet for Patients: PinkCheek.be Fact Sheet for Healthcare Providers: GravelBags.it This test is not yet approved or cleared by the [place] FDA and  has been authorized for detection and/or diagnosis of SARS-CoV-2 by  FDA under an Emergency Use Authorization (EUA). This EUA will remain  in effect (meaning this test can be used) for the duration of the  Covid-19 declaration under  Section 564(b)(1) of the Act, 21  U.S.C. section 360bbb-3(b)(1), unless the authorization is  terminated or revoked. Performed at [facility], [address], [place], [place] [address]   Blood Culture (routine x 2)     Status: None (Preliminary result)   Collection Time: 09/11/19 12:19 PM   Specimen: BLOOD LEFT ARM  Result Value Ref Range Status   Specimen Description BLOOD LEFT ARM  Final   Special Requests   Final    BOTTLES DRAWN AEROBIC AND ANAEROBIC Blood Culture adequate volume Performed at [facility], [address], [place], [place] [address]    Culture PENDING  Incomplete   Report Status PENDING  Incomplete  Blood Culture (routine x 2)     Status: None (Preliminary result)   Collection Time: 09/11/19 12:19 PM   Specimen: BLOOD  Result Value Ref Range Status   Specimen Description BLOOD LEFT ANTECUBITAL  Final   Special Requests   Final    BOTTLES DRAWN AEROBIC ONLY Blood Culture adequate volume Performed at [facility], [address] [place], [place] [address]    Culture PENDING  Incomplete   Report Status PENDING  Incomplete     Radiological Exams on Admission: DG Chest Port 1 View  Result Date: 09/11/2019 CLINICAL DATA:  Fever, weakness for several days, hypotensive EXAM: PORTABLE CHEST 1 VIEW COMPARISON:  11/11/2015 FINDINGS: Single frontal view of the chest demonstrates stable enlargement of the cardiac silhouette. Stable postsurgical changes from CABG. Mild ectasia of the thoracic aorta with atherosclerosis of the aortic arch again noted. There is multifocal bilateral airspace disease, greatest in the left lower lobe. No large effusion or pneumothorax. No acute bony abnormality. IMPRESSION: 1. Multifocal bilateral airspace disease greatest in the left lower lobe. Favor multifocal infection over edema. Electronically Signed   By: Randa Ngo M.D.   On: 09/11/2019 12:11    EKG: Independently reviewed.  No signs of acute ischemic changes.  Normal axis.  Borderline  QT prolongation.  Sinus rhythm.  Assessment/Plan 1-sepsis secondary to Covid 19 pneumonia -Patient meeting sepsis criteria on admission with elevated WBCs, elevated temperature, elevated respiratory rate, positive COVID-19 test and multifocal infiltrates appreciated on his chest x-ray.  Lactic acid elevated at 2.5 and acute on chronic renal failure for organ dysfunction appreciated. -Fluid resuscitation initiated in the ED -Blood cultures taken -Inflammatory  markers demonstrated an elevated procalcitonin, CRP, ferritin and D-dimer. -Per protocol will initiate treatment with IV steroids and remdesivir per pharmacy -Also will provide IV antibiotics to cover presume superimposed bacterial component -Currently no hypoxic at rest; closely follow oxygen saturation -Provide supportive care and follow inflammatory markers on daily basis. -As needed antipyretics and antitussives has been ordered.  2-elevated troponin -No chest pain -Appears to be demand ischemia and in the setting of acute on chronic renal failure -Continue metoprolol, aspirin and Plavix -At this moment no changes appreciated on his EKG to suggest ischemia.  3-chronic systolic heart failure -Compensated and currently slightly dehydrated -Gentle judicious fluid resuscitation initiated -Follow daily weights and strict I's and O's -Holding Entresto and diuretics initially. -Recent echocardiogram demonstrated an ejection fraction 20-25%.  4-gastroesophageal reflux disease -Continue PPI.  5-essential hypertension -Presenting with soft blood pressure the rapid response to IV fluids in the ED -Will hold antihypertensive agents except for metoprolol -Follow vital signs.  6-history of BPH -Continue Flomax.   7-acute on chronic renal failure: Stage IIIb at baseline- -appears to be in the setting of prerenal azotemia from dehydration, increase insensitive losses and continue use of nephrotoxic agents -Continue to avoid/minimize  nephrotoxic agents -Avoid hypotension and follow renal function trend -Urinalysis ordered -Follow renal function trend. -Continue gentle fluid resuscitation.  8-thrombocytopenia -Platelets count in the 50,000 range; appears to be chronic and is stable from previous values -No signs of operative bleeding -Holding heparin products currently -SCDs for DVT prophylaxis.  DVT prophylaxis: SCDs. Code Status: Full code. Family Communication: No family at bedside. Disposition Plan: Anticipate discharge back home once sepsis due to COVID-19 pneumonia status stabilized and renal function back to baseline.  Consults called: None Admission status: Inpatient, MedSurg, length of stay more than 2 midnights.  Patient high risk for decompensation in the setting of sepsis from acute COVID-19 pneumonia infection; he had elevated CRP, D-dimer and procalcitonin suggesting possibility of superimposed bacterial component.  Will provide IV steroids, initiation of remdesivir therapy along with IV antibiotics to cover multifocal CAP.  Patient also demonstrating acute on chronic renal failure, in the setting of prerenal azotemia.  Avoiding nephrotoxic agents, hypotension and providing gentle monitored fluid resuscitation.   Time Spent: 70 minutes  Barton Dubois MD [facility] Pager [phone]   09/11/2019, 4:20 PM

## 2019-09-11 NOTE — ED Triage Notes (Signed)
Pt brought to ED via [facility] for weakness x couple of days. Pt with systolic BP in [id number]. BP here 100/58. Pt exposed to covid.

## 2019-09-11 NOTE — ED Notes (Signed)
ED TO INPATIENT HANDOFF REPORT  ED Nurse Name and Phone #: [phone]  S Name/Age/Gender John [name] 86 y.o. male Room/Bed: APA12/APA12  Code Status   Code Status: Full Code  Home/SNF/Other Home Patient oriented to: self, place, time and situation Is this baseline? Yes   Triage Complete: Triage complete  Chief Complaint Pneumonia due to COVID-19 virus [U07.1, J12.82]  Triage Note Pt brought to ED via [facility] for weakness x couple of days. Pt with systolic BP in [id number]. BP here 100/58. Pt exposed to covid.     Allergies Allergies  Allergen Reactions  . Statins Other (See Comments)    Weak,nervous  . Tape Rash    Level of Care/Admitting Diagnosis ED Disposition    ED Disposition Condition [facility] Area: [facility] [id number]  Level of Care: Med-Surg [16]  Covid Evaluation: Confirmed COVID Positive  Diagnosis: Pneumonia due to COVID-19 virus [id number]  Admitting Physician: Barton Dubois [[id number]]  Attending Physician: Barton Dubois [[id number]]  Estimated length of stay: past midnight tomorrow  Certification:: I certify this patient will need inpatient services for at least 2 midnights       B Medical/Surgery History Past Medical History:  Diagnosis Date  . Acute ST elevation myocardial infarction (STEMI) of posterior wall ([place]) 11/11/2015  . CKD (chronic kidney disease), stage III   . Coronary atherosclerosis of native coronary artery    a. Multivessel status post CABG in 1992. b. STEMI 11/2015 s/p DES to insertion site of LIMA-LAD.  [name] Dilated aortic root ([place])   . Essential hypertension   . Ischemic cardiomyopathy   . Malignant melanoma of skin   . Mixed hyperlipidemia   . Nephrolithiasis   . OSA (obstructive sleep apnea)   . Rosacea   . Thrombocytopenia ([place])    Past Surgical History:  Procedure Laterality Date  . CARDIAC CATHETERIZATION  07/11/90  . CARDIAC CATHETERIZATION N/A 11/11/2015   Procedure: Left Heart Cath and Coronary  Angiography;  Surgeon: [name], MD;  Location: [facility];  Service: Cardiovascular;  Laterality: N/A;  . CARDIAC CATHETERIZATION N/A 11/14/2015   Procedure: Coronary Stent Intervention w/Impella;  Surgeon: [name], MD;  Location: [facility];  Service: Cardiovascular;  Laterality: N/A;  . carotid duplex  06/24/2011, 05/12/2009   Right and Left ICAs: Demonstrates a small amount of irregular mixed density plaque with no evidence of diameter reduction, significant tortuosity or any other vascular abnormality. This is a mildly abnormal carotid duplex doppler evaluation.  [name] CATARACT EXTRACTION W/PHACO Right 02/11/2014   Procedure: CATARACT EXTRACTION PHACO AND INTRAOCULAR LENS PLACEMENT (IOC);  Surgeon: [name], MD;  Location: [facility];  Service: Ophthalmology;  Laterality: Right;  CDE 13.83  . CHOLECYSTECTOMY    . CORONARY ARTERY BYPASS GRAFT  1992  . DOPPLER ECHOCARDIOGRAPHY  05/23/2007   Left ventricular systolic function is normal. The transmitral spectral doppler flow pattern is suggestive of impaired LV relaxation. No significant valvular abnormalities.  . heart monitor  04/01/2009   palpitations  . HERNIA REPAIR    . MELANOMA EXCISION     neck  . NM MYOCAR PERF EJECTION FRACTION  02/10/2011   The post stress myocaardial perfusion images show a normal pattern of perfusion in all regions. No significant wall motion abnormalities noted. EKG is negative for ischemia. This is a low risk scan.  [name] TOTAL KNEE ARTHROPLASTY       A IV Location/Drains/Wounds Patient Lines/Drains/Airways Status   Active Line/Drains/Airways  Name:   Placement date:   Placement time:   Site:   Days:   Peripheral IV 09/11/19 Right Hand   09/11/19    1036    Hand   less than 1   Incision (Closed) 02/11/14 Eye Right   02/11/14    1212     2038          Intake/Output Last 24 hours  Intake/Output Summary (Last 24 hours) at 09/11/2019 1357 Last data filed at 09/11/2019 1339 Gross per 24 hour   Intake 2100 ml  Output -  Net 2100 ml    Labs/Imaging Results for orders placed or performed during the hospital encounter of 09/11/19 (from the past 48 hour(s))  CBG monitoring, ED     Status: Abnormal   Collection Time: 09/11/19 10:48 AM  Result Value Ref Range   Glucose-Capillary 116 (H) 70 - 99 mg/dL    Comment: Glucose reference range applies only to samples taken after fasting for at least 8 hours.  Basic metabolic panel     Status: Abnormal   Collection Time: 09/11/19 11:00 AM  Result Value Ref Range   Sodium 136 135 - 145 mmol/L   Potassium 3.4 (L) 3.5 - 5.1 mmol/L   Chloride 100 98 - 111 mmol/L   CO2 24 22 - 32 mmol/L   Glucose, Bld 105 (H) 70 - 99 mg/dL    Comment: Glucose reference range applies only to samples taken after fasting for at least 8 hours.   BUN 19 8 - 23 mg/dL   Creatinine, Ser 1.78 (H) 0.61 - 1.24 mg/dL   Calcium 8.5 (L) 8.9 - 10.3 mg/dL   GFR calc non Af Amer 34 (L) >60 mL/min   GFR calc Af Amer 39 (L) >60 mL/min   Anion gap 12 5 - 15    Comment: Performed at [facility], [address] [place], [place] [address]  CBC     Status: Abnormal   Collection Time: 09/11/19 11:00 AM  Result Value Ref Range   WBC 13.8 (H) 4.0 - 10.5 K/uL   RBC 4.55 4.22 - 5.81 MIL/uL   Hemoglobin 14.1 13.0 - 17.0 g/dL   HCT 42.0 39.0 - 52.0 %   MCV 92.3 80.0 - 100.0 fL   MCH 31.0 26.0 - 34.0 pg   MCHC 33.6 30.0 - 36.0 g/dL   RDW 14.4 11.5 - 15.5 %   Platelets 55 (L) 150 - 400 K/uL    Comment: PLATELET COUNT CONFIRMED BY SMEAR SPECIMEN CHECKED FOR CLOTS Immature Platelet Fraction may be clinically indicated, consider ordering this additional test [id number]    nRBC 0.0 0.0 - 0.2 %    Comment: Performed at [facility], [address], [place], [place] [address]  Hepatic function panel     Status: Abnormal   Collection Time: 09/11/19 11:00 AM  Result Value Ref Range   Total Protein 5.8 (L) 6.5 - 8.1 g/dL   Albumin 3.2 (L) 3.5 - 5.0 g/dL   AST 109 (H) 15 - 41 U/L    ALT 37 0 - 44 U/L   Alkaline Phosphatase 59 38 - 126 U/L   Total Bilirubin 2.2 (H) 0.3 - 1.2 mg/dL   Bilirubin, Direct 0.5 (H) 0.0 - 0.2 mg/dL   Indirect Bilirubin 1.7 (H) 0.3 - 0.9 mg/dL    Comment: Performed at [facility], [address], [place],  [address]  Troponin I (High Sensitivity)     Status: Abnormal   Collection Time: 09/11/19  11:00 AM  Result Value Ref Range   Troponin I (High Sensitivity) 110 (HH) <18 ng/L    Comment: CRITICAL RESULT CALLED TO, READ BACK BY AND VERIFIED WITH: Kainan Patty,H AT 11:45AM ON 09/11/19 BY FESTERMAN,C (NOTE) Elevated high sensitivity troponin I (hsTnI) values and significant  changes across serial measurements may suggest ACS but many other  chronic and acute conditions are known to elevate hsTnI results.  Refer to the Links section for chest pain algorithms and additional  guidance. Performed at [facility], [address], [place], [place] [address]   Lactate dehydrogenase     Status: Abnormal   Collection Time: 09/11/19 11:00 AM  Result Value Ref Range   LDH 232 (H) 98 - 192 U/L    Comment: Performed at [facility], [address] [place], [place] [address]  Differential     Status: Abnormal   Collection Time: 09/11/19 11:00 AM  Result Value Ref Range   Neutrophils Relative % 88 %   Neutro Abs 12.2 (H) 1.7 - 7.7 K/uL   Lymphocytes Relative 8 %   Lymphs Abs 1.2 0.7 - 4.0 K/uL   Monocytes Relative 3 %   Monocytes Absolute 0.4 0.1 - 1.0 K/uL   Eosinophils Relative 0 %   Eosinophils Absolute 0.0 0.0 - 0.5 K/uL   Basophils Relative 0 %   Basophils Absolute 0.0 0.0 - 0.1 K/uL   Immature Granulocytes 1 %   Abs Immature Granulocytes 0.16 (H) 0.00 - 0.07 K/uL   Reactive, Benign Lymphocytes PRESENT     Comment: Performed at [facility], [address] [place], [place] [address]  Respiratory Panel by RT PCR (Flu A&B, Covid) - Nasopharyngeal Swab     Status: Abnormal   Collection Time: 09/11/19 11:01 AM   Specimen: Nasopharyngeal  Swab  Result Value Ref Range   SARS Coronavirus 2 by RT PCR POSITIVE (A) NEGATIVE    Comment: RESULT CALLED TO, READ BACK BY AND VERIFIED WITH: CAGLE J. AT [id number] ON [id number] BY THOMPSON S. (NOTE) SARS-CoV-2 target nucleic acids are DETECTED. SARS-CoV-2 RNA is generally detectable in upper respiratory specimens  during the acute phase of infection. Positive results are indicative of the presence of the identified virus, but do not rule out bacterial infection or co-infection with other pathogens not detected by the test. Clinical correlation with patient history and other diagnostic information is necessary to determine patient infection status. The expected result is Negative. Fact Sheet for Patients:  PinkCheek.be Fact Sheet for Healthcare Providers: GravelBags.it This test is not yet approved or cleared by the [place] FDA and  has been authorized for detection and/or diagnosis of SARS-CoV-2 by FDA under an Emergency Use Authorization (EUA).  This EUA will remain in effect (meaning this test can be u sed) for the duration of  the COVID-19 declaration under Section 564(b)(1) of the Act, 21 U.S.C. section 360bbb-3(b)(1), unless the authorization is terminated or revoked sooner.    Influenza A by PCR NEGATIVE NEGATIVE   Influenza B by PCR NEGATIVE NEGATIVE    Comment: (NOTE) The Xpert Xpress SARS-CoV-2/FLU/RSV assay is intended as an aid in  the diagnosis of influenza from Nasopharyngeal swab specimens and  should not be used as a sole basis for treatment. Nasal washings and  aspirates are unacceptable for Xpert Xpress SARS-CoV-2/FLU/RSV  testing. Fact Sheet for Patients: PinkCheek.be Fact Sheet for Healthcare Providers: GravelBags.it This test is not yet approved or cleared by the [place] FDA and  has been authorized for detection  and/or diagnosis of  SARS-CoV-2 by  FDA under an Emergency Use Authorization (EUA). This EUA will remain  in effect (meaning this test can be used) for the duration of the  Covid-19 declaration under Section 564(b)(1) of the Act, 21  U.S.C. section 360bbb-3(b)(1), unless the authorization is  terminated or revoked. Performed at [facility], [address], [place], [place] [address]   Lactic acid, plasma     Status: Abnormal   Collection Time: 09/11/19 12:19 PM  Result Value Ref Range   Lactic Acid, Venous 2.5 (HH) 0.5 - 1.9 mmol/L    Comment: CRITICAL RESULT CALLED TO, READ BACK BY AND VERIFIED WITH: Enrico Eaddy,H AT 1323 ON 3.9.21 BY ISLEY,B Performed at [facility], [address] [place], [place] [address]   CBC WITH DIFFERENTIAL     Status: Abnormal   Collection Time: 09/11/19 12:19 PM  Result Value Ref Range   WBC 12.7 (H) 4.0 - 10.5 K/uL   RBC 4.10 (L) 4.22 - 5.81 MIL/uL   Hemoglobin 12.6 (L) 13.0 - 17.0 g/dL   HCT 38.1 (L) 39.0 - 52.0 %   MCV 92.9 80.0 - 100.0 fL   MCH 30.7 26.0 - 34.0 pg   MCHC 33.1 30.0 - 36.0 g/dL   RDW 14.4 11.5 - 15.5 %   Platelets 50 (L) 150 - 400 K/uL    Comment: PLATELET COUNT CONFIRMED BY SMEAR SPECIMEN CHECKED FOR CLOTS Immature Platelet Fraction may be clinically indicated, consider ordering this additional test [id number]    nRBC 0.0 0.0 - 0.2 %   Neutrophils Relative % 87 %   Neutro Abs 10.9 (H) 1.7 - 7.7 K/uL   Lymphocytes Relative 9 %   Lymphs Abs 1.2 0.7 - 4.0 K/uL   Monocytes Relative 3 %   Monocytes Absolute 0.4 0.1 - 1.0 K/uL   Eosinophils Relative 0 %   Eosinophils Absolute 0.0 0.0 - 0.5 K/uL   Basophils Relative 0 %   Basophils Absolute 0.0 0.0 - 0.1 K/uL   WBC Morphology INCREASED BANDS (>20% BANDS)    Immature Granulocytes 1 %   Abs Immature Granulocytes 0.14 (H) 0.00 - 0.07 K/uL   Reactive, Benign Lymphocytes PRESENT    Polychromasia PRESENT     Comment: Performed at [facility], [address], [place], [place] [address]  APTT     Status:  Abnormal   Collection Time: 09/11/19 12:19 PM  Result Value Ref Range   aPTT 39 (H) 24 - 36 seconds    Comment:        IF BASELINE aPTT IS ELEVATED, SUGGEST PATIENT RISK ASSESSMENT BE USED TO DETERMINE APPROPRIATE ANTICOAGULANT THERAPY. Performed at [facility], [address] [place], [place] [address]   Protime-INR     Status: Abnormal   Collection Time: 09/11/19 12:19 PM  Result Value Ref Range   Prothrombin Time 15.6 (H) 11.4 - 15.2 seconds   INR 1.3 (H) 0.8 - 1.2    Comment: (NOTE) INR goal varies based on device and disease states. Performed at [facility], [address], [place], [place] [address]   Brain natriuretic peptide     Status: Abnormal   Collection Time: 09/11/19  1:09 PM  Result Value Ref Range   B Natriuretic Peptide 1,832.0 (H) 0.0 - 100.0 pg/mL    Comment: Performed at [facility], [address] [place],  [address]   DG Chest Port 1 View  Result Date: 09/11/2019 CLINICAL DATA:  Fever, weakness for several days, hypotensive EXAM: PORTABLE  CHEST 1 VIEW COMPARISON:  11/11/2015 FINDINGS: Single frontal view of the chest demonstrates stable enlargement of the cardiac silhouette. Stable postsurgical changes from CABG. Mild ectasia of the thoracic aorta with atherosclerosis of the aortic arch again noted. There is multifocal bilateral airspace disease, greatest in the left lower lobe. No large effusion or pneumothorax. No acute bony abnormality. IMPRESSION: 1. Multifocal bilateral airspace disease greatest in the left lower lobe. Favor multifocal infection over edema. Electronically Signed   By: Randa Ngo M.D.   On: 09/11/2019 12:11    Pending Labs Unresulted Labs (From admission, onward)    Start     Ordered   09/12/19 0500  Comprehensive metabolic panel  Daily,   R     09/11/19 1355   09/12/19 0500  C-reactive protein  Daily,   R     09/11/19 1355   09/12/19 0500  D-dimer, quantitative (not at [facility])  Daily,   R     09/11/19 1355   09/12/19 0500   Magnesium  Daily,   R     09/11/19 1355   09/12/19 0500  Ferritin  Daily,   R     09/11/19 1355   09/12/19 0500  Phosphorus  Daily,   R     09/11/19 1355   09/11/19 1355  VITAMIN D 25 Hydroxy (Vit-D Deficiency, Fractures)  Once,   STAT     09/11/19 1355   09/11/19 1355  TSH  Once,   STAT     09/11/19 1355   09/11/19 1351  C-reactive protein  Once,   STAT     09/11/19 1355   09/11/19 1351  D-dimer, quantitative (not at [facility])  Once,   STAT     09/11/19 1355   09/11/19 1351  Ferritin  Once,   STAT     09/11/19 1355   09/11/19 1351  Procalcitonin  Once,   STAT     09/11/19 1355   09/11/19 1349  ABO/Rh  Once,   STAT     09/11/19 1355   09/11/19 1346  Triglycerides  ONCE - STAT,   STAT     09/11/19 1345   09/11/19 1346  Ferritin (Iron Binding Protein)  ONCE - STAT,   STAT     09/11/19 1345   09/11/19 1346  Procalcitonin  Once,   STAT     09/11/19 1345   09/11/19 1346  D-dimer, quantitative (not at [facility])  Once,   STAT     09/11/19 1345   09/11/19 1345  C-reactive protein  Once,   STAT     09/11/19 1345   09/11/19 1345  Fibrinogen  ONCE - STAT,   STAT     09/11/19 1345   09/11/19 1142  Lactic acid, plasma  Now then every 2 hours,   STAT     09/11/19 1142   09/11/19 1142  Blood Culture (routine x 2)  BLOOD CULTURE X 2,   STAT     09/11/19 1142   09/11/19 1142  Urine culture  ONCE - STAT,   STAT     09/11/19 1142   09/11/19 1034  Urinalysis, Routine w reflex microscopic  Once,   STAT     09/11/19 1033          Vitals/Pain Today's Vitals   09/11/19 1200 09/11/19 1243 09/11/19 1300 09/11/19 1330  BP: (!) 100/57 (!) 112/57 107/61 (!) 123/57  Pulse: 63 (!) 58 (!) 58 (!) 59  Resp: 20 (!) 120 20 (!)  25  Temp:      TempSrc:      SpO2: 98% 99% 97% 98%  Weight:      Height:      PainSc:        Isolation Precautions Airborne and Contact precautions  Medications Medications  sodium chloride flush (NS) 0.9 % injection 3 mL (has no administration in time range)  0.9 %   sodium chloride infusion (has no administration in time range)  sodium chloride flush (NS) 0.9 % injection 3 mL (has no administration in time range)  sodium chloride flush (NS) 0.9 % injection 3 mL (has no administration in time range)  0.9 %  sodium chloride infusion (has no administration in time range)  remdesivir 200 mg in sodium chloride 0.9% 250 mL IVPB (has no administration in time range)    Followed by  remdesivir 100 mg in sodium chloride 0.9 % 100 mL IVPB (has no administration in time range)  Ipratropium-Albuterol (COMBIVENT) respimat 1 puff (has no administration in time range)  dexamethasone (DECADRON) injection 6 mg (has no administration in time range)  guaiFENesin-dextromethorphan (ROBITUSSIN DM) 100-10 MG/5ML syrup 10 mL (has no administration in time range)  acetaminophen (TYLENOL) tablet 650 mg (has no administration in time range)  ondansetron (ZOFRAN) tablet 4 mg (has no administration in time range)    Or  ondansetron (ZOFRAN) injection 4 mg (has no administration in time range)  ascorbic acid (VITAMIN C) tablet 500 mg (has no administration in time range)  zinc sulfate capsule 220 mg (has no administration in time range)  sodium chloride 0.9 % bolus 1,000 mL (0 mLs Intravenous Stopped 09/11/19 1242)  sodium chloride 0.9 % bolus 500 mL (0 mLs Intravenous Stopped 09/11/19 1242)  levofloxacin (LEVAQUIN) IVPB 500 mg (0 mg Intravenous Stopped 09/11/19 1339)    Mobility walks Moderate fall risk   Focused Assessments    R Recommendations: See Admitting Provider Note  Report given to:   Additional Notes:

## 2019-09-12 DIAGNOSIS — J1282 Pneumonia due to coronavirus disease 2019: Secondary | ICD-10-CM

## 2019-09-12 DIAGNOSIS — U071 COVID-19: Secondary | ICD-10-CM

## 2019-09-12 LAB — COMPREHENSIVE METABOLIC PANEL
ALT: 46 U/L — ABNORMAL HIGH (ref 0–44)
AST: 138 U/L — ABNORMAL HIGH (ref 15–41)
Albumin: 3 g/dL — ABNORMAL LOW (ref 3.5–5.0)
Alkaline Phosphatase: 52 U/L (ref 38–126)
Anion gap: 12 (ref 5–15)
BUN: 20 mg/dL (ref 8–23)
CO2: 24 mmol/L (ref 22–32)
Calcium: 8.6 mg/dL — ABNORMAL LOW (ref 8.9–10.3)
Chloride: 106 mmol/L (ref 98–111)
Creatinine, Ser: 1.3 mg/dL — ABNORMAL HIGH (ref 0.61–1.24)
GFR calc Af Amer: 57 mL/min — ABNORMAL LOW (ref >60)
GFR calc non Af Amer: 49 mL/min — ABNORMAL LOW (ref >60)
Glucose, Bld: 100 mg/dL — ABNORMAL HIGH (ref 70–99)
Potassium: 3.6 mmol/L (ref 3.5–5.1)
Sodium: 142 mmol/L (ref 135–145)
Total Bilirubin: 1.9 mg/dL — ABNORMAL HIGH (ref 0.3–1.2)
Total Protein: 5.8 g/dL — ABNORMAL LOW (ref 6.5–8.1)

## 2019-09-12 LAB — PROCALCITONIN: Procalcitonin: 0.36 ng/mL

## 2019-09-12 LAB — D-DIMER, QUANTITATIVE: D-Dimer, Quant: 6.73 ug/mL-FEU — ABNORMAL HIGH (ref 0.00–0.50)

## 2019-09-12 LAB — MAGNESIUM: Magnesium: 1.9 mg/dL (ref 1.7–2.4)

## 2019-09-12 LAB — C-REACTIVE PROTEIN: CRP: 10.2 mg/dL — ABNORMAL HIGH (ref <1.0)

## 2019-09-12 LAB — PHOSPHORUS: Phosphorus: 3.3 mg/dL (ref 2.5–4.6)

## 2019-09-12 LAB — FERRITIN: Ferritin: 386 ng/mL — ABNORMAL HIGH (ref 24–336)

## 2019-09-12 MED ORDER — CHLORHEXIDINE GLUCONATE CLOTH 2 % EX PADS
6.0000 | MEDICATED_PAD | Freq: Every day | CUTANEOUS | Status: DC
  Administered 2019-09-12 – 2019-09-16 (×5): 6 via TOPICAL

## 2019-09-12 MED ORDER — TRAZODONE HCL 50 MG PO TABS
50.0000 mg | ORAL_TABLET | Freq: Every evening | ORAL | Status: DC | PRN
  Administered 2019-09-12 – 2019-09-15 (×3): 50 mg via ORAL
  Filled 2019-09-12 (×4): qty 1

## 2019-09-12 MED ORDER — LORAZEPAM 2 MG/ML IJ SOLN
1.0000 mg | Freq: Once | INTRAMUSCULAR | Status: AC
  Administered 2019-09-12: 1 mg via INTRAVENOUS
  Filled 2019-09-12: qty 1

## 2019-09-12 NOTE — Progress Notes (Signed)
Only 151mls of urine noted.  Bladder scan performed showing >600 mls.  Dr. Darrick Meigs notified.  Patient states he doesn't feel the urge to void.  Attempted to have pt void without success. Dr. Darrick Meigs notified.  Order for foley catheter received.  83f foley catheter placed without meeting resistance.  625 mls of clear, dark yellow urine returned.

## 2019-09-12 NOTE — Progress Notes (Signed)
Ardith Dark notified that patient is confused, disoriented and continues pulling his telemetry leads off and attempting to get out of bed, which is unsafe for the patient.  Patient was up all night last night and per report from day shift RN up all day today.

## 2019-09-12 NOTE — Progress Notes (Signed)
PROGRESS NOTE    John [name]  [id number] DOB: August 07, 1929 DOA: 09/11/2019 PCP: Patient, No Pcp Per   Brief Narrative:  Per HPI: John [name] is a 86 y.o. male with a past medical history significant for chronic kidney disease a stage IIIb, coronary artery disease status post STEMI with PCI and CABG, chronic ischemic cardiomyopathy with systolic heart failure, essential hypertension, BPH and thrombocytopenia; who presented to the emergency department secondary to fever, general malaise, decreased appetite, fatigue and shortness of breath on exertion.  Patient reports symptom has been present for the last 3-4 days and worsening.  He denies any chest pain, no nausea, no vomiting, no dysuria, no hematuria, no melena, no abdominal pain, focal weakness or any other complaints.  In the ED work-up demonstrated chest x-ray with multifocal infiltrates, soft blood pressure with a rapidly response to IV fluid resuscitation, low-grade temperature, elevated respiratory rate, lactic acid of 2.5, findings of acute on chronic renal failure with a creatinine of 1.78 and elevated WBCs at 13.8.  Covid test came back to be positive and inflammatory markers are also corroborates acute infection.  Blood cultures taken, IV antibiotics given and TR[facility] contacted today patient for further evaluation and management of sepsis due to COVID-19 pneumonia with concern for superimposed bacterial component.  3/10: Patient was admitted with sepsis secondary to COVID-19 pneumonia with concerns for superimposed bacterial component.  He has remained on azithromycin and Rocephin as well as remdesivir and steroids and continues to have some elevation in inflammatory markers, but is not hypoxemic at rest.  Will need to continue to monitor progression with labs.   Assessment & Plan:   Principal Problem:   Pneumonia due to COVID-19 virus Active Problems:   CAD in native artery   Essential hypertension, benign   Sepsis  ([place])   1-sepsis secondary to Covid 19 pneumonia -Patient meeting sepsis criteria on admission with elevated WBCs, elevated temperature, elevated respiratory rate, positive COVID-19 test and multifocal infiltrates appreciated on his chest x-ray.  Lactic acid elevated at 2.5 and acute on chronic renal failure for organ dysfunction appreciated. -Fluid resuscitation initiated in the ED -Blood cultures taken -Inflammatory markers demonstrated an elevated procalcitonin, CRP, ferritin and D-dimer. -Per protocol will initiate treatment with IV steroids and remdesivir per pharmacy -Also will provide IV antibiotics to cover presume superimposed bacterial component -Currently no hypoxic at rest; closely follow oxygen saturation -Provide supportive care and follow inflammatory markers on daily basis. -As needed antipyretics and antitussives has been ordered.  2-elevated troponin -No chest pain -Appears to be demand ischemia and in the setting of acute on chronic renal failure -Continue metoprolol, aspirin and Plavix -At this moment no changes appreciated on his EKG to suggest ischemia.  3-chronic systolic heart failure -Compensated and currently slightly dehydrated -Gentle judicious fluid resuscitation initiated -Follow daily weights and strict I's and O's -Holding Entresto and diuretics initially. -Recent echocardiogram demonstrated an ejection fraction 20-25%.  4-gastroesophageal reflux disease -Continue PPI.  5-essential hypertension-controlled -Presenting with soft blood pressure the rapid response to IV fluids in the ED -Will hold antihypertensive agents except for metoprolol -Follow vital signs.  6-history of BPH -Continue Flomax.   7-acute on chronic renal failure: Stage IIIb at baseline-improving -appears to be in the setting of prerenal azotemia from dehydration, increase insensitive losses and continue use of nephrotoxic agents -Continue to avoid/minimize nephrotoxic  agents -Avoid hypotension and follow renal function trend -Urinalysis ordered -Follow renal function trend.  8-thrombocytopenia -Platelets count in the 50,000 range; appears  to be chronic and is stable from previous values -No signs of operative bleeding -Holding heparin products currently -SCDs for DVT prophylaxis.   DVT prophylaxis: SCDs Code Status: Full code Family Communication: Plan to call family Disposition Plan: Continue on current treatment plan and follow labs.   Consultants:   None  Procedures:   See Below  Antimicrobials:  Anti-infectives (From admission, onward)   Start     Dose/Rate Route Frequency Ordered Stop   09/12/19 1000  remdesivir 100 mg in sodium chloride 0.9 % 100 mL IVPB  Status:  Discontinued     100 mg 200 mL/hr over 30 Minutes Intravenous Daily 09/11/19 1355 09/11/19 1357   09/12/19 1000  remdesivir 100 mg in sodium chloride 0.9 % 100 mL IVPB     100 mg 200 mL/hr over 30 Minutes Intravenous Daily 09/11/19 1358 09/16/19 0959   09/12/19 0700  cefTRIAXone (ROCEPHIN) 2 g in sodium chloride 0.9 % 100 mL IVPB     2 g 200 mL/hr over 30 Minutes Intravenous Every 24 hours 09/11/19 1649     09/12/19 0600  azithromycin (ZITHROMAX) 500 mg in sodium chloride 0.9 % 250 mL IVPB     500 mg 250 mL/hr over 60 Minutes Intravenous Every 24 hours 09/11/19 1639     09/11/19 1630  cefTRIAXone (ROCEPHIN) 2 g in sodium chloride 0.9 % 100 mL IVPB  Status:  Discontinued     2 g 200 mL/hr over 30 Minutes Intravenous Every 24 hours 09/11/19 1629 09/11/19 1649   09/11/19 1630  azithromycin (ZITHROMAX) 500 mg in sodium chloride 0.9 % 250 mL IVPB  Status:  Discontinued     500 mg 250 mL/hr over 60 Minutes Intravenous Every 24 hours 09/11/19 1629 09/11/19 1639   09/11/19 1400  remdesivir 200 mg in sodium chloride 0.9% 250 mL IVPB  Status:  Discontinued     200 mg 580 mL/hr over 30 Minutes Intravenous Once 09/11/19 1355 09/11/19 1357   09/11/19 1400  remdesivir 100 mg in  sodium chloride 0.9 % 100 mL IVPB     100 mg 200 mL/hr over 30 Minutes Intravenous Every 1 hr x 2 09/11/19 1358 09/11/19 1559   09/11/19 1145  levofloxacin (LEVAQUIN) IVPB 500 mg     500 mg 100 mL/hr over 60 Minutes Intravenous  Once 09/11/19 1143 09/11/19 1339       Subjective: Patient seen and evaluated today with no new acute complaints or concerns. No acute concerns or events noted overnight.  He is hard of hearing, but appears to deny any new complaints.  Objective: Vitals:   09/12/19 0415 09/12/19 0827 09/12/19 0915 09/12/19 1423  BP: 114/69   (!) 152/75  Pulse: (!) 58  62 67  Resp: 20   20  Temp: (!) 97.5 F (36.4 C)   98.1 F (36.7 C)  TempSrc: Axillary   Oral  SpO2: 98% 96%    Weight:      Height:        Intake/Output Summary (Last 24 hours) at 09/12/2019 1439 Last data filed at 09/12/2019 1000 Gross per 24 hour  Intake 2481.2 ml  Output 100 ml  Net 2381.2 ml   Filed Weights   09/11/19 1032  Weight: 81.2 kg    Examination:  General exam: Appears calm and comfortable, hard of hearing Respiratory system: Clear to auscultation. Respiratory effort normal.  Currently on room air. Cardiovascular system: S1 & S2 heard, RRR. No JVD, murmurs, rubs, gallops or clicks. No pedal edema.  Gastrointestinal system: Abdomen is nondistended, soft and nontender. No organomegaly or masses felt. Normal bowel sounds heard. Central nervous system: Alert and awake Extremities: No edema Skin: No rashes, lesions or ulcers Psychiatry: Difficult to assess    Data Reviewed: I have personally reviewed following labs and imaging studies  CBC: Recent Labs  Lab 09/11/19 1100 09/11/19 1219  WBC 13.8* 12.7*  NEUTROABS 12.2* 10.9*  HGB 14.1 12.6*  HCT 42.0 38.1*  MCV 92.3 92.9  PLT 55* 50*   Basic Metabolic Panel: Recent Labs  Lab 09/11/19 1100 09/12/19 0428  NA 136 142  K 3.4* 3.6  CL 100 106  CO2 24 24  GLUCOSE 105* 100*  BUN 19 20  CREATININE 1.78* 1.30*  CALCIUM  8.5* 8.6*  MG  --  1.9  PHOS  --  3.3   GFR: Estimated Creatinine Clearance: 40.1 mL/min (A) (by C-G formula based on SCr of 1.3 mg/dL (H)). Liver Function Tests: Recent Labs  Lab 09/11/19 1100 09/12/19 0428  AST 109* 138*  ALT 37 46*  ALKPHOS 59 52  BILITOT 2.2* 1.9*  PROT 5.8* 5.8*  ALBUMIN 3.2* 3.0*   No results for input(s): LIPASE, AMYLASE in the last 168 hours. No results for input(s): AMMONIA in the last 168 hours. Coagulation Profile: Recent Labs  Lab 09/11/19 1219  INR 1.3*   Cardiac Enzymes: No results for input(s): CKTOTAL, CKMB, CKMBINDEX, TROPONINI in the last 168 hours. BNP (last 3 results) No results for input(s): PROBNP in the last 8760 hours. HbA1C: No results for input(s): HGBA1C in the last 72 hours. CBG: Recent Labs  Lab 09/11/19 1048  GLUCAP 116*   Lipid Profile: Recent Labs    09/11/19 1409  TRIG 54   Thyroid Function Tests: Recent Labs    09/11/19 1409  TSH 1.337   Anemia Panel: Recent Labs    09/11/19 1409 09/12/19 0428  FERRITIN 357*  356* 386*   Sepsis Labs: Recent Labs  Lab 09/11/19 1219 09/11/19 1409  PROCALCITON  --  0.54  LATICACIDVEN 2.5* 1.8    Recent Results (from the past 240 hour(s))  Respiratory Panel by RT PCR (Flu A&B, Covid) - Nasopharyngeal Swab     Status: Abnormal   Collection Time: 09/11/19 11:01 AM   Specimen: Nasopharyngeal Swab  Result Value Ref Range Status   SARS Coronavirus 2 by RT PCR POSITIVE (A) NEGATIVE Final    Comment: RESULT CALLED TO, READ BACK BY AND VERIFIED WITH: CAGLE J. AT 1316 ON TV:[id number] BY THOMPSON S. (NOTE) SARS-CoV-2 target nucleic acids are DETECTED. SARS-CoV-2 RNA is generally detectable in upper respiratory specimens  during the acute phase of infection. Positive results are indicative of the presence of the identified virus, but do not rule out bacterial infection or co-infection with other pathogens not detected by the test. Clinical correlation with patient history  and other diagnostic information is necessary to determine patient infection status. The expected result is Negative. Fact Sheet for Patients:  PinkCheek.be Fact Sheet for Healthcare Providers: GravelBags.it This test is not yet approved or cleared by the [place] FDA and  has been authorized for detection and/or diagnosis of SARS-CoV-2 by FDA under an Emergency Use Authorization (EUA).  This EUA will remain in effect (meaning this test can be u sed) for the duration of  the COVID-19 declaration under Section 564(b)(1) of the Act, 21 U.S.C. section 360bbb-3(b)(1), unless the authorization is terminated or revoked sooner.    Influenza A by PCR NEGATIVE NEGATIVE Final  Influenza B by PCR NEGATIVE NEGATIVE Final    Comment: (NOTE) The Xpert Xpress SARS-CoV-2/FLU/RSV assay is intended as an aid in  the diagnosis of influenza from Nasopharyngeal swab specimens and  should not be used as a sole basis for treatment. Nasal washings and  aspirates are unacceptable for Xpert Xpress SARS-CoV-2/FLU/RSV  testing. Fact Sheet for Patients: PinkCheek.be Fact Sheet for Healthcare Providers: GravelBags.it This test is not yet approved or cleared by the [place] FDA and  has been authorized for detection and/or diagnosis of SARS-CoV-2 by  FDA under an Emergency Use Authorization (EUA). This EUA will remain  in effect (meaning this test can be used) for the duration of the  Covid-19 declaration under Section 564(b)(1) of the Act, 21  U.S.C. section 360bbb-3(b)(1), unless the authorization is  terminated or revoked. Performed at [facility], [address], [place], [place] [address]   Blood Culture (routine x 2)     Status: None (Preliminary result)   Collection Time: 09/11/19 12:19 PM   Specimen: BLOOD LEFT ARM  Result Value Ref Range Status   Specimen Description BLOOD  LEFT ARM  Final   Special Requests   Final    BOTTLES DRAWN AEROBIC AND ANAEROBIC Blood Culture adequate volume   Culture   Final    NO GROWTH < 24 HOURS Performed at [facility], [address], [place], [place] [address]    Report Status PENDING  Incomplete  Blood Culture (routine x 2)     Status: None (Preliminary result)   Collection Time: 09/11/19 12:19 PM   Specimen: BLOOD  Result Value Ref Range Status   Specimen Description BLOOD LEFT ANTECUBITAL  Final   Special Requests   Final    BOTTLES DRAWN AEROBIC ONLY Blood Culture adequate volume   Culture   Final    NO GROWTH < 24 HOURS Performed at [facility], [address], [place], [place] [address]    Report Status PENDING  Incomplete         Radiology Studies: DG Chest Port 1 View  Result Date: 09/11/2019 CLINICAL DATA:  Fever, weakness for several days, hypotensive EXAM: PORTABLE CHEST 1 VIEW COMPARISON:  11/11/2015 FINDINGS: Single frontal view of the chest demonstrates stable enlargement of the cardiac silhouette. Stable postsurgical changes from CABG. Mild ectasia of the thoracic aorta with atherosclerosis of the aortic arch again noted. There is multifocal bilateral airspace disease, greatest in the left lower lobe. No large effusion or pneumothorax. No acute bony abnormality. IMPRESSION: 1. Multifocal bilateral airspace disease greatest in the left lower lobe. Favor multifocal infection over edema. Electronically Signed   By: Randa Ngo M.D.   On: 09/11/2019 12:11        Scheduled Meds: . vitamin C  500 mg Oral Daily  . aspirin EC  81 mg Oral Daily  . Chlorhexidine Gluconate Cloth  6 each Topical Daily  . clopidogrel  75 mg Oral Daily  . dexamethasone (DECADRON) injection  6 mg Intravenous Q24H  . Ipratropium-Albuterol  1 puff Inhalation TID  . metoprolol succinate  25 mg Oral BID  . pantoprazole  40 mg Oral Daily  . tamsulosin  0.4 mg Oral Daily  . zinc sulfate  220 mg Oral Daily   Continuous  Infusions: . azithromycin 500 mg (09/12/19 0500)  . cefTRIAXone (ROCEPHIN)  IV 2 g (09/12/19 0630)  . remdesivir 100 mg in NS 100 mL 100 mg (09/12/19 0933)     LOS: 1 day  Time spent: 30 minutes    Maurya Nethery Darleen Crocker, DO [facility] Pager [phone]  If 7PM-7AM, please contact night-coverage [email or web address] Password [facility] 09/12/2019, 2:39 PM

## 2019-09-13 LAB — CBC
HCT: 41.2 % (ref 39.0–52.0)
Hemoglobin: 13.8 g/dL (ref 13.0–17.0)
MCH: 30.8 pg (ref 26.0–34.0)
MCHC: 33.5 g/dL (ref 30.0–36.0)
MCV: 92 fL (ref 80.0–100.0)
Platelets: 64 10*3/uL — ABNORMAL LOW (ref 150–400)
RBC: 4.48 MIL/uL (ref 4.22–5.81)
RDW: 14.3 % (ref 11.5–15.5)
WBC: 7 10*3/uL (ref 4.0–10.5)
nRBC: 0 % (ref 0.0–0.2)

## 2019-09-13 LAB — MAGNESIUM: Magnesium: 2.1 mg/dL (ref 1.7–2.4)

## 2019-09-13 LAB — URINALYSIS, ROUTINE W REFLEX MICROSCOPIC
Bilirubin Urine: NEGATIVE
Glucose, UA: NEGATIVE mg/dL
Ketones, ur: NEGATIVE mg/dL
Leukocytes,Ua: NEGATIVE
Nitrite: NEGATIVE
Protein, ur: NEGATIVE mg/dL
Specific Gravity, Urine: 1.013 (ref 1.005–1.030)
pH: 6 (ref 5.0–8.0)

## 2019-09-13 LAB — COMPREHENSIVE METABOLIC PANEL
ALT: 47 U/L — ABNORMAL HIGH (ref 0–44)
AST: 109 U/L — ABNORMAL HIGH (ref 15–41)
Albumin: 2.9 g/dL — ABNORMAL LOW (ref 3.5–5.0)
Alkaline Phosphatase: 48 U/L (ref 38–126)
Anion gap: 9 (ref 5–15)
BUN: 23 mg/dL (ref 8–23)
CO2: 23 mmol/L (ref 22–32)
Calcium: 8.3 mg/dL — ABNORMAL LOW (ref 8.9–10.3)
Chloride: 108 mmol/L (ref 98–111)
Creatinine, Ser: 1.13 mg/dL (ref 0.61–1.24)
GFR calc Af Amer: >60 mL/min (ref >60)
GFR calc non Af Amer: 58 mL/min — ABNORMAL LOW (ref >60)
Glucose, Bld: 102 mg/dL — ABNORMAL HIGH (ref 70–99)
Potassium: 3.6 mmol/L (ref 3.5–5.1)
Sodium: 140 mmol/L (ref 135–145)
Total Bilirubin: 1.3 mg/dL — ABNORMAL HIGH (ref 0.3–1.2)
Total Protein: 5.7 g/dL — ABNORMAL LOW (ref 6.5–8.1)

## 2019-09-13 LAB — D-DIMER, QUANTITATIVE: D-Dimer, Quant: 7.42 ug/mL-FEU — ABNORMAL HIGH (ref 0.00–0.50)

## 2019-09-13 LAB — FERRITIN: Ferritin: 376 ng/mL — ABNORMAL HIGH (ref 24–336)

## 2019-09-13 LAB — C-REACTIVE PROTEIN: CRP: 6.2 mg/dL — ABNORMAL HIGH (ref <1.0)

## 2019-09-13 LAB — PROCALCITONIN: Procalcitonin: 0.28 ng/mL

## 2019-09-13 LAB — PHOSPHORUS: Phosphorus: 3.4 mg/dL (ref 2.5–4.6)

## 2019-09-13 MED ORDER — HALOPERIDOL LACTATE 5 MG/ML IJ SOLN: 2.0000 mg | Freq: Four times a day (QID) | INTRAMUSCULAR | Status: DC | PRN

## 2019-09-13 NOTE — Progress Notes (Signed)
   09/13/19 2152  Provider Notification  Provider Name/Title M. Sharlet Salina  Date Provider Notified 09/13/19  Time Provider Notified 2152  Notification Type Page  Notification Reason Other (Comment) (HR-[id number], ask if to hold metoprolol)   Provider notified this RN to hold metoprolol at this time.

## 2019-09-13 NOTE — Progress Notes (Addendum)
Patient has not had non-violent wrist restraints in place during this shift. MD made aware and will discontinue the order. Tele-monitoring safety observation put in place per MD order for continued monitoring.

## 2019-09-13 NOTE — Plan of Care (Signed)
  Problem: Acute Rehab PT Goals(only PT should resolve) Goal: Pt Will Go Supine/Side To Sit Outcome: Progressing Flowsheets (Taken 09/13/2019 1051) Pt will go Supine/Side to Sit: with minimal assist Goal: Patient Will Transfer Sit To/From Stand Outcome: Progressing Flowsheets (Taken 09/13/2019 1051) Patient will transfer sit to/from stand:  with minimal assist  with moderate assist Goal: Pt Will Transfer Bed To Chair/Chair To Bed Outcome: Progressing Flowsheets (Taken 09/13/2019 1051) Pt will Transfer Bed to Chair/Chair to Bed:  with min assist  with mod assist Goal: Pt Will Ambulate Outcome: Progressing Flowsheets (Taken 09/13/2019 1051) Pt will Ambulate:  25 feet  with moderate assist  with rolling walker   10:52 AM, 09/13/19 Lonell Grandchild, MPT Physical Therapist with [facility] [phone] office [phone] mobile phone

## 2019-09-13 NOTE — Evaluation (Addendum)
Physical Therapy Evaluation Patient Details Name: John [name] MRN: [id number] DOB: [birth year] Today's Date: 09/13/2019   History of Present Illness  John [name] is a 86 y.o. male with a past medical history significant for chronic kidney disease a stage IIIb, coronary artery disease status post STEMI with PCI and CABG, chronic ischemic cardiomyopathy with systolic heart failure, essential hypertension, BPH and thrombocytopenia; who presented to the emergency department secondary to fever, general malaise, decreased appetite, fatigue and shortness of breath on exertion.  Patient reports symptom has been present for the last 3-4 days and worsening.  He denies any chest pain, no nausea, no vomiting, no dysuria, no hematuria, no melena, no abdominal pain, focal weakness or any other complaints.    Clinical Impression  Patient very [place] and requires Max verbal/tactile cueing to follow directions.  Patient has difficulty sitting up at bedside requiring much time and Mod/max assist, limited to a few steps at bedside due to poor standing balance using RW with severe fall risk and able to transfer to [facility] for a bowel movement.  Patient tolerated sitting up in chair after therapy - RN aware.  Patient will benefit from continued physical therapy in hospital and recommended venue below to increase strength, balance, endurance for safe ADLs and gait.     Follow Up Recommendations SNF    Equipment Recommendations  None recommended by PT    Recommendations for Other Services       Precautions / Restrictions Precautions Precautions: Fall Restrictions Weight Bearing Restrictions: No      Mobility  Bed Mobility Overal bed mobility: Needs Assistance Bed Mobility: Supine to Sit     Supine to sit: Mod assist;Max assist     General bed mobility comments: slow labored movement with frequent verbal/tactile cueing  Transfers Overall transfer level: Needs assistance Equipment used: Rolling  walker (2 wheeled) Transfers: Sit to/from [facility] Sit to Stand: Mod assist Stand pivot transfers: Mod assist       General transfer comment: slow labored movement, very unsteady on feet  Ambulation/Gait Ambulation/Gait assistance: Mod assist;Max assist Gait Distance (Feet): 4 Feet Assistive device: Rolling walker (2 wheeled) Gait Pattern/deviations: Decreased step length - right;Decreased step length - left;Decreased stride length Gait velocity: slow   General Gait Details: limited to 4-5 very unsteady labored side steps at bedside due to fall risk/weakness  Stairs            Wheelchair Mobility    Modified Rankin (Stroke Patients Only)       Balance Overall balance assessment: Needs assistance Sitting-balance support: Feet supported;No upper extremity supported Sitting balance-Leahy Scale: Fair Sitting balance - Comments: fair/good seated at EOB   Standing balance support: During functional activity;Bilateral upper extremity supported Standing balance-Leahy Scale: Poor Standing balance comment: using RW                             Pertinent Vitals/Pain Pain Assessment: No/denies pain    Home Living Family/patient expects to be discharged to:: Private residence Living Arrangements: Other relatives Available Help at Discharge: Family;Available PRN/intermittently Type of Home: House         Home Equipment: Walker - 2 wheels;Bedside commode;Shower seat;Cane - single point Additional Comments: Patient very hard of hearing and a poor historian    Prior Function Level of Independence: Independent with assistive device(s)         Comments: household ambulator with RW     Hand  Dominance        Extremity/Trunk Assessment   Upper Extremity Assessment Upper Extremity Assessment: Generalized weakness    Lower Extremity Assessment Lower Extremity Assessment: Generalized weakness    Cervical / Trunk  Assessment Cervical / Trunk Assessment: Normal  Communication   Communication: HOH  Cognition Arousal/Alertness: Awake/alert Behavior During Therapy: WFL for tasks assessed/performed Overall Cognitive Status: No family/caregiver present to determine baseline cognitive functioning                                        General Comments      Exercises     Assessment/Plan    PT Assessment Patient needs continued PT services  PT Problem List Decreased strength;Decreased activity tolerance;Decreased balance;Decreased mobility       PT Treatment Interventions Gait training;Functional mobility training;Therapeutic activities;Therapeutic exercise;Stair training;Patient/family education    PT Goals (Current goals can be found in the Care Plan section)  Acute Rehab PT Goals Patient Stated Goal: return home PT Goal Formulation: With patient Time For Goal Achievement: 09/27/19 Potential to Achieve Goals: Good    Frequency Min 3X/week   Barriers to discharge        Co-evaluation               AM-PAC PT "6 Clicks" Mobility  Outcome Measure Help needed turning from your back to your side while in a flat bed without using bedrails?: A Lot Help needed moving from lying on your back to sitting on the side of a flat bed without using bedrails?: A Lot Help needed moving to and from a bed to a chair (including a wheelchair)?: A Lot Help needed standing up from a chair using your arms (e.g., wheelchair or bedside chair)?: A Lot Help needed to walk in hospital room?: A Lot Help needed climbing 3-5 steps with a railing? : Total 6 Click Score: 11    End of Session   Activity Tolerance: Patient tolerated treatment well;Patient limited by fatigue Patient left: in chair;with call bell/phone within reach;with chair alarm set Nurse Communication: Mobility status PT Visit Diagnosis: Unsteadiness on feet (R26.81);Other abnormalities of gait and mobility (R26.89);Muscle  weakness (generalized) (M62.81)    Time: [id number] PT Time Calculation (min) (ACUTE ONLY): 32 min   Charges:   PT Evaluation $PT Eval Moderate Complexity: 1 Mod PT Treatments $Therapeutic Activity: 23-37 mins        10:50 AM, 09/13/19 Lonell Grandchild, MPT Physical Therapist with [facility] [phone] office [phone] mobile phone

## 2019-09-13 NOTE — Progress Notes (Signed)
PROGRESS NOTE    John [name]  [id number] DOB: 1929/09/17 DOA: 09/11/2019 PCP: Patient, No Pcp Per   Brief Narrative:  Per HPI: Findley G Donathanis a 86 y.o.malewith a past medical history significant for chronic kidney disease a stage IIIb, coronary artery disease status post STEMI with PCI and CABG,chronic ischemic cardiomyopathy with systolic heart failure, essential hypertension, BPHandthrombocytopenia;who presented to the emergency department secondary to fever, general malaise, decreased appetite, fatigue and shortness of breath on exertion. Patient reports symptom has been present for the last 3-4 days and worsening. He denies any chest pain, no nausea, no vomiting, no dysuria, no hematuria, no melena, no abdominal pain, focal weakness or any other complaints.  In the ED work-up demonstrated chest x-ray with multifocal infiltrates, soft blood pressure with a rapidly response to IV fluid resuscitation, low-grade temperature, elevated respiratory rate, lactic acid of 2.5, findings of acute on chronic renal failure with a creatinine of 1.78 and elevated WBCs at 13.8. Covid test came back to be positive and inflammatory markers are also corroborates acute infection. Blood cultures taken, IV antibiotics given and [facility] contacted today patient for further evaluation and management of sepsis due to COVID-19 pneumonia with concern for superimposed bacterial component.  3/10: Patient was admitted with sepsis secondary to COVID-19 pneumonia with concerns for superimposed bacterial component.  He has remained on azithromycin and Rocephin as well as remdesivir and steroids and continues to have some elevation in inflammatory markers, but is not hypoxemic at rest.  Will need to continue to monitor progression with labs.  3/11: Patient noted to have agitation overnight and now has mitts in place.  He still appears quite confused.  He received Ativan with some assistance in his agitation.   Will order Haldol and sitter at bedside for further observation.  Inflammatory markers are downtrending.  Assessment & Plan:   Principal Problem:   Pneumonia due to COVID-19 virus Active Problems:   CAD in native artery   Essential hypertension, benign   Sepsis ([place])   1-sepsis secondary to Covid 19 pneumonia-improving -Patient meeting sepsis criteria on admission with elevated WBCs, elevated temperature, elevated respiratory rate, positive COVID-19 test and multifocal infiltrates appreciated on his chest x-ray. Lactic acid elevated at 2.5 and acute on chronic renal failure for organ dysfunction appreciated. -Fluid resuscitation initiated in the ED -Blood cultures taken -Inflammatory markers are downtrending -Per protocol will initiate treatment with IV steroids and remdesivir per pharmacy -Alsowill provideIV antibiotics to coverpresumesuperimposedbacterialcomponent -Currently not hypoxic at rest; closely follow oxygen saturation -Provide supportive care and follow inflammatory markers on daily basis. -As needed antipyretics and antitussives has been ordered.  2-elevated troponin -No chest pain -Appears to be demand ischemia and in the setting of acute on chronic renal failure -Continue metoprolol, aspirin and Plavix -At this moment no changes appreciated on his EKG to suggest ischemia.  3-chronic systolic heart failure -Compensated and currently slightly dehydrated -Gentle judicious fluid resuscitation initiated -Follow daily weights and strict I's and O's -Holding Entresto and diuretics initially. -Recent echocardiogram demonstrated an ejection fraction 20-25%.  4-gastroesophageal reflux disease -Continue PPI.  5-essential hypertension-controlled -Presenting with soft blood pressure the rapid response to IV fluids in the ED -Will hold antihypertensive agents except for metoprolol -Follow vital signs.  6-history of BPH -Continue Flomax.  7-acute on chronic  renal failure: Stage IIIb at baseline-improving -appears to be in the setting of prerenal azotemia from dehydration, increase insensitive losses and continue use of nephrotoxic agents -Continue to avoid/minimize nephrotoxic agents -Avoid hypotension  and follow renal function trend -Urinalysisordered -Follow renal function trend-improved  8-thrombocytopenia -Currently stable -Holding heparin products currently -SCDs for DVT prophylaxis.  9-agitation/delirium -Likely related to steroid use -Sitter at bedside as well as Haldol -Plan to hold further Decadron at this point and see if patient will improve he is not hypoxemic and his inflammatory markers are decreasing  10-weakness/deconditioning -Plan for SNF on discharge   DVT prophylaxis: SCDs Code Status: Full code Family Communication: Plan to call family Disposition Plan: Continue on current treatment plan and follow labs.  Patient still has agitation/delirium for which steroids have been discontinued.  Will need to follow-up for 1-2 days for further improvement until he can go to SNF per PT recommendations.   Consultants:   None  Procedures:   See Below  Antimicrobials:  Anti-infectives (From admission, onward)   Start     Dose/Rate Route Frequency Ordered Stop   09/12/19 1000  remdesivir 100 mg in sodium chloride 0.9 % 100 mL IVPB  Status:  Discontinued     100 mg 200 mL/hr over 30 Minutes Intravenous Daily 09/11/19 1355 09/11/19 1357   09/12/19 1000  remdesivir 100 mg in sodium chloride 0.9 % 100 mL IVPB     100 mg 200 mL/hr over 30 Minutes Intravenous Daily 09/11/19 1358 09/16/19 0959   09/12/19 0700  cefTRIAXone (ROCEPHIN) 2 g in sodium chloride 0.9 % 100 mL IVPB     2 g 200 mL/hr over 30 Minutes Intravenous Every 24 hours 09/11/19 1649     09/12/19 0600  azithromycin (ZITHROMAX) 500 mg in sodium chloride 0.9 % 250 mL IVPB     500 mg 250 mL/hr over 60 Minutes Intravenous Every 24 hours 09/11/19 1639      09/11/19 1630  cefTRIAXone (ROCEPHIN) 2 g in sodium chloride 0.9 % 100 mL IVPB  Status:  Discontinued     2 g 200 mL/hr over 30 Minutes Intravenous Every 24 hours 09/11/19 1629 09/11/19 1649   09/11/19 1630  azithromycin (ZITHROMAX) 500 mg in sodium chloride 0.9 % 250 mL IVPB  Status:  Discontinued     500 mg 250 mL/hr over 60 Minutes Intravenous Every 24 hours 09/11/19 1629 09/11/19 1639   09/11/19 1400  remdesivir 200 mg in sodium chloride 0.9% 250 mL IVPB  Status:  Discontinued     200 mg 580 mL/hr over 30 Minutes Intravenous Once 09/11/19 1355 09/11/19 1357   09/11/19 1400  remdesivir 100 mg in sodium chloride 0.9 % 100 mL IVPB     100 mg 200 mL/hr over 30 Minutes Intravenous Every 1 hr x 2 09/11/19 1358 09/11/19 1559   09/11/19 1145  levofloxacin (LEVAQUIN) IVPB 500 mg     500 mg 100 mL/hr over 60 Minutes Intravenous  Once 09/11/19 1143 09/11/19 1339       Subjective: Patient seen and evaluated today and much history could not be obtained.  He was noted to be somewhat agitated overnight and pulling at his IV lines.  He has mitts in place and received some Ativan.  Objective: Vitals:   09/12/19 2247 09/12/19 2250 09/13/19 0446 09/13/19 1051  BP: (!) 159/78  (!) 144/78 128/68  Pulse: (!) 54  (!) 57 (!) 57  Resp: 16  16 20   Temp: (!) 97.4 F (36.3 C)  97.6 F (36.4 C) 98 F (36.7 C)  TempSrc: Axillary  Oral Oral  SpO2: 90% 97% 98% 92%  Weight:      Height:  Intake/Output Summary (Last 24 hours) at 09/13/2019 1105 Last data filed at 09/13/2019 0900 Gross per 24 hour  Intake 560 ml  Output 875 ml  Net -315 ml   Filed Weights   09/11/19 1032  Weight: 81.2 kg    Examination:  General exam: Appears more calm and less agitated Respiratory system: Clear to auscultation. Respiratory effort normal.  Currently on room air. Cardiovascular system: S1 & S2 heard, RRR. No JVD, murmurs, rubs, gallops or clicks. No pedal edema. Gastrointestinal system: Abdomen is  nondistended, soft and nontender. No organomegaly or masses felt. Normal bowel sounds heard. Central nervous system: Alert and arousable Extremities: No edema Skin: No rashes, lesions or ulcers Psychiatry: Difficult to assess given current condition.    Data Reviewed: I have personally reviewed following labs and imaging studies  CBC: Recent Labs  Lab 09/11/19 1100 09/11/19 1219 09/13/19 0514  WBC 13.8* 12.7* 7.0  NEUTROABS 12.2* 10.9*  --   HGB 14.1 12.6* 13.8  HCT 42.0 38.1* 41.2  MCV 92.3 92.9 92.0  PLT 55* 50* 64*   Basic Metabolic Panel: Recent Labs  Lab 09/11/19 1100 09/12/19 0428 09/13/19 0514  NA 136 142 140  K 3.4* 3.6 3.6  CL 100 106 108  CO2 24 24 23   GLUCOSE 105* 100* 102*  BUN 19 20 23   CREATININE 1.78* 1.30* 1.13  CALCIUM 8.5* 8.6* 8.3*  MG  --  1.9 2.1  PHOS  --  3.3 3.4   GFR: Estimated Creatinine Clearance: 46.1 mL/min (by C-G formula based on SCr of 1.13 mg/dL). Liver Function Tests: Recent Labs  Lab 09/11/19 1100 09/12/19 0428 09/13/19 0514  AST 109* 138* 109*  ALT 37 46* 47*  ALKPHOS 59 52 48  BILITOT 2.2* 1.9* 1.3*  PROT 5.8* 5.8* 5.7*  ALBUMIN 3.2* 3.0* 2.9*   No results for input(s): LIPASE, AMYLASE in the last 168 hours. No results for input(s): AMMONIA in the last 168 hours. Coagulation Profile: Recent Labs  Lab 09/11/19 1219  INR 1.3*   Cardiac Enzymes: No results for input(s): CKTOTAL, CKMB, CKMBINDEX, TROPONINI in the last 168 hours. BNP (last 3 results) No results for input(s): PROBNP in the last 8760 hours. HbA1C: No results for input(s): HGBA1C in the last 72 hours. CBG: Recent Labs  Lab 09/11/19 1048  GLUCAP 116*   Lipid Profile: Recent Labs    09/11/19 1409  TRIG 54   Thyroid Function Tests: Recent Labs    09/11/19 1409  TSH 1.337   Anemia Panel: Recent Labs    09/12/19 0428 09/13/19 0514  FERRITIN 386* 376*   Sepsis Labs: Recent Labs  Lab 09/11/19 1219 09/11/19 1409 09/12/19 1448  09/13/19 0514  PROCALCITON  --  0.54 0.36 0.28  LATICACIDVEN 2.5* 1.8  --   --     Recent Results (from the past 240 hour(s))  Respiratory Panel by RT PCR (Flu A&B, Covid) - Nasopharyngeal Swab     Status: Abnormal   Collection Time: 09/11/19 11:01 AM   Specimen: Nasopharyngeal Swab  Result Value Ref Range Status   SARS Coronavirus 2 by RT PCR POSITIVE (A) NEGATIVE Final    Comment: RESULT CALLED TO, READ BACK BY AND VERIFIED WITH: CAGLE J. AT 1316 ON TV:[id number] BY THOMPSON S. (NOTE) SARS-CoV-2 target nucleic acids are DETECTED. SARS-CoV-2 RNA is generally detectable in upper respiratory specimens  during the acute phase of infection. Positive results are indicative of the presence of the identified virus, but do not rule out bacterial infection  or co-infection with other pathogens not detected by the test. Clinical correlation with patient history and other diagnostic information is necessary to determine patient infection status. The expected result is Negative. Fact Sheet for Patients:  PinkCheek.be Fact Sheet for Healthcare Providers: GravelBags.it This test is not yet approved or cleared by the [place] FDA and  has been authorized for detection and/or diagnosis of SARS-CoV-2 by FDA under an Emergency Use Authorization (EUA).  This EUA will remain in effect (meaning this test can be u sed) for the duration of  the COVID-19 declaration under Section 564(b)(1) of the Act, 21 U.S.C. section 360bbb-3(b)(1), unless the authorization is terminated or revoked sooner.    Influenza A by PCR NEGATIVE NEGATIVE Final   Influenza B by PCR NEGATIVE NEGATIVE Final    Comment: (NOTE) The Xpert Xpress SARS-CoV-2/FLU/RSV assay is intended as an aid in  the diagnosis of influenza from Nasopharyngeal swab specimens and  should not be used as a sole basis for treatment. Nasal washings and  aspirates are unacceptable for Xpert Xpress  SARS-CoV-2/FLU/RSV  testing. Fact Sheet for Patients: PinkCheek.be Fact Sheet for Healthcare Providers: GravelBags.it This test is not yet approved or cleared by the [place] FDA and  has been authorized for detection and/or diagnosis of SARS-CoV-2 by  FDA under an Emergency Use Authorization (EUA). This EUA will remain  in effect (meaning this test can be used) for the duration of the  Covid-19 declaration under Section 564(b)(1) of the Act, 21  U.S.C. section 360bbb-3(b)(1), unless the authorization is  terminated or revoked. Performed at [facility], [address] [place], [place] [address]   Blood Culture (routine x 2)     Status: None (Preliminary result)   Collection Time: 09/11/19 12:19 PM   Specimen: BLOOD LEFT ARM  Result Value Ref Range Status   Specimen Description BLOOD LEFT ARM  Final   Special Requests   Final    BOTTLES DRAWN AEROBIC AND ANAEROBIC Blood Culture adequate volume   Culture   Final    NO GROWTH 2 DAYS Performed at [facility], [address] [place], [place] [address]    Report Status PENDING  Incomplete  Blood Culture (routine x 2)     Status: None (Preliminary result)   Collection Time: 09/11/19 12:19 PM   Specimen: BLOOD  Result Value Ref Range Status   Specimen Description BLOOD LEFT ANTECUBITAL  Final   Special Requests   Final    BOTTLES DRAWN AEROBIC ONLY Blood Culture adequate volume   Culture   Final    NO GROWTH 2 DAYS Performed at [facility], [address] [place], [place] [address]    Report Status PENDING  Incomplete         Radiology Studies: DG Chest Port 1 View  Result Date: 09/11/2019 CLINICAL DATA:  Fever, weakness for several days, hypotensive EXAM: PORTABLE CHEST 1 VIEW COMPARISON:  11/11/2015 FINDINGS: Single frontal view of the chest demonstrates stable enlargement of the cardiac silhouette. Stable postsurgical changes from CABG. Mild ectasia of the  thoracic aorta with atherosclerosis of the aortic arch again noted. There is multifocal bilateral airspace disease, greatest in the left lower lobe. No large effusion or pneumothorax. No acute bony abnormality. IMPRESSION: 1. Multifocal bilateral airspace disease greatest in the left lower lobe. Favor multifocal infection over edema. Electronically Signed   By: Randa Ngo M.D.   On: 09/11/2019 12:11        Scheduled Meds: . vitamin C  500  mg Oral Daily  . aspirin EC  81 mg Oral Daily  . Chlorhexidine Gluconate Cloth  6 each Topical Daily  . clopidogrel  75 mg Oral Daily  . dexamethasone (DECADRON) injection  6 mg Intravenous Q24H  . metoprolol succinate  25 mg Oral BID  . pantoprazole  40 mg Oral Daily  . tamsulosin  0.4 mg Oral Daily  . zinc sulfate  220 mg Oral Daily   Continuous Infusions: . azithromycin 500 mg (09/13/19 0449)  . cefTRIAXone (ROCEPHIN)  IV 2 g (09/13/19 0811)  . remdesivir 100 mg in NS 100 mL 100 mg (09/13/19 1030)     LOS: 2 days    Time spent: 30 minutes    Alaiza Yau Darleen Crocker, DO [facility] Pager [phone]  If 7PM-7AM, please contact night-coverage [email or web address] Password [facility] 09/13/2019, 11:05 AM

## 2019-09-13 NOTE — Clinical Social Work Note (Signed)
Message left for patient's daughter requesting return contact to discuss discharge plan.    Geneve Kimpel, Clydene Pugh, LCSW

## 2019-09-14 LAB — CBC
HCT: 42.9 % (ref 39.0–52.0)
Hemoglobin: 14.1 g/dL (ref 13.0–17.0)
MCH: 30.6 pg (ref 26.0–34.0)
MCHC: 32.9 g/dL (ref 30.0–36.0)
MCV: 93.1 fL (ref 80.0–100.0)
Platelets: 66 10*3/uL — ABNORMAL LOW (ref 150–400)
RBC: 4.61 MIL/uL (ref 4.22–5.81)
RDW: 14.6 % (ref 11.5–15.5)
WBC: 4.9 10*3/uL (ref 4.0–10.5)
nRBC: 0 % (ref 0.0–0.2)

## 2019-09-14 LAB — COMPREHENSIVE METABOLIC PANEL
ALT: 42 U/L (ref 0–44)
AST: 70 U/L — ABNORMAL HIGH (ref 15–41)
Albumin: 2.9 g/dL — ABNORMAL LOW (ref 3.5–5.0)
Alkaline Phosphatase: 49 U/L (ref 38–126)
Anion gap: 11 (ref 5–15)
BUN: 25 mg/dL — ABNORMAL HIGH (ref 8–23)
CO2: 23 mmol/L (ref 22–32)
Calcium: 8.5 mg/dL — ABNORMAL LOW (ref 8.9–10.3)
Chloride: 109 mmol/L (ref 98–111)
Creatinine, Ser: 1.27 mg/dL — ABNORMAL HIGH (ref 0.61–1.24)
GFR calc Af Amer: 58 mL/min — ABNORMAL LOW (ref >60)
GFR calc non Af Amer: 50 mL/min — ABNORMAL LOW (ref >60)
Glucose, Bld: 77 mg/dL (ref 70–99)
Potassium: 3.3 mmol/L — ABNORMAL LOW (ref 3.5–5.1)
Sodium: 143 mmol/L (ref 135–145)
Total Bilirubin: 1.4 mg/dL — ABNORMAL HIGH (ref 0.3–1.2)
Total Protein: 5.7 g/dL — ABNORMAL LOW (ref 6.5–8.1)

## 2019-09-14 LAB — MAGNESIUM: Magnesium: 2.1 mg/dL (ref 1.7–2.4)

## 2019-09-14 LAB — PHOSPHORUS: Phosphorus: 2 mg/dL — ABNORMAL LOW (ref 2.5–4.6)

## 2019-09-14 LAB — D-DIMER, QUANTITATIVE: D-Dimer, Quant: 15.43 ug/mL-FEU — ABNORMAL HIGH (ref 0.00–0.50)

## 2019-09-14 LAB — FERRITIN: Ferritin: 294 ng/mL (ref 24–336)

## 2019-09-14 LAB — PROCALCITONIN: Procalcitonin: 0.14 ng/mL

## 2019-09-14 LAB — C-REACTIVE PROTEIN: CRP: 3 mg/dL — ABNORMAL HIGH (ref <1.0)

## 2019-09-14 NOTE — Plan of Care (Signed)
  Problem: Education: Goal: Knowledge of General Education information will improve Description: Including pain rating scale, medication(s)/side effects and non-pharmacologic comfort measures Outcome: Progressing   Problem: Clinical Measurements: Goal: Will remain free from infection Outcome: Progressing   Problem: Clinical Measurements: Goal: Respiratory complications will improve Outcome: Progressing   Problem: Pain Managment: Goal: General experience of comfort will improve Outcome: Progressing   Problem: Safety: Goal: Ability to remain free from injury will improve Outcome: Progressing   Problem: Skin Integrity: Goal: Risk for impaired skin integrity will decrease Outcome: Progressing

## 2019-09-14 NOTE — Progress Notes (Signed)
PROGRESS NOTE    John [name]  [id number] DOB: 1930/05/07 DOA: 09/11/2019 PCP: Patient, No Pcp Per   Brief Narrative:  Per HPI: John [name] a 86 y.o.malewith a past medical history significant for chronic kidney disease a stage IIIb, coronary artery disease status post STEMI with PCI and CABG,chronic ischemic cardiomyopathy with systolic heart failure, essential hypertension, BPHandthrombocytopenia;who presented to the emergency department secondary to fever, general malaise, decreased appetite, fatigue and shortness of breath on exertion. Patient reports symptom has been present for the last 3-4 days and worsening. He denies any chest pain, no nausea, no vomiting, no dysuria, no hematuria, no melena, no abdominal pain, focal weakness or any other complaints.  In the ED work-up demonstrated chest x-ray with multifocal infiltrates, soft blood pressure with a rapidly response to IV fluid resuscitation, low-grade temperature, elevated respiratory rate, lactic acid of 2.5, findings of acute on chronic renal failure with a creatinine of 1.78 and elevated WBCs at 13.8. Covid test came back to be positive and inflammatory markers are also corroborates acute infection. Blood cultures taken, IV antibiotics given and [facility] contacted today patient for further evaluation and management of sepsis due to COVID-19 pneumonia with concern for superimposed bacterial component.  3/10: Patient was admitted with sepsis secondary to COVID-19 pneumonia with concerns for superimposed bacterial component. He has remained on azithromycin and Rocephin as well as remdesivir and steroids and continues to have some elevation in inflammatory markers, but is not hypoxemic at rest. Will need to continue to monitor progression with labs.  3/11: Patient noted to have agitation overnight and now has mitts in place.  He still appears quite confused.  He received Ativan with some assistance in his agitation.   Will order Haldol and sitter at bedside for further observation.  Inflammatory markers are downtrending.  3/12: Patient appears to be improving with less agitation noted.  Plan to finish off remdesivir course while hospitalized.  Family members are refusing SNF and would like for him to come home with home health PT on discharge.  Assessment & Plan:   Principal Problem:   Pneumonia due to COVID-19 virus Active Problems:   CAD in native artery   Essential hypertension, benign   Sepsis ([place])   1-sepsis secondary to Covid 19 pneumonia-improving -Patient meeting sepsis criteria on admission with elevated WBCs, elevated temperature, elevated respiratory rate, positive COVID-19 test and multifocal infiltrates appreciated on his chest x-ray. Lactic acid elevated at 2.5 and acute on chronic renal failure for organ dysfunction appreciated. -Fluid resuscitation initiated in the ED -Blood cultures taken -Inflammatory markers are downtrending -Per protocol will initiate treatment with IV steroids and remdesivir per pharmacy -Alsowill provideIV antibiotics to coverpresumesuperimposedbacterialcomponent -Currently not hypoxic at rest; closely follow oxygen saturation -Provide supportive care and follow inflammatory markers on daily basis. -As needed antipyretics and antitussives has been ordered. -Remdesivir day 4/5  2-elevated troponin -No chest pain -Appears to be demand ischemia and in the setting of acute on chronic renal failure -Continue metoprolol, aspirin and Plavix -At this moment no changes appreciated on his EKG to suggest ischemia.  3-chronic systolic heart failure -Compensated and currently slightly dehydrated -Gentle judicious fluid resuscitation initiated -Follow daily weights and strict I's and O's -Holding Entresto and diuretics initially. -Recent echocardiogram demonstrated an ejection fraction 20-25%.  4-gastroesophageal reflux disease -Continue  PPI.  5-essential hypertension-controlled -Presenting with soft blood pressure the rapid response to IV fluids in the ED -Will hold antihypertensive agents except for metoprolol -Follow vital signs.  6-history  of BPH -Continue Flomax. -Voiding trial today and see if Foley is needed on day of discharge with possible urology follow-up  7-acute on chronic renal failure: Stage IIIb at baseline-improving -appears to be in the setting of prerenal azotemia from dehydration, increase insensitive losses and continue use of nephrotoxic agents -Continue to avoid/minimize nephrotoxic agents -Avoid hypotension and follow renal function trend -Urinalysisordered -Follow renal function trend-improved  8-thrombocytopenia -Currently stable -Holding heparin products currently -SCDs for DVT prophylaxis.  9-agitation/delirium -Likely related to steroid use, hold for now -Sitter at bedside as well as Haldol -Plan to hold further Decadron at this point and see if patient will improve he is not hypoxemic and his inflammatory markers are decreasing  10-weakness/deconditioning -Family members request home health PT on discharge   DVT prophylaxis:SCDs Code Status:Full code Family Communication:Plan to call family Disposition Plan:Continue on current treatment plan and follow labs.    Patient overall doing better.  Voiding trial today and DC Foley.  Plan for discharge to home health PT tomorrow.   Consultants:  None  Procedures:  See Below  Antimicrobials:  Anti-infectives (From admission, onward)   Start     Dose/Rate Route Frequency Ordered Stop   09/12/19 1000  remdesivir 100 mg in sodium chloride 0.9 % 100 mL IVPB  Status:  Discontinued     100 mg 200 mL/hr over 30 Minutes Intravenous Daily 09/11/19 1355 09/11/19 1357   09/12/19 1000  remdesivir 100 mg in sodium chloride 0.9 % 100 mL IVPB     100 mg 200 mL/hr over 30 Minutes Intravenous Daily 09/11/19 1358 09/16/19  0959   09/12/19 0700  cefTRIAXone (ROCEPHIN) 2 g in sodium chloride 0.9 % 100 mL IVPB     2 g 200 mL/hr over 30 Minutes Intravenous Every 24 hours 09/11/19 1649     09/12/19 0600  azithromycin (ZITHROMAX) 500 mg in sodium chloride 0.9 % 250 mL IVPB     500 mg 250 mL/hr over 60 Minutes Intravenous Every 24 hours 09/11/19 1639     09/11/19 1630  cefTRIAXone (ROCEPHIN) 2 g in sodium chloride 0.9 % 100 mL IVPB  Status:  Discontinued     2 g 200 mL/hr over 30 Minutes Intravenous Every 24 hours 09/11/19 1629 09/11/19 1649   09/11/19 1630  azithromycin (ZITHROMAX) 500 mg in sodium chloride 0.9 % 250 mL IVPB  Status:  Discontinued     500 mg 250 mL/hr over 60 Minutes Intravenous Every 24 hours 09/11/19 1629 09/11/19 1639   09/11/19 1400  remdesivir 200 mg in sodium chloride 0.9% 250 mL IVPB  Status:  Discontinued     200 mg 580 mL/hr over 30 Minutes Intravenous Once 09/11/19 1355 09/11/19 1357   09/11/19 1400  remdesivir 100 mg in sodium chloride 0.9 % 100 mL IVPB     100 mg 200 mL/hr over 30 Minutes Intravenous Every 1 hr x 2 09/11/19 1358 09/11/19 1559   09/11/19 1145  levofloxacin (LEVAQUIN) IVPB 500 mg     500 mg 100 mL/hr over 60 Minutes Intravenous  Once 09/11/19 1143 09/11/19 1339       Subjective: Patient seen and evaluated today with no new acute complaints or concerns. No acute concerns or events noted overnight.  No further agitation issues noted.  He is more alert and oriented.  Objective: Vitals:   09/13/19 2143 09/13/19 2200 09/14/19 0612 09/14/19 1205  BP: 138/69  (!) 152/75 124/72  Pulse: (!) 59 (!) 57 70 81  Resp: 18  18  Temp: 98.2 F (36.8 C)  97.7 F (36.5 C) (!) 97.5 F (36.4 C)  TempSrc: Oral  Oral Oral  SpO2: 95%  95% 95%  Weight:      Height:        Intake/Output Summary (Last 24 hours) at 09/14/2019 1214 Last data filed at 09/14/2019 0600 Gross per 24 hour  Intake 1340 ml  Output 1200 ml  Net 140 ml   Filed Weights   09/11/19 1032  Weight: 81.2 kg     Examination:  General exam: Appears calm and comfortable  Respiratory system: Clear to auscultation. Respiratory effort normal. Cardiovascular system: S1 & S2 heard, RRR. No JVD, murmurs, rubs, gallops or clicks. No pedal edema. Gastrointestinal system: Abdomen is nondistended, soft and nontender. No organomegaly or masses felt. Normal bowel sounds heard. Central nervous system: Alert and oriented. No focal neurological deficits. Extremities: Symmetric 5 x 5 power. Skin: No rashes, lesions or ulcers Psychiatry: Mood and affect appropriate. Foley: Clear, yellow urine output noted    Data Reviewed: I have personally reviewed following labs and imaging studies  CBC: Recent Labs  Lab 09/11/19 1100 09/11/19 1219 09/13/19 0514 09/14/19 0540  WBC 13.8* 12.7* 7.0 4.9  NEUTROABS 12.2* 10.9*  --   --   HGB 14.1 12.6* 13.8 14.1  HCT 42.0 38.1* 41.2 42.9  MCV 92.3 92.9 92.0 93.1  PLT 55* 50* 64* 66*   Basic Metabolic Panel: Recent Labs  Lab 09/11/19 1100 09/12/19 0428 09/13/19 0514 09/14/19 0540  NA 136 142 140 143  K 3.4* 3.6 3.6 3.3*  CL 100 106 108 109  CO2 24 24 23 23   GLUCOSE 105* 100* 102* 77  BUN 19 20 23  25*  CREATININE 1.78* 1.30* 1.13 1.27*  CALCIUM 8.5* 8.6* 8.3* 8.5*  MG  --  1.9 2.1 2.1  PHOS  --  3.3 3.4 2.0*   GFR: Estimated Creatinine Clearance: 41 mL/min (A) (by C-G formula based on SCr of 1.27 mg/dL (H)). Liver Function Tests: Recent Labs  Lab 09/11/19 1100 09/12/19 0428 09/13/19 0514 09/14/19 0540  AST 109* 138* 109* 70*  ALT 37 46* 47* 42  ALKPHOS 59 52 48 49  BILITOT 2.2* 1.9* 1.3* 1.4*  PROT 5.8* 5.8* 5.7* 5.7*  ALBUMIN 3.2* 3.0* 2.9* 2.9*   No results for input(s): LIPASE, AMYLASE in the last 168 hours. No results for input(s): AMMONIA in the last 168 hours. Coagulation Profile: Recent Labs  Lab 09/11/19 1219  INR 1.3*   Cardiac Enzymes: No results for input(s): CKTOTAL, CKMB, CKMBINDEX, TROPONINI in the last 168 hours. BNP  (last 3 results) No results for input(s): PROBNP in the last 8760 hours. HbA1C: No results for input(s): HGBA1C in the last 72 hours. CBG: Recent Labs  Lab 09/11/19 1048  GLUCAP 116*   Lipid Profile: Recent Labs    09/11/19 1409  TRIG 54   Thyroid Function Tests: Recent Labs    09/11/19 1409  TSH 1.337   Anemia Panel: Recent Labs    09/13/19 0514 09/14/19 0540  FERRITIN 376* 294   Sepsis Labs: Recent Labs  Lab 09/11/19 1219 09/11/19 1409 09/12/19 1448 09/13/19 0514 09/14/19 0540  PROCALCITON  --  0.54 0.36 0.28 0.14  LATICACIDVEN 2.5* 1.8  --   --   --     Recent Results (from the past 240 hour(s))  Respiratory Panel by RT PCR (Flu A&B, Covid) - Nasopharyngeal Swab     Status: Abnormal   Collection Time: 09/11/19 11:01 AM  Specimen: Nasopharyngeal Swab  Result Value Ref Range Status   SARS Coronavirus 2 by RT PCR POSITIVE (A) NEGATIVE Final    Comment: RESULT CALLED TO, READ BACK BY AND VERIFIED WITH: CAGLE J. AT [id number] ON [id number] BY THOMPSON S. (NOTE) SARS-CoV-2 target nucleic acids are DETECTED. SARS-CoV-2 RNA is generally detectable in upper respiratory specimens  during the acute phase of infection. Positive results are indicative of the presence of the identified virus, but do not rule out bacterial infection or co-infection with other pathogens not detected by the test. Clinical correlation with patient history and other diagnostic information is necessary to determine patient infection status. The expected result is Negative. Fact Sheet for Patients:  PinkCheek.be Fact Sheet for Healthcare Providers: GravelBags.it This test is not yet approved or cleared by the [place] FDA and  has been authorized for detection and/or diagnosis of SARS-CoV-2 by FDA under an Emergency Use Authorization (EUA).  This EUA will remain in effect (meaning this test can be u sed) for the duration of  the  COVID-19 declaration under Section 564(b)(1) of the Act, 21 U.S.C. section 360bbb-3(b)(1), unless the authorization is terminated or revoked sooner.    Influenza A by PCR NEGATIVE NEGATIVE Final   Influenza B by PCR NEGATIVE NEGATIVE Final    Comment: (NOTE) The Xpert Xpress SARS-CoV-2/FLU/RSV assay is intended as an aid in  the diagnosis of influenza from Nasopharyngeal swab specimens and  should not be used as a sole basis for treatment. Nasal washings and  aspirates are unacceptable for Xpert Xpress SARS-CoV-2/FLU/RSV  testing. Fact Sheet for Patients: PinkCheek.be Fact Sheet for Healthcare Providers: GravelBags.it This test is not yet approved or cleared by the [place] FDA and  has been authorized for detection and/or diagnosis of SARS-CoV-2 by  FDA under an Emergency Use Authorization (EUA). This EUA will remain  in effect (meaning this test can be used) for the duration of the  Covid-19 declaration under Section 564(b)(1) of the Act, 21  U.S.C. section 360bbb-3(b)(1), unless the authorization is  terminated or revoked. Performed at [facility], [address] [place], [place] [address]   Blood Culture (routine x 2)     Status: None (Preliminary result)   Collection Time: 09/11/19 12:19 PM   Specimen: BLOOD LEFT ARM  Result Value Ref Range Status   Specimen Description BLOOD LEFT ARM  Final   Special Requests   Final    BOTTLES DRAWN AEROBIC AND ANAEROBIC Blood Culture adequate volume   Culture   Final    NO GROWTH 3 DAYS Performed at [facility], [address], [place], [place] [address]    Report Status PENDING  Incomplete  Blood Culture (routine x 2)     Status: None (Preliminary result)   Collection Time: 09/11/19 12:19 PM   Specimen: BLOOD  Result Value Ref Range Status   Specimen Description BLOOD LEFT ANTECUBITAL  Final   Special Requests   Final    BOTTLES DRAWN AEROBIC ONLY Blood Culture adequate  volume   Culture   Final    NO GROWTH 3 DAYS Performed at [facility], [address], [place], [place] [address]    Report Status PENDING  Incomplete         Radiology Studies: No results found.      Scheduled Meds: . vitamin C  500 mg Oral Daily  . aspirin EC  81 mg Oral Daily  . Chlorhexidine Gluconate Cloth  6 each Topical Daily  . clopidogrel  75 mg Oral Daily  . metoprolol succinate  25 mg Oral BID  . pantoprazole  40 mg Oral Daily  . tamsulosin  0.4 mg Oral Daily  . zinc sulfate  220 mg Oral Daily   Continuous Infusions: . azithromycin 500 mg (09/14/19 [id number])  . cefTRIAXone (ROCEPHIN)  IV 2 g (09/14/19 0731)  . remdesivir 100 mg in NS 100 mL 100 mg (09/14/19 0905)     LOS: 3 days    Time spent: 30 minutes    Raneem Mendolia Darleen Crocker, DO [facility]  If 7PM-7AM, please contact night-coverage [email or web address] 09/14/2019, 12:14 PM

## 2019-09-14 NOTE — Progress Notes (Signed)
Physical Therapy Treatment Patient Details Name: John [name] MRN: [id number] DOB: January 01, 1930 Today's Date: 09/14/2019    History of Present Illness John [name] is a 86 y.o. male with a past medical history significant for chronic kidney disease a stage IIIb, coronary artery disease status post STEMI with PCI and CABG, chronic ischemic cardiomyopathy with systolic heart failure, essential hypertension, BPH and thrombocytopenia; who presented to the emergency department secondary to fever, general malaise, decreased appetite, fatigue and shortness of breath on exertion.  Patient reports symptom has been present for the last 3-4 days and worsening.  He denies any chest pain, no nausea, no vomiting, no dysuria, no hematuria, no melena, no abdominal pain, focal weakness or any other complaints.    PT Comments    Patient demonstrates labored movement for sitting up at bedside with most difficulty scooting to EOB, increased BLE strength for sit to stand, transfers and taking steps in room, tolerated ambulating to door and back to bedside with slow labored unsteady cadence and limited due to c/o fatigue.  Patient requires repeated verbal/tactile cueing throughout treatment mostly due to very [place].  Patient tolerated sitting up in chair after therapy - RN notified.  Patient will benefit from continued physical therapy in hospital and recommended venue below to increase strength, balance, endurance for safe ADLs and gait.   Follow Up Recommendations  SNF     Equipment Recommendations  None recommended by PT    Recommendations for Other Services       Precautions / Restrictions Precautions Precautions: Fall Restrictions Weight Bearing Restrictions: No    Mobility  Bed Mobility Overal bed mobility: Needs Assistance Bed Mobility: Supine to Sit     Supine to sit: Mod assist     General bed mobility comments: slow labored movement, difficulty scooting forward due to  weakness  Transfers Overall transfer level: Needs assistance Equipment used: Rolling walker (2 wheeled) Transfers: Sit to/from [facility] Sit to Stand: Min assist;Mod assist Stand pivot transfers: Min assist;Mod assist       General transfer comment: demonstrates increased BLE strength for sit to stands and transfers  Ambulation/Gait Ambulation/Gait assistance: Mod assist Gait Distance (Feet): 22 Feet Assistive device: Rolling walker (2 wheeled) Gait Pattern/deviations: Decreased step length - right;Decreased step length - left;Decreased stride length Gait velocity: decreased   General Gait Details: increased endurance/distance for ambulation with slow labored unsteady cadence requiring much time to make turns, limited secondary to c/o fatigue   Stairs             Wheelchair Mobility    Modified Rankin (Stroke Patients Only)       Balance Overall balance assessment: Needs assistance Sitting-balance support: Feet supported;No upper extremity supported Sitting balance-Leahy Scale: Fair Sitting balance - Comments: fair/good seated at EOB   Standing balance support: During functional activity;Bilateral upper extremity supported Standing balance-Leahy Scale: Fair Standing balance comment: using RW                            Cognition Arousal/Alertness: Awake/alert Behavior During Therapy: WFL for tasks assessed/performed Overall Cognitive Status: Within Functional Limits for tasks assessed                                 General Comments: Able to follow directions appropriately when you speak very closely to his left ear      Exercises General  Exercises - Lower Extremity Ankle Circles/Pumps: Seated;AROM;Strengthening;Both;10 reps Long Arc Quad: Seated;AROM;Strengthening;Both;10 reps Hip Flexion/Marching: Seated;AROM;Strengthening;Both;10 reps    General Comments        Pertinent Vitals/Pain Pain Assessment:  Faces Faces Pain Scale: Hurts little more Pain Location: low back with movement Pain Descriptors / Indicators: Sore;Aching Pain Intervention(s): Limited activity within patient's tolerance;Monitored during session;Repositioned    Home Living                      Prior Function            PT Goals (current goals can now be found in the care plan section) Acute Rehab PT Goals Patient Stated Goal: return home PT Goal Formulation: With patient Time For Goal Achievement: 09/27/19 Potential to Achieve Goals: Good Progress towards PT goals: Progressing toward goals    Frequency    Min 3X/week      PT Plan Current plan remains appropriate    Co-evaluation              AM-PAC PT "6 Clicks" Mobility   Outcome Measure  Help needed turning from your back to your side while in a flat bed without using bedrails?: A Little Help needed moving from lying on your back to sitting on the side of a flat bed without using bedrails?: A Lot Help needed moving to and from a bed to a chair (including a wheelchair)?: A Lot Help needed standing up from a chair using your arms (e.g., wheelchair or bedside chair)?: A Lot Help needed to walk in hospital room?: A Lot Help needed climbing 3-5 steps with a railing? : Total 6 Click Score: 12    End of Session   Activity Tolerance: Patient tolerated treatment well;Patient limited by fatigue Patient left: in chair;with call bell/phone within reach;with chair alarm set Nurse Communication: Mobility status PT Visit Diagnosis: Unsteadiness on feet (R26.81);Other abnormalities of gait and mobility (R26.89);Muscle weakness (generalized) (M62.81)     Time: [id number] PT Time Calculation (min) (ACUTE ONLY): 29 min  Charges:  $Therapeutic Exercise: 8-22 mins $Therapeutic Activity: 8-22 mins                     1:59 PM, 09/14/19 John [name] Grandchild, MPT Physical Therapist with [facility] 336 [phone] office [phone] mobile  phone

## 2019-09-14 NOTE — Care Management Important Message (Signed)
Important Message  Patient Details  Name: John [name] MRN: [id number] Date of Birth: 1929/08/02   Medicare Important Message Given:  Gae Dry copy will be placed at bedsidne for family member)     Tommy Medal 09/14/2019, 2:07 PM

## 2019-09-14 NOTE — Progress Notes (Signed)
CSW in contact with family to make them aware of PT recommendations for SNF. Family states that they do not wish for patient to discharge to SNF and that the discharge plan is for him to return home with home health.   Family requested more information as to what home health intells. CSW explained the basic process of home health and what the family can expect. CSW explains that she will send home health referral out to agencies and report back as to which one accepts referral. Family expresses understanding and is agreeable.   [facility]  Clinical Social Worker  Ph: [phone]

## 2019-09-15 LAB — BASIC METABOLIC PANEL
Anion gap: 8 (ref 5–15)
BUN: 18 mg/dL (ref 8–23)
CO2: 24 mmol/L (ref 22–32)
Calcium: 8.2 mg/dL — ABNORMAL LOW (ref 8.9–10.3)
Chloride: 107 mmol/L (ref 98–111)
Creatinine, Ser: 0.99 mg/dL (ref 0.61–1.24)
GFR calc Af Amer: >60 mL/min (ref >60)
GFR calc non Af Amer: >60 mL/min (ref >60)
Glucose, Bld: 103 mg/dL — ABNORMAL HIGH (ref 70–99)
Potassium: 3.4 mmol/L — ABNORMAL LOW (ref 3.5–5.1)
Sodium: 139 mmol/L (ref 135–145)

## 2019-09-15 LAB — FERRITIN: Ferritin: 293 ng/mL (ref 24–336)

## 2019-09-15 LAB — CBC
HCT: 40.5 % (ref 39.0–52.0)
Hemoglobin: 13.7 g/dL (ref 13.0–17.0)
MCH: 30.6 pg (ref 26.0–34.0)
MCHC: 33.8 g/dL (ref 30.0–36.0)
MCV: 90.6 fL (ref 80.0–100.0)
Platelets: 77 10*3/uL — ABNORMAL LOW (ref 150–400)
RBC: 4.47 MIL/uL (ref 4.22–5.81)
RDW: 14.1 % (ref 11.5–15.5)
WBC: 4.6 10*3/uL (ref 4.0–10.5)
nRBC: 0 % (ref 0.0–0.2)

## 2019-09-15 LAB — COMPREHENSIVE METABOLIC PANEL
ALT: 42 U/L (ref 0–44)
AST: 65 U/L — ABNORMAL HIGH (ref 15–41)
Albumin: 2.7 g/dL — ABNORMAL LOW (ref 3.5–5.0)
Alkaline Phosphatase: 52 U/L (ref 38–126)
Anion gap: 9 (ref 5–15)
BUN: 20 mg/dL (ref 8–23)
CO2: 26 mmol/L (ref 22–32)
Calcium: 8.2 mg/dL — ABNORMAL LOW (ref 8.9–10.3)
Chloride: 106 mmol/L (ref 98–111)
Creatinine, Ser: 1.03 mg/dL (ref 0.61–1.24)
GFR calc Af Amer: >60 mL/min (ref >60)
GFR calc non Af Amer: >60 mL/min (ref >60)
Glucose, Bld: 86 mg/dL (ref 70–99)
Potassium: 2.9 mmol/L — ABNORMAL LOW (ref 3.5–5.1)
Sodium: 141 mmol/L (ref 135–145)
Total Bilirubin: 1.3 mg/dL — ABNORMAL HIGH (ref 0.3–1.2)
Total Protein: 5.5 g/dL — ABNORMAL LOW (ref 6.5–8.1)

## 2019-09-15 LAB — MAGNESIUM: Magnesium: 1.9 mg/dL (ref 1.7–2.4)

## 2019-09-15 LAB — D-DIMER, QUANTITATIVE: D-Dimer, Quant: 8.1 ug/mL-FEU — ABNORMAL HIGH (ref 0.00–0.50)

## 2019-09-15 LAB — C-REACTIVE PROTEIN: CRP: 2.5 mg/dL — ABNORMAL HIGH (ref <1.0)

## 2019-09-15 LAB — PHOSPHORUS: Phosphorus: 2.4 mg/dL — ABNORMAL LOW (ref 2.5–4.6)

## 2019-09-15 MED ORDER — ASCORBIC ACID 500 MG PO TABS: 500.0000 mg | ORAL_TABLET | Freq: Every day | ORAL | 0 refills | Status: AC

## 2019-09-15 MED ORDER — POTASSIUM CHLORIDE CRYS ER 20 MEQ PO TBCR
40.0000 meq | EXTENDED_RELEASE_TABLET | Freq: Once | ORAL | Status: AC
  Administered 2019-09-15: 40 meq via ORAL
  Filled 2019-09-15: qty 2

## 2019-09-15 MED ORDER — CEFDINIR 300 MG PO CAPS: 300.0000 mg | ORAL_CAPSULE | Freq: Two times a day (BID) | ORAL | 0 refills | Status: DC

## 2019-09-15 MED ORDER — GUAIFENESIN-DM 100-10 MG/5ML PO SYRP: 10.0000 mL | ORAL_SOLUTION | ORAL | 0 refills | Status: DC | PRN

## 2019-09-15 MED ORDER — ZINC SULFATE 220 (50 ZN) MG PO CAPS: 220.0000 mg | ORAL_CAPSULE | Freq: Every day | ORAL | 0 refills | Status: AC

## 2019-09-15 MED ORDER — IPRATROPIUM-ALBUTEROL 20-100 MCG/ACT IN AERS: 1.0000 | INHALATION_SPRAY | Freq: Four times a day (QID) | RESPIRATORY_TRACT | 1 refills | Status: DC | PRN

## 2019-09-15 MED ORDER — LACTATED RINGERS IV SOLN: INTRAVENOUS | Status: AC

## 2019-09-15 MED ORDER — POTASSIUM CHLORIDE 10 MEQ/100ML IV SOLN
10.0000 meq | INTRAVENOUS | Status: AC
  Administered 2019-09-15: 10 meq via INTRAVENOUS
  Filled 2019-09-15: qty 100

## 2019-09-15 NOTE — Discharge Summary (Signed)
Physician Discharge Summary  John [name] [id number] DOB: June 07, 1930 DOA: 09/11/2019  PCP: John [name]  Admit date: 09/11/2019  Discharge date: 09/15/2019  Admitted From:Home  Disposition:  Home  Recommendations for Outpatient Follow-up:  1. Follow up with PCP in 1-2 weeks 2. Please obtain BMP in one week to reassess potassium levels 3. Follow-up with urology outpatient for voiding trial 4. Continue on Omnicef as prescribed for 3 more days 5. Avoid further steroid use given severe side effects and steroid-induced psychosis.  Inflammatory markers have down trended and patient does not appear to require any further steroids at this time.  No need for home oxygen.  Home Health:Yes with PT  Equipment/Devices:Foley catheter  Discharge Condition: Stable  CODE STATUS: Full  Diet recommendation: Heart Healthy  Brief/Interim Summary: [name] HPI: John [name] a 86 y.o.malewith a past medical history significant for chronic kidney disease a stage IIIb, coronary artery disease status post STEMI with PCI and CABG,chronic ischemic cardiomyopathy with systolic heart failure, essential hypertension, BPHandthrombocytopenia;who presented to the emergency department secondary to fever, general malaise, decreased appetite, fatigue and shortness of breath on exertion. Patient reports symptom has been present for the last 3-4 days and worsening. He denies any chest pain, no nausea, no vomiting, no dysuria, no hematuria, no melena, no abdominal pain, focal weakness or any other complaints.  In the ED work-up demonstrated chest x-ray with multifocal infiltrates, soft blood pressure with a rapidly response to IV fluid resuscitation, low-grade temperature, elevated respiratory rate, lactic acid of 2.5, findings of acute on chronic renal failure with a creatinine of 1.78 and elevated WBCs at 13.8. Covid test came back to be positive and inflammatory markers are also corroborates acute  infection. Blood cultures taken, IV antibiotics given and TR[facility] contacted today patient for further evaluation and management of sepsis due to COVID-19 pneumonia with concern for superimposed bacterial component.  3/10: Patient was admitted with sepsis secondary to COVID-19 pneumonia with concerns for superimposed bacterial component. He has remained on azithromycin and Rocephin as well as remdesivir and steroids and continues to have some elevation in inflammatory markers, but is not hypoxemic at rest. Will need to continue to monitor progression with labs.  3/11:Patient noted to have agitation overnight and now has mitts in place. He still appears quite confused. He received Ativan with some assistance in his agitation. Will order Haldol and sitter at bedside for further observation. Inflammatory markers are downtrending.  3/12: Patient appears to be improving with less agitation noted.  Plan to finish off remdesivir course while hospitalized.  Family members are refusing SNF and would like for him to come home with home health PT on discharge.  3/13: Patient has finished his course of remdesivir today and is stable for discharge.  He was noted to have some hypokalemia for which he has been supplemented IV and oral potassium with improvement on lab work noted.  Unfortunately, voiding trial was unsuccessful and patient will need to be discharged with Foley catheter at this time and will need close follow-up with urology for voiding trials in the outpatient setting.  He will remain on Omnicef for 3 more days to complete a 7-day course of antibiotic treatment for any potential superimposed bacterial infection.  Inflammatory markers have down trended and patient will remain off steroids given the fact that he had some psychosis related side effects that preclude him from using this medication any further.  He has remained without oxygen supplementation in the last several days and  otherwise has no  respiratory distress.  Discharge Diagnoses:  Principal Problem:   Pneumonia due to COVID-19 virus Active Problems:   CAD in native artery   Essential hypertension, benign   Sepsis ([place])  Principal discharge diagnosis: Sepsis secondary to COVID-19 pneumonia.  Discharge Instructions  Discharge Instructions    Diet - low sodium heart healthy   Complete by: As directed    Increase activity slowly   Complete by: As directed      Allergies as of 09/15/2019      Reactions   Statins Other (See Comments)   Weak,nervous   Xanax [alprazolam] Other (See Comments)   Hallucination   Tape Rash      Medication List    TAKE these medications   acetaminophen 325 MG tablet Commonly known as: TYLENOL Take 2 tablets (650 mg total) by mouth every 4 (four) hours as needed for headache or mild pain.   ascorbic acid 500 MG tablet Commonly known as: VITAMIN C Take 1 tablet (500 mg total) by mouth daily.   aspirin EC 81 MG tablet Take 81 mg by mouth daily.   cefdinir 300 MG capsule Commonly known as: OMNICEF Take 1 capsule (300 mg total) by mouth 2 (two) times daily.   clopidogrel 75 MG tablet Commonly known as: PLAVIX Take 1 tablet by mouth once daily with breakfast   clotrimazole-betamethasone cream Commonly known as: LOTRISONE Apply 1 application topically 2 (two) times daily.   Entresto 24-26 MG Generic drug: sacubitril-valsartan Take 1 tablet by mouth 2 (two) times daily.   fludrocortisone 0.1 MG tablet Commonly known as: FLORINEF Take 100 mcg by mouth daily.   furosemide 20 MG tablet Commonly known as: LASIX Take 1 tablet (20 mg total) by mouth daily.   guaiFENesin-dextromethorphan 100-10 MG/5ML syrup Commonly known as: ROBITUSSIN DM Take 10 mLs by mouth every 4 (four) hours as needed for cough.   ibuprofen 400 MG tablet Commonly known as: ADVIL Take 400 mg by mouth every 6 (six) hours as needed.   Ipratropium-Albuterol 20-100 MCG/ACT Aers respimat Commonly  known as: COMBIVENT Inhale 1 puff into the lungs every 6 (six) hours as needed for wheezing.   metoprolol succinate 25 MG 24 hr tablet Commonly known as: TOPROL-XL Take 1 tablet by mouth twice daily   neomycin-polymyxin b-dexamethasone 3.5-10000-0.1 Oint Commonly known as: MAXITROL Place 1 application into the left eye daily.   nitroGLYCERIN 0.4 MG SL tablet Commonly known as: NITROSTAT Place 1 tablet (0.4 mg total) under the tongue every 5 (five) minutes x 3 doses as needed for chest pain.   pantoprazole 40 MG tablet Commonly known as: PROTONIX Take 40 mg by mouth daily.   potassium chloride SA 20 MEQ tablet Commonly known as: Klor-Con M20 Take 2 tablets (40 mEq total) by mouth daily. What changed: how much to take   tamsulosin 0.4 MG Caps capsule Commonly known as: FLOMAX Take 0.4 mg by mouth daily.   zinc sulfate 220 (50 Zn) MG capsule Take 1 capsule (220 mg total) by mouth daily.      Follow-up Information    [name], MD Follow up in 1 week(s).   Specialty: Cardiology Contact information: [place] [address] [phone]        [facility] Follow up in 1 week(s).   Contact information: [address], [address] SSN-[id number]         Allergies  Allergen Reactions  . Statins  Other (See Comments)    Weak,nervous  . Xanax [Alprazolam] Other (See Comments)    Hallucination  . Tape Rash    Consultations:  None   Procedures/Studies: DG Chest Port 1 View  Result Date: 09/11/2019 CLINICAL DATA:  Fever, weakness for several days, hypotensive EXAM: PORTABLE CHEST 1 VIEW COMPARISON:  11/11/2015 FINDINGS: Single frontal view of the chest demonstrates stable enlargement of the cardiac silhouette. Stable postsurgical changes from CABG. Mild ectasia of the thoracic aorta with atherosclerosis of the aortic arch again noted. There is multifocal bilateral airspace disease, greatest in  the left lower lobe. No large effusion or pneumothorax. No acute bony abnormality. IMPRESSION: 1. Multifocal bilateral airspace disease greatest in the left lower lobe. Favor multifocal infection over edema. Electronically Signed   By: Randa Ngo M.D.   On: 09/11/2019 12:11     Discharge Exam: Vitals:   09/14/19 2036 09/15/19 0514  BP: (!) 157/77 133/84  Pulse: 65 71  Resp: 16 16  Temp: 98.2 F (36.8 C) 98.2 F (36.8 C)  SpO2: 97% 96%   Vitals:   09/14/19 1205 09/14/19 1550 09/14/19 2036 09/15/19 0514  BP: 124/72 117/74 (!) 157/77 133/84  Pulse: 81 75 65 71  Resp:   16 16  Temp: (!) 97.5 F (36.4 C)  98.2 F (36.8 C) 98.2 F (36.8 C)  TempSrc: Oral  Oral   SpO2: 95% 97% 97% 96%  Weight:      Height:        General: Pt is alert, awake, not in acute distress Cardiovascular: RRR, S1/S2 +, no rubs, no gallops Respiratory: CTA bilaterally, no wheezing, no rhonchi Abdominal: Soft, NT, ND, bowel sounds + Extremities: no edema, no cyanosis Foley with clear yellow urine output noted.    The results of significant diagnostics from this hospitalization (including imaging, microbiology, ancillary and laboratory) are listed below for reference.     Microbiology: Recent Results (from the past 240 hour(s))  Respiratory Panel by RT PCR (Flu A&B, Covid) - Nasopharyngeal Swab     Status: Abnormal   Collection Time: 09/11/19 11:01 AM   Specimen: Nasopharyngeal Swab  Result Value Ref Range Status   SARS Coronavirus 2 by RT PCR POSITIVE (A) NEGATIVE Final    Comment: RESULT CALLED TO, READ BACK BY AND VERIFIED WITH: CAGLE J. AT 1316 ON [id number] BY THOMPSON S. (NOTE) SARS-CoV-2 target nucleic acids are DETECTED. SARS-CoV-2 RNA is generally detectable in upper respiratory specimens  during the acute phase of infection. Positive results are indicative of the presence of the identified virus, but do not rule out bacterial infection or co-infection with other pathogens not detected  by the test. Clinical correlation with patient history and other diagnostic information is necessary to determine patient infection status. The expected result is Negative. Fact Sheet for Patients:  PinkCheek.be Fact Sheet for Healthcare Providers: GravelBags.it This test is not yet approved or cleared by the [place] FDA and  has been authorized for detection and/or diagnosis of SARS-CoV-2 by FDA under an Emergency Use Authorization (EUA).  This EUA will remain in effect (meaning this test can be u sed) for the duration of  the COVID-19 declaration under Section 564(b)(1) of the Act, 21 U.S.C. section 360bbb-3(b)(1), unless the authorization is terminated or revoked sooner.    Influenza A by PCR NEGATIVE NEGATIVE Final   Influenza B by PCR NEGATIVE NEGATIVE Final    Comment: (NOTE) The Xpert Xpress SARS-CoV-2/FLU/RSV assay is intended as an aid in  the diagnosis  of influenza from Nasopharyngeal swab specimens and  should not be used as a sole basis for treatment. Nasal washings and  aspirates are unacceptable for Xpert Xpress SARS-CoV-2/FLU/RSV  testing. Fact Sheet for Patients: PinkCheek.be Fact Sheet for Healthcare Providers: GravelBags.it This test is not yet approved or cleared by the [place] FDA and  has been authorized for detection and/or diagnosis of SARS-CoV-2 by  FDA under an Emergency Use Authorization (EUA). This EUA will remain  in effect (meaning this test can be used) for the duration of the  Covid-19 declaration under Section 564(b)(1) of the Act, 21  U.S.C. section 360bbb-3(b)(1), unless the authorization is  terminated or revoked. Performed at [facility], [address], [place], [place] [address]   Blood Culture (routine x 2)     Status: None (Preliminary result)   Collection Time: 09/11/19 12:19 PM   Specimen: BLOOD LEFT ARM  Result  Value Ref Range Status   Specimen Description BLOOD LEFT ARM  Final   Special Requests   Final    BOTTLES DRAWN AEROBIC AND ANAEROBIC Blood Culture adequate volume   Culture   Final    NO GROWTH 4 DAYS Performed at [facility], [address], [place], [place] [address]    Report Status PENDING  Incomplete  Blood Culture (routine x 2)     Status: None (Preliminary result)   Collection Time: 09/11/19 12:19 PM   Specimen: BLOOD  Result Value Ref Range Status   Specimen Description BLOOD LEFT ANTECUBITAL  Final   Special Requests   Final    BOTTLES DRAWN AEROBIC ONLY Blood Culture adequate volume   Culture   Final    NO GROWTH 4 DAYS Performed at [facility], [address], [place], [place] [address]    Report Status PENDING  Incomplete     Labs: BNP (last 3 results) Recent Labs    09/11/19 1309  BNP [id number]*   Basic Metabolic Panel: Recent Labs  Lab 09/11/19 1100 09/12/19 0428 09/13/19 0514 09/14/19 0540 09/15/19 0738  NA 136 142 140 143 141  K 3.4* 3.6 3.6 3.3* 2.9*  CL 100 106 108 109 106  CO2 24 24 23 23 26   GLUCOSE 105* 100* 102* 77 86  BUN 19 20 23  25* 20  CREATININE 1.78* 1.30* 1.13 1.27* 1.03  CALCIUM 8.5* 8.6* 8.3* 8.5* 8.2*  MG  --  1.9 2.1 2.1 1.9  PHOS  --  3.3 3.4 2.0* 2.4*   Liver Function Tests: Recent Labs  Lab 09/11/19 1100 09/12/19 0428 09/13/19 0514 09/14/19 0540 09/15/19 0738  AST 109* 138* 109* 70* 65*  ALT 37 46* 47* 42 42  ALKPHOS 59 52 48 49 52  BILITOT 2.2* 1.9* 1.3* 1.4* 1.3*  PROT 5.8* 5.8* 5.7* 5.7* 5.5*  ALBUMIN 3.2* 3.0* 2.9* 2.9* 2.7*   No results for input(s): LIPASE, AMYLASE in the last 168 hours. No results for input(s): AMMONIA in the last 168 hours. CBC: Recent Labs  Lab 09/11/19 1100 09/11/19 1219 09/13/19 0514 09/14/19 0540 09/15/19 0738  WBC 13.8* 12.7* 7.0 4.9 4.6  NEUTROABS 12.2* 10.9*  --   --   --   HGB 14.1 12.6* 13.8 14.1 13.7  HCT 42.0 38.1* 41.2 42.9 40.5  MCV 92.3 92.9 92.0 93.1 90.6  PLT 55*  50* 64* 66* 77*   Cardiac Enzymes: No results for input(s): CKTOTAL, CKMB, CKMBINDEX, TROPONINI in the last 168 hours. BNP: Invalid input(s): POCBNP CBG: Recent Labs  Lab 09/11/19 1048  GLUCAP 116*   D-Dimer Recent Labs    09/14/19 0540 09/15/19 0738  DDIMER 15.43* 8.10*   Hgb A1c No results for input(s): HGBA1C in the last 72 hours. Lipid Profile No results for input(s): CHOL, HDL, LDLCALC, TRIG, CHOLHDL, LDLDIRECT in the last 72 hours. Thyroid function studies No results for input(s): TSH, T4TOTAL, T3FREE, THYROIDAB in the last 72 hours.  Invalid input(s): FREET3 Anemia work up Recent Labs    09/14/19 0540 09/15/19 0738  FERRITIN 294 293   Urinalysis    Component Value Date/Time   COLORURINE YELLOW 09/11/2019 0913   APPEARANCEUR CLEAR 09/11/2019 0913   LABSPEC 1.013 09/11/2019 0913   PHURINE 6.0 09/11/2019 0913   GLUCOSEU NEGATIVE 09/11/2019 0913   HGBUR SMALL (A) 09/11/2019 0913   BILIRUBINUR NEGATIVE 09/11/2019 0913   KETONESUR NEGATIVE 09/11/2019 0913   PROTEINUR NEGATIVE 09/11/2019 0913   UROBILINOGEN 0.2 11/28/2009 1120   NITRITE NEGATIVE 09/11/2019 0913   LEUKOCYTESUR NEGATIVE 09/11/2019 0913   Sepsis Labs Invalid input(s): PROCALCITONIN,  WBC,  LACTICIDVEN Microbiology Recent Results (from the past 240 hour(s))  Respiratory Panel by RT PCR (Flu A&B, Covid) - Nasopharyngeal Swab     Status: Abnormal   Collection Time: 09/11/19 11:01 AM   Specimen: Nasopharyngeal Swab  Result Value Ref Range Status   SARS Coronavirus 2 by RT PCR POSITIVE (A) NEGATIVE Final    Comment: RESULT CALLED TO, READ BACK BY AND VERIFIED WITH: CAGLE J. AT 1316 ON [id number] BY THOMPSON S. (NOTE) SARS-CoV-2 target nucleic acids are DETECTED. SARS-CoV-2 RNA is generally detectable in upper respiratory specimens  during the acute phase of infection. Positive results are indicative of the presence of the identified virus, but do not rule out bacterial infection or co-infection  with other pathogens not detected by the test. Clinical correlation with patient history and other diagnostic information is necessary to determine patient infection status. The expected result is Negative. Fact Sheet for Patients:  PinkCheek.be Fact Sheet for Healthcare Providers: GravelBags.it This test is not yet approved or cleared by the [place] FDA and  has been authorized for detection and/or diagnosis of SARS-CoV-2 by FDA under an Emergency Use Authorization (EUA).  This EUA will remain in effect (meaning this test can be u sed) for the duration of  the COVID-19 declaration under Section 564(b)(1) of the Act, 21 U.S.C. section 360bbb-3(b)(1), unless the authorization is terminated or revoked sooner.    Influenza A by PCR NEGATIVE NEGATIVE Final   Influenza B by PCR NEGATIVE NEGATIVE Final    Comment: (NOTE) The Xpert Xpress SARS-CoV-2/FLU/RSV assay is intended as an aid in  the diagnosis of influenza from Nasopharyngeal swab specimens and  should not be used as a sole basis for treatment. Nasal washings and  aspirates are unacceptable for Xpert Xpress SARS-CoV-2/FLU/RSV  testing. Fact Sheet for Patients: PinkCheek.be Fact Sheet for Healthcare Providers: GravelBags.it This test is not yet approved or cleared by the [place] FDA and  has been authorized for detection and/or diagnosis of SARS-CoV-2 by  FDA under an Emergency Use Authorization (EUA). This EUA will remain  in effect (meaning this test can be used) for the duration of the  Covid-19 declaration under Section 564(b)(1) of the Act, 21  U.S.C. section 360bbb-3(b)(1), unless the authorization is  terminated or revoked. Performed at [facility], [address], [place], [place] [address]   Blood Culture (routine x 2)     Status: None (Preliminary result)   Collection Time: 09/11/19 12:19 PM  Specimen: BLOOD LEFT ARM  Result Value Ref Range Status   Specimen Description BLOOD LEFT ARM  Final   Special Requests   Final    BOTTLES DRAWN AEROBIC AND ANAEROBIC Blood Culture adequate volume   Culture   Final    NO GROWTH 4 DAYS Performed at [facility], [address], [place], [place] [address]    Report Status PENDING  Incomplete  Blood Culture (routine x 2)     Status: None (Preliminary result)   Collection Time: 09/11/19 12:19 PM   Specimen: BLOOD  Result Value Ref Range Status   Specimen Description BLOOD LEFT ANTECUBITAL  Final   Special Requests   Final    BOTTLES DRAWN AEROBIC ONLY Blood Culture adequate volume   Culture   Final    NO GROWTH 4 DAYS Performed at [facility], [address] [place], [place] [address]    Report Status PENDING  Incomplete     Time coordinating discharge: 35 minutes  SIGNED:   Rodena Goldmann, DO [facility] 09/15/2019, 8:55 AM  If 7PM-7AM, please contact night-coverage [email or web address]

## 2019-09-15 NOTE — Progress Notes (Signed)
Patient initially voided approximately 50 cc. Pt. Started to be agitated and in pain holding his abd. . Bladder scan performed and noted 350 cc in his bladder.   Foley re-inserted per order.  Meticulous pericare done before insertion. Agricultural consultant notified.

## 2019-09-15 NOTE — Progress Notes (Signed)
Patient has potassium chloride 10 mEq in 100 mL IVPB ordered: Intravenous every 1 hr X 4. Patient could only tolerate one IVPB and complained of unbearable pain despite lowering rate. MD has been informed and all infusions of potassium chloride have been stopped. Patient will receive a po dose of potassium.

## 2019-09-15 NOTE — TOC Transition Note (Signed)
Transition of Care [facility]) - CM/SW Discharge Note   Patient Details  Name: John [name] MRN: [id number] Date of Birth: 06-15-1930  [facility] CM/SW Contact:  Rody Keadle, Chauncey Reading, RN Phone Number: 09/15/2019, 11:19 AM   Clinical Narrative:   Referral accepted by [place] care for home health PT. Patient reports his PCP is Dr. Benny Lennert. Left VM asking for return call from grand daughter to confirm.     Final next level of care: [place] Barriers to Discharge: Barriers Resolved   HH Arranged: PT [place] Agency: [place] ([place]) Date [place]: 09/15/19 Time [place]: 1000 Representative spoke with at [place]: [place] ([place]) Interventions     Readmission Risk Interventions No flowsheet data found.

## 2019-09-16 LAB — FERRITIN: Ferritin: 296 ng/mL (ref 24–336)

## 2019-09-16 LAB — COMPREHENSIVE METABOLIC PANEL
ALT: 51 U/L — ABNORMAL HIGH (ref 0–44)
AST: 69 U/L — ABNORMAL HIGH (ref 15–41)
Albumin: 2.9 g/dL — ABNORMAL LOW (ref 3.5–5.0)
Alkaline Phosphatase: 73 U/L (ref 38–126)
Anion gap: 8 (ref 5–15)
BUN: 13 mg/dL (ref 8–23)
CO2: 24 mmol/L (ref 22–32)
Calcium: 8.4 mg/dL — ABNORMAL LOW (ref 8.9–10.3)
Chloride: 107 mmol/L (ref 98–111)
Creatinine, Ser: 1.04 mg/dL (ref 0.61–1.24)
GFR calc Af Amer: >60 mL/min (ref >60)
GFR calc non Af Amer: >60 mL/min (ref >60)
Glucose, Bld: 116 mg/dL — ABNORMAL HIGH (ref 70–99)
Potassium: 3.7 mmol/L (ref 3.5–5.1)
Sodium: 139 mmol/L (ref 135–145)
Total Bilirubin: 1.5 mg/dL — ABNORMAL HIGH (ref 0.3–1.2)
Total Protein: 5.9 g/dL — ABNORMAL LOW (ref 6.5–8.1)

## 2019-09-16 LAB — CULTURE, BLOOD (ROUTINE X 2)
Culture: NO GROWTH
Culture: NO GROWTH
Special Requests: ADEQUATE
Special Requests: ADEQUATE

## 2019-09-16 LAB — C-REACTIVE PROTEIN: CRP: 2.7 mg/dL — ABNORMAL HIGH (ref <1.0)

## 2019-09-16 LAB — MAGNESIUM: Magnesium: 1.9 mg/dL (ref 1.7–2.4)

## 2019-09-16 LAB — D-DIMER, QUANTITATIVE: D-Dimer, Quant: 9.16 ug/mL-FEU — ABNORMAL HIGH (ref 0.00–0.50)

## 2019-09-16 LAB — PHOSPHORUS: Phosphorus: 2.4 mg/dL — ABNORMAL LOW (ref 2.5–4.6)

## 2019-09-16 NOTE — Progress Notes (Signed)
Nsg Discharge Note  Admit Date:  09/11/2019 Discharge date: 09/16/2019   John [name] to be D/C'd Home per MD order.  AVS completed.  Patient's daughter John [name] [name] to verbalize understanding.  Discharge Medication: Allergies as of 09/16/2019      Reactions   Statins Other (See Comments)   Weak,nervous   Xanax [alprazolam] Other (See Comments)   Hallucination   Tape Rash      Medication List    TAKE these medications   acetaminophen 325 MG tablet Commonly known as: TYLENOL Take 2 tablets (650 mg total) by mouth every 4 (four) hours as needed for headache or mild pain.   ascorbic acid 500 MG tablet Commonly known as: VITAMIN C Take 1 tablet (500 mg total) by mouth daily.   aspirin EC 81 MG tablet Take 81 mg by mouth daily.   cefdinir 300 MG capsule Commonly known as: OMNICEF Take 1 capsule (300 mg total) by mouth 2 (two) times daily.   clopidogrel 75 MG tablet Commonly known as: PLAVIX Take 1 tablet by mouth once daily with breakfast   clotrimazole-betamethasone cream Commonly known as: LOTRISONE Apply 1 application topically 2 (two) times daily.   Entresto 24-26 MG Generic drug: sacubitril-valsartan Take 1 tablet by mouth 2 (two) times daily.   fludrocortisone 0.1 MG tablet Commonly known as: FLORINEF Take 100 mcg by mouth daily.   furosemide 20 MG tablet Commonly known as: LASIX Take 1 tablet (20 mg total) by mouth daily.   guaiFENesin-dextromethorphan 100-10 MG/5ML syrup Commonly known as: ROBITUSSIN DM Take 10 mLs by mouth every 4 (four) hours as needed for cough.   ibuprofen 400 MG tablet Commonly known as: ADVIL Take 400 mg by mouth every 6 (six) hours as needed.   Ipratropium-Albuterol 20-100 MCG/ACT Aers respimat Commonly known as: COMBIVENT Inhale 1 puff into the lungs every 6 (six) hours as needed for wheezing.   metoprolol succinate 25 MG 24 hr tablet Commonly known as: TOPROL-XL Take 1 tablet by mouth twice daily    neomycin-polymyxin b-dexamethasone 3.5-10000-0.1 Oint Commonly known as: MAXITROL Place 1 application into the left eye daily.   nitroGLYCERIN 0.4 MG SL tablet Commonly known as: NITROSTAT Place 1 tablet (0.4 mg total) under the tongue every 5 (five) minutes x 3 doses as needed for chest pain.   pantoprazole 40 MG tablet Commonly known as: PROTONIX Take 40 mg by mouth daily.   potassium chloride SA 20 MEQ tablet Commonly known as: Klor-Con M20 Take 2 tablets (40 mEq total) by mouth daily. What changed: how much to take   tamsulosin 0.4 MG Caps capsule Commonly known as: FLOMAX Take 0.4 mg by mouth daily.   zinc sulfate 220 (50 Zn) MG capsule Take 1 capsule (220 mg total) by mouth daily.       Discharge Assessment: Vitals:   09/16/19 0555 09/16/19 1008  BP: (!) 154/80 119/66  Pulse: 69 72  Resp: 16   Temp: 97.7 F (36.5 C)   SpO2: 94%    Skin clean, dry and intact without evidence of skin break down, no evidence of skin tears noted. IV catheter discontinued intact. Site without signs and symptoms of complications - no redness or edema noted at insertion site, patient denies c/o pain - only slight tenderness at site.  Dressing with slight pressure applied.  D/c Instructions-Education: Discharge instructions given to patient's daughter John [name] with verbalized understanding. D/c education completed with patient's daughter John [name] including follow up instructions, medication list, d/c activities  limitations if indicated, with other d/c instructions as indicated by MD - patient [name] to verbalize understanding, all questions fully answered. Patient's daughter instructed to return to ED, call 911, or call MD for any changes in condition.  Patient escorted via John [name], and D/C home via private auto.  Selin Eisler C, RN 09/16/2019 11:08 AM

## 2019-09-16 NOTE — Plan of Care (Signed)
  Problem: Pain Managment: Goal: General experience of comfort will improve Outcome: Progressing   Problem: Safety: Goal: Ability to remain free from injury will improve Outcome: Progressing   Problem: Skin Integrity: Goal: Risk for impaired skin integrity will decrease Outcome: Progressing   

## 2019-09-16 NOTE — Progress Notes (Signed)
Patient briefly seen and examined this morning.  No acute complaints or concerns noted overnight or this morning.  Please refer to discharge summary dictated 09/15/2019.  He will continue on antibiotics as prescribed as well as Foley catheter with follow-up to urology in the outpatient setting.  He has completed Covid treatment during this inpatient stay.  Total care time: 15 minutes.

## 2019-09-17 ENCOUNTER — Telehealth: Payer: Self-pay | Admitting: Cardiology

## 2019-09-17 DIAGNOSIS — I255 Ischemic cardiomyopathy: Secondary | ICD-10-CM | POA: Diagnosis not present

## 2019-09-17 DIAGNOSIS — G4733 Obstructive sleep apnea (adult) (pediatric): Secondary | ICD-10-CM | POA: Diagnosis not present

## 2019-09-17 DIAGNOSIS — I2510 Atherosclerotic heart disease of native coronary artery without angina pectoris: Secondary | ICD-10-CM | POA: Diagnosis not present

## 2019-09-17 DIAGNOSIS — I5022 Chronic systolic (congestive) heart failure: Secondary | ICD-10-CM | POA: Diagnosis not present

## 2019-09-17 DIAGNOSIS — K219 Gastro-esophageal reflux disease without esophagitis: Secondary | ICD-10-CM | POA: Diagnosis not present

## 2019-09-17 DIAGNOSIS — U071 COVID-19: Secondary | ICD-10-CM | POA: Diagnosis not present

## 2019-09-17 DIAGNOSIS — J1282 Pneumonia due to coronavirus disease 2019: Secondary | ICD-10-CM | POA: Diagnosis not present

## 2019-09-17 DIAGNOSIS — I252 Old myocardial infarction: Secondary | ICD-10-CM | POA: Diagnosis not present

## 2019-09-17 DIAGNOSIS — D696 Thrombocytopenia, unspecified: Secondary | ICD-10-CM | POA: Diagnosis not present

## 2019-09-17 DIAGNOSIS — N1832 Chronic kidney disease, stage 3b: Secondary | ICD-10-CM | POA: Diagnosis not present

## 2019-09-17 DIAGNOSIS — E782 Mixed hyperlipidemia: Secondary | ICD-10-CM | POA: Diagnosis not present

## 2019-09-17 DIAGNOSIS — I130 Hypertensive heart and chronic kidney disease with heart failure and stage 1 through stage 4 chronic kidney disease, or unspecified chronic kidney disease: Secondary | ICD-10-CM | POA: Diagnosis not present

## 2019-09-17 NOTE — Telephone Encounter (Signed)
Received telephone call from Jolaine Click with [facility] . [phone]    wanting to know if Dr. Domenic Polite will write orders for patient to have physical therapy due to COVID pneumonia. At present time patient has no PCP with Dr. Luan Pulling retiring.

## 2019-09-18 NOTE — Telephone Encounter (Signed)
Will forward to Dr. McDowell to advise.  

## 2019-09-18 NOTE — Telephone Encounter (Signed)
Returned call to [employer]. No answer, left detailed message on private voicemail per Dr. Myles Gip recommendations to agree to help with physical therapy for pt.

## 2019-09-18 NOTE — Telephone Encounter (Signed)
Yes, we can help with that.  He still needs to make sure that he establishes with a PCP.

## 2019-09-19 ENCOUNTER — Ambulatory Visit: Payer: Medicare Other | Admitting: Urology

## 2019-09-19 ENCOUNTER — Encounter: Payer: Self-pay | Admitting: Urology

## 2019-09-19 ENCOUNTER — Other Ambulatory Visit: Payer: Self-pay

## 2019-09-19 ENCOUNTER — Ambulatory Visit (INDEPENDENT_AMBULATORY_CARE_PROVIDER_SITE_OTHER): Payer: Medicare Other | Admitting: Urology

## 2019-09-19 VITALS — BP 129/71 | HR 75 | Temp 96.8°F | Ht 66.0 in | Wt 179.0 lb

## 2019-09-19 DIAGNOSIS — D696 Thrombocytopenia, unspecified: Secondary | ICD-10-CM | POA: Diagnosis not present

## 2019-09-19 DIAGNOSIS — R339 Retention of urine, unspecified: Secondary | ICD-10-CM

## 2019-09-19 DIAGNOSIS — J1282 Pneumonia due to coronavirus disease 2019: Secondary | ICD-10-CM | POA: Diagnosis not present

## 2019-09-19 DIAGNOSIS — K219 Gastro-esophageal reflux disease without esophagitis: Secondary | ICD-10-CM | POA: Diagnosis not present

## 2019-09-19 DIAGNOSIS — U071 COVID-19: Secondary | ICD-10-CM | POA: Diagnosis not present

## 2019-09-19 DIAGNOSIS — N1832 Chronic kidney disease, stage 3b: Secondary | ICD-10-CM | POA: Diagnosis not present

## 2019-09-19 DIAGNOSIS — G4733 Obstructive sleep apnea (adult) (pediatric): Secondary | ICD-10-CM | POA: Diagnosis not present

## 2019-09-19 DIAGNOSIS — I2510 Atherosclerotic heart disease of native coronary artery without angina pectoris: Secondary | ICD-10-CM | POA: Diagnosis not present

## 2019-09-19 DIAGNOSIS — I252 Old myocardial infarction: Secondary | ICD-10-CM | POA: Diagnosis not present

## 2019-09-19 DIAGNOSIS — I130 Hypertensive heart and chronic kidney disease with heart failure and stage 1 through stage 4 chronic kidney disease, or unspecified chronic kidney disease: Secondary | ICD-10-CM | POA: Diagnosis not present

## 2019-09-19 DIAGNOSIS — I255 Ischemic cardiomyopathy: Secondary | ICD-10-CM | POA: Diagnosis not present

## 2019-09-19 DIAGNOSIS — E782 Mixed hyperlipidemia: Secondary | ICD-10-CM | POA: Diagnosis not present

## 2019-09-19 DIAGNOSIS — I5022 Chronic systolic (congestive) heart failure: Secondary | ICD-10-CM | POA: Diagnosis not present

## 2019-09-19 NOTE — Patient Instructions (Signed)

## 2019-09-19 NOTE — Progress Notes (Signed)
Urological Symptom Review  Patient is experiencing the following symptoms: Get up at night to urinate  Kidney stones   Review of Systems  Gastrointestinal (upper)  : Negative for upper GI symptoms  Gastrointestinal (lower) : Negative for lower GI symptoms  Constitutional : Negative for symptoms  Skin: Negative for skin symptoms  Eyes: Negative for eye symptoms  Ear/Nose/Throat : Negative for Ear/Nose/Throat symptoms  Hematologic/Lymphatic: Easy bruising  Cardiovascular : Leg swelling  Respiratory : Negative for respiratory symptoms  Endocrine: Negative for endocrine symptoms  Musculoskeletal: Back pain  Neurological: Negative for neurological symptoms  Psychologic: Negative for psychiatric symptoms   Fill and Pull Catheter Removal  Patient is present today for a catheter removal.  Patient was cleaned and prepped in a sterile fashion 150 ml of sterile water/ saline was instilled into the bladder when the patient felt the urge to urinate. 10 ml of water was then drained from the balloon.  A 16 FR foley cath was removed from the bladder no complications were noted .  Patient was then given some time to void on their own.  Patient can void  175 ml on their own after some time.  Patient tolerated well.  Performed by: H. Rilee Knoll LPN  Follow up/ Additional notes: I month with pvr

## 2019-09-19 NOTE — Progress Notes (Signed)
09/19/2019 10:57 AM   Arnette Schaumann Veatrice Kells [birth year] [id number]  Referring provider: No referring provider defined for this encounter.  Urinary retention  HPI: John [name] is a 86 yo here for evaluation of urinary retention. Patient was admitted on 3/9 with COVID and had a foley placed for retention. He has mild LUTS prior to hospitalization. He is on flomax and has been for over 5 years.    PMH: Past Medical History:  Diagnosis Date  . Acute ST elevation myocardial infarction (STEMI) of posterior wall ([place]) 11/11/2015  . CKD (chronic kidney disease), stage III   . Coronary atherosclerosis of native coronary artery    a. Multivessel status post CABG in 1992. b. STEMI 11/2015 s/p DES to insertion site of LIMA-LAD.  [name] Dilated aortic root ([place])   . Essential hypertension   . Ischemic cardiomyopathy   . Malignant melanoma of skin   . Mixed hyperlipidemia   . Nephrolithiasis   . OSA (obstructive sleep apnea)   . Rosacea   . Thrombocytopenia [facility])     Surgical History: Past Surgical History:  Procedure Laterality Date  . CARDIAC CATHETERIZATION  07/11/90  . CARDIAC CATHETERIZATION N/A 11/11/2015   Procedure: Left Heart Cath and Coronary Angiography;  Surgeon: [name], MD;  Location: [facility];  Service: Cardiovascular;  Laterality: N/A;  . CARDIAC CATHETERIZATION N/A 11/14/2015   Procedure: Coronary Stent Intervention w/Impella;  Surgeon: [name], MD;  Location: [facility];  Service: Cardiovascular;  Laterality: N/A;  . carotid duplex  06/24/2011, 05/12/2009   Right and Left ICAs: Demonstrates a small amount of irregular mixed density plaque with no evidence of diameter reduction, significant tortuosity or any other vascular abnormality. This is a mildly abnormal carotid duplex doppler evaluation.  [name] CATARACT EXTRACTION W/PHACO Right 02/11/2014   Procedure: CATARACT EXTRACTION PHACO AND INTRAOCULAR LENS PLACEMENT (IOC);  Surgeon: [name], MD;  Location: [facility];  Service: Ophthalmology;  Laterality: Right;  CDE 13.83  . CHOLECYSTECTOMY    . CORONARY ARTERY BYPASS GRAFT  1992  . DOPPLER ECHOCARDIOGRAPHY  05/23/2007   Left ventricular systolic function is normal. The transmitral spectral doppler flow pattern is suggestive of impaired LV relaxation. No significant valvular abnormalities.  . heart monitor  04/01/2009   palpitations  . HERNIA REPAIR    . MELANOMA EXCISION     neck  . NM MYOCAR PERF EJECTION FRACTION  02/10/2011   The post stress myocaardial perfusion images show a normal pattern of perfusion in all regions. No significant wall motion abnormalities noted. EKG is negative for ischemia. This is a low risk scan.  [name] TOTAL KNEE ARTHROPLASTY      Home Medications:  Allergies as of 09/19/2019      Reactions   Statins Other (See Comments)   Weak,nervous   Xanax [alprazolam] Other (See Comments)   Hallucination   Tape Rash      Medication List       Accurate as of September 19, 2019 10:57 AM. If you have any questions, ask your nurse or doctor.        acetaminophen 325 MG tablet Commonly known as: TYLENOL Take 2 tablets (650 mg total) by mouth every 4 (four) hours as needed for headache or mild pain.   ascorbic acid 500 MG tablet Commonly known as: VITAMIN C Take 1 tablet (500 mg total) by mouth daily.   aspirin EC 81 MG tablet Take 81 mg by mouth daily.   cefdinir 300 MG  capsule Commonly known as: OMNICEF Take 1 capsule (300 mg total) by mouth 2 (two) times daily.   clopidogrel 75 MG tablet Commonly known as: PLAVIX Take 1 tablet by mouth once daily with breakfast   clotrimazole-betamethasone cream Commonly known as: LOTRISONE Apply 1 application topically 2 (two) times daily.   Entresto 24-26 MG Generic drug: sacubitril-valsartan Take 1 tablet by mouth 2 (two) times daily.   fludrocortisone 0.1 MG tablet Commonly known as: FLORINEF Take 100 mcg by mouth daily.   furosemide 20 MG tablet Commonly known as:  LASIX Take 1 tablet (20 mg total) by mouth daily.   guaiFENesin-dextromethorphan 100-10 MG/5ML syrup Commonly known as: ROBITUSSIN DM Take 10 mLs by mouth every 4 (four) hours as needed for cough.   ibuprofen 400 MG tablet Commonly known as: ADVIL Take 400 mg by mouth every 6 (six) hours as needed.   Ipratropium-Albuterol 20-100 MCG/ACT Aers respimat Commonly known as: COMBIVENT Inhale 1 puff into the lungs every 6 (six) hours as needed for wheezing.   metoprolol succinate 25 MG 24 hr tablet Commonly known as: TOPROL-XL Take 1 tablet by mouth twice daily   neomycin-polymyxin b-dexamethasone 3.5-10000-0.1 Oint Commonly known as: MAXITROL Place 1 application into the left eye daily.   nitroGLYCERIN 0.4 MG SL tablet Commonly known as: NITROSTAT Place 1 tablet (0.4 mg total) under the tongue every 5 (five) minutes x 3 doses as needed for chest pain.   pantoprazole 40 MG tablet Commonly known as: PROTONIX Take 40 mg by mouth daily.   potassium chloride SA 20 MEQ tablet Commonly known as: Klor-Con M20 Take 2 tablets (40 mEq total) by mouth daily. What changed: how much to take   tamsulosin 0.4 MG Caps capsule Commonly known as: FLOMAX Take 0.4 mg by mouth daily.   zinc sulfate 220 (50 Zn) MG capsule Take 1 capsule (220 mg total) by mouth daily.       Allergies:  Allergies  Allergen Reactions  . Statins Other (See Comments)    Weak,nervous  . Xanax [Alprazolam] Other (See Comments)    Hallucination  . Tape Rash    Family History: Family History  Problem Relation Age of Onset  . Heart attack Father   . Skin cancer Brother   . Heart attack Brother     Social History:  reports that he quit smoking about 38 years ago. His smoking use included cigarettes. He started smoking about 76 years ago. He has never used smokeless tobacco. He reports that he does not drink alcohol or use drugs.  ROS: All other review of systems were reviewed and are negative except what  is noted above in HPI  Physical Exam: BP 129/71   Pulse 75   Temp (!) 96.8 F (36 C)   Ht 5\' 6"  (1.676 m)   Wt 179 lb (81.2 kg)   BMI 28.89 kg/m   Constitutional:  Alert and oriented, No acute distress. HEENT: [place] AT, moist mucus membranes.  Trachea midline, no masses. Cardiovascular: No clubbing, cyanosis, or edema. Respiratory: Normal respiratory effort, no increased work of breathing. GI: Abdomen is soft, nontender, nondistended, no abdominal masses GU: No CVA tenderness Lymph: No cervical or inguinal lymphadenopathy. Skin: No rashes, bruises or suspicious lesions. Neurologic: Grossly intact, no focal deficits, moving all 4 extremities. Psychiatric: Normal mood and affect.  Laboratory Data: Lab Results  Component Value Date   WBC 4.6 09/15/2019   HGB 13.7 09/15/2019   HCT 40.5 09/15/2019   MCV 90.6 09/15/2019  PLT 77 (L) 09/15/2019    Lab Results  Component Value Date   CREATININE 1.04 09/16/2019    No results found for: PSA  No results found for: TESTOSTERONE  Lab Results  Component Value Date   HGBA1C 5.9 (H) 11/11/2015    Urinalysis    Component Value Date/Time   COLORURINE YELLOW 09/11/2019 0913   APPEARANCEUR CLEAR 09/11/2019 0913   LABSPEC 1.013 09/11/2019 0913   PHURINE 6.0 09/11/2019 0913   GLUCOSEU NEGATIVE 09/11/2019 0913   HGBUR SMALL (A) 09/11/2019 0913   BILIRUBINUR NEGATIVE 09/11/2019 0913   KETONESUR NEGATIVE 09/11/2019 0913   PROTEINUR NEGATIVE 09/11/2019 0913   UROBILINOGEN 0.2 11/28/2009 1120   NITRITE NEGATIVE 09/11/2019 0913   LEUKOCYTESUR NEGATIVE 09/11/2019 0913    Lab Results  Component Value Date   BACTERIA RARE (A) 09/11/2019    Pertinent Imaging:  No results found for this or any previous visit. No results found for this or any previous visit. No results found for this or any previous visit. No results found for this or any previous visit. No results found for this or any previous visit. No results found for this  or any previous visit. No results found for this or any previous visit. No results found for this or any previous visit.  Assessment & Plan:    1. Urinary retention Voiding trial passed today. He will followup in 4 weeks with a PVR   No follow-ups on file.  Nicolette Bang, MD  [facility] [place]

## 2019-09-19 NOTE — Progress Notes (Signed)
Urological Symptom Review  Patient is experiencing the following symptoms: Get up at night to urinate  Kidney stones   Review of Systems  Gastrointestinal (upper)  : Negative for upper GI symptoms  Gastrointestinal (lower) : Negative for lower GI symptoms  Constitutional : Negative for symptoms  Skin: Negative for skin symptoms  Eyes: Negative for eye symptoms  Ear/Nose/Throat : Negative for Ear/Nose/Throat symptoms  Hematologic/Lymphatic: Easy bruising  Cardiovascular : Leg swelling  Respiratory : Negative for respiratory symptoms  Endocrine: Negative for endocrine symptoms  Musculoskeletal: Back pain  Neurological: Negative for neurological symptoms  Psychologic: Negative for psychiatric symptoms

## 2019-09-24 DIAGNOSIS — G4733 Obstructive sleep apnea (adult) (pediatric): Secondary | ICD-10-CM | POA: Diagnosis not present

## 2019-09-24 DIAGNOSIS — I2510 Atherosclerotic heart disease of native coronary artery without angina pectoris: Secondary | ICD-10-CM | POA: Diagnosis not present

## 2019-09-24 DIAGNOSIS — K219 Gastro-esophageal reflux disease without esophagitis: Secondary | ICD-10-CM | POA: Diagnosis not present

## 2019-09-24 DIAGNOSIS — E782 Mixed hyperlipidemia: Secondary | ICD-10-CM | POA: Diagnosis not present

## 2019-09-24 DIAGNOSIS — N1832 Chronic kidney disease, stage 3b: Secondary | ICD-10-CM | POA: Diagnosis not present

## 2019-09-24 DIAGNOSIS — I130 Hypertensive heart and chronic kidney disease with heart failure and stage 1 through stage 4 chronic kidney disease, or unspecified chronic kidney disease: Secondary | ICD-10-CM | POA: Diagnosis not present

## 2019-09-24 DIAGNOSIS — U071 COVID-19: Secondary | ICD-10-CM | POA: Diagnosis not present

## 2019-09-24 DIAGNOSIS — I255 Ischemic cardiomyopathy: Secondary | ICD-10-CM | POA: Diagnosis not present

## 2019-09-24 DIAGNOSIS — D696 Thrombocytopenia, unspecified: Secondary | ICD-10-CM | POA: Diagnosis not present

## 2019-09-24 DIAGNOSIS — I5022 Chronic systolic (congestive) heart failure: Secondary | ICD-10-CM | POA: Diagnosis not present

## 2019-09-24 DIAGNOSIS — I252 Old myocardial infarction: Secondary | ICD-10-CM | POA: Diagnosis not present

## 2019-09-24 DIAGNOSIS — J1282 Pneumonia due to coronavirus disease 2019: Secondary | ICD-10-CM | POA: Diagnosis not present

## 2019-09-27 DIAGNOSIS — I130 Hypertensive heart and chronic kidney disease with heart failure and stage 1 through stage 4 chronic kidney disease, or unspecified chronic kidney disease: Secondary | ICD-10-CM | POA: Diagnosis not present

## 2019-09-27 DIAGNOSIS — I5022 Chronic systolic (congestive) heart failure: Secondary | ICD-10-CM | POA: Diagnosis not present

## 2019-09-27 DIAGNOSIS — K219 Gastro-esophageal reflux disease without esophagitis: Secondary | ICD-10-CM | POA: Diagnosis not present

## 2019-09-27 DIAGNOSIS — D696 Thrombocytopenia, unspecified: Secondary | ICD-10-CM | POA: Diagnosis not present

## 2019-09-27 DIAGNOSIS — N1832 Chronic kidney disease, stage 3b: Secondary | ICD-10-CM | POA: Diagnosis not present

## 2019-09-27 DIAGNOSIS — I255 Ischemic cardiomyopathy: Secondary | ICD-10-CM | POA: Diagnosis not present

## 2019-09-27 DIAGNOSIS — J1282 Pneumonia due to coronavirus disease 2019: Secondary | ICD-10-CM | POA: Diagnosis not present

## 2019-09-27 DIAGNOSIS — E782 Mixed hyperlipidemia: Secondary | ICD-10-CM | POA: Diagnosis not present

## 2019-09-27 DIAGNOSIS — G4733 Obstructive sleep apnea (adult) (pediatric): Secondary | ICD-10-CM | POA: Diagnosis not present

## 2019-09-27 DIAGNOSIS — I2510 Atherosclerotic heart disease of native coronary artery without angina pectoris: Secondary | ICD-10-CM | POA: Diagnosis not present

## 2019-09-27 DIAGNOSIS — U071 COVID-19: Secondary | ICD-10-CM | POA: Diagnosis not present

## 2019-09-27 DIAGNOSIS — I252 Old myocardial infarction: Secondary | ICD-10-CM | POA: Diagnosis not present

## 2019-09-30 DIAGNOSIS — I2510 Atherosclerotic heart disease of native coronary artery without angina pectoris: Secondary | ICD-10-CM | POA: Diagnosis not present

## 2019-09-30 DIAGNOSIS — E782 Mixed hyperlipidemia: Secondary | ICD-10-CM | POA: Diagnosis not present

## 2019-09-30 DIAGNOSIS — J1282 Pneumonia due to coronavirus disease 2019: Secondary | ICD-10-CM | POA: Diagnosis not present

## 2019-09-30 DIAGNOSIS — I130 Hypertensive heart and chronic kidney disease with heart failure and stage 1 through stage 4 chronic kidney disease, or unspecified chronic kidney disease: Secondary | ICD-10-CM | POA: Diagnosis not present

## 2019-09-30 DIAGNOSIS — D696 Thrombocytopenia, unspecified: Secondary | ICD-10-CM | POA: Diagnosis not present

## 2019-09-30 DIAGNOSIS — I5022 Chronic systolic (congestive) heart failure: Secondary | ICD-10-CM | POA: Diagnosis not present

## 2019-09-30 DIAGNOSIS — N1832 Chronic kidney disease, stage 3b: Secondary | ICD-10-CM | POA: Diagnosis not present

## 2019-09-30 DIAGNOSIS — I252 Old myocardial infarction: Secondary | ICD-10-CM | POA: Diagnosis not present

## 2019-09-30 DIAGNOSIS — G4733 Obstructive sleep apnea (adult) (pediatric): Secondary | ICD-10-CM | POA: Diagnosis not present

## 2019-09-30 DIAGNOSIS — I255 Ischemic cardiomyopathy: Secondary | ICD-10-CM | POA: Diagnosis not present

## 2019-09-30 DIAGNOSIS — U071 COVID-19: Secondary | ICD-10-CM | POA: Diagnosis not present

## 2019-09-30 DIAGNOSIS — K219 Gastro-esophageal reflux disease without esophagitis: Secondary | ICD-10-CM | POA: Diagnosis not present

## 2019-10-02 ENCOUNTER — Telehealth: Payer: Self-pay

## 2019-10-02 DIAGNOSIS — U071 COVID-19: Secondary | ICD-10-CM | POA: Diagnosis not present

## 2019-10-02 DIAGNOSIS — I255 Ischemic cardiomyopathy: Secondary | ICD-10-CM | POA: Diagnosis not present

## 2019-10-02 DIAGNOSIS — K219 Gastro-esophageal reflux disease without esophagitis: Secondary | ICD-10-CM | POA: Diagnosis not present

## 2019-10-02 DIAGNOSIS — G4733 Obstructive sleep apnea (adult) (pediatric): Secondary | ICD-10-CM | POA: Diagnosis not present

## 2019-10-02 DIAGNOSIS — I252 Old myocardial infarction: Secondary | ICD-10-CM | POA: Diagnosis not present

## 2019-10-02 DIAGNOSIS — I130 Hypertensive heart and chronic kidney disease with heart failure and stage 1 through stage 4 chronic kidney disease, or unspecified chronic kidney disease: Secondary | ICD-10-CM | POA: Diagnosis not present

## 2019-10-02 DIAGNOSIS — J1282 Pneumonia due to coronavirus disease 2019: Secondary | ICD-10-CM | POA: Diagnosis not present

## 2019-10-02 DIAGNOSIS — N1832 Chronic kidney disease, stage 3b: Secondary | ICD-10-CM | POA: Diagnosis not present

## 2019-10-02 DIAGNOSIS — I5022 Chronic systolic (congestive) heart failure: Secondary | ICD-10-CM | POA: Diagnosis not present

## 2019-10-02 DIAGNOSIS — D696 Thrombocytopenia, unspecified: Secondary | ICD-10-CM | POA: Diagnosis not present

## 2019-10-02 DIAGNOSIS — E782 Mixed hyperlipidemia: Secondary | ICD-10-CM | POA: Diagnosis not present

## 2019-10-02 DIAGNOSIS — I2510 Atherosclerotic heart disease of native coronary artery without angina pectoris: Secondary | ICD-10-CM | POA: Diagnosis not present

## 2019-10-02 NOTE — Telephone Encounter (Signed)
Spoke to patient's granddaughter Sharyn Lull and let her know that Dr. Alen Blew has been made aware and will look at the records and advise the patient accordingly once they are received from Dr. Nevada Crane. Sharyn Lull stated she will contact Dr. Nevada Crane and have records faxed. Ila Mcgill fax number to Dr. Hazeline Junker nurse [phone]. She verbalized understanding.

## 2019-10-02 NOTE — Telephone Encounter (Signed)
-----   Message from Wyatt Portela, MD sent at 10/02/2019 12:51 PM EDT ----- Once we have the records from his dermatologist office I will look at it and advised the patient accordingly.  Thanks ----- Message ----- From: Tami Lin, RN Sent: 10/02/2019  12:37 PM EDT To: Wyatt Portela, MD  Patient's granddaughter called and stated patient recently had a melanoma removed in his right arm by Dr. Allyn Kenner- dermatologist in [place]. Per granddaughter it was not completely removed and Dr. Nevada Crane sent patient to [facility] but the physician did not do anything. She states that was a wasted trip. She is going to contact Dr. Nevada Crane again for complete removal. Per granddaughter patient trusts you and your opinion and would like to have a visit with you in the next week or two to get your opinion about further testing, etc. His next visit is scheduled for 01/18/20. Patient's granddaughter is going to contact Dr. Juel Burrow office and have notes faxed over.  Lanelle Bal

## 2019-10-03 ENCOUNTER — Other Ambulatory Visit: Payer: Self-pay

## 2019-10-03 ENCOUNTER — Encounter: Payer: Self-pay | Admitting: Student

## 2019-10-03 ENCOUNTER — Ambulatory Visit: Payer: Medicare Other | Admitting: Student

## 2019-10-03 VITALS — BP 134/76 | HR 70 | Temp 98.0°F | Ht 66.0 in | Wt 190.4 lb

## 2019-10-03 DIAGNOSIS — I5042 Chronic combined systolic (congestive) and diastolic (congestive) heart failure: Secondary | ICD-10-CM | POA: Diagnosis not present

## 2019-10-03 DIAGNOSIS — I2510 Atherosclerotic heart disease of native coronary artery without angina pectoris: Secondary | ICD-10-CM | POA: Diagnosis not present

## 2019-10-03 DIAGNOSIS — M7989 Other specified soft tissue disorders: Secondary | ICD-10-CM | POA: Diagnosis not present

## 2019-10-03 DIAGNOSIS — I10 Essential (primary) hypertension: Secondary | ICD-10-CM | POA: Diagnosis not present

## 2019-10-03 DIAGNOSIS — Z79899 Other long term (current) drug therapy: Secondary | ICD-10-CM

## 2019-10-03 NOTE — Patient Instructions (Signed)
Medication Instructions:  No changes. Try taking Tylenol instead of Aleve or Motrin.   Labwork: BMET at time of Ultrasound  Testing/Procedures: Lower Extremity Doppler Study to rule-out DVT  Follow-Up: Keep scheduled follow-up with Dr. Domenic Polite in 11/2019  Any Other Special Instructions Will Be Listed Below (If Applicable).     If you need a refill on your cardiac medications before your next appointment, please call your pharmacy.

## 2019-10-03 NOTE — Progress Notes (Signed)
Cardiology Office Note    Date:  10/03/2019   ID:  NIKITAS SEEPERSAD, DOB 09/28/1929, MRN [id number]  PCP:  Patient, No Pcp Per  Cardiologist: [name], MD    Chief Complaint  Patient presents with  . Hospitalization Follow-up    History of Present Illness:    BALIAN CHAUDOIN is a 86 y.o. male with past medical history of CAD (s/p CABG in 1992, STEMI in 11/2015 with DES to LIMA-LAD insertion site), chronic combined systolic and diastolic CHF/ICM (EF [id number] in 11/2015, at 25-30% by repeat echo in 06/2019), HTN, HLD, OSA and chronic thrombocytopenia who presents to the office today for hospital follow-up.   He was last examined by Dr. Domenic Polite in 07/2019 and denied any anginal symptoms at that time. Was continued on ASA 81mg  daily, Plavix 75mg  daily, Lasix 20mg  daily, Toprol-XL 25mg  BID, and Entresto 24-26mg  BID. Was not on statin therapy due to intolerances in the past.   In the interim, he was admitted to [facility] from 3/9 - 09/15/2019 for evaluation of worsening weakness, fatigue and dyspnea. He was found to have sepsis in the setting of COVID-19 PNA. He was treated with antibiotic therapy along with IV steroids and Remdesivir. HS Troponin values peaked at 110 and were thought to be elevated secondary to demand ischemia. [name] was held temporarily during admission due to AKI but resumed upon discharge (creatinine stable at 1.04 on 09/16/2019).    In talking with the patient and his family member today, he reports his breathing has significantly improved since hospital discharge. He denies any dyspnea on exertion, orthopnea or PND. No recent chest pain or palpitations. He does have intermittent lower extremity edema and has noticed worsening swelling along his left calf. No associated pain.   He reports good compliance with his medications but is concerned about the long-term cost of Entresto as his co-pay was $140 for the current month.    Past Medical History:  Diagnosis  Date  . Acute ST elevation myocardial infarction (STEMI) of posterior wall ([place]) 11/11/2015  . CKD (chronic kidney disease), stage III   . Coronary atherosclerosis of native coronary artery    a. Multivessel status post CABG in 1992. b. STEMI 11/2015 s/p DES to insertion site of LIMA-LAD.  [name] Dilated aortic root ([place])   . Essential hypertension   . Ischemic cardiomyopathy   . Malignant melanoma of skin   . Mixed hyperlipidemia   . Nephrolithiasis   . OSA (obstructive sleep apnea)   . Rosacea   . Thrombocytopenia ([place])     Past Surgical History:  Procedure Laterality Date  . CARDIAC CATHETERIZATION  07/11/90  . CARDIAC CATHETERIZATION N/A 11/11/2015   Procedure: Left Heart Cath and Coronary Angiography;  Surgeon: [name], MD;  Location: [place] [facility];  Service: Cardiovascular;  Laterality: N/A;  . CARDIAC CATHETERIZATION N/A 11/14/2015   Procedure: Coronary Stent Intervention w/Impella;  Surgeon: [name], MD;  Location: [place] [facility];  Service: Cardiovascular;  Laterality: N/A;  . carotid duplex  06/24/2011, 05/12/2009   Right and Left ICAs: Demonstrates a small amount of irregular mixed density plaque with no evidence of diameter reduction, significant tortuosity or any other vascular abnormality. This is a mildly abnormal carotid duplex doppler evaluation.  [name] CATARACT EXTRACTION W/PHACO Right 02/11/2014   Procedure: CATARACT EXTRACTION PHACO AND INTRAOCULAR LENS PLACEMENT (IOC);  Surgeon: [name], MD;  Location: [facility];  Service: Ophthalmology;  Laterality: Right;  CDE 13.83  . CHOLECYSTECTOMY    .  CORONARY ARTERY BYPASS GRAFT  1992  . DOPPLER ECHOCARDIOGRAPHY  05/23/2007   Left ventricular systolic function is normal. The transmitral spectral doppler flow pattern is suggestive of impaired LV relaxation. No significant valvular abnormalities.  . heart monitor  04/01/2009   palpitations  . HERNIA REPAIR    . MELANOMA EXCISION     neck  . NM MYOCAR PERF EJECTION  FRACTION  02/10/2011   The post stress myocaardial perfusion images show a normal pattern of perfusion in all regions. No significant wall motion abnormalities noted. EKG is negative for ischemia. This is a low risk scan.  [name] TOTAL KNEE ARTHROPLASTY      Current Medications: Outpatient Medications Prior to Visit  Medication Sig Dispense Refill  . ascorbic acid (VITAMIN C) 500 MG tablet Take 1 tablet (500 mg total) by mouth daily. 30 tablet 0  . aspirin EC 81 MG tablet Take 81 mg by mouth daily.    . clopidogrel (PLAVIX) 75 MG tablet Take 1 tablet by mouth once daily with breakfast 90 tablet 3  . clotrimazole-betamethasone (LOTRISONE) cream Apply 1 application topically 2 (two) times daily.     . fludrocortisone (FLORINEF) 0.1 MG tablet Take 100 mcg by mouth daily.    . furosemide (LASIX) 20 MG tablet Take 1 tablet (20 mg total) by mouth daily. 90 tablet 3  . ibuprofen (ADVIL) 400 MG tablet Take 400 mg by mouth every 6 (six) hours as needed.    . metoprolol succinate (TOPROL-XL) 25 MG 24 hr tablet Take 1 tablet by mouth twice daily 180 tablet 0  . neomycin-polymyxin b-dexamethasone (MAXITROL) 3.5-10000-0.1 OINT Place 1 application into the left eye daily.     . nitroGLYCERIN (NITROSTAT) 0.4 MG SL tablet Place 1 tablet (0.4 mg total) under the tongue every 5 (five) minutes x 3 doses as needed for chest pain. 25 tablet 2  . pantoprazole (PROTONIX) 40 MG tablet Take 40 mg by mouth daily.    . potassium chloride SA (KLOR-CON M20) 20 MEQ tablet Take 2 tablets (40 mEq total) by mouth daily. (Patient taking differently: Take 20 mEq by mouth daily. ) 180 tablet 3  . sacubitril-valsartan (ENTRESTO) 24-26 MG Take 1 tablet by mouth 2 (two) times daily. 60 tablet 6  . tamsulosin (FLOMAX) 0.4 MG CAPS capsule Take 0.4 mg by mouth daily.    [name] zinc sulfate 220 (50 Zn) MG capsule Take 1 capsule (220 mg total) by mouth daily. 30 capsule 0  . acetaminophen (TYLENOL) 325 MG tablet Take 2 tablets (650 mg total) by  mouth every 4 (four) hours as needed for headache or mild pain.    . cefdinir (OMNICEF) 300 MG capsule Take 1 capsule (300 mg total) by mouth 2 (two) times daily. 3 capsule 0  . guaiFENesin-dextromethorphan (ROBITUSSIN DM) 100-10 MG/5ML syrup Take 10 mLs by mouth every 4 (four) hours as needed for cough. 118 mL 0  . Ipratropium-Albuterol (COMBIVENT) 20-100 MCG/ACT AERS respimat Inhale 1 puff into the lungs every 6 (six) hours as needed for wheezing. 4 g 1   No facility-administered medications prior to visit.     Allergies:   Statins, Xanax [alprazolam], and Tape   Social History   Socioeconomic History  . Marital status: Widowed    Spouse name: Not on file  . Number of children: Not on file  . Years of education: Not on file  . Highest education level: Not on file  Occupational History  . Not on file  Tobacco Use  .  Smoking status: Former Smoker    Types: Cigarettes    Start date: 07/06/1943    Quit date: 07/05/1981    Years since quitting: 38.2  . Smokeless tobacco: Never Used  Substance and Sexual Activity  . Alcohol use: No    Alcohol/week: 0.0 standard drinks  . Drug use: No  . Sexual activity: Yes    Partners: Female  Other Topics Concern  . Not on file  Social History Narrative  . Not on file   Social Determinants of Health   Financial Resource Strain:   . Difficulty of Paying Living Expenses:   Food Insecurity:   . Worried About Charity fundraiser in the Last Year:   . Arboriculturist in the Last Year:   Transportation Needs:   . Film/video editor (Medical):   [name] Lack of Transportation (Non-Medical):   Physical Activity:   . Days of Exercise per Week:   . Minutes of Exercise per Session:   Stress:   . Feeling of Stress :   Social Connections:   . Frequency of Communication with Friends and Family:   . Frequency of Social Gatherings with Friends and Family:   . Attends Religious Services:   . Active Member of Clubs or Organizations:   . Attends English as a second language teacher Meetings:   [name] Marital Status:      Family History:  The patient's family history includes Heart attack in his brother and father; Skin cancer in his brother.   Review of Systems:   Please see the history of present illness.     General:  No chills, fever, night sweats or weight changes.  Cardiovascular:  No chest pain, dyspnea on exertion,  orthopnea, palpitations, paroxysmal nocturnal dyspnea. Positive for edema.  Dermatological: No rash, lesions/masses Respiratory: No cough, dyspnea Urologic: No hematuria, dysuria Abdominal:   No nausea, vomiting, diarrhea, bright red blood per rectum, melena, or hematemesis Neurologic:  No visual changes, wkns, changes in mental status. All other systems reviewed and are otherwise negative except as noted above.   Physical Exam:    VS:  BP 134/76   Pulse 70   Temp 98 F (36.7 C)   Ht 5\' 6"  (1.676 m)   Wt 190 lb 6.4 oz (86.4 kg)   SpO2 97%   BMI 30.73 kg/m    General: Well developed, well nourished,male appearing in no acute distress. Hard of hearing.  Head: Normocephalic, atraumatic, sclera non-icteric.  Neck: No carotid bruits. JVD not elevated.  Lungs: Respirations regular and unlabored, without wheezes or rales.  Heart: Regular rate and rhythm. No S3 or S4.  No murmur, no rubs, or gallops appreciated. Abdomen: Soft, non-tender, non-distended. No obvious abdominal masses. Msk:  Strength and tone appear normal for age. No obvious joint deformities or effusions. Extremities: No clubbing or cyanosis. Trace lower extremity edema along RLE but he does have isolated swelling along his left calf.  Distal pedal pulses are 2+ bilaterally. Neuro: Alert and oriented X 3. Moves all extremities spontaneously. No focal deficits noted. Psych:  Responds to questions appropriately with a normal affect. Skin: No rashes or lesions noted  Wt Readings from Last 3 Encounters:  10/03/19 190 lb 6.4 oz (86.4 kg)  09/19/19 179 lb (81.2 kg)    09/11/19 179 lb (81.2 kg)     Studies/Labs Reviewed:   EKG:  EKG is not ordered today.   Recent Labs: 09/11/2019: B Natriuretic Peptide 1,832.0; TSH 1.337 09/15/2019: Hemoglobin 13.7; Platelets 77  09/16/2019: ALT 51; BUN 13; Creatinine, Ser 1.04; Magnesium 1.9; Potassium 3.7; Sodium 139   Lipid Panel    Component Value Date/Time   CHOL 170 11/12/2015 0432   TRIG 54 09/11/2019 1409   HDL 28 (L) 11/12/2015 0432   CHOLHDL 6.1 11/12/2015 0432   VLDL 41 (H) 11/12/2015 0432   LDLCALC 101 (H) 11/12/2015 0432    Additional studies/ records that were reviewed today include:   Coronary Stent Intervention with Impella: 11/2015  Sequential SVG .  The first limb of the sequential SVG-diagonal/ramus intermedius is patent, but the second limb is occluded.  Origin lesion, before 1st Diag, 40% stenosed.  Prox RCA to Mid RCA lesion, 100% stenosed.  LIMA .  The LIMA is patent, but there is a tight lesion just beyond the insertion site in the mid-LAD  Ost LM lesion, 100% stenosed.  Ost Cx lesion, 100% stenosed.  Mid LAD to Dist LAD lesion, 90% stenosed. Post intervention, there is a 0% residual stenosis.   Severe stenosis of the mid LAD at the LIMA insertion site, treated successfully with PCI using a single drug-eluting stent and hemodynamic support with an Impella CP device.  Echocardiogram: 06/2019 IMPRESSIONS    1. Left ventricular ejection fraction, by visual estimation, is 25 to  30%. The left ventricle has moderate to severely decreased function. There  is no left ventricular hypertrophy.  2. Severely dilated left ventricular internal cavity size.  3. The left ventricle demonstrates global hypokinesis.  4. Global right ventricle has normal systolic function.The right  ventricular size is normal. No increase in right ventricular wall  thickness.  5. Left atrial size was mildly dilated.  6. Right atrial size was normal.  7. The mitral valve is normal in structure.  Mild mitral valve  regurgitation.  8. The tricuspid valve is normal in structure. Tricuspid valve  regurgitation is mild.  9. The aortic valve is tricuspid. Aortic valve regurgitation is mild.  10. Pulmonic regurgitation is mild.  11. The pulmonic valve was not well visualized. Pulmonic valve  regurgitation is mild.  12. The inferior vena cava is normal in size with greater than 50%  respiratory variability, suggesting right atrial pressure of 3 mmHg.   Assessment:    1. Coronary artery disease involving native coronary artery of native heart without angina pectoris   2. Chronic combined systolic and diastolic heart failure ([place])   3. Left leg swelling   4. Medication management   5. Essential hypertension      Plan:   In order of problems listed above:  1. CAD - he is s/p CABG in 1992 with STEMI in 11/2015 with DES to LIMA-LAD insertion site. He denies any chest pain or dyspnea on exertion.  - continue current medication regimen with ASA 81mg  daily, Plavix 75mg  daily and Toprol-XL 25mg  BID. He has been intolerant to statin therapy in the past and is not interested in alternatives at this time.   2. Chronic Combined Systolic and Diastolic CHF/ICM - EF previously reduced at 40-45% in 11/2015, at 25-30% by repeat echo in 06/2019. - he denies any recent orthopnea or PND and his respiratory status has significantly improved since his recent hospitalization.  - continue Toprol-XL 25mg  BID and Entresto 24-26mg  BID. Pending repeat labs, would consider further titration of Entresto or addition of Spironolactone. He is concerned about the cost of this and has completed patient assistance paperwork but has not heard back. I will follow-up with office staff in regards to this.  Recheck BMET today given AKI during recent admission.   3. Left Leg Swelling - he does have isolated swelling along his left leg which is prominent when compared to his right leg. Given his recent COVID-19 diagnosis  and increased risk of a thromboembolic event, will order a doppler study to rule out a DVT.   4. HTN - BP is well-controlled at 134/76 during today's visit. Continue current medication regimen.    Medication Adjustments/Labs and Tests Ordered: Current medicines are reviewed at length with the patient today.  Concerns regarding medicines are outlined above.  Medication changes, Labs and Tests ordered today are listed in the Patient Instructions below. Patient Instructions  Medication Instructions:  No changes. Try taking Tylenol instead of Aleve or Motrin.   Labwork: BMET at time of Ultrasound  Testing/Procedures: Lower Extremity Doppler Study to rule-out DVT  Follow-Up: Keep scheduled follow-up with Dr. Domenic Polite in 11/2019  Any Other Special Instructions Will Be Listed Below (If Applicable).   If you need a refill on your cardiac medications before your next appointment, please call your pharmacy.    Signed, Erma Heritage, PA-C  10/03/2019 6:04 PM    [place] [address] [place], [place] [address] Phone: [phone] Fax: [phone]

## 2019-10-08 DIAGNOSIS — U071 COVID-19: Secondary | ICD-10-CM | POA: Diagnosis not present

## 2019-10-08 DIAGNOSIS — I5022 Chronic systolic (congestive) heart failure: Secondary | ICD-10-CM | POA: Diagnosis not present

## 2019-10-08 DIAGNOSIS — J1282 Pneumonia due to coronavirus disease 2019: Secondary | ICD-10-CM | POA: Diagnosis not present

## 2019-10-08 DIAGNOSIS — N1832 Chronic kidney disease, stage 3b: Secondary | ICD-10-CM | POA: Diagnosis not present

## 2019-10-08 DIAGNOSIS — I2510 Atherosclerotic heart disease of native coronary artery without angina pectoris: Secondary | ICD-10-CM | POA: Diagnosis not present

## 2019-10-08 DIAGNOSIS — I130 Hypertensive heart and chronic kidney disease with heart failure and stage 1 through stage 4 chronic kidney disease, or unspecified chronic kidney disease: Secondary | ICD-10-CM | POA: Diagnosis not present

## 2019-10-08 DIAGNOSIS — K219 Gastro-esophageal reflux disease without esophagitis: Secondary | ICD-10-CM | POA: Diagnosis not present

## 2019-10-08 DIAGNOSIS — I255 Ischemic cardiomyopathy: Secondary | ICD-10-CM | POA: Diagnosis not present

## 2019-10-08 DIAGNOSIS — I252 Old myocardial infarction: Secondary | ICD-10-CM | POA: Diagnosis not present

## 2019-10-08 DIAGNOSIS — D696 Thrombocytopenia, unspecified: Secondary | ICD-10-CM | POA: Diagnosis not present

## 2019-10-08 DIAGNOSIS — G4733 Obstructive sleep apnea (adult) (pediatric): Secondary | ICD-10-CM | POA: Diagnosis not present

## 2019-10-08 DIAGNOSIS — E782 Mixed hyperlipidemia: Secondary | ICD-10-CM | POA: Diagnosis not present

## 2019-10-09 ENCOUNTER — Other Ambulatory Visit: Payer: Self-pay

## 2019-10-09 ENCOUNTER — Ambulatory Visit (HOSPITAL_COMMUNITY)
Admission: RE | Admit: 2019-10-09 | Discharge: 2019-10-09 | Disposition: A | Discharge status: Home / Self Care | Payer: Medicare Other | Source: Ambulatory Visit | Attending: Student | Admitting: Student

## 2019-10-09 ENCOUNTER — Other Ambulatory Visit (HOSPITAL_COMMUNITY)
Admission: RE | Admit: 2019-10-09 | Discharge: 2019-10-09 | Disposition: A | Discharge status: Home / Self Care | Payer: Medicare Other | Source: Ambulatory Visit | Attending: Student | Admitting: Student

## 2019-10-09 DIAGNOSIS — M7989 Other specified soft tissue disorders: Secondary | ICD-10-CM | POA: Diagnosis not present

## 2019-10-09 DIAGNOSIS — R600 Localized edema: Secondary | ICD-10-CM | POA: Diagnosis not present

## 2019-10-09 LAB — BASIC METABOLIC PANEL
Anion gap: 10 (ref 5–15)
BUN: 12 mg/dL (ref 8–23)
CO2: 30 mmol/L (ref 22–32)
Calcium: 8.8 mg/dL — ABNORMAL LOW (ref 8.9–10.3)
Chloride: 102 mmol/L (ref 98–111)
Creatinine, Ser: 1.07 mg/dL (ref 0.61–1.24)
GFR calc Af Amer: >60 mL/min (ref >60)
GFR calc non Af Amer: >60 mL/min (ref >60)
Glucose, Bld: 101 mg/dL — ABNORMAL HIGH (ref 70–99)
Potassium: 3 mmol/L — ABNORMAL LOW (ref 3.5–5.1)
Sodium: 142 mmol/L (ref 135–145)

## 2019-10-11 DIAGNOSIS — I255 Ischemic cardiomyopathy: Secondary | ICD-10-CM | POA: Diagnosis not present

## 2019-10-11 DIAGNOSIS — D696 Thrombocytopenia, unspecified: Secondary | ICD-10-CM | POA: Diagnosis not present

## 2019-10-11 DIAGNOSIS — I5022 Chronic systolic (congestive) heart failure: Secondary | ICD-10-CM | POA: Diagnosis not present

## 2019-10-11 DIAGNOSIS — K219 Gastro-esophageal reflux disease without esophagitis: Secondary | ICD-10-CM | POA: Diagnosis not present

## 2019-10-11 DIAGNOSIS — I2510 Atherosclerotic heart disease of native coronary artery without angina pectoris: Secondary | ICD-10-CM | POA: Diagnosis not present

## 2019-10-11 DIAGNOSIS — J1282 Pneumonia due to coronavirus disease 2019: Secondary | ICD-10-CM | POA: Diagnosis not present

## 2019-10-11 DIAGNOSIS — N1832 Chronic kidney disease, stage 3b: Secondary | ICD-10-CM | POA: Diagnosis not present

## 2019-10-11 DIAGNOSIS — E782 Mixed hyperlipidemia: Secondary | ICD-10-CM | POA: Diagnosis not present

## 2019-10-11 DIAGNOSIS — U071 COVID-19: Secondary | ICD-10-CM | POA: Diagnosis not present

## 2019-10-11 DIAGNOSIS — I252 Old myocardial infarction: Secondary | ICD-10-CM | POA: Diagnosis not present

## 2019-10-11 DIAGNOSIS — G4733 Obstructive sleep apnea (adult) (pediatric): Secondary | ICD-10-CM | POA: Diagnosis not present

## 2019-10-11 DIAGNOSIS — I130 Hypertensive heart and chronic kidney disease with heart failure and stage 1 through stage 4 chronic kidney disease, or unspecified chronic kidney disease: Secondary | ICD-10-CM | POA: Diagnosis not present

## 2019-10-12 ENCOUNTER — Telehealth: Payer: Self-pay | Admitting: *Deleted

## 2019-10-12 MED ORDER — POTASSIUM CHLORIDE CRYS ER 20 MEQ PO TBCR: 40.0000 meq | EXTENDED_RELEASE_TABLET | Freq: Every day | ORAL | 3 refills | Status: AC

## 2019-10-12 NOTE — Telephone Encounter (Signed)
-----   Message from Erma Heritage, [place] sent at 10/09/2019  4:47 PM EDT ----- Please let the patient know his kidney function has remained stable.Potassium is low at 3.0.  It is prescribed as he should be taking K-dur 40 mEq daily but was listed as he was only taking 20 mEq daily at the time of his office visit. Please have him increase this to 40 mEq daily. I understand he is awaiting approval for Entresto. If approved, would consider further titration to 49-51mg  BID given his stable labs and known cardiomyopathy.

## 2019-10-18 DIAGNOSIS — C4361 Malignant melanoma of right upper limb, including shoulder: Secondary | ICD-10-CM | POA: Diagnosis not present

## 2019-10-24 ENCOUNTER — Ambulatory Visit: Payer: Medicare Other | Admitting: Urology

## 2019-10-24 ENCOUNTER — Encounter: Payer: Self-pay | Admitting: Urology

## 2019-10-24 ENCOUNTER — Other Ambulatory Visit: Payer: Self-pay

## 2019-10-24 VITALS — BP 173/87 | HR 61 | Temp 97.7°F | Ht 66.0 in | Wt 186.0 lb

## 2019-10-24 DIAGNOSIS — N138 Other obstructive and reflux uropathy: Secondary | ICD-10-CM | POA: Diagnosis not present

## 2019-10-24 DIAGNOSIS — D0361 Melanoma in situ of right upper limb, including shoulder: Secondary | ICD-10-CM | POA: Diagnosis not present

## 2019-10-24 DIAGNOSIS — N401 Enlarged prostate with lower urinary tract symptoms: Secondary | ICD-10-CM

## 2019-10-24 DIAGNOSIS — R339 Retention of urine, unspecified: Secondary | ICD-10-CM

## 2019-10-24 DIAGNOSIS — C4361 Malignant melanoma of right upper limb, including shoulder: Secondary | ICD-10-CM | POA: Diagnosis not present

## 2019-10-24 LAB — BLADDER SCAN AMB NON-IMAGING: Scan Result: 31.2

## 2019-10-24 MED ORDER — TAMSULOSIN HCL 0.4 MG PO CAPS: 0.4000 mg | ORAL_CAPSULE | Freq: Every day | ORAL | 3 refills | Status: DC

## 2019-10-24 NOTE — Progress Notes (Signed)
10/24/2019 12:15 PM   John [name] 12-04-1929 [id number]  Referring provider: No referring provider defined for this encounter.  Urinary retetnion  HPI: John [name] is a 86yo here for followup for BPH with urinary retention. He passed his voiding trial 1 month ago. No worsening LUTS. No dysuria. Patient is on flomax 0.4mg  daily. PVR 31cc   PMH: Past Medical History:  Diagnosis Date  . Acute ST elevation myocardial infarction (STEMI) of posterior wall ([place]) 11/11/2015  . CKD (chronic kidney disease), stage III   . Coronary atherosclerosis of native coronary artery    a. Multivessel status post CABG in 1992. b. STEMI 11/2015 s/p DES to insertion site of LIMA-LAD.  [name] Dilated aortic root ([place])   . Essential hypertension   . Ischemic cardiomyopathy   . Malignant melanoma of skin   . Mixed hyperlipidemia   . Nephrolithiasis   . OSA (obstructive sleep apnea)   . Rosacea   . Thrombocytopenia [facility])     Surgical History: Past Surgical History:  Procedure Laterality Date  . CARDIAC CATHETERIZATION  07/11/90  . CARDIAC CATHETERIZATION N/A 11/11/2015   Procedure: Left Heart Cath and Coronary Angiography;  Surgeon: [name], MD;  Location: [place] [facility];  Service: Cardiovascular;  Laterality: N/A;  . CARDIAC CATHETERIZATION N/A 11/14/2015   Procedure: Coronary Stent Intervention w/Impella;  Surgeon: [name], MD;  Location: [place] [facility];  Service: Cardiovascular;  Laterality: N/A;  . carotid duplex  06/24/2011, 05/12/2009   Right and Left ICAs: Demonstrates a small amount of irregular mixed density plaque with no evidence of diameter reduction, significant tortuosity or any other vascular abnormality. This is a mildly abnormal carotid duplex doppler evaluation.  [name] CATARACT EXTRACTION W/PHACO Right 02/11/2014   Procedure: CATARACT EXTRACTION PHACO AND INTRAOCULAR LENS PLACEMENT (IOC);  Surgeon: [name], MD;  Location: [facility];  Service: Ophthalmology;  Laterality:  Right;  CDE 13.83  . CHOLECYSTECTOMY    . CORONARY ARTERY BYPASS GRAFT  1992  . DOPPLER ECHOCARDIOGRAPHY  05/23/2007   Left ventricular systolic function is normal. The transmitral spectral doppler flow pattern is suggestive of impaired LV relaxation. No significant valvular abnormalities.  . heart monitor  04/01/2009   palpitations  . HERNIA REPAIR    . MELANOMA EXCISION     neck  . NM MYOCAR PERF EJECTION FRACTION  02/10/2011   The post stress myocaardial perfusion images show a normal pattern of perfusion in all regions. No significant wall motion abnormalities noted. EKG is negative for ischemia. This is a low risk scan.  [name] TOTAL KNEE ARTHROPLASTY      Home Medications:  Allergies as of 10/24/2019      Reactions   Statins Other (See Comments)   Weak,nervous   Xanax [alprazolam] Other (See Comments)   Hallucination   Tape Rash      Medication List       Accurate as of October 24, 2019 12:15 PM. If you have any questions, ask your nurse or doctor.        aspirin EC 81 MG tablet Take 81 mg by mouth daily.   clopidogrel 75 MG tablet Commonly known as: PLAVIX Take 1 tablet by mouth once daily with breakfast   clotrimazole-betamethasone cream Commonly known as: LOTRISONE Apply 1 application topically 2 (two) times daily.   Entresto 24-26 MG Generic drug: sacubitril-valsartan Take 1 tablet by mouth 2 (two) times daily.   fludrocortisone 0.1 MG tablet Commonly known as: FLORINEF Take 100 mcg by  mouth daily.   furosemide 20 MG tablet Commonly known as: LASIX Take 1 tablet (20 mg total) by mouth daily.   ibuprofen 400 MG tablet Commonly known as: ADVIL Take 400 mg by mouth every 6 (six) hours as needed.   metoprolol succinate 25 MG 24 hr tablet Commonly known as: TOPROL-XL Take 1 tablet by mouth twice daily   neomycin-polymyxin b-dexamethasone 3.5-10000-0.1 Oint Commonly known as: MAXITROL Place 1 application into the left eye daily.   nitroGLYCERIN 0.4 MG SL  tablet Commonly known as: NITROSTAT Place 1 tablet (0.4 mg total) under the tongue every 5 (five) minutes x 3 doses as needed for chest pain.   pantoprazole 40 MG tablet Commonly known as: PROTONIX Take 40 mg by mouth daily.   potassium chloride SA 20 MEQ tablet Commonly known as: KLOR-CON Take 2 tablets (40 mEq total) by mouth daily.   tamsulosin 0.4 MG Caps capsule Commonly known as: FLOMAX Take 1 capsule (0.4 mg total) by mouth daily.       Allergies:  Allergies  Allergen Reactions  . Statins Other (See Comments)    Weak,nervous  . Xanax [Alprazolam] Other (See Comments)    Hallucination  . Tape Rash    Family History: Family History  Problem Relation Age of Onset  . Heart attack Father   . Skin cancer Brother   . Heart attack Brother     Social History:  reports that he quit smoking about 38 years ago. His smoking use included cigarettes. He started smoking about 76 years ago. He has never used smokeless tobacco. He reports that he does not drink alcohol or use drugs.  ROS: All other review of systems were reviewed and are negative except what is noted above in HPI  Physical Exam: BP (!) 173/87   Pulse 61   Temp 97.7 F (36.5 C)   Ht 5\' 6"  (1.676 m)   Wt 186 lb (84.4 kg)   BMI 30.02 kg/m   Constitutional:  Alert and oriented, No acute distress. HEENT: [place] AT, moist mucus membranes.  Trachea midline, no masses. Cardiovascular: No clubbing, cyanosis, or edema. Respiratory: Normal respiratory effort, no increased work of breathing. GI: Abdomen is soft, nontender, nondistended, no abdominal masses GU: No CVA tenderness.  Lymph: No cervical or inguinal lymphadenopathy. Skin: No rashes, bruises or suspicious lesions. Neurologic: Grossly intact, no focal deficits, moving all 4 extremities. Psychiatric: Normal mood and affect.  Laboratory Data: Lab Results  Component Value Date   WBC 4.6 09/15/2019   HGB 13.7 09/15/2019   HCT 40.5 09/15/2019   MCV 90.6  09/15/2019   PLT 77 (L) 09/15/2019    Lab Results  Component Value Date   CREATININE 1.07 10/09/2019    No results found for: PSA  No results found for: TESTOSTERONE  Lab Results  Component Value Date   HGBA1C 5.9 (H) 11/11/2015    Urinalysis    Component Value Date/Time   COLORURINE YELLOW 09/11/2019 0913   APPEARANCEUR CLEAR 09/11/2019 0913   LABSPEC 1.013 09/11/2019 0913   PHURINE 6.0 09/11/2019 0913   GLUCOSEU NEGATIVE 09/11/2019 0913   HGBUR SMALL (A) 09/11/2019 0913   BILIRUBINUR NEGATIVE 09/11/2019 0913   KETONESUR NEGATIVE 09/11/2019 0913   PROTEINUR NEGATIVE 09/11/2019 0913   UROBILINOGEN 0.2 11/28/2009 1120   NITRITE NEGATIVE 09/11/2019 0913   LEUKOCYTESUR NEGATIVE 09/11/2019 0913    Lab Results  Component Value Date   BACTERIA RARE (A) 09/11/2019    Pertinent Imaging:  No results found for  this or any previous visit. No results found for this or any previous visit. No results found for this or any previous visit. No results found for this or any previous visit. No results found for this or any previous visit. No results found for this or any previous visit. No results found for this or any previous visit. No results found for this or any previous visit.  Assessment & Plan:    1. Urinary retention -continue flomax 0.4mg  daily. RTC 6 months with PVR - Bladder Scan (Post Void Residual) in office   Return in about 6 months (around 04/24/2020) for PVR.  Nicolette Bang, MD  [facility] [place]

## 2019-10-24 NOTE — Progress Notes (Signed)

## 2019-10-24 NOTE — Patient Instructions (Signed)

## 2019-10-25 ENCOUNTER — Other Ambulatory Visit: Payer: Self-pay | Admitting: Cardiology

## 2019-10-29 ENCOUNTER — Other Ambulatory Visit: Payer: Self-pay

## 2019-10-29 ENCOUNTER — Encounter: Payer: Self-pay | Admitting: Nurse Practitioner

## 2019-10-29 ENCOUNTER — Ambulatory Visit (HOSPITAL_COMMUNITY)
Admission: RE | Admit: 2019-10-29 | Discharge: 2019-10-29 | Disposition: A | Discharge status: Home / Self Care | Payer: Medicare Other | Source: Ambulatory Visit | Attending: Nurse Practitioner | Admitting: Nurse Practitioner

## 2019-10-29 ENCOUNTER — Ambulatory Visit (INDEPENDENT_AMBULATORY_CARE_PROVIDER_SITE_OTHER): Payer: Medicare Other | Admitting: Nurse Practitioner

## 2019-10-29 ENCOUNTER — Other Ambulatory Visit (HOSPITAL_COMMUNITY): Payer: Medicare Other

## 2019-10-29 VITALS — BP 110/68 | HR 68 | Temp 97.9°F | Resp 18 | Ht 66.0 in | Wt 171.0 lb

## 2019-10-29 DIAGNOSIS — E782 Mixed hyperlipidemia: Secondary | ICD-10-CM

## 2019-10-29 DIAGNOSIS — I77810 Thoracic aortic ectasia: Secondary | ICD-10-CM | POA: Insufficient documentation

## 2019-10-29 DIAGNOSIS — M545 Low back pain, unspecified: Secondary | ICD-10-CM

## 2019-10-29 DIAGNOSIS — M25552 Pain in left hip: Secondary | ICD-10-CM

## 2019-10-29 DIAGNOSIS — S299XXA Unspecified injury of thorax, initial encounter: Secondary | ICD-10-CM | POA: Diagnosis not present

## 2019-10-29 DIAGNOSIS — R399 Unspecified symptoms and signs involving the genitourinary system: Secondary | ICD-10-CM | POA: Diagnosis not present

## 2019-10-29 DIAGNOSIS — C411 Malignant neoplasm of mandible: Secondary | ICD-10-CM | POA: Insufficient documentation

## 2019-10-29 DIAGNOSIS — Z9181 History of falling: Secondary | ICD-10-CM | POA: Insufficient documentation

## 2019-10-29 DIAGNOSIS — I10 Essential (primary) hypertension: Secondary | ICD-10-CM | POA: Diagnosis not present

## 2019-10-29 DIAGNOSIS — D696 Thrombocytopenia, unspecified: Secondary | ICD-10-CM | POA: Insufficient documentation

## 2019-10-29 LAB — URINALYSIS, ROUTINE W REFLEX MICROSCOPIC
Bacteria, UA: NONE SEEN /HPF
Bilirubin Urine: NEGATIVE
Glucose, UA: NEGATIVE
Hyaline Cast: NONE SEEN /LPF
Ketones, ur: NEGATIVE
Leukocytes,Ua: NEGATIVE
Nitrite: NEGATIVE
Protein, ur: NEGATIVE
Specific Gravity, Urine: 1.02 (ref 1.001–1.03)
Squamous Epithelial / HPF: NONE SEEN /HPF (ref <=5)
WBC, UA: NONE SEEN /HPF (ref 0–5)
pH: 6 (ref 5.0–8.0)

## 2019-10-29 LAB — CBC WITH DIFFERENTIAL/PLATELET
Absolute Monocytes: 262 cells/uL (ref 200–950)
Basophils Absolute: 9 cells/uL (ref 0–200)
Basophils Relative: 0.2 %
Eosinophils Absolute: 101 cells/uL (ref 15–500)
Eosinophils Relative: 2.2 %
HCT: 38.7 % (ref 38.5–50.0)
Hemoglobin: 12.9 g/dL — ABNORMAL LOW (ref 13.2–17.1)
Lymphs Abs: 1219 cells/uL (ref 850–3900)
MCH: 31.9 pg (ref 27.0–33.0)
MCHC: 33.3 g/dL (ref 32.0–36.0)
MCV: 95.8 fL (ref 80.0–100.0)
MPV: 11.4 fL (ref 7.5–12.5)
Monocytes Relative: 5.7 %
Neutro Abs: 3008 cells/uL (ref 1500–7800)
Neutrophils Relative %: 65.4 %
Platelets: 86 10*3/uL — ABNORMAL LOW (ref 140–400)
RBC: 4.04 10*6/uL — ABNORMAL LOW (ref 4.20–5.80)
RDW: 14.1 % (ref 11.0–15.0)
Total Lymphocyte: 26.5 %
WBC: 4.6 10*3/uL (ref 3.8–10.8)

## 2019-10-29 LAB — LIPID PANEL
Cholesterol: 170 mg/dL (ref <200)
HDL: 44 mg/dL (ref >=40)
LDL Cholesterol (Calc): 105 mg/dL (calc) — ABNORMAL HIGH
Non-HDL Cholesterol (Calc): 126 mg/dL (calc) (ref <130)
Total CHOL/HDL Ratio: 3.9 (calc) (ref <5.0)
Triglycerides: 111 mg/dL (ref <150)

## 2019-10-29 LAB — COMPLETE METABOLIC PANEL WITH GFR
AG Ratio: 1.7 (calc) (ref 1.0–2.5)
ALT: 13 U/L (ref 9–46)
AST: 22 U/L (ref 10–35)
Albumin: 3.8 g/dL (ref 3.6–5.1)
Alkaline phosphatase (APISO): 83 U/L (ref 35–144)
BUN: 16 mg/dL (ref 7–25)
CO2: 33 mmol/L — ABNORMAL HIGH (ref 20–32)
Calcium: 9.5 mg/dL (ref 8.6–10.3)
Chloride: 103 mmol/L (ref 98–110)
Creat: 1.05 mg/dL (ref 0.70–1.11)
GFR, Est African American: 74 mL/min/{1.73_m2} (ref >=60)
GFR, Est Non African American: 64 mL/min/{1.73_m2} (ref >=60)
Globulin: 2.2 g/dL (calc) (ref 1.9–3.7)
Glucose, Bld: 86 mg/dL (ref 65–99)
Potassium: 3.3 mmol/L — ABNORMAL LOW (ref 3.5–5.3)
Sodium: 144 mmol/L (ref 135–146)
Total Bilirubin: 2 mg/dL — ABNORMAL HIGH (ref 0.2–1.2)
Total Protein: 6 g/dL — ABNORMAL LOW (ref 6.1–8.1)

## 2019-10-29 LAB — MICROSCOPIC MESSAGE

## 2019-10-29 NOTE — Progress Notes (Signed)
New Patient Office Visit  Subjective:  Patient ID: John [name], male    DOB: [birth year]  Age: 86 y.o. MRN: [id number]  CC:  Chief Complaint  Patient presents with  . Establish Care    NP    HPI John [name] is a 86 year old presenting to establish care. He did see as PCP, Dr Luan Pulling, he has cardiologist recent labs, and urologist all copied and pasted below. He would like to delay labs, record transfer to evaluate lab needs. He is reporting a fall at hospital that he landed on bed 09/15/2019 pain continues not worse not better to lumbar back and left side lower portion of ribs. H/o BPH and urinary catheter during hospital course. will also check U/A today.    McKenzie, Candee Furbish, MD Urology: BPH monitoring  Ahmed Prima Fransisco Hertz, [facility] Review of cards labs in 10/09/2019 K was 3, pt is taking replacement that was ordered by ER.   Past Medical History:  Diagnosis Date  . Acute ST elevation myocardial infarction (STEMI) of posterior wall ([place]) 11/11/2015  . CKD (chronic kidney disease), stage III   . Coronary atherosclerosis of native coronary artery    a. Multivessel status post CABG in 1992. b. STEMI 11/2015 s/p DES to insertion site of LIMA-LAD.  [name] Dilated aortic root ([place])   . Essential hypertension   . Ischemic cardiomyopathy   . Malignant melanoma of skin   . Mixed hyperlipidemia   . Nephrolithiasis   . OSA (obstructive sleep apnea)   . Rosacea   . Thrombocytopenia ([place])     Past Surgical History:  Procedure Laterality Date  . CARDIAC CATHETERIZATION  07/11/90  . CARDIAC CATHETERIZATION N/A 11/11/2015   Procedure: Left Heart Cath and Coronary Angiography;  Surgeon: [name], MD;  Location: [facility];  Service: Cardiovascular;  Laterality: N/A;  . CARDIAC CATHETERIZATION N/A 11/14/2015   Procedure: Coronary Stent Intervention w/Impella;  Surgeon: [name], MD;  Location: [facility];  Service: Cardiovascular;  Laterality: N/A;  .  carotid duplex  06/24/2011, 05/12/2009   Right and Left ICAs: Demonstrates a small amount of irregular mixed density plaque with no evidence of diameter reduction, significant tortuosity or any other vascular abnormality. This is a mildly abnormal carotid duplex doppler evaluation.  [name] CATARACT EXTRACTION W/PHACO Right 02/11/2014   Procedure: CATARACT EXTRACTION PHACO AND INTRAOCULAR LENS PLACEMENT (IOC);  Surgeon: [name], MD;  Location: [facility];  Service: Ophthalmology;  Laterality: Right;  CDE 13.83  . CHOLECYSTECTOMY    . CORONARY ARTERY BYPASS GRAFT  1992  . DOPPLER ECHOCARDIOGRAPHY  05/23/2007   Left ventricular systolic function is normal. The transmitral spectral doppler flow pattern is suggestive of impaired LV relaxation. No significant valvular abnormalities.  . heart monitor  04/01/2009   palpitations  . HERNIA REPAIR    . MELANOMA EXCISION     neck  . NM MYOCAR PERF EJECTION FRACTION  02/10/2011   The post stress myocaardial perfusion images show a normal pattern of perfusion in all regions. No significant wall motion abnormalities noted. EKG is negative for ischemia. This is a low risk scan.  [name] TOTAL KNEE ARTHROPLASTY      Family History  Problem Relation Age of Onset  . Heart attack Father   . Skin cancer Brother   . Heart attack Brother     Social History   Socioeconomic History  . Marital status: Widowed    Spouse name: Not on file  .  Number of children: Not on file  . Years of education: Not on file  . Highest education level: Not on file  Occupational History  . Not on file  Tobacco Use  . Smoking status: Former Smoker    Types: Cigarettes    Start date: 07/06/1943    Quit date: 07/05/1981    Years since quitting: 38.3  . Smokeless tobacco: Never Used  Substance and Sexual Activity  . Alcohol use: No    Alcohol/week: 0.0 standard drinks  . Drug use: No  . Sexual activity: Yes    Partners: Female  Other Topics Concern  . Not on file  Social History  Narrative  . Not on file   Social Determinants of Health   Financial Resource Strain:   . Difficulty of Paying Living Expenses:   Food Insecurity:   . Worried About Charity fundraiser in the Last Year:   . Arboriculturist in the Last Year:   Transportation Needs:   . Film/video editor (Medical):   [name] Lack of Transportation (Non-Medical):   Physical Activity:   . Days of Exercise per Week:   . Minutes of Exercise per Session:   Stress:   . Feeling of Stress :   Social Connections:   . Frequency of Communication with Friends and Family:   . Frequency of Social Gatherings with Friends and Family:   . Attends Religious Services:   . Active Member of Clubs or Organizations:   . Attends Archivist Meetings:   [name] Marital Status:   Intimate Partner Violence:   . Fear of Current or Ex-Partner:   . Emotionally Abused:   [name] Physically Abused:   . Sexually Abused:     ROS Review of Systems  All other systems reviewed and are negative.   Objective:   Today's Vitals: BP 110/68 (BP Location: Left Arm, Patient Position: Sitting, Cuff Size: Normal)   Pulse 68   Temp 97.9 F (36.6 C) (Temporal)   Resp 18   Ht 5\' 6"  (1.676 m)   Wt 171 lb (77.6 kg)   SpO2 98%   BMI 27.60 kg/m   Physical Exam Vitals and nursing note reviewed.  Constitutional:      Appearance: Normal appearance. He is well-developed and well-groomed.  HENT:     Head: Normocephalic.     Right Ear: External ear normal. No decreased hearing noted.     Left Ear: External ear normal. No decreased hearing noted.     Nose: Nose normal.     Mouth/Throat:     Lips: Pink.  Eyes:     General: Lids are normal. Lids are everted, no foreign bodies appreciated.     Pupils: Pupils are equal, round, and reactive to light.  Neck:     Thyroid: No thyromegaly.     Vascular: No carotid bruit or JVD.  Cardiovascular:     Rate and Rhythm: Normal rate and regular rhythm.     Pulses: Normal pulses.     Heart  sounds: Normal heart sounds.  Pulmonary:     Effort: Pulmonary effort is normal.     Breath sounds: Normal breath sounds.  Chest:     Chest wall: No deformity or tenderness.  Abdominal:     General: Bowel sounds are normal. There is no abdominal bruit.     Palpations: There is no hepatomegaly or splenomegaly.  Musculoskeletal:     Cervical back: Normal, normal range of motion and neck  supple.     Thoracic back: Normal.     Lumbar back: Bony tenderness present. No swelling, edema or deformity. Decreased range of motion.     Right lower leg: No deformity. No edema.     Left lower leg: No deformity. No edema.  Lymphadenopathy:     Cervical: No cervical adenopathy.  Skin:    General: Skin is warm and dry.     Capillary Refill: Capillary refill takes less than 2 seconds.     Comments: Frail thin elderly skin, multiple bruising bilateral UE  Neurological:     General: No focal deficit present.     Mental Status: He is alert and oriented to person, place, and time.  Psychiatric:        Attention and Perception: Attention normal.        Mood and Affect: Mood normal.        Speech: Speech normal.        Behavior: Behavior normal. Behavior is cooperative.        Cognition and Memory: Cognition normal.        Judgment: Judgment normal.     Assessment & Plan:   Problem List Items Addressed This Visit      Cardiovascular and Mediastinum   Essential hypertension, benign - Primary   Relevant Orders   CBC with Differential/Platelet   COMPLETE METABOLIC PANEL WITH GFR     Genitourinary   Benign prostatic hyperplasia with urinary obstruction     Other   Mixed hyperlipidemia   Relevant Orders   Lipid panel    Other Visit Diagnoses    UTI symptoms       Relevant Orders   Urinalysis, Routine w reflex microscopic (Completed)   History of recent fall       Relevant Orders   DG Ribs Unilateral Left   DG Arthro Hip Left   DG Lumbar Spine Complete   Left hip pain       Relevant  Orders   DG Arthro Hip Left   Left-sided low back pain without sciatica, unspecified chronicity       Relevant Orders   DG Ribs Unilateral Left   DG Lumbar Spine Complete      Outpatient Encounter Medications as of 10/29/2019  Medication Sig  . aspirin EC 81 MG tablet Take 81 mg by mouth daily.  . clopidogrel (PLAVIX) 75 MG tablet Take 1 tablet by mouth once daily with breakfast  . clotrimazole-betamethasone (LOTRISONE) cream Apply 1 application topically 2 (two) times daily.   . fludrocortisone (FLORINEF) 0.1 MG tablet Take 100 mcg by mouth daily.  . furosemide (LASIX) 20 MG tablet Take 1 tablet (20 mg total) by mouth daily.  [name] ibuprofen (ADVIL) 400 MG tablet Take 400 mg by mouth every 6 (six) hours as needed.  . metoprolol succinate (TOPROL-XL) 25 MG 24 hr tablet Take 1 tablet by mouth twice daily  . neomycin-polymyxin b-dexamethasone (MAXITROL) 3.5-10000-0.1 OINT Place 1 application into the left eye daily.   . nitroGLYCERIN (NITROSTAT) 0.4 MG SL tablet Place 1 tablet (0.4 mg total) under the tongue every 5 (five) minutes x 3 doses as needed for chest pain.  . pantoprazole (PROTONIX) 40 MG tablet Take 40 mg by mouth daily.  . potassium chloride SA (KLOR-CON) 20 MEQ tablet Take 2 tablets (40 mEq total) by mouth daily.  . sacubitril-valsartan (ENTRESTO) 24-26 MG Take 1 tablet by mouth 2 (two) times daily.  . tamsulosin (FLOMAX) 0.4 MG CAPS capsule Take 1  capsule (0.4 mg total) by mouth daily.   No facility-administered encounter medications on file as of 10/29/2019.    Follow-up: Return in about 3 months (around 01/28/2020).   Annie Main, FNP

## 2019-10-30 ENCOUNTER — Other Ambulatory Visit: Payer: Self-pay | Admitting: Nurse Practitioner

## 2019-10-30 DIAGNOSIS — D693 Immune thrombocytopenic purpura: Secondary | ICD-10-CM

## 2019-10-30 NOTE — Progress Notes (Signed)
Stable labs compared to previous historical

## 2019-10-30 NOTE — Progress Notes (Signed)
Let the pt know results. Not acute. Rest, heat, ice, may take otc for pain medication. Sorry he is experiencing this discomfort. Directions as per office visit. Follow up as needed.  FINDINGS: There are 5 non-rib-bearing lumbar vertebra. Broad-based levo scoliotic curvature of the lumbar spine. Severe T12 compression deformity with greater than 75% loss of height anteriorly. This is age indeterminate. No evidence of lumbar compression fracture. Diffuse degenerative disc disease with disc space narrowing and endplate spurring. Facet hypertrophy is most prominent in the lower lumbar spine. Bones appear under mineralized. Aortic atherosclerosis.  IMPRESSION: 1. Severe T12 compression fracture with greater than 75% loss of height anteriorly. This is age indeterminate. 2. Mild scoliosis with multilevel degenerative disc disease and facet hypertrophy.

## 2019-11-01 ENCOUNTER — Ambulatory Visit: Payer: Medicare Other | Admitting: Family Medicine

## 2019-11-05 ENCOUNTER — Other Ambulatory Visit: Payer: Self-pay

## 2019-11-05 ENCOUNTER — Ambulatory Visit (HOSPITAL_COMMUNITY)
Admission: RE | Admit: 2019-11-05 | Discharge: 2019-11-05 | Disposition: A | Discharge status: Home / Self Care | Payer: Medicare Other | Source: Ambulatory Visit | Attending: Nurse Practitioner | Admitting: Nurse Practitioner

## 2019-11-14 ENCOUNTER — Telehealth: Payer: Self-pay

## 2019-11-14 DIAGNOSIS — M545 Low back pain: Secondary | ICD-10-CM | POA: Diagnosis not present

## 2019-11-14 DIAGNOSIS — M25552 Pain in left hip: Secondary | ICD-10-CM | POA: Diagnosis not present

## 2019-11-14 NOTE — Telephone Encounter (Signed)
  Call patient's daughter.  Reviewed in chart on behalf of [place] our nurse practitioner.   It seems like ultrasound was done because of his history of mildly elevated bilirubin though I think that this is just a genetic syndrome called Gilbert's (it is a benign elevation).  I do not see where he was having abdominal pain.  The ultrasound showed where he has had gallbladder removed  but he does have a dilated common bile duct which can occur after removal but there is no sign of a stone and there is no sign of infection.    If for some reason he is having abdominal pain there could be a tiny stone that we cannot see so he would have to be evaluated by gastroenterology it would need a more invasive procedure to go retrieve the stone.  If He does not have any abdominal pain or other symptoms,I would not put him through this procedure   If having pain- refer to GI, Dx RUQ pain, common bile duct dilatation

## 2019-11-14 NOTE — Telephone Encounter (Signed)
Spoke with pt's daughter and she stated that pt is not have abd pain but is having pain in the hip. She also states that he has an ortho appt today and she will get them to evaluate, but for now, no GI ref needed.

## 2019-11-14 NOTE — Telephone Encounter (Signed)
Pt's daughter is calling about pt's [place] results. The results have been in since 05/03 but have not been resulted. Can you result them so I can let family know. Please advise.

## 2019-11-19 ENCOUNTER — Ambulatory Visit: Payer: Medicare Other | Admitting: Family Medicine

## 2019-11-27 ENCOUNTER — Ambulatory Visit (INDEPENDENT_AMBULATORY_CARE_PROVIDER_SITE_OTHER): Payer: Medicare Other | Admitting: Nurse Practitioner

## 2019-11-27 ENCOUNTER — Other Ambulatory Visit: Payer: Self-pay

## 2019-11-27 VITALS — BP 110/66 | HR 73 | Temp 97.6°F | Resp 18 | Wt 171.0 lb

## 2019-11-27 DIAGNOSIS — N390 Urinary tract infection, site not specified: Secondary | ICD-10-CM

## 2019-11-27 DIAGNOSIS — M549 Dorsalgia, unspecified: Secondary | ICD-10-CM | POA: Diagnosis not present

## 2019-11-27 DIAGNOSIS — R109 Unspecified abdominal pain: Secondary | ICD-10-CM

## 2019-11-27 DIAGNOSIS — G8929 Other chronic pain: Secondary | ICD-10-CM | POA: Diagnosis not present

## 2019-11-27 LAB — URINALYSIS, ROUTINE W REFLEX MICROSCOPIC
Bacteria, UA: NONE SEEN /HPF
Bilirubin Urine: NEGATIVE
Glucose, UA: NEGATIVE
Hgb urine dipstick: NEGATIVE
Hyaline Cast: NONE SEEN /LPF
Ketones, ur: NEGATIVE
Nitrite: NEGATIVE
Protein, ur: NEGATIVE
Specific Gravity, Urine: 1.02 (ref 1.001–1.03)
Squamous Epithelial / HPF: NONE SEEN /HPF (ref <=5)
pH: 5.5 (ref 5.0–8.0)

## 2019-11-27 LAB — MICROSCOPIC MESSAGE

## 2019-11-27 MED ORDER — SULFAMETHOXAZOLE-TRIMETHOPRIM 800-160 MG PO TABS: 1.0000 | ORAL_TABLET | Freq: Two times a day (BID) | ORAL | 3 refills | Status: DC

## 2019-11-27 MED ORDER — CYCLOBENZAPRINE HCL 5 MG PO TABS: 5.0000 mg | ORAL_TABLET | Freq: Two times a day (BID) | ORAL | 1 refills | Status: DC | PRN

## 2019-11-27 NOTE — Patient Instructions (Signed)
Examination was negative for CVA pain or tenderness.  Examination is most consistent with chronic musculoskeletal back pain.  You do have a start of a urinary tract infection which I will cover with Bactrim for 3 days related to advanced age. Discussed with cardiology tomorrow and stopping Plavix. For your chronic pain I will prescribe Flexeril 5 mg to be taken twice a day as needed conservatively for pain recommend that caregiver/family monitor review at home for the first week for effects of medication as you are a high fall risk, and lives alone. Keep your life alert and Gilford Rile with you at all times You may continue to take ibuprofen 200 mg every 8 hours not to be combined with other pain medications once your Plavix has been discontinued, you may also take Tylenol 500 mg not to exceed 3000 mg in a 24-hour time. Follow-up as needed. Continue to use heat and ice.

## 2019-11-27 NOTE — Progress Notes (Signed)
Established Patient Office Visit  Subjective:  Patient ID: John [name], male    DOB: [birth year]  Age: 86 y.o. MRN: [id number]  CC:  Chief Complaint  Patient presents with  . Flank Pain    L, deep breaths causing pain, no injury, getting worst, edema, advil and tylenol was taken    HPI John [name] is a 86 year old male presenting to the clinic for back pain.  He has chronic back pain.  He is accompanied by parents caregiver who does not live with him as the patient lives alone however she does does come to his visits since he has been established.  The patient has been on Plavix for the past 2 years and he does see his cardiology tomorrow.  The patient takes ibuprofen 200 mg and reportedly taking 500 mg Tylenol 2 and 3 tablets at a time multiple times a day for his back pain.  He is using Voltaren ointment heating pad and ice.  Nothing seems to aggravate the pain.  He denies pain or burning when urinating urinary urgency increased from his normal with a history of enlarged prostate currently managed with Flomax, he is also on Lasix related to his cardiac history.  Patient denies chest pain chest tightness, other gu/gi symptoms, other pain, shortness of breath, recent injury or falls.  No fever or chills.  Past Medical History:  Diagnosis Date  . Acute ST elevation myocardial infarction (STEMI) of posterior wall ([place]) 11/11/2015  . CKD (chronic kidney disease), stage III   . Coronary atherosclerosis of native coronary artery    a. Multivessel status post CABG in 1992. b. STEMI 11/2015 s/p DES to insertion site of LIMA-LAD.  [name] Dilated aortic root ([place])   . Essential hypertension   . Ischemic cardiomyopathy   . Malignant melanoma of skin   . Mixed hyperlipidemia   . Nephrolithiasis   . OSA (obstructive sleep apnea)   . Rosacea   . Thrombocytopenia ([place])     Past Surgical History:  Procedure Laterality Date  . CARDIAC CATHETERIZATION  07/11/90  . CARDIAC CATHETERIZATION N/A  11/11/2015   Procedure: Left Heart Cath and Coronary Angiography;  Surgeon: [name], MD;  Location: [place] [facility];  Service: Cardiovascular;  Laterality: N/A;  . CARDIAC CATHETERIZATION N/A 11/14/2015   Procedure: Coronary Stent Intervention w/Impella;  Surgeon: [name], MD;  Location: [facility];  Service: Cardiovascular;  Laterality: N/A;  . carotid duplex  06/24/2011, 05/12/2009   Right and Left ICAs: Demonstrates a small amount of irregular mixed density plaque with no evidence of diameter reduction, significant tortuosity or any other vascular abnormality. This is a mildly abnormal carotid duplex doppler evaluation.  [name] CATARACT EXTRACTION W/PHACO Right 02/11/2014   Procedure: CATARACT EXTRACTION PHACO AND INTRAOCULAR LENS PLACEMENT (IOC);  Surgeon: [name], MD;  Location: [facility];  Service: Ophthalmology;  Laterality: Right;  CDE 13.83  . CHOLECYSTECTOMY    . CORONARY ARTERY BYPASS GRAFT  1992  . DOPPLER ECHOCARDIOGRAPHY  05/23/2007   Left ventricular systolic function is normal. The transmitral spectral doppler flow pattern is suggestive of impaired LV relaxation. No significant valvular abnormalities.  . heart monitor  04/01/2009   palpitations  . HERNIA REPAIR    . MELANOMA EXCISION     neck  . NM MYOCAR PERF EJECTION FRACTION  02/10/2011   The post stress myocaardial perfusion images show a normal pattern of perfusion in all regions. No significant wall motion abnormalities noted. EKG is negative  for ischemia. This is a low risk scan.  [name] TOTAL KNEE ARTHROPLASTY      Family History  Problem Relation Age of Onset  . Heart attack Father   . Skin cancer Brother   . Heart attack Brother     Social History   Socioeconomic History  . Marital status: Widowed    Spouse name: Not on file  . Number of children: Not on file  . Years of education: Not on file  . Highest education level: Not on file  Occupational History  . Not on file  Tobacco Use  . Smoking  status: Former Smoker    Types: Cigarettes    Start date: 07/06/1943    Quit date: 07/05/1981    Years since quitting: 38.4  . Smokeless tobacco: Never Used  Substance and Sexual Activity  . Alcohol use: No    Alcohol/week: 0.0 standard drinks  . Drug use: No  . Sexual activity: Yes    Partners: Female  Other Topics Concern  . Not on file  Social History Narrative  . Not on file   Social Determinants of Health   Financial Resource Strain:   . Difficulty of Paying Living Expenses:   Food Insecurity:   . Worried About Charity fundraiser in the Last Year:   . Arboriculturist in the Last Year:   Transportation Needs:   . Film/video editor (Medical):   [name] Lack of Transportation (Non-Medical):   Physical Activity:   . Days of Exercise per Week:   . Minutes of Exercise per Session:   Stress:   . Feeling of Stress :   Social Connections:   . Frequency of Communication with Friends and Family:   . Frequency of Social Gatherings with Friends and Family:   . Attends Religious Services:   . Active Member of Clubs or Organizations:   . Attends Archivist Meetings:   [name] Marital Status:   Intimate Partner Violence:   . Fear of Current or Ex-Partner:   . Emotionally Abused:   [name] Physically Abused:   . Sexually Abused:     Outpatient Medications Prior to Visit  Medication Sig Dispense Refill  . aspirin EC 81 MG tablet Take 81 mg by mouth daily.    . clopidogrel (PLAVIX) 75 MG tablet Take 1 tablet by mouth once daily with breakfast 90 tablet 3  . clotrimazole-betamethasone (LOTRISONE) cream Apply 1 application topically 2 (two) times daily.     . fludrocortisone (FLORINEF) 0.1 MG tablet Take 100 mcg by mouth daily.    . furosemide (LASIX) 20 MG tablet Take 1 tablet (20 mg total) by mouth daily. 90 tablet 3  . ibuprofen (ADVIL) 400 MG tablet Take 400 mg by mouth every 6 (six) hours as needed.    . metoprolol succinate (TOPROL-XL) 25 MG 24 hr tablet Take 1 tablet by mouth  twice daily 180 tablet 1  . neomycin-polymyxin b-dexamethasone (MAXITROL) 3.5-10000-0.1 OINT Place 1 application into the left eye daily.     . nitroGLYCERIN (NITROSTAT) 0.4 MG SL tablet Place 1 tablet (0.4 mg total) under the tongue every 5 (five) minutes x 3 doses as needed for chest pain. 25 tablet 2  . pantoprazole (PROTONIX) 40 MG tablet Take 40 mg by mouth daily.    . potassium chloride SA (KLOR-CON) 20 MEQ tablet Take 2 tablets (40 mEq total) by mouth daily. 60 tablet 3  . sacubitril-valsartan (ENTRESTO) 24-26 MG Take 1 tablet by mouth  2 (two) times daily. 60 tablet 6  . tamsulosin (FLOMAX) 0.4 MG CAPS capsule Take 1 capsule (0.4 mg total) by mouth daily. 90 capsule 3   No facility-administered medications prior to visit.    Allergies  Allergen Reactions  . Statins Other (See Comments)    Weak,nervous  . Xanax [Alprazolam] Other (See Comments)    Hallucination  . Tape Rash    ROS Review of Systems  All other systems reviewed and are negative.     Objective:    Physical Exam  Constitutional: He is oriented to person, place, and time. Vital signs are normal. He appears well-developed and well-nourished. He is cooperative.  Non-toxic appearance. He does not have a sickly appearance. He does not appear ill.  HENT:  Head: Normocephalic.  Right Ear: Hearing and ear canal normal.  Left Ear: Hearing and ear canal normal.  Eyes: Pupils are equal, round, and reactive to light. Conjunctivae, EOM and lids are normal. Lids are everted and swept, no foreign bodies found.  Neck: No JVD present. Carotid bruit is not present. No thyromegaly present.  Cardiovascular: Normal rate.  Pulmonary/Chest: Effort normal and breath sounds normal.  Abdominal: Soft. Normal appearance. He exhibits no shifting dullness, no distension, no pulsatile liver, no fluid wave, no abdominal bruit, no ascites, no pulsatile midline mass and no mass. There is no hepatosplenomegaly. There is no abdominal tenderness.  There is no rigidity, no rebound, no guarding, no CVA tenderness, no tenderness at McBurney's point and negative Murphy's sign.  Musculoskeletal:     Cervical back: Normal range of motion and neck supple. Deformity and bony tenderness present. No swelling or edema.     Thoracic back: Deformity and bony tenderness present. No swelling.     Lumbar back: Bony tenderness present. No swelling, edema, deformity, pain or spasms.     Comments: Musculoskeletal changes consistent with aging  Lymphadenopathy:    He has no cervical adenopathy.  Neurological: He is alert and oriented to person, place, and time. Coordination abnormal.  Frail elderly ambulation with cane  Skin: Skin is warm and dry.  Psychiatric: He has a normal mood and affect. His speech is normal and behavior is normal. Judgment and thought content normal. Cognition and memory are normal.  Nursing note and vitals reviewed.   BP 110/66 (BP Location: Left Arm, Patient Position: Sitting, Cuff Size: Normal)   Pulse 73   Temp 97.6 F (36.4 C) (Temporal)   Resp 18   Wt 171 lb (77.6 kg)   SpO2 97%   BMI 27.60 kg/m  Wt Readings from Last 3 Encounters:  11/27/19 171 lb (77.6 kg)  10/29/19 171 lb (77.6 kg)  10/24/19 186 lb (84.4 kg)     Health Maintenance Due  Topic Date Due  . COVID-19 Vaccine (1) Never done    There are no preventive care reminders to display for this patient.  Lab Results  Component Value Date   TSH 1.337 09/11/2019   Lab Results  Component Value Date   WBC 4.6 10/29/2019   HGB 12.9 (L) 10/29/2019   HCT 38.7 10/29/2019   MCV 95.8 10/29/2019   PLT 86 (L) 10/29/2019   Lab Results  Component Value Date   NA 144 10/29/2019   K 3.3 (L) 10/29/2019   CHLORIDE 108 12/18/2015   CO2 33 (H) 10/29/2019   GLUCOSE 86 10/29/2019   BUN 16 10/29/2019   CREATININE 1.05 10/29/2019   BILITOT 2.0 (H) 10/29/2019   ALKPHOS 73 09/16/2019  AST 22 10/29/2019   ALT 13 10/29/2019   PROT 6.0 (L) 10/29/2019   ALBUMIN  2.9 (L) 09/16/2019   CALCIUM 9.5 10/29/2019   ANIONGAP 10 10/09/2019   EGFR 58 (L) 12/18/2015   Lab Results  Component Value Date   CHOL 170 10/29/2019   Lab Results  Component Value Date   HDL 44 10/29/2019   Lab Results  Component Value Date   LDLCALC 105 (H) 10/29/2019   Lab Results  Component Value Date   TRIG 111 10/29/2019   Lab Results  Component Value Date   CHOLHDL 3.9 10/29/2019   Lab Results  Component Value Date   HGBA1C 5.9 (H) 11/11/2015      Assessment & Plan:  Examination was negative for CVA pain or tenderness.  Examination is most consistent with chronic musculoskeletal back pain.  You do have a start of a urinary tract infection which I will cover with Bactrim for 3 days related to advanced age. Discussed with cardiology tomorrow and stopping Plavix. For your chronic pain I will prescribe Flexeril 5 mg to be taken twice a day as needed conservatively for pain recommend that caregiver/family monitor review at home for the first week for effects of medication as you are a high fall risk, and lives alone. Keep your life alert and Gilford Rile with you at all times You may continue to take ibuprofen 200 mg every 8 hours not to be combined with other pain medications once your Plavix has been discontinued, you may also take Tylenol 500 mg not to exceed 3000 mg in a 24-hour time. Follow-up as needed. Continue to use heat and ice. Problem List Items Addressed This Visit    None    Visit Diagnoses    Acute left flank pain    -  Primary   Relevant Medications   sulfamethoxazole-trimethoprim (BACTRIM DS) 800-160 MG tablet   Other Relevant Orders   Urinalysis, Routine w reflex microscopic (Completed)   Chronic back pain, unspecified back location, unspecified back pain laterality       Relevant Medications   cyclobenzaprine (FLEXERIL) 5 MG tablet   Urinary tract infection without hematuria, site unspecified       Relevant Medications    sulfamethoxazole-trimethoprim (BACTRIM DS) 800-160 MG tablet      Meds ordered this encounter  Medications  . cyclobenzaprine (FLEXERIL) 5 MG tablet    Sig: Take 1 tablet (5 mg total) by mouth 2 (two) times daily as needed for muscle spasms.    Dispense:  30 tablet    Refill:  1  . sulfamethoxazole-trimethoprim (BACTRIM DS) 800-160 MG tablet    Sig: Take 1 tablet by mouth 2 (two) times daily.    Dispense:  6 tablet    Refill:  3    Follow-up: Return if symptoms worsen or fail to improve.    Annie Main, FNP

## 2019-11-28 ENCOUNTER — Other Ambulatory Visit: Payer: Self-pay

## 2019-11-28 ENCOUNTER — Ambulatory Visit: Payer: Medicare Other | Admitting: Cardiology

## 2019-11-28 ENCOUNTER — Encounter: Payer: Self-pay | Admitting: Cardiology

## 2019-11-28 ENCOUNTER — Encounter: Payer: Self-pay | Admitting: Nurse Practitioner

## 2019-11-28 ENCOUNTER — Other Ambulatory Visit: Payer: Self-pay | Admitting: Nurse Practitioner

## 2019-11-28 VITALS — BP 158/88 | HR 76 | Ht 66.0 in | Wt 170.8 lb

## 2019-11-28 DIAGNOSIS — I25119 Atherosclerotic heart disease of native coronary artery with unspecified angina pectoris: Secondary | ICD-10-CM | POA: Diagnosis not present

## 2019-11-28 DIAGNOSIS — R931 Abnormal findings on diagnostic imaging of heart and coronary circulation: Secondary | ICD-10-CM | POA: Insufficient documentation

## 2019-11-28 DIAGNOSIS — I255 Ischemic cardiomyopathy: Secondary | ICD-10-CM

## 2019-11-28 HISTORY — DX: Abnormal findings on diagnostic imaging of heart and coronary circulation: R93.1

## 2019-11-28 MED ORDER — SPIRONOLACTONE 25 MG PO TABS
12.5000 mg | ORAL_TABLET | Freq: Every day | ORAL | 3 refills | Status: DC
Start: 2019-11-28 — End: 2020-07-24

## 2019-11-28 NOTE — Patient Instructions (Signed)
Medication Instructions:  START Aldactone 12.5 mg daily    STOP PLAVIX    *If you need a refill on your cardiac medications before your next appointment, please call your pharmacy*   Lab Work: BMET [place] next visit  If you have labs (blood work) drawn today and your tests are completely normal, you will receive your results only by: [name] MyChart Message (if you have MyChart) OR . A paper copy in the mail If you have any lab test that is abnormal or we need to change your treatment, we will call you to review the results.   Testing/Procedures: None today   Follow-Up: At [facility], you and your health needs are our priority.  As part of our continuing mission to provide you with exceptional heart care, we have created designated Provider Care Teams.  These Care Teams include your primary Cardiologist (physician) and Advanced Practice Providers (APPs -  Physician Assistants and Nurse Practitioners) who all work together to provide you with the care you need, when you need it.  We recommend signing up for the patient portal called "MyChart".  Sign up information is provided on this After Visit Summary.  MyChart is used to connect with patients for Virtual Visits (Telemedicine).  Patients are able to view lab/test results, encounter notes, upcoming appointments, etc.  Non-urgent messages can be sent to your provider as well.   To learn more about what you can do with MyChart, go to [email or web address].    Your next appointment:   3 month(s)  The format for your next appointment:   In Person  Provider:   Bernerd Pho, PA-C   Other Instructions None       Thank you for choosing [facility] !

## 2019-11-28 NOTE — Progress Notes (Signed)
Cardiology Office Note  Date: 11/28/2019   ID: John [name], DOB 12-Jan-1930, MRN [id number]  PCP:  Annie Main, FNP  Cardiologist:  [name], MD Electrophysiologist:  None   Chief Complaint  Patient presents with  . Cardiac follow-up    History of Present Illness: John [name] is an 86 y.o. male last seen in March by Ms. Strader PA-C.  He is here today with a family member.  I reviewed the chart, he is now following with a new primary care provider.  He does not report any active angina at this time, dyspnea on exertion has been relatively stable and generally NYHA class II with typical activities.  He has had no palpitations or syncope.  Main complaint is of progressive back pain.  Record indicate hospitalization in March with COVID-19 pneumonia.  I reviewed his current medications which are outlined below.  We discussed stopping Plavix at this point.  In terms of his heart failure regimen we will also start low-dose Aldactone, he is otherwise on Entresto, Toprol-XL, low-dose Lasix and potassium supplement.  I reviewed his recent lab work from April.  Past Medical History:  Diagnosis Date  . Acute ST elevation myocardial infarction (STEMI) of posterior wall ([place]) 11/11/2015  . CKD (chronic kidney disease), stage III   . Coronary atherosclerosis of native coronary artery    a. Multivessel status post CABG in 1992. b. STEMI 11/2015 s/p DES to insertion site of LIMA-LAD.  [name] Dilated aortic root ([place])   . Essential hypertension   . Ischemic cardiomyopathy   . Malignant melanoma of skin   . Mixed hyperlipidemia   . Nephrolithiasis   . OSA (obstructive sleep apnea)   . Pneumonia due to COVID-19 virus    March 2021  . Rosacea   . Thrombocytopenia ([place])     Past Surgical History:  Procedure Laterality Date  . CARDIAC CATHETERIZATION  07/11/90  . CARDIAC CATHETERIZATION N/A 11/11/2015   Procedure: Left Heart Cath and Coronary Angiography;  Surgeon: [name], MD;  Location: [facility];  Service: Cardiovascular;  Laterality: N/A;  . CARDIAC CATHETERIZATION N/A 11/14/2015   Procedure: Coronary Stent Intervention w/Impella;  Surgeon: [name], MD;  Location: [place];  Service: Cardiovascular;  Laterality: N/A;  . carotid duplex  06/24/2011, 05/12/2009   Right and Left ICAs: Demonstrates a small amount of irregular mixed density plaque with no evidence of diameter reduction, significant tortuosity or any other vascular abnormality. This is a mildly abnormal carotid duplex doppler evaluation.  [name] CATARACT EXTRACTION W/PHACO Right 02/11/2014   Procedure: CATARACT EXTRACTION PHACO AND INTRAOCULAR LENS PLACEMENT (IOC);  Surgeon: [name], MD;  Location: [facility];  Service: Ophthalmology;  Laterality: Right;  CDE 13.83  . CHOLECYSTECTOMY    . CORONARY ARTERY BYPASS GRAFT  1992  . DOPPLER ECHOCARDIOGRAPHY  05/23/2007   Left ventricular systolic function is normal. The transmitral spectral doppler flow pattern is suggestive of impaired LV relaxation. No significant valvular abnormalities.  . heart monitor  04/01/2009   palpitations  . HERNIA REPAIR    . MELANOMA EXCISION     neck  . NM MYOCAR PERF EJECTION FRACTION  02/10/2011   The post stress myocaardial perfusion images show a normal pattern of perfusion in all regions. No significant wall motion abnormalities noted. EKG is negative for ischemia. This is a low risk scan.  [name] TOTAL KNEE ARTHROPLASTY      Current Outpatient Medications  Medication Sig Dispense  Refill  . aspirin EC 81 MG tablet Take 81 mg by mouth daily.    . clopidogrel (PLAVIX) 75 MG tablet Take 1 tablet by mouth once daily with breakfast 90 tablet 3  . clotrimazole-betamethasone (LOTRISONE) cream Apply 1 application topically 2 (two) times daily.     . cyclobenzaprine (FLEXERIL) 5 MG tablet Take 1 tablet (5 mg total) by mouth 2 (two) times daily as needed for muscle spasms. 30 tablet 1  . fludrocortisone  (FLORINEF) 0.1 MG tablet Take 100 mcg by mouth daily.    . furosemide (LASIX) 20 MG tablet Take 1 tablet (20 mg total) by mouth daily. 90 tablet 3  . ibuprofen (ADVIL) 400 MG tablet Take 400 mg by mouth every 6 (six) hours as needed.    . metoprolol succinate (TOPROL-XL) 25 MG 24 hr tablet Take 1 tablet by mouth twice daily 180 tablet 1  . neomycin-polymyxin b-dexamethasone (MAXITROL) 3.5-10000-0.1 OINT Place 1 application into the left eye daily.     . nitroGLYCERIN (NITROSTAT) 0.4 MG SL tablet Place 1 tablet (0.4 mg total) under the tongue every 5 (five) minutes x 3 doses as needed for chest pain. 25 tablet 2  . pantoprazole (PROTONIX) 40 MG tablet Take 40 mg by mouth daily.    . potassium chloride SA (KLOR-CON) 20 MEQ tablet Take 2 tablets (40 mEq total) by mouth daily. 60 tablet 3  . sacubitril-valsartan (ENTRESTO) 24-26 MG Take 1 tablet by mouth 2 (two) times daily. 60 tablet 6  . sulfamethoxazole-trimethoprim (BACTRIM DS) 800-160 MG tablet Take 1 tablet by mouth 2 (two) times daily. 6 tablet 3  . tamsulosin (FLOMAX) 0.4 MG CAPS capsule Take 1 capsule (0.4 mg total) by mouth daily. 90 capsule 3  . spironolactone (ALDACTONE) 25 MG tablet Take 0.5 tablets (12.5 mg total) by mouth daily. 90 tablet 3   No current facility-administered medications for this visit.   Allergies:  Statins, Xanax [alprazolam], and Tape   ROS:   Chronic hearing loss.  Physical Exam: VS:  BP (!) 158/88   Pulse 76   Ht 5\' 6"  (1.676 m)   Wt 170 lb 12.8 oz (77.5 kg)   SpO2 96%   BMI 27.57 kg/m , BMI Body mass index is 27.57 kg/m.  Wt Readings from Last 3 Encounters:  11/28/19 170 lb 12.8 oz (77.5 kg)  11/27/19 171 lb (77.6 kg)  10/29/19 171 lb (77.6 kg)    General: Elderly male wearing oxygen via nasal cannula. HEENT: Conjunctiva and lids normal, wearing a mask. Neck: Supple, no elevated JVP or carotid bruits, no thyromegaly. Lungs: Clear to auscultation, nonlabored breathing at rest. Cardiac: Regular  rate and rhythm, no S3, 2/6 systolic murmur. Extremities: Mild lower leg edema, distal pulses 2+.  ECG:  An ECG dated 09/11/2019 was personally reviewed today and demonstrated:  Probable sinus rhythm with prolonged PR interval and IVCD.  Recent Labwork: 09/11/2019: B Natriuretic Peptide 1,832.0; TSH 1.337 09/16/2019: Magnesium 1.9 10/29/2019: ALT 13; AST 22; BUN 16; Creat 1.05; Hemoglobin 12.9; Platelets 86; Potassium 3.3; Sodium 144     Component Value Date/Time   CHOL 170 10/29/2019 1113   TRIG 111 10/29/2019 1113   HDL 44 10/29/2019 1113   CHOLHDL 3.9 10/29/2019 1113   VLDL 41 (H) 11/12/2015 0432   LDLCALC 105 (H) 10/29/2019 1113    Other Studies Reviewed Today:  Echocardiogram 06/26/2019: 1. Left ventricular ejection fraction, by visual estimation, is 25 to  30%. The left ventricle has moderate to severely decreased function.  There  is no left ventricular hypertrophy.  2. Severely dilated left ventricular internal cavity size.  3. The left ventricle demonstrates global hypokinesis.  4. Global right ventricle has normal systolic function.The right  ventricular size is normal. No increase in right ventricular wall  thickness.  5. Left atrial size was mildly dilated.  6. Right atrial size was normal.  7. The mitral valve is normal in structure. Mild mitral valve  regurgitation.  8. The tricuspid valve is normal in structure. Tricuspid valve  regurgitation is mild.  9. The aortic valve is tricuspid. Aortic valve regurgitation is mild.  10. Pulmonic regurgitation is mild.  11. The pulmonic valve was not well visualized. Pulmonic valve  regurgitation is mild.  12. The inferior vena cava is normal in size with greater than 50%  respiratory variability, suggesting right atrial pressure of 3 mmHg.   Assessment and Plan:  1.  Ischemic cardiomyopathy with LVEF 25 to 30% by most recent evaluation in December 2020.  Weight has been stable recently, no obvious problems with  progressive fluid overload.  Current regimen includes Toprol-XL, Entresto, low-dose Lasix, and potassium supplement.  We will add Aldactone 12.5 mg daily.  Recent lab work reviewed.  Follow-up BMET for next office visit.  2.  CAD status post CABG and DES intervention to the LIMA to LAD in 2017.  No progressive angina symptoms on medical therapy.  Continue aspirin and Toprol-XL.  Stopping Plavix at this point.  He also has a history of statin intolerance and has declined other options.  Medication Adjustments/Labs and Tests Ordered: Current medicines are reviewed at length with the patient today.  Concerns regarding medicines are outlined above.   Tests Ordered: Orders Placed This Encounter  Procedures  . Basic Metabolic Panel (BMET)    Medication Changes: Meds ordered this encounter  Medications  . spironolactone (ALDACTONE) 25 MG tablet    Sig: Take 0.5 tablets (12.5 mg total) by mouth daily.    Dispense:  90 tablet    Refill:  3    Disposition:  Follow up 3 months with [name] in the [place] office.  Signed, [name], MD, [facility] 11/28/2019 1:38 PM    [place] [facility] [address] [place], [place] [address] Phone: [phone]; Fax: [phone]

## 2019-12-03 ENCOUNTER — Emergency Department (HOSPITAL_COMMUNITY): Payer: Medicare Other

## 2019-12-03 ENCOUNTER — Encounter (HOSPITAL_COMMUNITY): Payer: Self-pay | Admitting: *Deleted

## 2019-12-03 ENCOUNTER — Other Ambulatory Visit: Payer: Self-pay

## 2019-12-03 ENCOUNTER — Inpatient Hospital Stay (HOSPITAL_COMMUNITY)
Admission: EM | Admit: 2019-12-03 | Discharge: 2019-12-13 | DRG: 551 | Disposition: A | Discharge status: Skilled Nursing Facility | Payer: Medicare Other | Attending: Family Medicine | Admitting: Family Medicine

## 2019-12-03 DIAGNOSIS — F0280 Dementia in other diseases classified elsewhere without behavioral disturbance: Secondary | ICD-10-CM | POA: Diagnosis present

## 2019-12-03 DIAGNOSIS — Z20822 Contact with and (suspected) exposure to covid-19: Secondary | ICD-10-CM | POA: Diagnosis present

## 2019-12-03 DIAGNOSIS — S22080A Wedge compression fracture of T11-T12 vertebra, initial encounter for closed fracture: Secondary | ICD-10-CM | POA: Diagnosis present

## 2019-12-03 DIAGNOSIS — I10 Essential (primary) hypertension: Secondary | ICD-10-CM | POA: Diagnosis present

## 2019-12-03 DIAGNOSIS — I2510 Atherosclerotic heart disease of native coronary artery without angina pectoris: Secondary | ICD-10-CM | POA: Diagnosis not present

## 2019-12-03 DIAGNOSIS — I255 Ischemic cardiomyopathy: Secondary | ICD-10-CM | POA: Diagnosis present

## 2019-12-03 DIAGNOSIS — Z87440 Personal history of urinary (tract) infections: Secondary | ICD-10-CM

## 2019-12-03 DIAGNOSIS — I130 Hypertensive heart and chronic kidney disease with heart failure and stage 1 through stage 4 chronic kidney disease, or unspecified chronic kidney disease: Secondary | ICD-10-CM | POA: Diagnosis present

## 2019-12-03 DIAGNOSIS — I5040 Unspecified combined systolic (congestive) and diastolic (congestive) heart failure: Secondary | ICD-10-CM | POA: Diagnosis not present

## 2019-12-03 DIAGNOSIS — Z8249 Family history of ischemic heart disease and other diseases of the circulatory system: Secondary | ICD-10-CM

## 2019-12-03 DIAGNOSIS — H9190 Unspecified hearing loss, unspecified ear: Secondary | ICD-10-CM | POA: Diagnosis present

## 2019-12-03 DIAGNOSIS — I5042 Chronic combined systolic (congestive) and diastolic (congestive) heart failure: Secondary | ICD-10-CM | POA: Diagnosis present

## 2019-12-03 DIAGNOSIS — M6281 Muscle weakness (generalized): Secondary | ICD-10-CM | POA: Diagnosis not present

## 2019-12-03 DIAGNOSIS — S0990XA Unspecified injury of head, initial encounter: Secondary | ICD-10-CM

## 2019-12-03 DIAGNOSIS — Z66 Do not resuscitate: Secondary | ICD-10-CM | POA: Diagnosis present

## 2019-12-03 DIAGNOSIS — N1832 Chronic kidney disease, stage 3b: Secondary | ICD-10-CM | POA: Diagnosis present

## 2019-12-03 DIAGNOSIS — R296 Repeated falls: Secondary | ICD-10-CM | POA: Diagnosis not present

## 2019-12-03 DIAGNOSIS — G8929 Other chronic pain: Secondary | ICD-10-CM | POA: Diagnosis not present

## 2019-12-03 DIAGNOSIS — Z955 Presence of coronary angioplasty implant and graft: Secondary | ICD-10-CM

## 2019-12-03 DIAGNOSIS — G92 Toxic encephalopathy: Secondary | ICD-10-CM | POA: Diagnosis present

## 2019-12-03 DIAGNOSIS — Z9109 Other allergy status, other than to drugs and biological substances: Secondary | ICD-10-CM

## 2019-12-03 DIAGNOSIS — I447 Left bundle-branch block, unspecified: Secondary | ICD-10-CM | POA: Diagnosis not present

## 2019-12-03 DIAGNOSIS — E876 Hypokalemia: Secondary | ICD-10-CM | POA: Diagnosis not present

## 2019-12-03 DIAGNOSIS — Z8701 Personal history of pneumonia (recurrent): Secondary | ICD-10-CM

## 2019-12-03 DIAGNOSIS — Z87891 Personal history of nicotine dependence: Secondary | ICD-10-CM

## 2019-12-03 DIAGNOSIS — R262 Difficulty in walking, not elsewhere classified: Secondary | ICD-10-CM | POA: Diagnosis not present

## 2019-12-03 DIAGNOSIS — Z9181 History of falling: Secondary | ICD-10-CM

## 2019-12-03 DIAGNOSIS — I252 Old myocardial infarction: Secondary | ICD-10-CM

## 2019-12-03 DIAGNOSIS — G3183 Dementia with Lewy bodies: Secondary | ICD-10-CM | POA: Diagnosis present

## 2019-12-03 DIAGNOSIS — R1312 Dysphagia, oropharyngeal phase: Secondary | ICD-10-CM | POA: Diagnosis not present

## 2019-12-03 DIAGNOSIS — Z7952 Long term (current) use of systemic steroids: Secondary | ICD-10-CM

## 2019-12-03 DIAGNOSIS — Z8616 Personal history of COVID-19: Secondary | ICD-10-CM | POA: Diagnosis not present

## 2019-12-03 DIAGNOSIS — L719 Rosacea, unspecified: Secondary | ICD-10-CM | POA: Diagnosis present

## 2019-12-03 DIAGNOSIS — Z7401 Bed confinement status: Secondary | ICD-10-CM | POA: Diagnosis not present

## 2019-12-03 DIAGNOSIS — R519 Headache, unspecified: Secondary | ICD-10-CM | POA: Diagnosis not present

## 2019-12-03 DIAGNOSIS — M2550 Pain in unspecified joint: Secondary | ICD-10-CM | POA: Diagnosis not present

## 2019-12-03 DIAGNOSIS — Y92009 Unspecified place in unspecified non-institutional (private) residence as the place of occurrence of the external cause: Secondary | ICD-10-CM | POA: Diagnosis not present

## 2019-12-03 DIAGNOSIS — E782 Mixed hyperlipidemia: Secondary | ICD-10-CM | POA: Diagnosis present

## 2019-12-03 DIAGNOSIS — R109 Unspecified abdominal pain: Secondary | ICD-10-CM | POA: Diagnosis not present

## 2019-12-03 DIAGNOSIS — Z87442 Personal history of urinary calculi: Secondary | ICD-10-CM

## 2019-12-03 DIAGNOSIS — Z961 Presence of intraocular lens: Secondary | ICD-10-CM | POA: Diagnosis present

## 2019-12-03 DIAGNOSIS — Z743 Need for continuous supervision: Secondary | ICD-10-CM | POA: Diagnosis not present

## 2019-12-03 DIAGNOSIS — R2681 Unsteadiness on feet: Secondary | ICD-10-CM | POA: Diagnosis not present

## 2019-12-03 DIAGNOSIS — D696 Thrombocytopenia, unspecified: Secondary | ICD-10-CM | POA: Diagnosis not present

## 2019-12-03 DIAGNOSIS — Z85820 Personal history of malignant melanoma of skin: Secondary | ICD-10-CM

## 2019-12-03 DIAGNOSIS — Z7982 Long term (current) use of aspirin: Secondary | ICD-10-CM

## 2019-12-03 DIAGNOSIS — M5410 Radiculopathy, site unspecified: Secondary | ICD-10-CM | POA: Diagnosis present

## 2019-12-03 DIAGNOSIS — T481X5A Adverse effect of skeletal muscle relaxants [neuromuscular blocking agents], initial encounter: Secondary | ICD-10-CM | POA: Diagnosis not present

## 2019-12-03 DIAGNOSIS — I517 Cardiomegaly: Secondary | ICD-10-CM | POA: Diagnosis not present

## 2019-12-03 DIAGNOSIS — Z951 Presence of aortocoronary bypass graft: Secondary | ICD-10-CM

## 2019-12-03 DIAGNOSIS — Z96659 Presence of unspecified artificial knee joint: Secondary | ICD-10-CM | POA: Diagnosis present

## 2019-12-03 DIAGNOSIS — S22089A Unspecified fracture of T11-T12 vertebra, initial encounter for closed fracture: Principal | ICD-10-CM | POA: Diagnosis present

## 2019-12-03 DIAGNOSIS — N183 Chronic kidney disease, stage 3 unspecified: Secondary | ICD-10-CM | POA: Diagnosis present

## 2019-12-03 DIAGNOSIS — Z9841 Cataract extraction status, right eye: Secondary | ICD-10-CM

## 2019-12-03 DIAGNOSIS — Z9049 Acquired absence of other specified parts of digestive tract: Secondary | ICD-10-CM

## 2019-12-03 DIAGNOSIS — T428X5A Adverse effect of antiparkinsonism drugs and other central muscle-tone depressants, initial encounter: Secondary | ICD-10-CM | POA: Diagnosis present

## 2019-12-03 DIAGNOSIS — M546 Pain in thoracic spine: Secondary | ICD-10-CM | POA: Diagnosis not present

## 2019-12-03 DIAGNOSIS — M4804 Spinal stenosis, thoracic region: Secondary | ICD-10-CM | POA: Diagnosis not present

## 2019-12-03 DIAGNOSIS — W19XXXA Unspecified fall, initial encounter: Secondary | ICD-10-CM | POA: Diagnosis present

## 2019-12-03 DIAGNOSIS — M545 Low back pain: Secondary | ICD-10-CM | POA: Diagnosis not present

## 2019-12-03 DIAGNOSIS — G9349 Other encephalopathy: Secondary | ICD-10-CM | POA: Diagnosis not present

## 2019-12-03 DIAGNOSIS — Z79899 Other long term (current) drug therapy: Secondary | ICD-10-CM

## 2019-12-03 DIAGNOSIS — G9340 Encephalopathy, unspecified: Secondary | ICD-10-CM | POA: Diagnosis not present

## 2019-12-03 DIAGNOSIS — M549 Dorsalgia, unspecified: Secondary | ICD-10-CM | POA: Diagnosis not present

## 2019-12-03 DIAGNOSIS — G4733 Obstructive sleep apnea (adult) (pediatric): Secondary | ICD-10-CM | POA: Diagnosis present

## 2019-12-03 DIAGNOSIS — E860 Dehydration: Secondary | ICD-10-CM | POA: Diagnosis present

## 2019-12-03 DIAGNOSIS — Z03818 Encounter for observation for suspected exposure to other biological agents ruled out: Secondary | ICD-10-CM | POA: Diagnosis not present

## 2019-12-03 DIAGNOSIS — R410 Disorientation, unspecified: Secondary | ICD-10-CM | POA: Diagnosis not present

## 2019-12-03 DIAGNOSIS — Z888 Allergy status to other drugs, medicaments and biological substances status: Secondary | ICD-10-CM

## 2019-12-03 DIAGNOSIS — S22080D Wedge compression fracture of T11-T12 vertebra, subsequent encounter for fracture with routine healing: Secondary | ICD-10-CM | POA: Diagnosis not present

## 2019-12-03 HISTORY — DX: Repeated falls: R29.6

## 2019-12-03 LAB — COMPREHENSIVE METABOLIC PANEL
ALT: 33 U/L (ref 0–44)
AST: 48 U/L — ABNORMAL HIGH (ref 15–41)
Albumin: 3.5 g/dL (ref 3.5–5.0)
Alkaline Phosphatase: 73 U/L (ref 38–126)
Anion gap: 10 (ref 5–15)
BUN: 28 mg/dL — ABNORMAL HIGH (ref 8–23)
CO2: 24 mmol/L (ref 22–32)
Calcium: 9.1 mg/dL (ref 8.9–10.3)
Chloride: 105 mmol/L (ref 98–111)
Creatinine, Ser: 1.05 mg/dL (ref 0.61–1.24)
GFR calc Af Amer: >60 mL/min (ref >60)
GFR calc non Af Amer: >60 mL/min (ref >60)
Glucose, Bld: 114 mg/dL — ABNORMAL HIGH (ref 70–99)
Potassium: 3.5 mmol/L (ref 3.5–5.1)
Sodium: 139 mmol/L (ref 135–145)
Total Bilirubin: 3.7 mg/dL — ABNORMAL HIGH (ref 0.3–1.2)
Total Protein: 6.2 g/dL — ABNORMAL LOW (ref 6.5–8.1)

## 2019-12-03 LAB — CBC WITH DIFFERENTIAL/PLATELET
Abs Immature Granulocytes: 0.02 10*3/uL (ref 0.00–0.07)
Basophils Absolute: 0 10*3/uL (ref 0.0–0.1)
Basophils Relative: 0 %
Eosinophils Absolute: 0 10*3/uL (ref 0.0–0.5)
Eosinophils Relative: 1 %
HCT: 42.4 % (ref 39.0–52.0)
Hemoglobin: 14.3 g/dL (ref 13.0–17.0)
Immature Granulocytes: 0 %
Lymphocytes Relative: 14 %
Lymphs Abs: 0.9 10*3/uL (ref 0.7–4.0)
MCH: 32.3 pg (ref 26.0–34.0)
MCHC: 33.7 g/dL (ref 30.0–36.0)
MCV: 95.7 fL (ref 80.0–100.0)
Monocytes Absolute: 0.4 10*3/uL (ref 0.1–1.0)
Monocytes Relative: 6 %
Neutro Abs: 5.1 10*3/uL (ref 1.7–7.7)
Neutrophils Relative %: 79 %
Platelets: 91 10*3/uL — ABNORMAL LOW (ref 150–400)
RBC: 4.43 MIL/uL (ref 4.22–5.81)
RDW: 13.7 % (ref 11.5–15.5)
WBC: 6.4 10*3/uL (ref 4.0–10.5)
nRBC: 0 % (ref 0.0–0.2)

## 2019-12-03 LAB — BRAIN NATRIURETIC PEPTIDE: B Natriuretic Peptide: 712 pg/mL — ABNORMAL HIGH (ref 0.0–100.0)

## 2019-12-03 LAB — TSH: TSH: 1.701 u[IU]/mL (ref 0.350–4.500)

## 2019-12-03 LAB — MAGNESIUM: Magnesium: 2 mg/dL (ref 1.7–2.4)

## 2019-12-03 LAB — SARS CORONAVIRUS 2 BY RT PCR (HOSPITAL ORDER, PERFORMED IN ~~LOC~~ HOSPITAL LAB): SARS Coronavirus 2: NEGATIVE

## 2019-12-03 LAB — CK: Total CK: 715 U/L — ABNORMAL HIGH (ref 49–397)

## 2019-12-03 MED ORDER — FENTANYL CITRATE (PF) 100 MCG/2ML IJ SOLN
25.0000 ug | Freq: Once | INTRAMUSCULAR | Status: AC
  Administered 2019-12-03: 25 ug via INTRAMUSCULAR
  Filled 2019-12-03: qty 2

## 2019-12-03 MED ORDER — FENTANYL CITRATE (PF) 100 MCG/2ML IJ SOLN
50.0000 ug | Freq: Once | INTRAMUSCULAR | Status: AC
  Administered 2019-12-03: 50 ug via INTRAMUSCULAR
  Filled 2019-12-03: qty 2

## 2019-12-03 NOTE — Progress Notes (Signed)
Orthopedic Tech Progress Note Patient Details:  John [name] 12-04-1929 [id number]  Patient ID: John [name], male   DOB: Dec 10, 1929, 86 y.o.   MRN: [id number] Called order into [facility].  Karolee Stamps 12/03/2019, 10:51 PM

## 2019-12-03 NOTE — ED Notes (Signed)
TLSO brace applied by ortho representative.

## 2019-12-03 NOTE — ED Provider Notes (Signed)
Emergency Department Provider Note   I have reviewed the triage vital signs and the nursing notes.   HISTORY  Chief Complaint Fall   HPI John [name] is a 86 y.o. male with past medical history reviewed below with daughter at bedside presents to the emergency department with frequent falls and hallucinations.  Daughter states that the patient has had balance difficulty for some time but symptoms have become acutely worse.  Over the past several weeks patient's been complaining of some left flank type pain and when he walks he is leaning to the left.  He has had multiple falls including his most recent fall which occurred sometime last night.  The patient lives alone but family checks on him daily.  The daughter saw him at 6:30 PM and when I went back to check on him in the morning he was on the ground.  He apparently been crawling around the house.  He did activate his life alert accidentally and when they responded he stated that he did not need help.  The daughter states that he has been medicated with several medicines for his left flank pain including Flexeril.  She noticed in the past 48 hours he has developed hallucinations which are mood which are primarily visual.  He reports seeing people in the house and thought that his daughter was coming at him through the ceiling.  He has never experienced loose Nations like this in the past.  The daughter states that he was diagnosed with a urinary tract infection several days ago and started on Bactrim which he has been taking.   When discussing his flank pain with the patient he describes a burning pain mainly on the left which is radiating around to the front of his belly and to his groin area. Denies CP.   The daughter did note injuries from the prior falls with skin tears.  She cleaned and dressed these areas and has been managing wounds at home.   Level 5 caveat: AMS and HOH.    Past Medical History:  Diagnosis Date  . Acute ST  elevation myocardial infarction (STEMI) of posterior wall ([place]) 11/11/2015  . CKD (chronic kidney disease), stage III   . Coronary atherosclerosis of native coronary artery    a. Multivessel status post CABG in 1992. b. STEMI 11/2015 s/p DES to insertion site of LIMA-LAD.  [name] Dilated aortic root ([place])   . Essential hypertension   . Ischemic cardiomyopathy   . LVEF <40% 11/28/2019  . Malignant melanoma of skin   . Mixed hyperlipidemia   . Nephrolithiasis   . OSA (obstructive sleep apnea)   . Pneumonia due to COVID-19 virus    March 2021  . Rosacea   . Thrombocytopenia [facility])     Patient Active Problem List   Diagnosis Date Noted  . LVEF <40% 11/28/2019  . Chronic back pain 11/27/2019  . History of recent fall 10/29/2019  . 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 Dilated aortic root ([place]) 10/29/2019  . Thrombocytopenia ([place]) 10/29/2019  . Benign prostatic hyperplasia with urinary obstruction 10/24/2019  . Urinary retention 09/19/2019  . Pneumonia due to COVID-19 virus 09/11/2019  . Sepsis ([place]) 09/11/2019  . Acute ST elevation myocardial infarction (STEMI) of posterior wall ([place]) 11/11/2015  . CAD in native artery 09/17/2013  . Essential hypertension, benign 09/17/2013  . Mixed hyperlipidemia 09/17/2013    Past Surgical History:  Procedure Laterality Date  . CARDIAC CATHETERIZATION  07/11/90  . CARDIAC  CATHETERIZATION N/A 11/11/2015   Procedure: Left Heart Cath and Coronary Angiography;  Surgeon: [name], MD;  Location: [place] [facility];  Service: Cardiovascular;  Laterality: N/A;  . CARDIAC CATHETERIZATION N/A 11/14/2015   Procedure: Coronary Stent Intervention w/Impella;  Surgeon: [name], MD;  Location: [place] [facility];  Service: Cardiovascular;  Laterality: N/A;  . carotid duplex  06/24/2011, 05/12/2009   Right and Left ICAs: Demonstrates a small amount of irregular mixed density plaque with no evidence of diameter reduction, significant tortuosity or  any other vascular abnormality. This is a mildly abnormal carotid duplex doppler evaluation.  [name] CATARACT EXTRACTION W/PHACO Right 02/11/2014   Procedure: CATARACT EXTRACTION PHACO AND INTRAOCULAR LENS PLACEMENT (IOC);  Surgeon: [name], MD;  Location: [facility];  Service: Ophthalmology;  Laterality: Right;  CDE 13.83  . CHOLECYSTECTOMY    . CORONARY ARTERY BYPASS GRAFT  1992  . DOPPLER ECHOCARDIOGRAPHY  05/23/2007   Left ventricular systolic function is normal. The transmitral spectral doppler flow pattern is suggestive of impaired LV relaxation. No significant valvular abnormalities.  . heart monitor  04/01/2009   palpitations  . HERNIA REPAIR    . MELANOMA EXCISION     neck  . NM MYOCAR PERF EJECTION FRACTION  02/10/2011   The post stress myocaardial perfusion images show a normal pattern of perfusion in all regions. No significant wall motion abnormalities noted. EKG is negative for ischemia. This is a low risk scan.  [name] TOTAL KNEE ARTHROPLASTY      Allergies Statins, Xanax [alprazolam], and Tape  Family History  Problem Relation Age of Onset  . Heart attack Father   . Skin cancer Brother   . Heart attack Brother     Social History Social History   Tobacco Use  . Smoking status: Former Smoker    Types: Cigarettes    Start date: 07/06/1943    Quit date: 07/05/1981    Years since quitting: 38.4  . Smokeless tobacco: Never Used  Substance Use Topics  . Alcohol use: No    Alcohol/week: 0.0 standard drinks  . Drug use: No    Review of Systems  Level 5 caveat: AMS   ____________________________________________   PHYSICAL EXAM:  VITAL SIGNS: ED Triage Vitals  Enc Vitals Group     BP 12/03/19 1851 106/60     Pulse Rate 12/03/19 1851 89     Resp 12/03/19 1851 16     Temp 12/03/19 1851 (!) 97.5 F (36.4 C)     Temp Source 12/03/19 1851 Oral     SpO2 12/03/19 1851 98 %     Weight 12/03/19 1851 170 lb (77.1 kg)     Height 12/03/19 1851 5\' 11"  (1.803 m)   Constitutional:  Alert but confused. Well appearing and in no acute distress. Eyes: Conjunctivae are normal. Pupils 1 mm and reactive bilaterally.  Head: Small hematoma to the left occipital scalp.  Nose: No congestion/rhinnorhea. Mouth/Throat: Mucous membranes are moist. Neck: No stridor.  No cervical spine tenderness to palpation. Cardiovascular: Normal rate, regular rhythm. Good peripheral circulation. Grossly normal heart sounds.   Respiratory: Normal respiratory effort.  No retractions. Lungs CTAB. Gastrointestinal: Soft and nontender. No distention.  Musculoskeletal: No lower extremity tenderness nor edema. No gross deformities of extremities. Normal ROM of all extremities.  Neurologic:  Normal speech and language. No gross focal neurologic deficits are appreciated. No facial asymmetry. 5/5 strength in the upper and lower extremities bilaterally.  Skin:  Skin is warm and dry. No  cellulitis or abscess.  Skin tears noted to the skin tears noted to the back of the left upper arm and bilateral forearms.  No laceration.   ____________________________________________   LABS (all labs ordered are listed, but only abnormal results are displayed)  Labs Reviewed  COMPREHENSIVE METABOLIC PANEL - Abnormal; Notable for the following components:      Result Value   Glucose, Bld 114 (*)    BUN 28 (*)    Total Protein 6.2 (*)    AST 48 (*)    Total Bilirubin 3.7 (*)    All other components within normal limits  CBC WITH DIFFERENTIAL/PLATELET - Abnormal; Notable for the following components:   Platelets 91 (*)    All other components within normal limits  CK - Abnormal; Notable for the following components:   Total CK 715 (*)    All other components within normal limits  BRAIN NATRIURETIC PEPTIDE - Abnormal; Notable for the following components:   B Natriuretic Peptide 712.0 (*)    All other components within normal limits  URINE CULTURE  SARS CORONAVIRUS 2 BY RT PCR (HOSPITAL ORDER, [place] [facility])  MAGNESIUM  TSH  URINALYSIS, ROUTINE W REFLEX MICROSCOPIC   ____________________________________________  EKG   EKG Interpretation  Date/Time:  Monday Dec 03 2019 19:25:53 EDT Ventricular Rate:  84 PR Interval:    QRS Duration: 130 QT Interval:  399 QTC Calculation: 472 R Axis:   39 Text Interpretation: Junction rhythm Left bundle branch block Similar to prior. No STEMI Confirmed by Nanda Quinton [phone]) on 12/03/2019 7:39:25 PM       ____________________________________________  RADIOLOGY  CT Head Wo Contrast  Result Date: 12/03/2019 CLINICAL DATA:  Multiple recent falls with headaches and confusion, initial encounter EXAM: CT HEAD WITHOUT CONTRAST CT CERVICAL SPINE WITHOUT CONTRAST TECHNIQUE: Multidetector CT imaging of the head and cervical spine was performed following the standard protocol without intravenous contrast. Multiplanar CT image reconstructions of the cervical spine were also generated. COMPARISON:  02/16/2019 FINDINGS: CT HEAD FINDINGS Brain: Chronic atrophic and ischemic changes are again noted and stable. No findings to suggest acute hemorrhage, acute infarction or space-occupying mass lesion are seen. Vascular: No hyperdense vessel or unexpected calcification. Skull: Normal. Negative for fracture or focal lesion. Sinuses/Orbits: No acute finding. Other: None. CT CERVICAL SPINE FINDINGS Alignment: Within normal limits. Skull base and vertebrae: 7 cervical segments are well visualized. Vertebral body height is well maintained. No acute fracture or acute facet abnormality is noted. Multilevel facet hypertrophic changes are seen. Disc space narrowing at C5-6 and C6-7 with associated osteophytes is noted. Soft tissues and spinal canal: Diffuse vascular calcifications are noted. No acute soft tissue abnormality is noted. Upper chest: Visualized lung apices are unremarkable. Other: None IMPRESSION: CT of the head: Chronic atrophic and ischemic changes  without acute abnormality. CT of the cervical spine: Multilevel degenerative change without acute abnormality. Electronically Signed   By: Inez Catalina M.D.   On: 12/03/2019 21:06   CT Cervical Spine Wo Contrast  Result Date: 12/03/2019 CLINICAL DATA:  Multiple recent falls with headaches and confusion, initial encounter EXAM: CT HEAD WITHOUT CONTRAST CT CERVICAL SPINE WITHOUT CONTRAST TECHNIQUE: Multidetector CT imaging of the head and cervical spine was performed following the standard protocol without intravenous contrast. Multiplanar CT image reconstructions of the cervical spine were also generated. COMPARISON:  02/16/2019 FINDINGS: CT HEAD FINDINGS Brain: Chronic atrophic and ischemic changes are again noted and stable. No findings to suggest acute hemorrhage,  acute infarction or space-occupying mass lesion are seen. Vascular: No hyperdense vessel or unexpected calcification. Skull: Normal. Negative for fracture or focal lesion. Sinuses/Orbits: No acute finding. Other: None. CT CERVICAL SPINE FINDINGS Alignment: Within normal limits. Skull base and vertebrae: 7 cervical segments are well visualized. Vertebral body height is well maintained. No acute fracture or acute facet abnormality is noted. Multilevel facet hypertrophic changes are seen. Disc space narrowing at C5-6 and C6-7 with associated osteophytes is noted. Soft tissues and spinal canal: Diffuse vascular calcifications are noted. No acute soft tissue abnormality is noted. Upper chest: Visualized lung apices are unremarkable. Other: None IMPRESSION: CT of the head: Chronic atrophic and ischemic changes without acute abnormality. CT of the cervical spine: Multilevel degenerative change without acute abnormality. Electronically Signed   By: Inez Catalina M.D.   On: 12/03/2019 21:06   DG Chest Portable 1 View  Result Date: 12/03/2019 CLINICAL DATA:  86 year old male with altered mental status. EXAM: PORTABLE CHEST 1 VIEW COMPARISON:  Chest  radiograph dated 09/11/2019. FINDINGS: Minimal left lung base density, likely atelectasis. Residual or recurrent infiltrate is less likely but not excluded clinical correlation is recommended. No lobar consolidation, pleural effusion, pneumothorax. Mild cardiomegaly. Atherosclerotic calcification of the aorta. Median sternotomy wires and CABG vascular clips. No acute osseous pathology. Right upper quadrant cholecystectomy clips. IMPRESSION: 1. Probable atelectasis at the left lung base. 2. Mild cardiomegaly. Electronically Signed   By: Anner Crete M.D.   On: 12/03/2019 19:23   CT Renal Stone Study  Result Date: 12/03/2019 CLINICAL DATA:  86 year old male with left flank pain. Concern for kidney stone. EXAM: CT ABDOMEN AND PELVIS WITHOUT CONTRAST TECHNIQUE: Multidetector CT imaging of the abdomen and pelvis was performed following the standard protocol without IV contrast. COMPARISON:  None. FINDINGS: Evaluation of this exam is limited in the absence of intravenous contrast. Lower chest: Left lung base subpleural density likely represents a small pleural effusion and associated left lung base atelectasis or infiltrate. Clinical correlation is recommended. Three vessel coronary vascular calcification. No intra-abdominal free air or free fluid. Hepatobiliary: The liver is unremarkable. No intrahepatic biliary ductal dilatation. Cholecystectomy. No retained calcified stone noted in the central CBD. Pancreas: Unremarkable. No pancreatic ductal dilatation or surrounding inflammatory changes. Spleen: Normal in size without focal abnormality. Adrenals/Urinary Tract: The adrenal glands are unremarkable. There is a 12 mm right renal upper pole nonobstructing stone. No hydronephrosis. Right renal interpolar hypodense lesion measures up to 4 cm. This is suboptimally characterized but demonstrates fluid attenuation, likely a cyst. There is no hydronephrosis or nephrolithiasis on the left. The visualized ureters and  urinary bladder appear unremarkable. Stomach/Bowel: There is moderate stool throughout the colon. Several scattered sigmoid diverticula without active inflammatory changes. There is no bowel obstruction or active inflammation. The appendix is normal. Vascular/Lymphatic: Advanced aortoiliac atherosclerotic disease. The IVC is unremarkable. No portal venous gas. There is no adenopathy. Reproductive: The prostate and seminal vesicles are grossly unremarkable. No pelvic masses Other: None Musculoskeletal: Osteopenia with scoliosis and degenerative changes of the spine. There is compression fracture of the T12 vertebra with near complete loss of vertebral body height, age indeterminate but likely acute or subacute. There is approximately 9 mm retropulsion of the posterior vertebral body. There is also compression fracture of the superior endplate of L1 with approximately 50% loss of vertebral body height. There is mild perivertebral hematoma at T12 with extension along the left pleural space. There is moderate to severe narrowing of the central canal at T12 due to  retropulsion. Correlation with neurological exam recommended. If there is neurological deficit or concern for cord compression, further evaluation with MRI is advised. There is nondisplaced fracture of the left L1 transverse process. IMPRESSION: 1. Acute appearing compression fractures of the T12 with near complete loss of vertebral body height and 9 mm retropulsion of the posterior vertebral body. There is moderate to severe narrowing of the central canal at T12 due to retropulsion. Correlation with neurological exam recommended. If there is neurological deficit or concern for cord compression, further evaluation with MRI is advised. 2. Acute appearing compression fracture of superior endplate of L1 with approximately 50% loss of vertebral body height. 3. Small left pleural effusion or hemorrhagic fluid related to vertebral fracture. 4. A 12 mm nonobstructing  right renal upper pole stone. No hydronephrosis. 5. Moderate colonic stool burden. No bowel obstruction. Normal appendix. 6. Aortic Atherosclerosis (ICD10-I70.0). Electronically Signed   By: Anner Crete M.D.   On: 12/03/2019 21:16    ____________________________________________   PROCEDURES  Procedure(s) performed:   Procedures  CRITICAL CARE Performed by: Margette Fast Total critical care time: 35 minutes Critical care time was exclusive of separately billable procedures and treating other patients. Critical care was necessary to treat or prevent imminent or life-threatening deterioration. Critical care was time spent personally by me on the following activities: development of treatment plan with patient and/or surrogate as well as nursing, discussions with consultants, evaluation of patient's response to treatment, examination of patient, obtaining history from patient or surrogate, ordering and performing treatments and interventions, ordering and review of laboratory studies, ordering and review of radiographic studies, pulse oximetry and re-evaluation of patient's condition.  Nanda Quinton, MD Emergency Medicine  ____________________________________________   INITIAL IMPRESSION / ASSESSMENT AND PLAN / ED COURSE  Pertinent labs & imaging results that were available during my care of the patient were reviewed by me and considered in my medical decision making (see chart for details).   Patient presents to the emergency department with frequent falls, new visual hallucination, left flank pain.  He has been started on multiple medications recently including Bactrim for UTI and Flexeril for pain.  Patient presents with symptoms of delirium.  Reviewed the UA from several days ago.  No culture available.  Plan for screening blood work, chest x-ray along with CT imaging. No focal deficit to suspect CVA.  10:10 PM  Spoke with Dr. Vertell Limber with patient would likely benefit from being at  [facility] where patient can have an MRI of the lumbar spine and he can be evaluated by the neurosurgery team.  There will need to be some discussions regarding surgical management versus pain control/PT/OT. Will discuss with TRH here. Will order TLSO brace. Patient to be on spine precautions here.   Discussed patient's case with TRH to request admission. Patient and family (if present) updated with plan. Care transferred to [facility] service.  I reviewed all nursing notes, vitals, pertinent old records, EKGs, labs, imaging (as available).  ____________________________________________  FINAL CLINICAL IMPRESSION(S) / ED DIAGNOSES  Final diagnoses:  Compression fracture of T12 vertebra, initial encounter ([place])  Fall, initial encounter  Injury of head, initial encounter    MEDICATIONS GIVEN DURING THIS VISIT:  Medications  fentaNYL (SUBLIMAZE) injection 25 mcg (25 mcg Intramuscular Given 12/03/19 2029)  fentaNYL (SUBLIMAZE) injection 50 mcg (50 mcg Intramuscular Given 12/03/19 2124)     Note:  This document was prepared using Dragon voice recognition software and may include unintentional dictation errors.  Nanda Quinton, MD,  [facility]    Linden Mikes, [facility], MD 12/03/19 2314

## 2019-12-03 NOTE — ED Triage Notes (Signed)
Per pt's granddaughter pt has fallen three times in the last 2 days, hallucinating, and very confused. Was treated for a bladder infection last week and is now having burning all in lower abd. Has cuts on both arms and a bump on his head. Has had problems with his balance but not to this extent.

## 2019-12-03 NOTE — H&P (Signed)
TRH H&P    Patient Demographics:    John [name], is a 86 y.o. male  MRN: [id number]  DOB - 1930/02/03  Admit Date - 12/03/2019  Referring MD/NP/PA: Nanda Quinton  Outpatient Primary MD for the patient is Annie Main, FNP  Patient coming from: Home  Chief complaint-fall, back pain   HPI:    John [name]  is a 86 y.o. male, with history of CAD s/p CABG in 1022, STEMI in 11/2015 with DES to LIMA-LAD insertion site, chronic combined systolic and diastolic CHF, ischemic cardiomyopathy, EF 40 to 45% in 11/2015, hypertension, hyperlipidemia, OSA, chronic thrombocytopenia was brought to the ED by patient's granddaughter due to frequent falls and hallucinations.  As per granddaughter over the past several weeks patient has been complaining of left flank pain antiantitoxin Swords left.  He had multiple falls with most recent fall which occurred sometime last night.  Patient will be himself but family checks on him. Granddaughter saw him at 6:30 PM when she went back to check Monday morning he was on the ground.  He was definitely crawling around the house.  He was recently seen by his PCP and he was prescribed Flexeril for left neck pain and also was started on Bactrim for questionable UTI.  Patient started having hallucinations.  He started seeing people in the house and thought that his daughter was coming at him to the ceiling. Patient describes burning pain mainly on the left with radiation to the front of his belly and groin area. Denies chest pain or shortness of breath. Denies nausea vomiting or diarrhea. In the ED, CT renal pelvis showed T12 compression fracture with retropulsion with impingement on the central canal.  Concern for cord compression.  ED provider called neurosurgeon, Dr. Vertell Limber, who recommended patient to be transferred to [facility] for MRI of thoracic spine and then and neurosurgeon evaluate him  for possible surgery.    Review of systems:    In addition to the HPI above,    All other systems reviewed and are negative.    Past History of the following :    Past Medical History:  Diagnosis Date  . Acute ST elevation myocardial infarction (STEMI) of posterior wall ([place]) 11/11/2015  . CKD (chronic kidney disease), stage III   . Coronary atherosclerosis of native coronary artery    a. Multivessel status post CABG in 1992. b. STEMI 11/2015 s/p DES to insertion site of LIMA-LAD.  [name] Dilated aortic root ([place])   . Essential hypertension   . Ischemic cardiomyopathy   . LVEF <40% 11/28/2019  . Malignant melanoma of skin   . Mixed hyperlipidemia   . Nephrolithiasis   . OSA (obstructive sleep apnea)   . Pneumonia due to COVID-19 virus    March 2021  . Rosacea   . Thrombocytopenia ([place])       Past Surgical History:  Procedure Laterality Date  . CARDIAC CATHETERIZATION  07/11/90  . CARDIAC CATHETERIZATION N/A 11/11/2015   Procedure: Left Heart Cath and Coronary Angiography;  Surgeon: [name], MD;  Location: [facility];  Service: Cardiovascular;  Laterality: N/A;  . CARDIAC CATHETERIZATION N/A 11/14/2015   Procedure: Coronary Stent Intervention w/Impella;  Surgeon: [name], MD;  Location: [facility];  Service: Cardiovascular;  Laterality: N/A;  . carotid duplex  06/24/2011, 05/12/2009   Right and Left ICAs: Demonstrates a small amount of irregular mixed density plaque with no evidence of diameter reduction, significant tortuosity or any other vascular abnormality. This is a mildly abnormal carotid duplex doppler evaluation.  [name] CATARACT EXTRACTION W/PHACO Right 02/11/2014   Procedure: CATARACT EXTRACTION PHACO AND INTRAOCULAR LENS PLACEMENT (IOC);  Surgeon: [name], MD;  Location: [facility];  Service: Ophthalmology;  Laterality: Right;  CDE 13.83  . CHOLECYSTECTOMY    . CORONARY ARTERY BYPASS GRAFT  1992  . DOPPLER ECHOCARDIOGRAPHY  05/23/2007   Left  ventricular systolic function is normal. The transmitral spectral doppler flow pattern is suggestive of impaired LV relaxation. No significant valvular abnormalities.  . heart monitor  04/01/2009   palpitations  . HERNIA REPAIR    . MELANOMA EXCISION     neck  . NM MYOCAR PERF EJECTION FRACTION  02/10/2011   The post stress myocaardial perfusion images show a normal pattern of perfusion in all regions. No significant wall motion abnormalities noted. EKG is negative for ischemia. This is a low risk scan.  [name] TOTAL KNEE ARTHROPLASTY        Social History:      Social History   Tobacco Use  . Smoking status: Former Smoker    Types: Cigarettes    Start date: 07/06/1943    Quit date: 07/05/1981    Years since quitting: 38.4  . Smokeless tobacco: Never Used  Substance Use Topics  . Alcohol use: No    Alcohol/week: 0.0 standard drinks       Family History :     Family History  Problem Relation Age of Onset  . Heart attack Father   . Skin cancer Brother   . Heart attack Brother       Home Medications:   Prior to Admission medications   Medication Sig Start Date End Date Taking? Authorizing Provider  aspirin EC 81 MG tablet Take 81 mg by mouth daily.   Yes [provider]  clotrimazole-betamethasone (LOTRISONE) cream Apply 1 application topically 2 (two) times daily as needed (for irritation).    Yes [provider]  fludrocortisone (FLORINEF) 0.1 MG tablet Take 100 mcg by mouth daily. 05/22/19  Yes [provider]  furosemide (LASIX) 20 MG tablet Take 1 tablet (20 mg total) by mouth daily. 05/28/19  Yes [name], MD  ibuprofen (ADVIL) 400 MG tablet Take 400 mg by mouth every 6 (six) hours as needed for mild pain or moderate pain.    Yes [provider]  metoprolol succinate (TOPROL-XL) 25 MG 24 hr tablet Take 1 tablet by mouth twice daily Patient taking differently: Take 25 mg by mouth in the morning and at bedtime.  10/25/19  Yes  [name], MD  neomycin-polymyxin b-dexamethasone (MAXITROL) 3.5-10000-0.1 OINT Place 1 application into the left eye daily.  07/10/19  Yes [provider]  nitroGLYCERIN (NITROSTAT) 0.4 MG SL tablet Place 1 tablet (0.4 mg total) under the tongue every 5 (five) minutes x 3 doses as needed for chest pain. 11/16/15  Yes Kilroy, Luke K, PA-C  pantoprazole (PROTONIX) 40 MG tablet Take 40 mg by mouth daily. 10/29/13  Yes [provider]  potassium chloride SA (  KLOR-CON) 20 MEQ tablet Take 2 tablets (40 mEq total) by mouth daily. Patient taking differently: Take 20 mEq by mouth daily.  10/12/19  Yes Strader, [name] M, PA-C  sacubitril-valsartan (ENTRESTO) 24-26 MG Take 1 tablet by mouth 2 (two) times daily. 06/28/19  Yes [name], MD  spironolactone (ALDACTONE) 25 MG tablet Take 0.5 tablets (12.5 mg total) by mouth daily. 11/28/19 02/26/20 Yes [name], MD  tamsulosin (FLOMAX) 0.4 MG CAPS capsule Take 1 capsule (0.4 mg total) by mouth daily. 10/24/19  Yes McKenzie, Candee Furbish, MD  cyclobenzaprine (FLEXERIL) 5 MG tablet Take 1 tablet (5 mg total) by mouth 2 (two) times daily as needed for muscle spasms. Patient not taking: Reported on 12/03/2019 11/27/19   Annie Main, FNP  sulfamethoxazole-trimethoprim (BACTRIM DS) 800-160 MG tablet Take 1 tablet by mouth 2 (two) times daily. Patient not taking: Reported on 12/03/2019 11/27/19   Annie Main, FNP     Allergies:     Allergies  Allergen Reactions  . Statins Other (See Comments)    Weak,nervous  . Xanax [Alprazolam] Other (See Comments)    Hallucination  . Tape Rash     Physical Exam:   Vitals  Blood pressure (!) 95/58, pulse 84, temperature (!) 97.5 F (36.4 C), temperature source Oral, resp. rate 15, height 5\' 11"  (1.803 m), weight 77.1 kg, SpO2 97 %.  1.  General: Appears in no acute distress  2. Psychiatric: Alert, oriented x3, intact insight and judgment  3. Neurologic: Cranial nerves  II through XII grossly intact, no focal deficit noted, moving all extremities, sensations intact bilaterally, reflexes diminished bilaterally  4. HEENMT:  Atraumatic normocephalic, extraocular muscles are intact  5. Respiratory : Clear to auscultation bilaterally, no wheezing or crackles auscultated  6. Cardiovascular : S1-S2, regular, no murmur auscultated  7. Gastrointestinal:  Abdomen is soft, nontender, no organomegaly  8. Skin:  No rashes noted  9.Musculoskeletal:  Tenderness at left flank region, motor strength 5/5 in both lower extremities, sensations intact bilaterally.  No limitation of range of motion at hip joint    Data Review:    CBC Recent Labs  Lab 12/03/19 2022  WBC 6.4  HGB 14.3  HCT 42.4  PLT 91*  MCV 95.7  MCH 32.3  MCHC 33.7  RDW 13.7  LYMPHSABS 0.9  MONOABS 0.4  EOSABS 0.0  BASOSABS 0.0   ------------------------------------------------------------------------------------------------------------------  Results for orders placed or performed during the hospital encounter of 12/03/19 (from the past 48 hour(s))  Comprehensive metabolic panel     Status: Abnormal   Collection Time: 12/03/19  8:22 PM  Result Value Ref Range   Sodium 139 135 - 145 mmol/L   Potassium 3.5 3.5 - 5.1 mmol/L   Chloride 105 98 - 111 mmol/L   CO2 24 22 - 32 mmol/L   Glucose, Bld 114 (H) 70 - 99 mg/dL    Comment: Glucose reference range applies only to samples taken after fasting for at least 8 hours.   BUN 28 (H) 8 - 23 mg/dL   Creatinine, Ser 1.05 0.61 - 1.24 mg/dL   Calcium 9.1 8.9 - 10.3 mg/dL   Total Protein 6.2 (L) 6.5 - 8.1 g/dL   Albumin 3.5 3.5 - 5.0 g/dL   AST 48 (H) 15 - 41 U/L   ALT 33 0 - 44 U/L   Alkaline Phosphatase 73 38 - 126 U/L   Total Bilirubin 3.7 (H) 0.3 - 1.2 mg/dL   GFR calc non Af  Amer >60 >60 mL/min   GFR calc Af Amer >60 >60 mL/min   Anion gap 10 5 - 15    Comment: Performed at [facility], [address], [place], [place] [address]    CBC with Differential     Status: Abnormal   Collection Time: 12/03/19  8:22 PM  Result Value Ref Range   WBC 6.4 4.0 - 10.5 K/uL   RBC 4.43 4.22 - 5.81 MIL/uL   Hemoglobin 14.3 13.0 - 17.0 g/dL   HCT 42.4 39.0 - 52.0 %   MCV 95.7 80.0 - 100.0 fL   MCH 32.3 26.0 - 34.0 pg   MCHC 33.7 30.0 - 36.0 g/dL   RDW 13.7 11.5 - 15.5 %   Platelets 91 (L) 150 - 400 K/uL    Comment: SPECIMEN CHECKED FOR CLOTS PLATELET COUNT CONFIRMED BY SMEAR    nRBC 0.0 0.0 - 0.2 %   Neutrophils Relative % 79 %   Neutro Abs 5.1 1.7 - 7.7 K/uL   Lymphocytes Relative 14 %   Lymphs Abs 0.9 0.7 - 4.0 K/uL   Monocytes Relative 6 %   Monocytes Absolute 0.4 0.1 - 1.0 K/uL   Eosinophils Relative 1 %   Eosinophils Absolute 0.0 0.0 - 0.5 K/uL   Basophils Relative 0 %   Basophils Absolute 0.0 0.0 - 0.1 K/uL   Immature Granulocytes 0 %   Abs Immature Granulocytes 0.02 0.00 - 0.07 K/uL    Comment: Performed at [facility], [address], [place], [place] [address]  Magnesium     Status: None   Collection Time: 12/03/19  8:22 PM  Result Value Ref Range   Magnesium 2.0 1.7 - 2.4 mg/dL    Comment: Performed at [facility], [address] [place], [place] [address]  TSH     Status: None   Collection Time: 12/03/19  8:22 PM  Result Value Ref Range   TSH 1.701 0.350 - 4.500 uIU/mL    Comment: Performed by a 3rd Generation assay with a functional sensitivity of <=0.01 uIU/mL. Performed at [facility], [address], [place], [place] [address]   CK     Status: Abnormal   Collection Time: 12/03/19  8:22 PM  Result Value Ref Range   Total CK 715 (H) 49 - 397 U/L    Comment: Performed at [facility], [address] [place], [place] [address]  Brain natriuretic peptide     Status: Abnormal   Collection Time: 12/03/19  8:22 PM  Result Value Ref Range   B Natriuretic Peptide 712.0 (H) 0.0 - 100.0 pg/mL    Comment: Performed at [facility], [address] [place], [place] [address]  SARS Coronavirus 2 by RT PCR  (hospital order, performed in [facility] hospital lab) Nasopharyngeal Nasopharyngeal Swab     Status: None   Collection Time: 12/03/19 10:20 PM   Specimen: Nasopharyngeal Swab  Result Value Ref Range   SARS Coronavirus 2 NEGATIVE NEGATIVE    Comment: (NOTE) SARS-CoV-2 target nucleic acids are NOT DETECTED. The SARS-CoV-2 RNA is generally detectable in upper and lower respiratory specimens during the acute phase of infection. The lowest concentration of SARS-CoV-2 viral copies this assay can detect is 250 copies / mL. A negative result does not preclude SARS-CoV-2 infection and should not be used as the sole basis for treatment or other patient management decisions.  A negative result may occur with improper specimen collection / handling, submission of specimen other than nasopharyngeal swab, presence of  viral mutation(s) within the areas targeted by this assay, and inadequate number of viral copies (<250 copies / mL). A negative result must be combined with clinical observations, patient history, and epidemiological information. Fact Sheet for Patients:   StrictlyIdeas.no Fact Sheet for Healthcare Providers: BankingDealers.co.za This test is not yet approved or cleared  by the [place] FDA and has been authorized for detection and/or diagnosis of SARS-CoV-2 by FDA under an Emergency Use Authorization (EUA).  This EUA will remain in effect (meaning this test can be used) for the duration of the COVID-19 declaration under Section 564(b)(1) of the Act, 21 U.S.C. section 360bbb-3(b)(1), unless the authorization is terminated or revoked sooner. Performed at [facility], [address] [place], [place] [address]     Chemistries  Recent Labs  Lab 12/03/19 2022  NA 139  K 3.5  CL 105  CO2 24  GLUCOSE 114*  BUN 28*  CREATININE 1.05  CALCIUM 9.1  MG 2.0  AST 48*  ALT 33  ALKPHOS 73  BILITOT 3.7*    ------------------------------------------------------------------------------------------------------------------  ------------------------------------------------------------------------------------------------------------------ GFR: Estimated Creatinine Clearance: 52.8 mL/min (by C-G formula based on SCr of 1.05 mg/dL). Liver Function Tests: Recent Labs  Lab 12/03/19 2022  AST 48*  ALT 33  ALKPHOS 73  BILITOT 3.7*  PROT 6.2*  ALBUMIN 3.5   No results for input(s): LIPASE, AMYLASE in the last 168 hours. No results for input(s): AMMONIA in the last 168 hours. Coagulation Profile: No results for input(s): INR, PROTIME in the last 168 hours. Cardiac Enzymes: Recent Labs  Lab 12/03/19 2022  CKTOTAL 715*   BNP (last 3 results) No results for input(s): PROBNP in the last 8760 hours. HbA1C: No results for input(s): HGBA1C in the last 72 hours. CBG: No results for input(s): GLUCAP in the last 168 hours. Lipid Profile: No results for input(s): CHOL, HDL, LDLCALC, TRIG, CHOLHDL, LDLDIRECT in the last 72 hours. Thyroid Function Tests: Recent Labs    12/03/19 2022  TSH 1.701   Anemia Panel: No results for input(s): VITAMINB12, FOLATE, FERRITIN, TIBC, IRON, RETICCTPCT in the last 72 hours.  --------------------------------------------------------------------------------------------------------------- Urine analysis:    Component Value Date/Time   COLORURINE YELLOW 11/27/2019 1448   APPEARANCEUR CLEAR 11/27/2019 1448   LABSPEC 1.020 11/27/2019 1448   PHURINE 5.5 11/27/2019 1448   GLUCOSEU NEGATIVE 11/27/2019 1448   HGBUR NEGATIVE 11/27/2019 1448   BILIRUBINUR NEGATIVE 09/11/2019 0913   KETONESUR NEGATIVE 11/27/2019 1448   PROTEINUR NEGATIVE 11/27/2019 1448   UROBILINOGEN 0.2 11/28/2009 1120   NITRITE NEGATIVE 11/27/2019 1448   LEUKOCYTESUR TRACE (A) 11/27/2019 1448      Imaging Results:    CT Head Wo Contrast  Result Date: 12/03/2019 CLINICAL DATA:  Multiple  recent falls with headaches and confusion, initial encounter EXAM: CT HEAD WITHOUT CONTRAST CT CERVICAL SPINE WITHOUT CONTRAST TECHNIQUE: Multidetector CT imaging of the head and cervical spine was performed following the standard protocol without intravenous contrast. Multiplanar CT image reconstructions of the cervical spine were also generated. COMPARISON:  02/16/2019 FINDINGS: CT HEAD FINDINGS Brain: Chronic atrophic and ischemic changes are again noted and stable. No findings to suggest acute hemorrhage, acute infarction or space-occupying mass lesion are seen. Vascular: No hyperdense vessel or unexpected calcification. Skull: Normal. Negative for fracture or focal lesion. Sinuses/Orbits: No acute finding. Other: None. CT CERVICAL SPINE FINDINGS Alignment: Within normal limits. Skull base and vertebrae: 7 cervical segments are well visualized. Vertebral body height is well maintained. No acute fracture or acute facet abnormality is noted.  Multilevel facet hypertrophic changes are seen. Disc space narrowing at C5-6 and C6-7 with associated osteophytes is noted. Soft tissues and spinal canal: Diffuse vascular calcifications are noted. No acute soft tissue abnormality is noted. Upper chest: Visualized lung apices are unremarkable. Other: None IMPRESSION: CT of the head: Chronic atrophic and ischemic changes without acute abnormality. CT of the cervical spine: Multilevel degenerative change without acute abnormality. Electronically Signed   By: Inez Catalina M.D.   On: 12/03/2019 21:06   CT Cervical Spine Wo Contrast  Result Date: 12/03/2019 CLINICAL DATA:  Multiple recent falls with headaches and confusion, initial encounter EXAM: CT HEAD WITHOUT CONTRAST CT CERVICAL SPINE WITHOUT CONTRAST TECHNIQUE: Multidetector CT imaging of the head and cervical spine was performed following the standard protocol without intravenous contrast. Multiplanar CT image reconstructions of the cervical spine were also generated.  COMPARISON:  02/16/2019 FINDINGS: CT HEAD FINDINGS Brain: Chronic atrophic and ischemic changes are again noted and stable. No findings to suggest acute hemorrhage, acute infarction or space-occupying mass lesion are seen. Vascular: No hyperdense vessel or unexpected calcification. Skull: Normal. Negative for fracture or focal lesion. Sinuses/Orbits: No acute finding. Other: None. CT CERVICAL SPINE FINDINGS Alignment: Within normal limits. Skull base and vertebrae: 7 cervical segments are well visualized. Vertebral body height is well maintained. No acute fracture or acute facet abnormality is noted. Multilevel facet hypertrophic changes are seen. Disc space narrowing at C5-6 and C6-7 with associated osteophytes is noted. Soft tissues and spinal canal: Diffuse vascular calcifications are noted. No acute soft tissue abnormality is noted. Upper chest: Visualized lung apices are unremarkable. Other: None IMPRESSION: CT of the head: Chronic atrophic and ischemic changes without acute abnormality. CT of the cervical spine: Multilevel degenerative change without acute abnormality. Electronically Signed   By: Inez Catalina M.D.   On: 12/03/2019 21:06   DG Chest Portable 1 View  Result Date: 12/03/2019 CLINICAL DATA:  86 year old male with altered mental status. EXAM: PORTABLE CHEST 1 VIEW COMPARISON:  Chest radiograph dated 09/11/2019. FINDINGS: Minimal left lung base density, likely atelectasis. Residual or recurrent infiltrate is less likely but not excluded clinical correlation is recommended. No lobar consolidation, pleural effusion, pneumothorax. Mild cardiomegaly. Atherosclerotic calcification of the aorta. Median sternotomy wires and CABG vascular clips. No acute osseous pathology. Right upper quadrant cholecystectomy clips. IMPRESSION: 1. Probable atelectasis at the left lung base. 2. Mild cardiomegaly. Electronically Signed   By: Anner Crete M.D.   On: 12/03/2019 19:23   CT Renal Stone Study  Result  Date: 12/03/2019 CLINICAL DATA:  86 year old male with left flank pain. Concern for kidney stone. EXAM: CT ABDOMEN AND PELVIS WITHOUT CONTRAST TECHNIQUE: Multidetector CT imaging of the abdomen and pelvis was performed following the standard protocol without IV contrast. COMPARISON:  None. FINDINGS: Evaluation of this exam is limited in the absence of intravenous contrast. Lower chest: Left lung base subpleural density likely represents a small pleural effusion and associated left lung base atelectasis or infiltrate. Clinical correlation is recommended. Three vessel coronary vascular calcification. No intra-abdominal free air or free fluid. Hepatobiliary: The liver is unremarkable. No intrahepatic biliary ductal dilatation. Cholecystectomy. No retained calcified stone noted in the central CBD. Pancreas: Unremarkable. No pancreatic ductal dilatation or surrounding inflammatory changes. Spleen: Normal in size without focal abnormality. Adrenals/Urinary Tract: The adrenal glands are unremarkable. There is a 12 mm right renal upper pole nonobstructing stone. No hydronephrosis. Right renal interpolar hypodense lesion measures up to 4 cm. This is suboptimally characterized but demonstrates fluid attenuation, likely  a cyst. There is no hydronephrosis or nephrolithiasis on the left. The visualized ureters and urinary bladder appear unremarkable. Stomach/Bowel: There is moderate stool throughout the colon. Several scattered sigmoid diverticula without active inflammatory changes. There is no bowel obstruction or active inflammation. The appendix is normal. Vascular/Lymphatic: Advanced aortoiliac atherosclerotic disease. The IVC is unremarkable. No portal venous gas. There is no adenopathy. Reproductive: The prostate and seminal vesicles are grossly unremarkable. No pelvic masses Other: None Musculoskeletal: Osteopenia with scoliosis and degenerative changes of the spine. There is compression fracture of the T12 vertebra with  near complete loss of vertebral body height, age indeterminate but likely acute or subacute. There is approximately 9 mm retropulsion of the posterior vertebral body. There is also compression fracture of the superior endplate of L1 with approximately 50% loss of vertebral body height. There is mild perivertebral hematoma at T12 with extension along the left pleural space. There is moderate to severe narrowing of the central canal at T12 due to retropulsion. Correlation with neurological exam recommended. If there is neurological deficit or concern for cord compression, further evaluation with MRI is advised. There is nondisplaced fracture of the left L1 transverse process. IMPRESSION: 1. Acute appearing compression fractures of the T12 with near complete loss of vertebral body height and 9 mm retropulsion of the posterior vertebral body. There is moderate to severe narrowing of the central canal at T12 due to retropulsion. Correlation with neurological exam recommended. If there is neurological deficit or concern for cord compression, further evaluation with MRI is advised. 2. Acute appearing compression fracture of superior endplate of L1 with approximately 50% loss of vertebral body height. 3. Small left pleural effusion or hemorrhagic fluid related to vertebral fracture. 4. A 12 mm nonobstructing right renal upper pole stone. No hydronephrosis. 5. Moderate colonic stool burden. No bowel obstruction. Normal appendix. 6. Aortic Atherosclerosis (ICD10-I70.0). Electronically Signed   By: Anner Crete M.D.   On: 12/03/2019 21:16    My personal review of EKG: Rhythm -junctional rhythm   Assessment & Plan:    Active Problems:   Compression fracture of T12 vertebra (HCC)   1. Compression fracture of T10 vertebra-CT renal study showed T12 compression fracture but complete loss of vertebral body height and 9 mm retropulsion of the posterior vertebral body.  Moderate to severe narrowing of central canal at  T12 due to retropulsion.  Neurosurgeon Dr. Vertell Limber was consulted and he recommended patient be transferred to [facility] and MRI thoracic spine.  We will transfer patient to [facility] for neurosurgical evaluation, also obtain MRI of thoracic spine.  Will call neurosurgeon once MRI thoracic spine is resulted.  This has been conveyed to the covering physician Dr. Marice Potter at [facility], will avoid IV opioids due to hypotension.  Will start oxycodone 5 mg every 4 hours as needed for pain. 2. CAD s/p CABG-continue aspirin, Plavix, Toprol-XL 25 mg p.o. twice daily. 3. Chronic combined systolic and diastolic CHF/ischemic cardiomyopathy-currently no acute exacerbation, continue Toprol-XL 25 mg daily, Entresto.  Will hold Lasix at this time due to low blood pressure.  If patient starts to develop fluid overload, consider restarting Lasix. 4. Hypertension-blood pressure is stable, continue Aldactone, metoprolol.    DVT Prophylaxis-    SCDs   AM Labs Ordered, also please review Full Orders  Family Communication: Admission, patients condition and plan of care including tests being ordered have been discussed with the patient and who indicate understanding and agree with the plan  and Code Status.  Code Status: DNR  Admission status: Inpatient :The appropriate admission status for this patient is INPATIENT. Inpatient status is judged to be reasonable and necessary in order to provide the required intensity of service to ensure the patient's safety. The patient's presenting symptoms, physical exam findings, and initial radiographic and laboratory data in the context of their chronic comorbidities is felt to place them at high risk for further clinical deterioration. Furthermore, it is not anticipated that the patient will be medically stable for discharge from the hospital within 2 midnights of admission. The following factors support the admission status of inpatient.     The patient's  presenting symptoms include back pain. The worrisome physical exam findings include back pain. The initial radiographic and laboratory data are worrisome because of T12 compression fractures. The chronic co-morbidities include CAD, chronic diastolic and systolic heart failure.       * I certify that at the point of admission it is my clinical judgment that the patient will require inpatient hospital care spanning beyond 2 midnights from the point of admission due to high intensity of service, high risk for further deterioration and high frequency of surveillance required.*  Time spent in minutes : 60 minutes   Stetson Pelaez S Mahlia Fernando M.D

## 2019-12-04 ENCOUNTER — Inpatient Hospital Stay (HOSPITAL_COMMUNITY): Payer: Medicare Other

## 2019-12-04 DIAGNOSIS — N183 Chronic kidney disease, stage 3 unspecified: Secondary | ICD-10-CM | POA: Diagnosis present

## 2019-12-04 LAB — COMPREHENSIVE METABOLIC PANEL
ALT: 32 U/L (ref 0–44)
AST: 41 U/L (ref 15–41)
Albumin: 3.4 g/dL — ABNORMAL LOW (ref 3.5–5.0)
Alkaline Phosphatase: 68 U/L (ref 38–126)
Anion gap: 12 (ref 5–15)
BUN: 25 mg/dL — ABNORMAL HIGH (ref 8–23)
CO2: 24 mmol/L (ref 22–32)
Calcium: 9.4 mg/dL (ref 8.9–10.3)
Chloride: 106 mmol/L (ref 98–111)
Creatinine, Ser: 1.17 mg/dL (ref 0.61–1.24)
GFR calc Af Amer: >60 mL/min (ref >60)
GFR calc non Af Amer: 56 mL/min — ABNORMAL LOW (ref >60)
Glucose, Bld: 89 mg/dL (ref 70–99)
Potassium: 3.8 mmol/L (ref 3.5–5.1)
Sodium: 142 mmol/L (ref 135–145)
Total Bilirubin: 3.5 mg/dL — ABNORMAL HIGH (ref 0.3–1.2)
Total Protein: 5.8 g/dL — ABNORMAL LOW (ref 6.5–8.1)

## 2019-12-04 LAB — CBC
HCT: 41.3 % (ref 39.0–52.0)
Hemoglobin: 14 g/dL (ref 13.0–17.0)
MCH: 32.5 pg (ref 26.0–34.0)
MCHC: 33.9 g/dL (ref 30.0–36.0)
MCV: 95.8 fL (ref 80.0–100.0)
Platelets: 89 10*3/uL — ABNORMAL LOW (ref 150–400)
RBC: 4.31 MIL/uL (ref 4.22–5.81)
RDW: 13.7 % (ref 11.5–15.5)
WBC: 5.6 10*3/uL (ref 4.0–10.5)
nRBC: 0 % (ref 0.0–0.2)

## 2019-12-04 MED ORDER — SODIUM CHLORIDE 0.9% FLUSH: 3.0000 mL | INTRAVENOUS | Status: DC | PRN

## 2019-12-04 MED ORDER — ONDANSETRON HCL 4 MG/2ML IJ SOLN: 4.0000 mg | Freq: Four times a day (QID) | INTRAMUSCULAR | Status: DC | PRN

## 2019-12-04 MED ORDER — METOPROLOL SUCCINATE ER 25 MG PO TB24
25.0000 mg | ORAL_TABLET | Freq: Two times a day (BID) | ORAL | Status: DC
  Administered 2019-12-04 – 2019-12-13 (×19): 25 mg via ORAL
  Filled 2019-12-04 (×21): qty 1

## 2019-12-04 MED ORDER — SODIUM CHLORIDE 0.9% FLUSH
3.0000 mL | Freq: Two times a day (BID) | INTRAVENOUS | Status: DC
  Administered 2019-12-04 – 2019-12-10 (×11): 3 mL via INTRAVENOUS

## 2019-12-04 MED ORDER — SACUBITRIL-VALSARTAN 24-26 MG PO TABS
1.0000 | ORAL_TABLET | Freq: Two times a day (BID) | ORAL | Status: DC
  Administered 2019-12-04 – 2019-12-05 (×3): 1 via ORAL
  Filled 2019-12-04 (×4): qty 1

## 2019-12-04 MED ORDER — TAMSULOSIN HCL 0.4 MG PO CAPS
0.4000 mg | ORAL_CAPSULE | Freq: Every day | ORAL | Status: DC
  Administered 2019-12-04 – 2019-12-13 (×10): 0.4 mg via ORAL
  Filled 2019-12-04 (×10): qty 1

## 2019-12-04 MED ORDER — METHOCARBAMOL 1000 MG/10ML IJ SOLN
500.0000 mg | Freq: Three times a day (TID) | INTRAVENOUS | Status: DC | PRN
  Filled 2019-12-04: qty 5

## 2019-12-04 MED ORDER — FLUDROCORTISONE ACETATE 0.1 MG PO TABS
100.0000 ug | ORAL_TABLET | Freq: Every day | ORAL | Status: DC
  Administered 2019-12-04 – 2019-12-11 (×8): 100 ug via ORAL
  Filled 2019-12-04 (×9): qty 1

## 2019-12-04 MED ORDER — OXYCODONE HCL 5 MG PO TABS
5.0000 mg | ORAL_TABLET | ORAL | Status: DC | PRN
  Administered 2019-12-04: 5 mg via ORAL
  Filled 2019-12-04 (×2): qty 1

## 2019-12-04 MED ORDER — OXYCODONE-ACETAMINOPHEN 5-325 MG PO TABS
1.0000 | ORAL_TABLET | Freq: Once | ORAL | Status: AC
  Administered 2019-12-04: 1 via ORAL
  Filled 2019-12-04: qty 1

## 2019-12-04 MED ORDER — LORAZEPAM 2 MG/ML IJ SOLN
0.5000 mg | Freq: Once | INTRAMUSCULAR | Status: AC
  Administered 2019-12-04: 0.5 mg via INTRAVENOUS
  Filled 2019-12-04: qty 1

## 2019-12-04 MED ORDER — ASPIRIN EC 81 MG PO TBEC
81.0000 mg | DELAYED_RELEASE_TABLET | Freq: Every day | ORAL | Status: DC
  Administered 2019-12-04 – 2019-12-13 (×10): 81 mg via ORAL
  Filled 2019-12-04 (×10): qty 1

## 2019-12-04 MED ORDER — SODIUM CHLORIDE 0.9 % IV SOLN: 250.0000 mL | INTRAVENOUS | Status: DC | PRN

## 2019-12-04 MED ORDER — OXYCODONE HCL 5 MG PO TABS: 5.0000 mg | ORAL_TABLET | Freq: Four times a day (QID) | ORAL | Status: DC | PRN

## 2019-12-04 MED ORDER — ONDANSETRON HCL 4 MG PO TABS: 4.0000 mg | ORAL_TABLET | Freq: Four times a day (QID) | ORAL | Status: DC | PRN

## 2019-12-04 MED ORDER — SPIRONOLACTONE 12.5 MG HALF TABLET
12.5000 mg | ORAL_TABLET | Freq: Every day | ORAL | Status: DC
  Administered 2019-12-04 – 2019-12-05 (×2): 12.5 mg via ORAL
  Filled 2019-12-04 (×2): qty 1

## 2019-12-04 MED ORDER — PANTOPRAZOLE SODIUM 40 MG PO TBEC
40.0000 mg | DELAYED_RELEASE_TABLET | Freq: Every day | ORAL | Status: DC
  Administered 2019-12-04 – 2019-12-13 (×10): 40 mg via ORAL
  Filled 2019-12-04 (×10): qty 1

## 2019-12-04 NOTE — Plan of Care (Signed)

## 2019-12-04 NOTE — Progress Notes (Signed)
Progress Note    John [name]  [id number] DOB: 09-Oct-1929  DOA: 12/03/2019 PCP: Annie Main, FNP    Brief Narrative:     Medical records reviewed and are as summarized below:  John [name] is a very pleasant, very HOH 86 y.o. 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  Assessment/Plan:   Principal Problem:   Compression fracture of T12 vertebra (HCC) Active Problems:   CAD in native artery   Essential hypertension, benign   CKD (chronic kidney disease), stage III   Chronic back pain  #1.  Compression fracture of T10 and T12.  Related to a fall.  Family reports multiple falls of late.  Recently given Flexeril for chronic neck pain and Bactrim for presumed UTI.  Granddaughter noted increase in falls as well as some hallucinations.  Awaiting MRI.  Remains in some pain.  To be evaluated by neurosurgery once MRI results -MRI -Pain management -Continue brace  #2.  CAD status post CABG.  No chest pain.  EKG with left bundle branch block similar to previous.  Home medications include aspirin Plavix Toprol. -Dose aspirin -Holding Plavix  #3.  Combined systolic diastolic heart failure/ischemic cardiomyopathy.  Does not appear volume overloaded at this time.  Echo done December 2020 reveals an EF of 25 to 30% with left ventricle moderately to severely decreased in function. Home medications include Entresto Toprol Lasix. -Holding Lasix for now -Continue Entresto and beta-blocker -Obtain daily weights -Monitor intake and output  #4.[name]  Hypertension.  Controlled.  Home medications include metoprolol, Aldactone and  lasix -holding lasix -continue BB and aldacton -Monitor  #5.Chronic kidney disease stage III.  Creatinine 1.17. -Monitor urine output -Hold nephrotoxins as able   Family Communication/Anticipated D/C date and plan/Code Status   DVT prophylaxis: scd ordered. Code Status: dnr.  Family Communication: granddaughter at bedside Disposition Plan: Status is: Inpatient  Remains inpatient appropriate because:Inpatient level of care appropriate due to severity of illness   Dispo: The patient is from: Home              Anticipated d/c is to: Home              Anticipated d/c date is: 2 days              Patient currently is not medically stable to d/c.          Medical Consultants:    Neurosurgery (confirmed with office)   Anti-Infectives:    None  Subjective:   Awake alert very hard of hearing complains of back pain  Objective:    Vitals:   12/04/19 0130 12/04/19 0200 12/04/19 0328 12/04/19 0717  BP: 121/71 126/79 126/76 129/67  Pulse: 90 87 77 78  Resp: 19 (!) 22 18 15   Temp:   97.7 F (36.5 C) 97.9 F (36.6 C)  TempSrc:   Oral Oral  SpO2: 98% 97% 95% 95%  Weight:      Height:        Intake/Output Summary (Last 24 hours) at 12/04/2019 1119 Last data filed at 12/04/2019 0900 Gross per 24 hour  Intake 120 ml  Output --  Net 120 ml   Danley Danker  Weights   12/03/19 1851  Weight: 77.1 kg    Exam: General: Somewhat pale well-nourished lying flat in bed no acute distress CV: Regular rate and rhythm no murmur gallop or rub no lower extremity edema Respiratory: No increased work of breathing breath sounds are distant but clear I hear no wheeze or crackles Abdomen: Nondistended soft positive bowel sounds throughout no guarding or rebounding nontender to palpation Musculoskeletal: Joints without swelling/erythema T SLO brace in place. Neuro: Very hard of hearing but oriented to self and place moving extremities spontaneously  Data Reviewed:   I have personally  reviewed following labs and imaging studies:  Labs: Labs show the following:   Basic Metabolic Panel: Recent Labs  Lab 12/03/19 Sep 28, 2020 12/04/19 0934  NA 139 142  K 3.5 3.8  CL 105 106  CO2 24 24  GLUCOSE 114* 89  BUN 28* 25*  CREATININE 1.05 1.17  CALCIUM 9.1 9.4  MG 2.0  --    GFR Estimated Creatinine Clearance: 47.4 mL/min (by C-G formula based on SCr of 1.17 mg/dL). Liver Function Tests: Recent Labs  Lab 12/03/19 09-28-2020 12/04/19 0934  AST 48* 41  ALT 33 32  ALKPHOS 73 68  BILITOT 3.7* 3.5*  PROT 6.2* 5.8*  ALBUMIN 3.5 3.4*   No results for input(s): LIPASE, AMYLASE in the last 168 hours. No results for input(s): AMMONIA in the last 168 hours. Coagulation profile No results for input(s): INR, PROTIME in the last 168 hours.  CBC: Recent Labs  Lab 12/03/19 09-28-20 12/04/19 0934  WBC 6.4 5.6  NEUTROABS 5.1  --   HGB 14.3 14.0  HCT 42.4 41.3  MCV 95.7 95.8  PLT 91* 89*   Cardiac Enzymes: Recent Labs  Lab 12/03/19 09-28-20  CKTOTAL 715*   BNP (last 3 results) No results for input(s): PROBNP in the last 8760 hours. CBG: No results for input(s): GLUCAP in the last 168 hours. D-Dimer: No results for input(s): DDIMER in the last 72 hours. Hgb A1c: No results for input(s): HGBA1C in the last 72 hours. Lipid Profile: No results for input(s): CHOL, HDL, LDLCALC, TRIG, CHOLHDL, LDLDIRECT in the last 72 hours. Thyroid function studies: Recent Labs    12/03/19 09/28/20  TSH 1.701   Anemia work up: No results for input(s): VITAMINB12, FOLATE, FERRITIN, TIBC, IRON, RETICCTPCT in the last 72 hours. Sepsis Labs: Recent Labs  Lab 12/03/19 September 28, 2020 12/04/19 0934  WBC 6.4 5.6    Microbiology Recent Results (from the past 240 hour(s))  SARS Coronavirus 2 by RT PCR (hospital order, performed in [facility] hospital lab) Nasopharyngeal Nasopharyngeal Swab     Status: None   Collection Time: 12/03/19 10:20 PM   Specimen: Nasopharyngeal Swab  Result Value Ref Range Status    SARS Coronavirus 2 NEGATIVE NEGATIVE Final    Comment: (NOTE) SARS-CoV-2 target nucleic acids are NOT DETECTED. The SARS-CoV-2 RNA is generally detectable in upper and lower respiratory specimens during the acute phase of infection. The lowest concentration of SARS-CoV-2 viral copies this assay can detect is 250 copies / mL. A negative result does not preclude SARS-CoV-2 infection and should not be used as the sole basis for treatment or other patient management decisions.  A negative result may occur with improper specimen collection / handling, submission of specimen other than nasopharyngeal swab, presence of viral mutation(s) within the areas targeted by this assay, and inadequate number of viral copies (<250 copies / mL). A negative result must be combined with clinical observations, patient history, and  epidemiological information. Fact Sheet for Patients:   StrictlyIdeas.no Fact Sheet for Healthcare Providers: BankingDealers.co.za This test is not yet approved or cleared  by the [place] FDA and has been authorized for detection and/or diagnosis of SARS-CoV-2 by FDA under an Emergency Use Authorization (EUA).  This EUA will remain in effect (meaning this test can be used) for the duration of the COVID-19 declaration under Section 564(b)(1) of the Act, 21 U.S.C. section 360bbb-3(b)(1), unless the authorization is terminated or revoked sooner. Performed at [facility], [address] [place], [place] [address]     Procedures and diagnostic studies:  CT Head Wo Contrast  Result Date: 12/03/2019 CLINICAL DATA:  Multiple recent falls with headaches and confusion, initial encounter EXAM: CT HEAD WITHOUT CONTRAST CT CERVICAL SPINE WITHOUT CONTRAST TECHNIQUE: Multidetector CT imaging of the head and cervical spine was performed following the standard protocol without intravenous contrast. Multiplanar CT image reconstructions of  the cervical spine were also generated. COMPARISON:  02/16/2019 FINDINGS: CT HEAD FINDINGS Brain: Chronic atrophic and ischemic changes are again noted and stable. No findings to suggest acute hemorrhage, acute infarction or space-occupying mass lesion are seen. Vascular: No hyperdense vessel or unexpected calcification. Skull: Normal. Negative for fracture or focal lesion. Sinuses/Orbits: No acute finding. Other: None. CT CERVICAL SPINE FINDINGS Alignment: Within normal limits. Skull base and vertebrae: 7 cervical segments are well visualized. Vertebral body height is well maintained. No acute fracture or acute facet abnormality is noted. Multilevel facet hypertrophic changes are seen. Disc space narrowing at C5-6 and C6-7 with associated osteophytes is noted. Soft tissues and spinal canal: Diffuse vascular calcifications are noted. No acute soft tissue abnormality is noted. Upper chest: Visualized lung apices are unremarkable. Other: None IMPRESSION: CT of the head: Chronic atrophic and ischemic changes without acute abnormality. CT of the cervical spine: Multilevel degenerative change without acute abnormality. Electronically Signed   By: Inez Catalina M.D.   On: 12/03/2019 21:06   CT Cervical Spine Wo Contrast  Result Date: 12/03/2019 CLINICAL DATA:  Multiple recent falls with headaches and confusion, initial encounter EXAM: CT HEAD WITHOUT CONTRAST CT CERVICAL SPINE WITHOUT CONTRAST TECHNIQUE: Multidetector CT imaging of the head and cervical spine was performed following the standard protocol without intravenous contrast. Multiplanar CT image reconstructions of the cervical spine were also generated. COMPARISON:  02/16/2019 FINDINGS: CT HEAD FINDINGS Brain: Chronic atrophic and ischemic changes are again noted and stable. No findings to suggest acute hemorrhage, acute infarction or space-occupying mass lesion are seen. Vascular: No hyperdense vessel or unexpected calcification. Skull: Normal. Negative for  fracture or focal lesion. Sinuses/Orbits: No acute finding. Other: None. CT CERVICAL SPINE FINDINGS Alignment: Within normal limits. Skull base and vertebrae: 7 cervical segments are well visualized. Vertebral body height is well maintained. No acute fracture or acute facet abnormality is noted. Multilevel facet hypertrophic changes are seen. Disc space narrowing at C5-6 and C6-7 with associated osteophytes is noted. Soft tissues and spinal canal: Diffuse vascular calcifications are noted. No acute soft tissue abnormality is noted. Upper chest: Visualized lung apices are unremarkable. Other: None IMPRESSION: CT of the head: Chronic atrophic and ischemic changes without acute abnormality. CT of the cervical spine: Multilevel degenerative change without acute abnormality. Electronically Signed   By: Inez Catalina M.D.   On: 12/03/2019 21:06   DG Chest Portable 1 View  Result Date: 12/03/2019 CLINICAL DATA:  86 year old male with altered mental status. EXAM: PORTABLE CHEST 1 VIEW COMPARISON:  Chest radiograph dated 09/11/2019. FINDINGS: Minimal left  lung base density, likely atelectasis. Residual or recurrent infiltrate is less likely but not excluded clinical correlation is recommended. No lobar consolidation, pleural effusion, pneumothorax. Mild cardiomegaly. Atherosclerotic calcification of the aorta. Median sternotomy wires and CABG vascular clips. No acute osseous pathology. Right upper quadrant cholecystectomy clips. IMPRESSION: 1. Probable atelectasis at the left lung base. 2. Mild cardiomegaly. Electronically Signed   By: Anner Crete M.D.   On: 12/03/2019 19:23   CT Renal Stone Study  Result Date: 12/03/2019 CLINICAL DATA:  86 year old male with left flank pain. Concern for kidney stone. EXAM: CT ABDOMEN AND PELVIS WITHOUT CONTRAST TECHNIQUE: Multidetector CT imaging of the abdomen and pelvis was performed following the standard protocol without IV contrast. COMPARISON:  None. FINDINGS: Evaluation  of this exam is limited in the absence of intravenous contrast. Lower chest: Left lung base subpleural density likely represents a small pleural effusion and associated left lung base atelectasis or infiltrate. Clinical correlation is recommended. Three vessel coronary vascular calcification. No intra-abdominal free air or free fluid. Hepatobiliary: The liver is unremarkable. No intrahepatic biliary ductal dilatation. Cholecystectomy. No retained calcified stone noted in the central CBD. Pancreas: Unremarkable. No pancreatic ductal dilatation or surrounding inflammatory changes. Spleen: Normal in size without focal abnormality. Adrenals/Urinary Tract: The adrenal glands are unremarkable. There is a 12 mm right renal upper pole nonobstructing stone. No hydronephrosis. Right renal interpolar hypodense lesion measures up to 4 cm. This is suboptimally characterized but demonstrates fluid attenuation, likely a cyst. There is no hydronephrosis or nephrolithiasis on the left. The visualized ureters and urinary bladder appear unremarkable. Stomach/Bowel: There is moderate stool throughout the colon. Several scattered sigmoid diverticula without active inflammatory changes. There is no bowel obstruction or active inflammation. The appendix is normal. Vascular/Lymphatic: Advanced aortoiliac atherosclerotic disease. The IVC is unremarkable. No portal venous gas. There is no adenopathy. Reproductive: The prostate and seminal vesicles are grossly unremarkable. No pelvic masses Other: None Musculoskeletal: Osteopenia with scoliosis and degenerative changes of the spine. There is compression fracture of the T12 vertebra with near complete loss of vertebral body height, age indeterminate but likely acute or subacute. There is approximately 9 mm retropulsion of the posterior vertebral body. There is also compression fracture of the superior endplate of L1 with approximately 50% loss of vertebral body height. There is mild  perivertebral hematoma at T12 with extension along the left pleural space. There is moderate to severe narrowing of the central canal at T12 due to retropulsion. Correlation with neurological exam recommended. If there is neurological deficit or concern for cord compression, further evaluation with MRI is advised. There is nondisplaced fracture of the left L1 transverse process. IMPRESSION: 1. Acute appearing compression fractures of the T12 with near complete loss of vertebral body height and 9 mm retropulsion of the posterior vertebral body. There is moderate to severe narrowing of the central canal at T12 due to retropulsion. Correlation with neurological exam recommended. If there is neurological deficit or concern for cord compression, further evaluation with MRI is advised. 2. Acute appearing compression fracture of superior endplate of L1 with approximately 50% loss of vertebral body height. 3. Small left pleural effusion or hemorrhagic fluid related to vertebral fracture. 4. A 12 mm nonobstructing right renal upper pole stone. No hydronephrosis. 5. Moderate colonic stool burden. No bowel obstruction. Normal appendix. 6. Aortic Atherosclerosis (ICD10-I70.0). Electronically Signed   By: Anner Crete M.D.   On: 12/03/2019 21:16    Medications:   . aspirin EC  81 mg Oral Daily  .  fludrocortisone  100 mcg Oral Daily  . LORazepam  0.5 mg Intravenous Once  . metoprolol succinate  25 mg Oral BID  . pantoprazole  40 mg Oral Daily  . sacubitril-valsartan  1 tablet Oral BID  . sodium chloride flush  3 mL Intravenous Q12H  . spironolactone  12.5 mg Oral Daily  . tamsulosin  0.4 mg Oral Daily   Continuous Infusions: . sodium chloride    . methocarbamol (ROBAXIN) IV       LOS: 1 day   Radene Gunning NP [facility]   How to contact the [facility] Attending or Consulting provider [place] or covering provider during after hours [place], for this patient?  1. Check the care team in [facility] and look  for a) attending/consulting [facility] provider listed and b) the [facility] team listed 2. Log into [email or web address] and use [place]'s universal password to access. If you do not have the password, please contact the hospital operator. 3. Locate the [facility] provider you are looking for under [facility] and page to a number that you can be directly reached. 4. If you still have difficulty reaching the provider, please page the [facility] (Director on Call) for the Hospitalists listed on amion for assistance.  12/04/2019, 11:19 AM

## 2019-12-04 NOTE — ED Notes (Signed)
Pt's granddaughter  Sharyn Lull [facility]) notified of pt's impending transport to [facility].

## 2019-12-05 DIAGNOSIS — G9340 Encephalopathy, unspecified: Secondary | ICD-10-CM | POA: Diagnosis present

## 2019-12-05 LAB — URINALYSIS, ROUTINE W REFLEX MICROSCOPIC
Bacteria, UA: NONE SEEN
Bilirubin Urine: NEGATIVE
Glucose, UA: NEGATIVE mg/dL
Ketones, ur: 20 mg/dL — AB
Leukocytes,Ua: NEGATIVE
Nitrite: NEGATIVE
Protein, ur: NEGATIVE mg/dL
Specific Gravity, Urine: 1.019 (ref 1.005–1.030)
pH: 5 (ref 5.0–8.0)

## 2019-12-05 LAB — BASIC METABOLIC PANEL
Anion gap: 10 (ref 5–15)
BUN: 23 mg/dL (ref 8–23)
CO2: 24 mmol/L (ref 22–32)
Calcium: 9 mg/dL (ref 8.9–10.3)
Chloride: 106 mmol/L (ref 98–111)
Creatinine, Ser: 1.1 mg/dL (ref 0.61–1.24)
GFR calc Af Amer: >60 mL/min (ref >60)
GFR calc non Af Amer: >60 mL/min (ref >60)
Glucose, Bld: 81 mg/dL (ref 70–99)
Potassium: 3.4 mmol/L — ABNORMAL LOW (ref 3.5–5.1)
Sodium: 140 mmol/L (ref 135–145)

## 2019-12-05 MED ORDER — SODIUM CHLORIDE 0.9 % IV SOLN: INTRAVENOUS | Status: DC

## 2019-12-05 MED ORDER — ACETAMINOPHEN 500 MG PO TABS
1000.0000 mg | ORAL_TABLET | Freq: Three times a day (TID) | ORAL | Status: DC
  Administered 2019-12-05 – 2019-12-13 (×24): 1000 mg via ORAL
  Filled 2019-12-05 (×24): qty 2

## 2019-12-05 MED ORDER — HALOPERIDOL LACTATE 5 MG/ML IJ SOLN
0.5000 mg | Freq: Once | INTRAMUSCULAR | Status: AC
  Administered 2019-12-05: 0.5 mg via INTRAVENOUS
  Filled 2019-12-05: qty 1

## 2019-12-05 NOTE — Progress Notes (Signed)
Telesitter brought to patient's room and set up.  Family aware one may stay if they are able due to change in MS.

## 2019-12-05 NOTE — Plan of Care (Signed)
  Problem: Education: Goal: Ability to verbalize activity precautions or restrictions will improve Outcome: Progressing   Problem: Activity: Goal: Ability to avoid complications of mobility impairment will improve Outcome: Progressing   Problem: Pain Management: Goal: Pain level will decrease Outcome: Progressing   Problem: Skin Integrity: Goal: Will show signs of wound healing Outcome: Progressing   

## 2019-12-05 NOTE — Progress Notes (Addendum)
Progress Note    John [name]  [id number] DOB: Apr 26, 1930  DOA: 12/03/2019 PCP: Annie Main, FNP    Brief Narrative:    Medical records reviewed and are as summarized below:  John [name] is an 86 y.o. male very HOH  with a past medical history that includes CAD status post CABG, STEMI in 2017, ckdIII chronic combined systolic diastolic heart failure, ischemic cardiomyopathy with an EF of 30%, hypertension, hyperlipidemia, chronic thrombocytopenia, chronic back pain, CVID +09/2019, transferred to [facility] June 1 from [facility] as recommended by neurosurgery for compression fracture of T10 and T12.  MRI obtained late June 1.  Continue to await neurosurgery recommendations.  Have called the office again this morning to confirm patient is on consult list.  Assessment/Plan:   Principal Problem:   Compression fracture of T12 vertebra (HCC) Active Problems:   CAD in native artery   Essential hypertension, benign   CKD (chronic kidney disease), stage III   Acute encephalopathy   Chronic back pain  #1.  Compression fracture of T10 and T12.  Related to a fall.  Seems to be in a little bit less pain today.  MRI reveals recent compression fracture at T12 with near complete loss of height centrally, retropulsed 9 mm encroaches upon the spinal canal effacing the subarachnoid space at that level but without definite cord compression/deformity.  I have confirmed with neurosurgery office today that patient will be seen. -Pain management -Continue brace  #2.  CAD status post CABG.  No chest pain.  EKG with left bundle branch block similar to previous.  Home medications include aspirin Plavix Toprol. -Dose aspirin -Holding Plavix  #3.  Combined systolic diastolic heart failure/ischemic cardiomyopathy.  Does not appear volume overloaded at this time.  Echo done December 2020 reveals an EF of 25 to 30% with left ventricle moderately to severely decreased in function. Home  medications include Entresto Toprol Lasix.  Patient is not taking much by mouth. -Holding Lasix for now -Continue Entresto and beta-blocker -We will start gentle IV fluids for hydration maintenance -Obtain daily weights -Monitor intake and output  #4.[name]  Hypertension.  Fair control. Home medications include metoprolol, Aldactone and lasix -holding lasix -continue BB and aldactone -Monitor  #5.Chronic kidney disease stage III.  Creatinine 1.17 on admission.  -Monitor urine output -Hold nephrotoxins as able  #6.  Acute encephalopathy.  Raquel Sarna reports patient's baseline is mild to moderate dementia.  Has had episodes of confusion at home that seem to start with urinary tract infection and subsequent treatment.  He remains pleasantly confused and quite hard of hearing.  Likely related to medications, pain, hospitalization.  Neuro exam benign.  He is afebrile hemodynamically stable and nontoxic-appearing.  Of note he got a dose of Ativan yesterday for MRI and patient has a history of worsening confusion with this medication. -Gentle IV fluids -Minimize altering medications as much as possible -Follow urine culture     Family Communication/Anticipated D/C date and plan/Code Status   DVT prophylaxis: scd ordered. Code Status: dnr.  Family Communication: Granddaughter at bedside Disposition Plan: Status is: Inpatient  Remains inpatient appropriate because:IV treatments appropriate due to intensity of illness or inability to take PO   Dispo: The patient is from: Home              Anticipated d/c is to: SNF              Anticipated d/c date is: 1 day  Patient currently is not medically stable to d/c.          Medical Consultants:    neurosurgery   Anti-Infectives:     Subjective:   Patient sitting up slightly in bed.  Pleasantly confused with mitts on.  Quite hard of hearing  Objective:    Vitals:   12/04/19 0717 12/04/19 1925 12/05/19 0423 12/05/19  0801  BP: 129/67 114/68 (!) 144/81 110/66  Pulse: 78 86 80 90  Resp: 15 14 20 18   Temp: 97.9 F (36.6 C) 98 F (36.7 C) 98.4 F (36.9 C) 97.9 F (36.6 C)  TempSrc: Oral Oral Oral Oral  SpO2: 95% 96% 99% 100%  Weight:      Height:        Intake/Output Summary (Last 24 hours) at 12/05/2019 0920 Last data filed at 12/05/2019 0300 Gross per 24 hour  Intake 240 ml  Output 300 ml  Net -60 ml   Filed Weights   12/03/19 1851  Weight: 77.1 kg    Exam: General: Somewhat pale well-nourished head of bed is slightly elevated and patient is smiling.  No acute distress  CV: Regular rate and rhythm no murmur gallop or rub no lower extremity edema Respiratory: No increased work of breathing breath sounds are distant but clear I hear no wheeze or crackles Abdomen: Nondistended soft positive bowel sounds throughout no guarding or rebounding nontender to palpation Musculoskeletal: Joints without swelling/erythema T SLO brace in place. Neuro: Very hard of hearing but oriented to self and place moving extremities spontaneously  Data Reviewed:   I have personally reviewed following labs and imaging studies:  Labs: Labs show the following:   Basic Metabolic Panel: Recent Labs  Lab 12/03/19 10/17/20 12/04/19 0934  NA 139 142  K 3.5 3.8  CL 105 106  CO2 24 24  GLUCOSE 114* 89  BUN 28* 25*  CREATININE 1.05 1.17  CALCIUM 9.1 9.4  MG 2.0  --    GFR Estimated Creatinine Clearance: 47.4 mL/min (by C-G formula based on SCr of 1.17 mg/dL). Liver Function Tests: Recent Labs  Lab 12/03/19 10/17/20 12/04/19 0934  AST 48* 41  ALT 33 32  ALKPHOS 73 68  BILITOT 3.7* 3.5*  PROT 6.2* 5.8*  ALBUMIN 3.5 3.4*   No results for input(s): LIPASE, AMYLASE in the last 168 hours. No results for input(s): AMMONIA in the last 168 hours. Coagulation profile No results for input(s): INR, PROTIME in the last 168 hours.  CBC: Recent Labs  Lab 12/03/19 10/17/20 12/04/19 0934  WBC 6.4 5.6  NEUTROABS 5.1  --    HGB 14.3 14.0  HCT 42.4 41.3  MCV 95.7 95.8  PLT 91* 89*   Cardiac Enzymes: Recent Labs  Lab 12/03/19 10-17-20  CKTOTAL 715*   BNP (last 3 results) No results for input(s): PROBNP in the last 8760 hours. CBG: No results for input(s): GLUCAP in the last 168 hours. D-Dimer: No results for input(s): DDIMER in the last 72 hours. Hgb A1c: No results for input(s): HGBA1C in the last 72 hours. Lipid Profile: No results for input(s): CHOL, HDL, LDLCALC, TRIG, CHOLHDL, LDLDIRECT in the last 72 hours. Thyroid function studies: Recent Labs    12/03/19 Oct 17, 2020  TSH 1.701   Anemia work up: No results for input(s): VITAMINB12, FOLATE, FERRITIN, TIBC, IRON, RETICCTPCT in the last 72 hours. Sepsis Labs: Recent Labs  Lab 12/03/19 Oct 17, 2020 12/04/19 0934  WBC 6.4 5.6    Microbiology Recent Results (from the past 240 hour(s))  SARS Coronavirus 2 by RT PCR (hospital order, performed in [facility] hospital lab) Nasopharyngeal Nasopharyngeal Swab     Status: None   Collection Time: 12/03/19 10:20 PM   Specimen: Nasopharyngeal Swab  Result Value Ref Range Status   SARS Coronavirus 2 NEGATIVE NEGATIVE Final    Comment: (NOTE) SARS-CoV-2 target nucleic acids are NOT DETECTED. The SARS-CoV-2 RNA is generally detectable in upper and lower respiratory specimens during the acute phase of infection. The lowest concentration of SARS-CoV-2 viral copies this assay can detect is 250 copies / mL. A negative result does not preclude SARS-CoV-2 infection and should not be used as the sole basis for treatment or other patient management decisions.  A negative result may occur with improper specimen collection / handling, submission of specimen other than nasopharyngeal swab, presence of viral mutation(s) within the areas targeted by this assay, and inadequate number of viral copies (<250 copies / mL). A negative result must be combined with clinical observations, patient history, and epidemiological  information. Fact Sheet for Patients:   StrictlyIdeas.no Fact Sheet for Healthcare Providers: BankingDealers.co.za This test is not yet approved or cleared  by the [place] FDA and has been authorized for detection and/or diagnosis of SARS-CoV-2 by FDA under an Emergency Use Authorization (EUA).  This EUA will remain in effect (meaning this test can be used) for the duration of the COVID-19 declaration under Section 564(b)(1) of the Act, 21 U.S.C. section 360bbb-3(b)(1), unless the authorization is terminated or revoked sooner. Performed at [facility], [address], [place], [place] [address]     Procedures and diagnostic studies:  CT Head Wo Contrast  Result Date: 12/03/2019 CLINICAL DATA:  Multiple recent falls with headaches and confusion, initial encounter EXAM: CT HEAD WITHOUT CONTRAST CT CERVICAL SPINE WITHOUT CONTRAST TECHNIQUE: Multidetector CT imaging of the head and cervical spine was performed following the standard protocol without intravenous contrast. Multiplanar CT image reconstructions of the cervical spine were also generated. COMPARISON:  02/16/2019 FINDINGS: CT HEAD FINDINGS Brain: Chronic atrophic and ischemic changes are again noted and stable. No findings to suggest acute hemorrhage, acute infarction or space-occupying mass lesion are seen. Vascular: No hyperdense vessel or unexpected calcification. Skull: Normal. Negative for fracture or focal lesion. Sinuses/Orbits: No acute finding. Other: None. CT CERVICAL SPINE FINDINGS Alignment: Within normal limits. Skull base and vertebrae: 7 cervical segments are well visualized. Vertebral body height is well maintained. No acute fracture or acute facet abnormality is noted. Multilevel facet hypertrophic changes are seen. Disc space narrowing at C5-6 and C6-7 with associated osteophytes is noted. Soft tissues and spinal canal: Diffuse vascular calcifications are noted. No acute  soft tissue abnormality is noted. Upper chest: Visualized lung apices are unremarkable. Other: None IMPRESSION: CT of the head: Chronic atrophic and ischemic changes without acute abnormality. CT of the cervical spine: Multilevel degenerative change without acute abnormality. Electronically Signed   By: Inez Catalina M.D.   On: 12/03/2019 21:06   CT Cervical Spine Wo Contrast  Result Date: 12/03/2019 CLINICAL DATA:  Multiple recent falls with headaches and confusion, initial encounter EXAM: CT HEAD WITHOUT CONTRAST CT CERVICAL SPINE WITHOUT CONTRAST TECHNIQUE: Multidetector CT imaging of the head and cervical spine was performed following the standard protocol without intravenous contrast. Multiplanar CT image reconstructions of the cervical spine were also generated. COMPARISON:  02/16/2019 FINDINGS: CT HEAD FINDINGS Brain: Chronic atrophic and ischemic changes are again noted and stable. No findings to suggest acute hemorrhage, acute infarction or space-occupying mass lesion are  seen. Vascular: No hyperdense vessel or unexpected calcification. Skull: Normal. Negative for fracture or focal lesion. Sinuses/Orbits: No acute finding. Other: None. CT CERVICAL SPINE FINDINGS Alignment: Within normal limits. Skull base and vertebrae: 7 cervical segments are well visualized. Vertebral body height is well maintained. No acute fracture or acute facet abnormality is noted. Multilevel facet hypertrophic changes are seen. Disc space narrowing at C5-6 and C6-7 with associated osteophytes is noted. Soft tissues and spinal canal: Diffuse vascular calcifications are noted. No acute soft tissue abnormality is noted. Upper chest: Visualized lung apices are unremarkable. Other: None IMPRESSION: CT of the head: Chronic atrophic and ischemic changes without acute abnormality. CT of the cervical spine: Multilevel degenerative change without acute abnormality. Electronically Signed   By: Inez Catalina M.D.   On: 12/03/2019 21:06   MR  THORACIC SPINE WO CONTRAST  Result Date: 12/04/2019 CLINICAL DATA:  Mid back pain. Suspected compression fracture. Question cord compression. EXAM: MRI THORACIC SPINE WITHOUT CONTRAST TECHNIQUE: Multiplanar, multisequence MR imaging of the thoracic spine was performed. No intravenous contrast was administered. COMPARISON:  Chest radiography 12/03/2019.  Abdominal CT 12/03/2019 FINDINGS: Alignment: Thoracic curvature convex to the right with the apex at T8-9. Vertebrae: Old healed minor compression deformity at T2 with loss of height of only 10-20%. Recent compression fracture at T12 with near complete loss of height centrally. Retropulsion of 9 mm, encroaching upon the spinal canal. Effacement of the subarachnoid space without frank compression of the cord being demonstrated. Recent superior endplate fracture at L1 with loss of height of 30%. No retropulsed bone at that level. Cord: No primary cord lesion. As noted above, at the T12 level, despite 9 mm of retropulsion, cord itself does not appear grossly compressed, though the subarachnoid space surrounding it is effaced. Paraspinal and other soft tissues: Otherwise negative Disc levels: No significant disc space pathology in the thoracic region. Ordinary age related desiccation and loss of height. IMPRESSION: Recent compression fracture at T12 with near complete loss of height centrally. Retropulsed 9 mm encroaches upon the spinal canal, effacing the subarachnoid space at that level, but without definite cord compression/deformity. Recent superior endplate fracture at L1 with loss of height of 30%. No retropulsed bone at that level. Electronically Signed   By: Nelson Chimes M.D.   On: 12/04/2019 16:11   DG Chest Portable 1 View  Result Date: 12/03/2019 CLINICAL DATA:  86 year old male with altered mental status. EXAM: PORTABLE CHEST 1 VIEW COMPARISON:  Chest radiograph dated 09/11/2019. FINDINGS: Minimal left lung base density, likely atelectasis. Residual or  recurrent infiltrate is less likely but not excluded clinical correlation is recommended. No lobar consolidation, pleural effusion, pneumothorax. Mild cardiomegaly. Atherosclerotic calcification of the aorta. Median sternotomy wires and CABG vascular clips. No acute osseous pathology. Right upper quadrant cholecystectomy clips. IMPRESSION: 1. Probable atelectasis at the left lung base. 2. Mild cardiomegaly. Electronically Signed   By: Anner Crete M.D.   On: 12/03/2019 19:23   CT Renal Stone Study  Result Date: 12/03/2019 CLINICAL DATA:  86 year old male with left flank pain. Concern for kidney stone. EXAM: CT ABDOMEN AND PELVIS WITHOUT CONTRAST TECHNIQUE: Multidetector CT imaging of the abdomen and pelvis was performed following the standard protocol without IV contrast. COMPARISON:  None. FINDINGS: Evaluation of this exam is limited in the absence of intravenous contrast. Lower chest: Left lung base subpleural density likely represents a small pleural effusion and associated left lung base atelectasis or infiltrate. Clinical correlation is recommended. Three vessel coronary vascular calcification. No intra-abdominal  free air or free fluid. Hepatobiliary: The liver is unremarkable. No intrahepatic biliary ductal dilatation. Cholecystectomy. No retained calcified stone noted in the central CBD. Pancreas: Unremarkable. No pancreatic ductal dilatation or surrounding inflammatory changes. Spleen: Normal in size without focal abnormality. Adrenals/Urinary Tract: The adrenal glands are unremarkable. There is a 12 mm right renal upper pole nonobstructing stone. No hydronephrosis. Right renal interpolar hypodense lesion measures up to 4 cm. This is suboptimally characterized but demonstrates fluid attenuation, likely a cyst. There is no hydronephrosis or nephrolithiasis on the left. The visualized ureters and urinary bladder appear unremarkable. Stomach/Bowel: There is moderate stool throughout the colon. Several  scattered sigmoid diverticula without active inflammatory changes. There is no bowel obstruction or active inflammation. The appendix is normal. Vascular/Lymphatic: Advanced aortoiliac atherosclerotic disease. The IVC is unremarkable. No portal venous gas. There is no adenopathy. Reproductive: The prostate and seminal vesicles are grossly unremarkable. No pelvic masses Other: None Musculoskeletal: Osteopenia with scoliosis and degenerative changes of the spine. There is compression fracture of the T12 vertebra with near complete loss of vertebral body height, age indeterminate but likely acute or subacute. There is approximately 9 mm retropulsion of the posterior vertebral body. There is also compression fracture of the superior endplate of L1 with approximately 50% loss of vertebral body height. There is mild perivertebral hematoma at T12 with extension along the left pleural space. There is moderate to severe narrowing of the central canal at T12 due to retropulsion. Correlation with neurological exam recommended. If there is neurological deficit or concern for cord compression, further evaluation with MRI is advised. There is nondisplaced fracture of the left L1 transverse process. IMPRESSION: 1. Acute appearing compression fractures of the T12 with near complete loss of vertebral body height and 9 mm retropulsion of the posterior vertebral body. There is moderate to severe narrowing of the central canal at T12 due to retropulsion. Correlation with neurological exam recommended. If there is neurological deficit or concern for cord compression, further evaluation with MRI is advised. 2. Acute appearing compression fracture of superior endplate of L1 with approximately 50% loss of vertebral body height. 3. Small left pleural effusion or hemorrhagic fluid related to vertebral fracture. 4. A 12 mm nonobstructing right renal upper pole stone. No hydronephrosis. 5. Moderate colonic stool burden. No bowel obstruction.  Normal appendix. 6. Aortic Atherosclerosis (ICD10-I70.0). Electronically Signed   By: Anner Crete M.D.   On: 12/03/2019 21:16    Medications:   . aspirin EC  81 mg Oral Daily  . fludrocortisone  100 mcg Oral Daily  . metoprolol succinate  25 mg Oral BID  . pantoprazole  40 mg Oral Daily  . sacubitril-valsartan  1 tablet Oral BID  . sodium chloride flush  3 mL Intravenous Q12H  . spironolactone  12.5 mg Oral Daily  . tamsulosin  0.4 mg Oral Daily   Continuous Infusions: . sodium chloride    . sodium chloride    . methocarbamol (ROBAXIN) IV       LOS: 2 days   Radene Gunning NP [facility]   How to contact the [facility] Attending or Consulting provider [place] or covering provider during after hours [place], for this patient?  1. Check the care team in [facility] and look for a) attending/consulting [facility] provider listed and b) the [facility] team listed 2. Log into [email or web address] and use [facility]'s universal password to access. If you do not have the password, please contact the hospital operator. 3. Locate the [facility]  provider you are looking for under [facility] and page to a number that you can be directly reached. 4. If you still have difficulty reaching the provider, please page the [facility] (Director on Call) for the Hospitalists listed on amion for assistance.  12/05/2019, 9:20 AM

## 2019-12-05 NOTE — Progress Notes (Signed)
Orthopedic Tech Progress Note Patient Details:  CRYSTAL GENDREAU [birth year] [id number] Called in order to [facility] for a TLSO VERTALIGN BACK BRACE Patient ID: ROBBE MCCASTER, male   DOB: 01-11-1930, 86 y.o.   MRN: [id number]   Janit Pagan 12/05/2019, 12:18 PM

## 2019-12-05 NOTE — Plan of Care (Signed)

## 2019-12-05 NOTE — Consult Note (Signed)
Reason for Consult:T12 compression fracture with retropulsion and spinal stenosis  Referring Physician: Dr. Laverta Baltimore   HPI: John [name] is an 86 year old-male who has a PMHx significant for CAD s/p CABG in 1022, STEMI in 11/2015 with DES to LIMA-LAD insertion site, chronic combined systolic and diastolic CHF, ischemic cardiomyopathy, EF 40 to 45% in 11/2015, hypertension, hyperlipidemia, OSA, and chronic thrombocytopenia. As per the granddaughter, the patient has been having frequent falls since March 2021. He has progressively been falling more frequently and about two weeks ago, began to "start leaning to the left and walking hunched over" as well as progressively worsening low back pain. The pain is focused on the left and radiates into his left flank, hip and left upper leg. The pain is reported to be a burning pain "like fire". 9/10 at its worst. His pain is worsened with standing and changes in position while sitting or lying. His daughter reported that he also has begun to drag his left leg while ambulating and appears very unstable. He was recently seen by his PCP and he was prescribed Flexeril for left neck pain and also was started on Bactrim for questionable UTI.  Patient began having hallucinations that were presumably from the Flexeril. The patient presented to [facility] ED on the evening of 12/03/2019 for frequent falls and hallucinations and transferred to [facility] for neurosurgical evaluation. The history is gathered from the patient, and granddaughter and daughter who are at bedside. The granddaughter reports she is the patient's HCPOA. He denies numbness, tingling, weakness, and neck and arm pain.  Past Medical History:  Diagnosis Date  . Acute ST elevation myocardial infarction (STEMI) of posterior wall ([place]) 11/11/2015  . CKD (chronic kidney disease), stage III   . Coronary atherosclerosis of native coronary artery    a. Multivessel status post CABG in 1992. b. STEMI 11/2015 s/p DES to  insertion site of LIMA-LAD.  [name] Dilated aortic root ([place])   . Essential hypertension   . Ischemic cardiomyopathy   . LVEF <40% 11/28/2019  . Malignant melanoma of skin   . Mixed hyperlipidemia   . Nephrolithiasis   . OSA (obstructive sleep apnea)   . Pneumonia due to COVID-19 virus    March 2021  . Rosacea   . Thrombocytopenia ([place])     Past Surgical History:  Procedure Laterality Date  . CARDIAC CATHETERIZATION  07/11/90  . CARDIAC CATHETERIZATION N/A 11/11/2015   Procedure: Left Heart Cath and Coronary Angiography;  Surgeon: [name], MD;  Location: [place] [facility];  Service: Cardiovascular;  Laterality: N/A;  . CARDIAC CATHETERIZATION N/A 11/14/2015   Procedure: Coronary Stent Intervention w/Impella;  Surgeon: [name], MD;  Location: [place] [facility];  Service: Cardiovascular;  Laterality: N/A;  . carotid duplex  06/24/2011, 05/12/2009   Right and Left ICAs: Demonstrates a small amount of irregular mixed density plaque with no evidence of diameter reduction, significant tortuosity or any other vascular abnormality. This is a mildly abnormal carotid duplex doppler evaluation.  [name] CATARACT EXTRACTION W/PHACO Right 02/11/2014   Procedure: CATARACT EXTRACTION PHACO AND INTRAOCULAR LENS PLACEMENT (IOC);  Surgeon: [name], MD;  Location: [facility];  Service: Ophthalmology;  Laterality: Right;  CDE 13.83  . CHOLECYSTECTOMY    . CORONARY ARTERY BYPASS GRAFT  1992  . DOPPLER ECHOCARDIOGRAPHY  05/23/2007   Left ventricular systolic function is normal. The transmitral spectral doppler flow pattern is suggestive of impaired LV relaxation. No significant valvular abnormalities.  . heart monitor  04/01/2009  palpitations  . HERNIA REPAIR    . MELANOMA EXCISION     neck  . NM MYOCAR PERF EJECTION FRACTION  02/10/2011   The post stress myocaardial perfusion images show a normal pattern of perfusion in all regions. No significant wall motion abnormalities noted. EKG is negative for  ischemia. This is a low risk scan.  [name] TOTAL KNEE ARTHROPLASTY      Family History  Problem Relation Age of Onset  . Heart attack Father   . Skin cancer Brother   . Heart attack Brother     Social History:  reports that he quit smoking about 38 years ago. His smoking use included cigarettes. He started smoking about 76 years ago. He has never used smokeless tobacco. He reports that he does not drink alcohol or use drugs.  Allergies:  Allergies  Allergen Reactions  . Statins Other (See Comments)    Weak,nervous  . Xanax [Alprazolam] Other (See Comments)    Hallucination  . Tape Rash    Medications: I have reviewed the patient's current medications.  Results for orders placed or performed during the hospital encounter of 12/03/19 (from the past 48 hour(s))  Comprehensive metabolic panel     Status: Abnormal   Collection Time: 12/03/19  8:22 PM  Result Value Ref Range   Sodium 139 135 - 145 mmol/L   Potassium 3.5 3.5 - 5.1 mmol/L   Chloride 105 98 - 111 mmol/L   CO2 24 22 - 32 mmol/L   Glucose, Bld 114 (H) 70 - 99 mg/dL    Comment: Glucose reference range applies only to samples taken after fasting for at least 8 hours.   BUN 28 (H) 8 - 23 mg/dL   Creatinine, Ser 1.05 0.61 - 1.24 mg/dL   Calcium 9.1 8.9 - 10.3 mg/dL   Total Protein 6.2 (L) 6.5 - 8.1 g/dL   Albumin 3.5 3.5 - 5.0 g/dL   AST 48 (H) 15 - 41 U/L   ALT 33 0 - 44 U/L   Alkaline Phosphatase 73 38 - 126 U/L   Total Bilirubin 3.7 (H) 0.3 - 1.2 mg/dL   GFR calc non Af Amer >60 >60 mL/min   GFR calc Af Amer >60 >60 mL/min   Anion gap 10 5 - 15    Comment: Performed at [facility], [address] [place], [place] [address]  CBC with Differential     Status: Abnormal   Collection Time: 12/03/19  8:22 PM  Result Value Ref Range   WBC 6.4 4.0 - 10.5 K/uL   RBC 4.43 4.22 - 5.81 MIL/uL   Hemoglobin 14.3 13.0 - 17.0 g/dL   HCT 42.4 39.0 - 52.0 %   MCV 95.7 80.0 - 100.0 fL   MCH 32.3 26.0 - 34.0 pg   MCHC 33.7 30.0  - 36.0 g/dL   RDW 13.7 11.5 - 15.5 %   Platelets 91 (L) 150 - 400 K/uL    Comment: SPECIMEN CHECKED FOR CLOTS PLATELET COUNT CONFIRMED BY SMEAR    nRBC 0.0 0.0 - 0.2 %   Neutrophils Relative % 79 %   Neutro Abs 5.1 1.7 - 7.7 K/uL   Lymphocytes Relative 14 %   Lymphs Abs 0.9 0.7 - 4.0 K/uL   Monocytes Relative 6 %   Monocytes Absolute 0.4 0.1 - 1.0 K/uL   Eosinophils Relative 1 %   Eosinophils Absolute 0.0 0.0 - 0.5 K/uL   Basophils Relative 0 %   Basophils Absolute 0.0 0.0 -  0.1 K/uL   Immature Granulocytes 0 %   Abs Immature Granulocytes 0.02 0.00 - 0.07 K/uL    Comment: Performed at [facility], [address] [place], [place] [address]  Magnesium     Status: None   Collection Time: 12/03/19  8:22 PM  Result Value Ref Range   Magnesium 2.0 1.7 - 2.4 mg/dL    Comment: Performed at [facility], [address], [place], [place] [address]  TSH     Status: None   Collection Time: 12/03/19  8:22 PM  Result Value Ref Range   TSH 1.701 0.350 - 4.500 uIU/mL    Comment: Performed by a 3rd Generation assay with a functional sensitivity of <=0.01 uIU/mL. Performed at [facility], [address], [place], [place] [address]   CK     Status: Abnormal   Collection Time: 12/03/19  8:22 PM  Result Value Ref Range   Total CK 715 (H) 49 - 397 U/L    Comment: Performed at [facility], [address] [place], [place] [address]  Brain natriuretic peptide     Status: Abnormal   Collection Time: 12/03/19  8:22 PM  Result Value Ref Range   B Natriuretic Peptide 712.0 (H) 0.0 - 100.0 pg/mL    Comment: Performed at [facility], [address], [place], [place] [address]  SARS Coronavirus 2 by RT PCR (hospital order, performed in [facility] hospital lab) Nasopharyngeal Nasopharyngeal Swab     Status: None   Collection Time: 12/03/19 10:20 PM   Specimen: Nasopharyngeal Swab  Result Value Ref Range   SARS Coronavirus 2 NEGATIVE NEGATIVE    Comment: (NOTE) SARS-CoV-2 target nucleic acids are NOT  DETECTED. The SARS-CoV-2 RNA is generally detectable in upper and lower respiratory specimens during the acute phase of infection. The lowest concentration of SARS-CoV-2 viral copies this assay can detect is 250 copies / mL. A negative result does not preclude SARS-CoV-2 infection and should not be used as the sole basis for treatment or other patient management decisions.  A negative result may occur with improper specimen collection / handling, submission of specimen other than nasopharyngeal swab, presence of viral mutation(s) within the areas targeted by this assay, and inadequate number of viral copies (<250 copies / mL). A negative result must be combined with clinical observations, patient history, and epidemiological information. Fact Sheet for Patients:   StrictlyIdeas.no Fact Sheet for Healthcare Providers: BankingDealers.co.za This test is not yet approved or cleared  by the [place] FDA and has been authorized for detection and/or diagnosis of SARS-CoV-2 by FDA under an Emergency Use Authorization (EUA).  This EUA will remain in effect (meaning this test can be used) for the duration of the COVID-19 declaration under Section 564(b)(1) of the Act, 21 U.S.C. section 360bbb-3(b)(1), unless the authorization is terminated or revoked sooner. Performed at [facility], [address], [place], [place] [address]   Urinalysis, Routine w reflex microscopic     Status: Abnormal   Collection Time: 12/04/19  7:39 AM  Result Value Ref Range   Color, Urine YELLOW YELLOW   APPearance CLEAR CLEAR   Specific Gravity, Urine 1.019 1.005 - 1.030   pH 5.0 5.0 - 8.0   Glucose, UA NEGATIVE NEGATIVE mg/dL   Hgb urine dipstick SMALL (A) NEGATIVE   Bilirubin Urine NEGATIVE NEGATIVE   Ketones, ur 20 (A) NEGATIVE mg/dL   Protein, ur NEGATIVE NEGATIVE mg/dL   Nitrite NEGATIVE NEGATIVE   Leukocytes,Ua NEGATIVE NEGATIVE   RBC /  HPF 21-50 0 - 5  RBC/hpf   WBC, UA 0-5 0 - 5 WBC/hpf   Bacteria, UA NONE SEEN NONE SEEN   Squamous Epithelial / LPF 0-5 0 - 5   Mucus PRESENT    Hyaline Casts, UA PRESENT    Ca Oxalate Crys, UA PRESENT     Comment: Performed at [facility] [address], [place], [place] [address]  CBC     Status: Abnormal   Collection Time: 12/04/19  9:34 AM  Result Value Ref Range   WBC 5.6 4.0 - 10.5 K/uL   RBC 4.31 4.22 - 5.81 MIL/uL   Hemoglobin 14.0 13.0 - 17.0 g/dL   HCT 41.3 39.0 - 52.0 %   MCV 95.8 80.0 - 100.0 fL   MCH 32.5 26.0 - 34.0 pg   MCHC 33.9 30.0 - 36.0 g/dL   RDW 13.7 11.5 - 15.5 %   Platelets 89 (L) 150 - 400 K/uL    Comment: Immature Platelet Fraction may be clinically indicated, consider ordering this additional test [id number] PLATELET COUNT CONFIRMED BY SMEAR REPEATED TO VERIFY    nRBC 0.0 0.0 - 0.2 %    Comment: Performed at [facility], [place] [address], [place], [place] [address]  Comprehensive metabolic panel     Status: Abnormal   Collection Time: 12/04/19  9:34 AM  Result Value Ref Range   Sodium 142 135 - 145 mmol/L   Potassium 3.8 3.5 - 5.1 mmol/L   Chloride 106 98 - 111 mmol/L   CO2 24 22 - 32 mmol/L   Glucose, Bld 89 70 - 99 mg/dL    Comment: Glucose reference range applies only to samples taken after fasting for at least 8 hours.   BUN 25 (H) 8 - 23 mg/dL   Creatinine, Ser 1.17 0.61 - 1.24 mg/dL   Calcium 9.4 8.9 - 10.3 mg/dL   Total Protein 5.8 (L) 6.5 - 8.1 g/dL   Albumin 3.4 (L) 3.5 - 5.0 g/dL   AST 41 15 - 41 U/L   ALT 32 0 - 44 U/L   Alkaline Phosphatase 68 38 - 126 U/L   Total Bilirubin 3.5 (H) 0.3 - 1.2 mg/dL   GFR calc non Af Amer 56 (L) >60 mL/min   GFR calc Af Amer >60 >60 mL/min   Anion gap 12 5 - 15    Comment: Performed at [facility] [address], [place], [place] [address]    CT Head Wo Contrast  Result Date: 12/03/2019 CLINICAL DATA:  Multiple recent falls with headaches and confusion, initial encounter EXAM: CT HEAD  WITHOUT CONTRAST CT CERVICAL SPINE WITHOUT CONTRAST TECHNIQUE: Multidetector CT imaging of the head and cervical spine was performed following the standard protocol without intravenous contrast. Multiplanar CT image reconstructions of the cervical spine were also generated. COMPARISON:  02/16/2019 FINDINGS: CT HEAD FINDINGS Brain: Chronic atrophic and ischemic changes are again noted and stable. No findings to suggest acute hemorrhage, acute infarction or space-occupying mass lesion are seen. Vascular: No hyperdense vessel or unexpected calcification. Skull: Normal. Negative for fracture or focal lesion. Sinuses/Orbits: No acute finding. Other: None. CT CERVICAL SPINE FINDINGS Alignment: Within normal limits. Skull base and vertebrae: 7 cervical segments are well visualized. Vertebral body height is well maintained. No acute fracture or acute facet abnormality is noted. Multilevel facet hypertrophic changes are seen. Disc space narrowing at C5-6 and C6-7 with associated osteophytes is noted. Soft tissues and spinal canal: Diffuse vascular calcifications  are noted. No acute soft tissue abnormality is noted. Upper chest: Visualized lung apices are unremarkable. Other: None IMPRESSION: CT of the head: Chronic atrophic and ischemic changes without acute abnormality. CT of the cervical spine: Multilevel degenerative change without acute abnormality. Electronically Signed   By: Inez Catalina M.D.   On: 12/03/2019 21:06   CT Cervical Spine Wo Contrast  Result Date: 12/03/2019 CLINICAL DATA:  Multiple recent falls with headaches and confusion, initial encounter EXAM: CT HEAD WITHOUT CONTRAST CT CERVICAL SPINE WITHOUT CONTRAST TECHNIQUE: Multidetector CT imaging of the head and cervical spine was performed following the standard protocol without intravenous contrast. Multiplanar CT image reconstructions of the cervical spine were also generated. COMPARISON:  02/16/2019 FINDINGS: CT HEAD FINDINGS Brain: Chronic atrophic  and ischemic changes are again noted and stable. No findings to suggest acute hemorrhage, acute infarction or space-occupying mass lesion are seen. Vascular: No hyperdense vessel or unexpected calcification. Skull: Normal. Negative for fracture or focal lesion. Sinuses/Orbits: No acute finding. Other: None. CT CERVICAL SPINE FINDINGS Alignment: Within normal limits. Skull base and vertebrae: 7 cervical segments are well visualized. Vertebral body height is well maintained. No acute fracture or acute facet abnormality is noted. Multilevel facet hypertrophic changes are seen. Disc space narrowing at C5-6 and C6-7 with associated osteophytes is noted. Soft tissues and spinal canal: Diffuse vascular calcifications are noted. No acute soft tissue abnormality is noted. Upper chest: Visualized lung apices are unremarkable. Other: None IMPRESSION: CT of the head: Chronic atrophic and ischemic changes without acute abnormality. CT of the cervical spine: Multilevel degenerative change without acute abnormality. Electronically Signed   By: Inez Catalina M.D.   On: 12/03/2019 21:06   MR THORACIC SPINE WO CONTRAST  Result Date: 12/04/2019 CLINICAL DATA:  Mid back pain. Suspected compression fracture. Question cord compression. EXAM: MRI THORACIC SPINE WITHOUT CONTRAST TECHNIQUE: Multiplanar, multisequence MR imaging of the thoracic spine was performed. No intravenous contrast was administered. COMPARISON:  Chest radiography 12/03/2019.  Abdominal CT 12/03/2019 FINDINGS: Alignment: Thoracic curvature convex to the right with the apex at T8-9. Vertebrae: Old healed minor compression deformity at T2 with loss of height of only 10-20%. Recent compression fracture at T12 with near complete loss of height centrally. Retropulsion of 9 mm, encroaching upon the spinal canal. Effacement of the subarachnoid space without frank compression of the cord being demonstrated. Recent superior endplate fracture at L1 with loss of height of 30%.  No retropulsed bone at that level. Cord: No primary cord lesion. As noted above, at the T12 level, despite 9 mm of retropulsion, cord itself does not appear grossly compressed, though the subarachnoid space surrounding it is effaced. Paraspinal and other soft tissues: Otherwise negative Disc levels: No significant disc space pathology in the thoracic region. Ordinary age related desiccation and loss of height. IMPRESSION: Recent compression fracture at T12 with near complete loss of height centrally. Retropulsed 9 mm encroaches upon the spinal canal, effacing the subarachnoid space at that level, but without definite cord compression/deformity. Recent superior endplate fracture at L1 with loss of height of 30%. No retropulsed bone at that level. Electronically Signed   By: Nelson Chimes M.D.   On: 12/04/2019 16:11   DG Chest Portable 1 View  Result Date: 12/03/2019 CLINICAL DATA:  86 year old male with altered mental status. EXAM: PORTABLE CHEST 1 VIEW COMPARISON:  Chest radiograph dated 09/11/2019. FINDINGS: Minimal left lung base density, likely atelectasis. Residual or recurrent infiltrate is less likely but not excluded clinical correlation is recommended. No  lobar consolidation, pleural effusion, pneumothorax. Mild cardiomegaly. Atherosclerotic calcification of the aorta. Median sternotomy wires and CABG vascular clips. No acute osseous pathology. Right upper quadrant cholecystectomy clips. IMPRESSION: 1. Probable atelectasis at the left lung base. 2. Mild cardiomegaly. Electronically Signed   By: Anner Crete M.D.   On: 12/03/2019 19:23   CT Renal Stone Study  Result Date: 12/03/2019 CLINICAL DATA:  86 year old male with left flank pain. Concern for kidney stone. EXAM: CT ABDOMEN AND PELVIS WITHOUT CONTRAST TECHNIQUE: Multidetector CT imaging of the abdomen and pelvis was performed following the standard protocol without IV contrast. COMPARISON:  None. FINDINGS: Evaluation of this exam is limited in  the absence of intravenous contrast. Lower chest: Left lung base subpleural density likely represents a small pleural effusion and associated left lung base atelectasis or infiltrate. Clinical correlation is recommended. Three vessel coronary vascular calcification. No intra-abdominal free air or free fluid. Hepatobiliary: The liver is unremarkable. No intrahepatic biliary ductal dilatation. Cholecystectomy. No retained calcified stone noted in the central CBD. Pancreas: Unremarkable. No pancreatic ductal dilatation or surrounding inflammatory changes. Spleen: Normal in size without focal abnormality. Adrenals/Urinary Tract: The adrenal glands are unremarkable. There is a 12 mm right renal upper pole nonobstructing stone. No hydronephrosis. Right renal interpolar hypodense lesion measures up to 4 cm. This is suboptimally characterized but demonstrates fluid attenuation, likely a cyst. There is no hydronephrosis or nephrolithiasis on the left. The visualized ureters and urinary bladder appear unremarkable. Stomach/Bowel: There is moderate stool throughout the colon. Several scattered sigmoid diverticula without active inflammatory changes. There is no bowel obstruction or active inflammation. The appendix is normal. Vascular/Lymphatic: Advanced aortoiliac atherosclerotic disease. The IVC is unremarkable. No portal venous gas. There is no adenopathy. Reproductive: The prostate and seminal vesicles are grossly unremarkable. No pelvic masses Other: None Musculoskeletal: Osteopenia with scoliosis and degenerative changes of the spine. There is compression fracture of the T12 vertebra with near complete loss of vertebral body height, age indeterminate but likely acute or subacute. There is approximately 9 mm retropulsion of the posterior vertebral body. There is also compression fracture of the superior endplate of L1 with approximately 50% loss of vertebral body height. There is mild perivertebral hematoma at T12 with  extension along the left pleural space. There is moderate to severe narrowing of the central canal at T12 due to retropulsion. Correlation with neurological exam recommended. If there is neurological deficit or concern for cord compression, further evaluation with MRI is advised. There is nondisplaced fracture of the left L1 transverse process. IMPRESSION: 1. Acute appearing compression fractures of the T12 with near complete loss of vertebral body height and 9 mm retropulsion of the posterior vertebral body. There is moderate to severe narrowing of the central canal at T12 due to retropulsion. Correlation with neurological exam recommended. If there is neurological deficit or concern for cord compression, further evaluation with MRI is advised. 2. Acute appearing compression fracture of superior endplate of L1 with approximately 50% loss of vertebral body height. 3. Small left pleural effusion or hemorrhagic fluid related to vertebral fracture. 4. A 12 mm nonobstructing right renal upper pole stone. No hydronephrosis. 5. Moderate colonic stool burden. No bowel obstruction. Normal appendix. 6. Aortic Atherosclerosis (ICD10-I70.0). Electronically Signed   By: Anner Crete M.D.   On: 12/03/2019 21:16    Review of Systems - Per HPI   Blood pressure 110/66, pulse 90, temperature 97.9 F (36.6 C), temperature source Oral, resp. rate 18, height 5\' 11"  (1.803 m), weight 77.1  kg, SpO2 100 %. Physical Exam  Constitutional: He is oriented to person, place, and time. He appears well-developed and well-nourished.  HENT:  Head: Normocephalic and atraumatic.  Eyes: Pupils are equal, round, and reactive to light. EOM are normal.  Musculoskeletal:     Cervical back: Normal range of motion and neck supple.  Neurological: He is alert and oriented to person, place, and time. He has normal strength. No cranial nerve deficit or sensory deficit. GCS eye subscore is 4. GCS verbal subscore is 4. GCS motor subscore is 6.  He displays no Babinski's sign on the right side. He displays no Babinski's sign on the left side.     Patient is lying in bed in NAD. He is extremely HOH and is having hallucinations. He is A/O X 4 but is confused. His speech is severely garbled which is his baseline per his family. Patrick's test negative bilaterally. Straight leg raise negative bilaterally.   Assessment/Plan: T12 compression fracture without cord compression at T12 with 59mm of retropulsion. T12 spinal stenosis. Left-sided radiculopathy. Superior endplate fracture at L1 with no retropulsed bone at that level. Patient is not a good surgical candidate due to his multiple comorbidities. Work on [employer]. He will need TSLO back brace when out of bed. OK to mobilize patient in brace.  He has no weakness in his legs and based on thoracic MRI, while there is some spinal stenosis, there is not severe cord compression.   Peggyann Shoals, MD 12/05/2019, 9:42 AM

## 2019-12-06 ENCOUNTER — Encounter (HOSPITAL_COMMUNITY): Payer: Self-pay | Admitting: Family Medicine

## 2019-12-06 DIAGNOSIS — E876 Hypokalemia: Secondary | ICD-10-CM | POA: Diagnosis present

## 2019-12-06 LAB — URINE CULTURE: Culture: NO GROWTH

## 2019-12-06 LAB — BASIC METABOLIC PANEL
Anion gap: 9 (ref 5–15)
BUN: 22 mg/dL (ref 8–23)
CO2: 22 mmol/L (ref 22–32)
Calcium: 8.4 mg/dL — ABNORMAL LOW (ref 8.9–10.3)
Chloride: 108 mmol/L (ref 98–111)
Creatinine, Ser: 0.99 mg/dL (ref 0.61–1.24)
GFR calc Af Amer: >60 mL/min (ref >60)
GFR calc non Af Amer: >60 mL/min (ref >60)
Glucose, Bld: 73 mg/dL (ref 70–99)
Potassium: 3.1 mmol/L — ABNORMAL LOW (ref 3.5–5.1)
Sodium: 139 mmol/L (ref 135–145)

## 2019-12-06 LAB — CBC
HCT: 38.7 % — ABNORMAL LOW (ref 39.0–52.0)
Hemoglobin: 12.9 g/dL — ABNORMAL LOW (ref 13.0–17.0)
MCH: 32 pg (ref 26.0–34.0)
MCHC: 33.3 g/dL (ref 30.0–36.0)
MCV: 96 fL (ref 80.0–100.0)
Platelets: 75 10*3/uL — ABNORMAL LOW (ref 150–400)
RBC: 4.03 MIL/uL — ABNORMAL LOW (ref 4.22–5.81)
RDW: 13.4 % (ref 11.5–15.5)
WBC: 4.6 10*3/uL (ref 4.0–10.5)
nRBC: 0 % (ref 0.0–0.2)

## 2019-12-06 LAB — MAGNESIUM: Magnesium: 1.9 mg/dL (ref 1.7–2.4)

## 2019-12-06 MED ORDER — POTASSIUM CHLORIDE CRYS ER 20 MEQ PO TBCR
40.0000 meq | EXTENDED_RELEASE_TABLET | ORAL | Status: AC
  Administered 2019-12-06 (×2): 40 meq via ORAL
  Filled 2019-12-06 (×3): qty 2

## 2019-12-06 NOTE — Progress Notes (Signed)
Progress Note    John [name]  TDS:[id number] DOB: 1929-12-03  DOA: 12/03/2019 PCP: Annie Main, FNP    Brief Narrative:    Medical records reviewed and are as summarized below:  John [name] is an 86 y.o. male with a past medical history that includes CAD status post CABG, STEMI in 2017, chronic kidney disease stage III, chronic combined systolic diastolic heart failure, ischemic cardiomyopathy with an EF of 30%, hypertension, chronic thrombocytopenia, hyperlipidemia, Covid + March 2021 transferred to [facility] on June 1 from [facility] as recommended by neurosurgery for evaluation of compression fracture of T10 and T12.  MRI was obtained he was evaluated by neurosurgery who opined not a surgical candidate.  Recommended mobilization with TSLO brace. Awaiting PT evaluation for recommendations for discharge plan.  Patient experienced increase confusion and hallucinations. No signs infection. Mild hypokalemia. Received haldol and tele sitter for night  Assessment/Plan:   Principal Problem:   Compression fracture of T12 vertebra (HCC) Active Problems:   Hypokalemia   CAD in native artery   Essential hypertension, benign   CKD (chronic kidney disease), stage III   Acute encephalopathy   Chronic back pain  #1.  Compression fracture of T10 and T12.  Related to a fall.  Patient had been placed on Flexeril for chronic pain and Bactrim for UTI prior to admission.  Evaluated by neurosurgery who opined T12 compression fracture with 9 mm retropulsion without cord compression and not a good surgical candidate.  Recommended TSLO brace when out of bed and mobilization. -Awaiting PT recommendations - will likely need placement -Pain management -TSLO brace when out of bed.  #2.  Acute encephalopathy.  Family reports patient began with hallucinations and worsening confusion prior to admission.  Baseline is mild to moderate dementia however he lives alone and manages his own affairs  with close supervision.  Yesterday hallucinations and confusion worsened.  Exam benign.  No signs of infection.  Mild hypokalemia.  Checked for urinary retention but chart indicates patient voiding freely.  Provided with IV fluids, Haldol x1 as well as telemetry sitter and delirium precautions.  Appears a little better this morning.  Sitting up up eating pancakes. -Continue delirium precautions Animator when family not in room -K. Dur -PT evaluation for recommendations for discharge plan  3.  Combined systolic/diastolic heart failure/ischemic cardiomyopathy.  Provided with gentle IV fluids yesterday as he appeared a little dry.  Echo in December 2020 revealed an EF of 25 to 30%.  Home medications include Entresto, Lasix, metoprolol. -Continue to hold Lasix for now -Continue Entresto and beta-blocker -Monitor intake and output -Obtain daily weights  #4.  Hypertension. Fair control.  Home medications include Lasix, Aldactone, metoprolol. -Continuing Aldactone and metoprolol -Lasix for now -Monitor  #5.  CAD status post CABG.  No chest pain.  EKG with a left bundle branch block similar to previous.  Home medications include aspirin, Plavix and metoprolol. -Continue aspirin   #6.  Chronic kidney disease stage III.  Creatinine 0.99 this morning.  Voiding freely. -Monitor urine output -Hold nephrotoxins as able  #7. hypokalemia.  Potassium level 3.1.  Likely related to decreased oral intake. -Replete -Check magnesium level -Be met in the a.m.    Family Communication/Anticipated D/C date and plan/Code Status   DVT prophylaxis: scd ordered. Code Status: Full Code.  Family Communication: granddaughter at bedside Disposition Plan: Status is: Inpatient  Remains inpatient appropriate because:IV treatments appropriate due to intensity of illness or inability to  take PO   Dispo: The patient is from: Home              Anticipated d/c is to: SNF              Anticipated d/c date  is: 2 days              Patient currently is not medically stable to d/c.          Medical Consultants:    Vertell Limber neurosurgery   Anti-Infectives:    None  Subjective:   Head of bed up.  Brace off.  Eating pancakes and smiling.  Objective:    Vitals:   12/05/19 1943 12/05/19 2253 12/06/19 0506 12/06/19 0723  BP: 117/68 132/72 133/64 136/67  Pulse: 78 72 65 62  Resp: 16  16 14   Temp: 97.6 F (36.4 C)  97.7 F (36.5 C) (!) 97.2 F (36.2 C)  TempSrc: Oral   Oral  SpO2: 100%  96% 99%  Weight:      Height:        Intake/Output Summary (Last 24 hours) at 12/06/2019 0849 Last data filed at 12/06/2019 0300 Gross per 24 hour  Intake 1345.34 ml  Output 400 ml  Net 945.34 ml   Filed Weights   12/03/19 1851  Weight: 77.1 kg    Exam: General:Somewhat pale well-nourished head of bed  elevated and patient is smiling.  No acute distress  CV: Regular rate and rhythm no murmur gallop or rub no lower extremity edema Respiratory: No increased work of breathing breath sounds are distant but clear I hear no wheeze or crackles Abdomen: Nondistended soft positive bowel sounds throughout no guarding or rebounding nontender to palpation Musculoskeletal: Joints without swelling/erythema T SLO brace off. Neuro: Very hard of hearing but oriented to self and place moving extremities spontaneously  Data Reviewed:   I have personally reviewed following labs and imaging studies:  Labs: Labs show the following:   Basic Metabolic Panel: Recent Labs  Lab 12/03/19 2022 12/03/19 2022 12/04/19 0934 12/04/19 0934 12/05/19 1525 12/06/19 0449  NA 139  --  142  --  140 139  K 3.5   < > 3.8   < > 3.4* 3.1*  CL 105  --  106  --  106 108  CO2 24  --  24  --  24 22  GLUCOSE 114*  --  89  --  81 73  BUN 28*  --  25*  --  23 22  CREATININE 1.05  --  1.17  --  1.10 0.99  CALCIUM 9.1  --  9.4  --  9.0 8.4*  MG 2.0  --   --   --   --   --    < > = values in this interval not displayed.     GFR Estimated Creatinine Clearance: 56 mL/min (by C-G formula based on SCr of 0.99 mg/dL). Liver Function Tests: Recent Labs  Lab 12/03/19 2022 12/04/19 0934  AST 48* 41  ALT 33 32  ALKPHOS 73 68  BILITOT 3.7* 3.5*  PROT 6.2* 5.8*  ALBUMIN 3.5 3.4*   No results for input(s): LIPASE, AMYLASE in the last 168 hours. No results for input(s): AMMONIA in the last 168 hours. Coagulation profile No results for input(s): INR, PROTIME in the last 168 hours.  CBC: Recent Labs  Lab 12/03/19 2022 12/04/19 0934 12/06/19 0449  WBC 6.4 5.6 4.6  NEUTROABS 5.1  --   --  HGB 14.3 14.0 12.9*  HCT 42.4 41.3 38.7*  MCV 95.7 95.8 96.0  PLT 91* 89* 75*   Cardiac Enzymes: Recent Labs  Lab 12/03/19 09-18-20  CKTOTAL 715*   BNP (last 3 results) No results for input(s): PROBNP in the last 8760 hours. CBG: No results for input(s): GLUCAP in the last 168 hours. D-Dimer: No results for input(s): DDIMER in the last 72 hours. Hgb A1c: No results for input(s): HGBA1C in the last 72 hours. Lipid Profile: No results for input(s): CHOL, HDL, LDLCALC, TRIG, CHOLHDL, LDLDIRECT in the last 72 hours. Thyroid function studies: Recent Labs    12/03/19 2020-09-18  TSH 1.701   Anemia work up: No results for input(s): VITAMINB12, FOLATE, FERRITIN, TIBC, IRON, RETICCTPCT in the last 72 hours. Sepsis Labs: Recent Labs  Lab 12/03/19 2020/09/18 12/04/19 0934 12/06/19 0449  WBC 6.4 5.6 4.6    Microbiology Recent Results (from the past 240 hour(s))  SARS Coronavirus 2 by RT PCR (hospital order, performed in [facility] hospital lab) Nasopharyngeal Nasopharyngeal Swab     Status: None   Collection Time: 12/03/19 10:20 PM   Specimen: Nasopharyngeal Swab  Result Value Ref Range Status   SARS Coronavirus 2 NEGATIVE NEGATIVE Final    Comment: (NOTE) SARS-CoV-2 target nucleic acids are NOT DETECTED. The SARS-CoV-2 RNA is generally detectable in upper and lower respiratory specimens during the acute phase of  infection. The lowest concentration of SARS-CoV-2 viral copies this assay can detect is 250 copies / mL. A negative result does not preclude SARS-CoV-2 infection and should not be used as the sole basis for treatment or other patient management decisions.  A negative result may occur with improper specimen collection / handling, submission of specimen other than nasopharyngeal swab, presence of viral mutation(s) within the areas targeted by this assay, and inadequate number of viral copies (<250 copies / mL). A negative result must be combined with clinical observations, patient history, and epidemiological information. Fact Sheet for Patients:   StrictlyIdeas.no Fact Sheet for Healthcare Providers: BankingDealers.co.za This test is not yet approved or cleared  by the [place] FDA and has been authorized for detection and/or diagnosis of SARS-CoV-2 by FDA under an Emergency Use Authorization (EUA).  This EUA will remain in effect (meaning this test can be used) for the duration of the COVID-19 declaration under Section 564(b)(1) of the Act, 21 U.S.C. section 360bbb-3(b)(1), unless the authorization is terminated or revoked sooner. Performed at [facility], [address] [place], [place] [address]   Urine Culture     Status: None   Collection Time: 12/05/19  7:39 AM   Specimen: Urine, Clean Catch  Result Value Ref Range Status   Specimen Description URINE, CLEAN CATCH  Final   Special Requests NONE  Final   Culture   Final    NO GROWTH Performed at [facility], [address], [place], [place] [address]    Report Status 12/06/2019 FINAL  Final    Procedures and diagnostic studies:  MR THORACIC SPINE WO CONTRAST  Result Date: 12/04/2019 CLINICAL DATA:  Mid back pain. Suspected compression fracture. Question cord compression. EXAM: MRI THORACIC SPINE WITHOUT CONTRAST TECHNIQUE: Multiplanar, multisequence MR imaging of the  thoracic spine was performed. No intravenous contrast was administered. COMPARISON:  Chest radiography 12/03/2019.  Abdominal CT 12/03/2019 FINDINGS: Alignment: Thoracic curvature convex to the right with the apex at T8-9. Vertebrae: Old healed minor compression deformity at T2 with loss of height of only 10-20%. Recent compression fracture  at T12 with near complete loss of height centrally. Retropulsion of 9 mm, encroaching upon the spinal canal. Effacement of the subarachnoid space without frank compression of the cord being demonstrated. Recent superior endplate fracture at L1 with loss of height of 30%. No retropulsed bone at that level. Cord: No primary cord lesion. As noted above, at the T12 level, despite 9 mm of retropulsion, cord itself does not appear grossly compressed, though the subarachnoid space surrounding it is effaced. Paraspinal and other soft tissues: Otherwise negative Disc levels: No significant disc space pathology in the thoracic region. Ordinary age related desiccation and loss of height. IMPRESSION: Recent compression fracture at T12 with near complete loss of height centrally. Retropulsed 9 mm encroaches upon the spinal canal, effacing the subarachnoid space at that level, but without definite cord compression/deformity. Recent superior endplate fracture at L1 with loss of height of 30%. No retropulsed bone at that level. Electronically Signed   By: Nelson Chimes M.D.   On: 12/04/2019 16:11    Medications:   . acetaminophen  1,000 mg Oral TID  . aspirin EC  81 mg Oral Daily  . fludrocortisone  100 mcg Oral Daily  . metoprolol succinate  25 mg Oral BID  . pantoprazole  40 mg Oral Daily  . potassium chloride  40 mEq Oral Q4H  . sodium chloride flush  3 mL Intravenous Q12H  . tamsulosin  0.4 mg Oral Daily   Continuous Infusions: . sodium chloride    . sodium chloride 100 mL/hr at 12/05/19 2253  . methocarbamol (ROBAXIN) IV       LOS: 3 days   Radene Gunning NP  [facility]   How to contact the [facility] Attending or Consulting provider [place] or covering provider during after hours [place], for this patient?  1. Check the care team in [facility] and look for a) attending/consulting TR[facility] provider listed and b) the [facility] team listed 2. Log into [email or web address] and use [place]'s universal password to access. If you do not have the password, please contact the hospital operator. 3. Locate the [facility] provider you are looking for under [facility] and page to a number that you can be directly reached. 4. If you still have difficulty reaching the provider, please page the [facility] (Director on Call) for the Hospitalists listed on amion for assistance.  12/06/2019, 8:49 AM

## 2019-12-06 NOTE — Progress Notes (Signed)
Subjective: Patient reports "I feel like I'm doing better, I'm not seeing as much stuff crawling on the ceiling today." Patient is lying comfortably in bed and is not currently complaining of pain. Pain becomes an issue with changes in position and rolling from side to side. Daughter and granddaughter are at bedside and report "he seems to be a little more clear today than yesterday."  Granddaughter expressed concern about caring for the patient post discharge as her and her mom are the primary caregivers for the patient, and her mom was recently diagnosed with breast cancer.   Objective: Vital signs in last 24 hours: Temp:  [97.2 F (36.2 C)-97.7 F (36.5 C)] 97.2 F (36.2 C) (06/03 0723) Pulse Rate:  [62-80] 62 (06/03 0723) Resp:  [14-16] 14 (06/03 0723) BP: (117-136)/(64-72) 136/67 (06/03 0723) SpO2:  [96 %-100 %] 99 % (06/03 0723)  Intake/Output from previous day: 06/02 0701 - 06/03 0700 In: 1345.3 [P.O.:240; I.V.:1105.3] Out: 400 [Urine:400] Intake/Output this shift: Total I/O In: 120 [P.O.:120] Out: -   Physical Exam: Constitutional: He is oriented to person, place, and time. He appears well-developed and well-nourished.  HENT:  Head: Normocephalic and atraumatic.  Eyes: Pupils are equal, round, and reactive to light. EOM are normal.  Musculoskeletal:     Cervical back: Normal range of motion and neck supple.  Neurological: He is alert and oriented to person, place, and time. He has normal strength. No cranial nerve deficit or sensory deficit. GCS eye subscore is 4. GCS verbal subscore is 4. GCS motor subscore is 6. He displays no Babinski's sign on the right side. He displays no Babinski's sign on the left side.   Patient is lying in bed in NAD. He is extremely HOH. His hallucinations appear to have decreased and seems to be more organized in his thinking. His overall mental status has slightly improved from yesterdays exam. He is A/O X 4 but is still slightly confused. His  speech is severely garbled which is his baseline per his family. Patrick's test negative bilaterally. Straight leg raise negative bilaterally.    Lab Results: Recent Labs    12/04/19 0934 12/06/19 0449  WBC 5.6 4.6  HGB 14.0 12.9*  HCT 41.3 38.7*  PLT 89* 75*   BMET Recent Labs    12/05/19 1525 12/06/19 0449  NA 140 139  K 3.4* 3.1*  CL 106 108  CO2 24 22  GLUCOSE 81 73  BUN 23 22  CREATININE 1.10 0.99  CALCIUM 9.0 8.4*    Studies/Results: MR THORACIC SPINE WO CONTRAST  Result Date: 12/04/2019 CLINICAL DATA:  Mid back pain. Suspected compression fracture. Question cord compression. EXAM: MRI THORACIC SPINE WITHOUT CONTRAST TECHNIQUE: Multiplanar, multisequence MR imaging of the thoracic spine was performed. No intravenous contrast was administered. COMPARISON:  Chest radiography 12/03/2019.  Abdominal CT 12/03/2019 FINDINGS: Alignment: Thoracic curvature convex to the right with the apex at T8-9. Vertebrae: Old healed minor compression deformity at T2 with loss of height of only 10-20%. Recent compression fracture at T12 with near complete loss of height centrally. Retropulsion of 9 mm, encroaching upon the spinal canal. Effacement of the subarachnoid space without frank compression of the cord being demonstrated. Recent superior endplate fracture at L1 with loss of height of 30%. No retropulsed bone at that level. Cord: No primary cord lesion. As noted above, at the T12 level, despite 9 mm of retropulsion, cord itself does not appear grossly compressed, though the subarachnoid space surrounding it is effaced. Paraspinal and other  soft tissues: Otherwise negative Disc levels: No significant disc space pathology in the thoracic region. Ordinary age related desiccation and loss of height. IMPRESSION: Recent compression fracture at T12 with near complete loss of height centrally. Retropulsed 9 mm encroaches upon the spinal canal, effacing the subarachnoid space at that level, but without  definite cord compression/deformity. Recent superior endplate fracture at L1 with loss of height of 30%. No retropulsed bone at that level. Electronically Signed   By: Nelson Chimes M.D.   On: 12/04/2019 16:11    Assessment/Plan: T12 compression fracture without cord compression at T12 with 46mm of retropulsion. T12 spinal stenosis. Left-sided radiculopathy. Superior endplate fracture at L1 with no retropulsed bone at that level. Patient is not a good surgical candidate due to his multiple comorbidities. Work on Tax adviser.Continue TSLO back brace when out of bed. OK to mobilize patient in brace.  He has no weakness in his legs and based on thoracic MRI, while there is some spinal stenosis, there is not severe cord compression. Begin working with PT.        LOS: 3 days    Peggyann Shoals, MD 12/06/2019, 11:21 AM

## 2019-12-06 NOTE — Evaluation (Signed)
Physical Therapy Evaluation Patient Details Name: John [name] MRN: [id number] DOB: 07/19/1929 Today's Date: 12/06/2019   History of Present Illness  Pt is a 86 y.o. M with significant PMH of CAD s/p CABG, STEMI, CKD stage III, CHF, Covid 19, who presents following a fall with T12 compression fracture and superior endplate fracture at L1 and acute encephalopathy. Pt considered not a good surgical candidate by neurosurgery.  Clinical Impression  Prior to admission, pt lives alone, ambulates with a walker, and has history of frequent falls. On PT evaluation, pt presents with decreased functional mobility secondary to cognitive impairments, balance deficits (posterior lean in sitting/standing), weakness, and back pain. Pt daughter reports improvement in cognition from yesterday to today. Pt requiring heavy moderate assist for transfers out of bed to chair and is unable to ambulate. Also has difficulty with command following due to extremely HOH. Recommended lift equipment Charlaine Dalton) to nursing staff for transfer back to bed. Recommending SNF at discharge to address deficits and maximize functional independence.     Follow Up Recommendations SNF;Supervision/Assistance - 24 hour    Equipment Recommendations  Other (comment)(defer)    Recommendations for Other Services       Precautions / Restrictions Precautions Precautions: Fall;Back Precaution Booklet Issued: No Required Braces or Orthoses: Spinal Brace Spinal Brace: Thoracolumbosacral orthotic(Clamshell) Restrictions Weight Bearing Restrictions: No      Mobility  Bed Mobility Overal bed mobility: Needs Assistance Bed Mobility: Rolling;Sidelying to Sit;Sit to Sidelying Rolling: Min assist Sidelying to sit: Mod assist     Sit to sidelying: Mod assist General bed mobility comments: MinA for rolling to right and left for placement of TLSO. ModA for sidelying <> sit with assist for trunk to upright and use of bed pad to scoot hips  anteriorly  Transfers Overall transfer level: Needs assistance Equipment used: Rolling walker (2 wheeled);None Transfers: Sit to/from [facility] Sit to Stand: Mod assist Stand pivot transfers: Mod assist       General transfer comment: Pt able to stand x 3 times from edge of bed to walker with PT stabilizing walker and blocking feet to prevent anterior slide. Has heavy posterior lean. Difficulty weight shifting to clear feet. Ultimately performed face to face stand pivot transfer to guide pt as he has difficulty following commands due to [facility]  Ambulation/Gait                Stairs            Wheelchair Mobility    Modified Rankin (Stroke Patients Only)       Balance Overall balance assessment: Needs assistance Sitting-balance support: Feet supported Sitting balance-Leahy Scale: Poor Sitting balance - Comments: Posterior lean; pt aware of it but needs multimodal cues to correct   Standing balance support: Bilateral upper extremity supported Standing balance-Leahy Scale: Poor Standing balance comment: reliant on external support                             Pertinent Vitals/Pain Pain Assessment: Faces Faces Pain Scale: Hurts even more Pain Location: back Pain Descriptors / Indicators: Grimacing;Guarding Pain Intervention(s): Monitored during session;Limited activity within patient's tolerance    Home Living Family/patient expects to be discharged to:: Private residence Living Arrangements: Alone Available Help at Discharge: Family;Available PRN/intermittently Type of Home: House Home Access: Stairs to enter   Entrance Stairs-Number of Steps: 3 Home Layout: One level Home Equipment: Walker - 2 wheels;Bedside commode;Shower seat;Cane - single point  Prior Function Level of Independence: Independent with assistive device(s)         Comments: household ambulator with RW     Hand Dominance        Extremity/Trunk  Assessment   Upper Extremity Assessment Upper Extremity Assessment: Generalized weakness    Lower Extremity Assessment Lower Extremity Assessment: Generalized weakness    Cervical / Trunk Assessment Cervical / Trunk Assessment: Other exceptions Cervical / Trunk Exceptions: T12 compression fx  Communication   Communication: HOH  Cognition Arousal/Alertness: Awake/alert Behavior During Therapy: WFL for tasks assessed/performed Overall Cognitive Status: Difficult to assess Area of Impairment: Problem solving                             Problem Solving: Difficulty sequencing;Requires verbal cues;Requires tactile cues General Comments: Pt extremely HOH, now A&Ox4. Pt daughter reports confusion has improved from yesterday. Has difficulty sequencing/problem solving.      General Comments      Exercises     Assessment/Plan    PT Assessment Patient needs continued PT services  PT Problem List Decreased strength;Decreased activity tolerance;Decreased balance;Decreased mobility;Decreased cognition;Decreased safety awareness;Pain       PT Treatment Interventions DME instruction;Gait training;Therapeutic activities;Functional mobility training;Therapeutic exercise;Balance training;Patient/family education    PT Goals (Current goals can be found in the Care Plan section)  Acute Rehab PT Goals Patient Stated Goal: get to chair PT Goal Formulation: With patient Time For Goal Achievement: 12/20/19 Potential to Achieve Goals: Fair    Frequency Min 3X/week   Barriers to discharge Decreased caregiver support      Co-evaluation               AM-PAC PT "6 Clicks" Mobility  Outcome Measure Help needed turning from your back to your side while in a flat bed without using bedrails?: A Little Help needed moving from lying on your back to sitting on the side of a flat bed without using bedrails?: A Lot Help needed moving to and from a bed to a chair (including a  wheelchair)?: A Lot Help needed standing up from a chair using your arms (e.g., wheelchair or bedside chair)?: A Lot Help needed to walk in hospital room?: A Lot Help needed climbing 3-5 steps with a railing? : Total 6 Click Score: 12    End of Session Equipment Utilized During Treatment: Gait belt;Back brace Activity Tolerance: Patient tolerated treatment well Patient left: in chair;with call bell/phone within reach;with chair alarm set;with family/visitor present Nurse Communication: Mobility status;Need for lift equipment PT Visit Diagnosis: Unsteadiness on feet (R26.81);Muscle weakness (generalized) (M62.81);History of falling (Z91.81);Difficulty in walking, not elsewhere classified (R26.2);Pain Pain - part of body: (back)    Time: 1117-1200 PT Time Calculation (min) (ACUTE ONLY): 43 min   Charges:   PT Evaluation $PT Eval Moderate Complexity: 1 Mod PT Treatments $Therapeutic Activity: 23-37 mins          Wyona Almas, PT, DPT Acute Rehabilitation Services Pager [phone] Office [phone]   Deno Etienne 12/06/2019, 1:41 PM

## 2019-12-06 NOTE — Plan of Care (Signed)
  Problem: Education: Goal: Ability to verbalize activity precautions or restrictions will improve Outcome: Progressing   Problem: Activity: Goal: Ability to avoid complications of mobility impairment will improve Outcome: Progressing Goal: Will remain free from falls Outcome: Progressing   Problem: Pain Management: Goal: Pain level will decrease Outcome: Progressing   Problem: Skin Integrity: Goal: Will show signs of wound healing Outcome: Progressing   Problem: Health Behavior/Discharge Planning: Goal: Identification of resources available to assist in meeting health care needs will improve Outcome: Progressing

## 2019-12-06 NOTE — TOC Initial Note (Addendum)
Transition of Care [facility]) - Initial/Assessment Note    Patient Details  Name: John [name] MRN: [id number] Date of Birth: [birth year]  Transition of Care [facility]) CM/SW Contact:    Curlene Labrum, RN Phone Number: 12/06/2019, 2:11 PM  Clinical Narrative:                 Pt is a 86 y.o. M with significant PMH of CAD s/p CABG, STEMI, CKD stage III, CHF, Covid 31, who presents following a fall with T12 compression fracture and superior endplate fracture at L1 and acute encephalopathy. Pt considered not a good surgical candidate by neurosurgery.  Patient lives at home alone. Patient/ daughter given choice regarding SNF placement.  Will followup to determine SNF placement choice.  6/3 - The daughter verbalized that she would like to have her father evaluated for CIR if it was a possibility and have SNF placement as a backup.  The daughter is taking the wife of the patient to have treatment for breast cancer and wants to be comfortable with placement for the patient.  I mentioned that I would reach out to CIR first and then SNF.  Expected Discharge Plan: Skilled Nursing Facility Barriers to Discharge: (Waiting on SNf placement)   Patient Goals and CMS Choice Patient states their goals for this hospitalization and ongoing recovery are:: Patient wants to feel better. CMS Medicare.gov Compare Post Acute Care list provided to:: Patient Represenative (must comment) Choice offered to / list presented to : [place] / Guardian  Expected Discharge Plan and Services Expected Discharge Plan: [employer]   Discharge Planning Services: CM Consult Post Acute Care Choice: [place] Living arrangements for the past 2 months: Single Family Home                                      Prior Living Arrangements/Services Living arrangements for the past 2 months: Single Family Home Lives with:: Self Patient language and need for interpreter reviewed:: Yes Do you  feel safe going back to the place where you live?: Yes      Need for Family Participation in Patient Care: Yes (Comment) Care giver support system in place?: Yes (comment)   Criminal Activity/Legal Involvement Pertinent to Current Situation/Hospitalization: No - Comment as needed  Activities of Daily Living      Permission Sought/Granted Permission sought to share information with : Case Manager Permission granted to share information with : Yes, Verbal Permission Granted     Permission granted to share info w AGENCY: SNF facilities  Permission granted to share info w Relationship: daughter, Vaughan Basta     Emotional Assessment Appearance:: Appears stated age Attitude/Demeanor/Rapport: Gracious Affect (typically observed): Accepting Orientation: : Oriented to Self, Oriented to Place Alcohol / Substance Use: Not Applicable Psych Involvement: No (comment)  Admission diagnosis:  Injury of head, initial encounter [S09.90XA] Fall, initial encounter [W19.XXXA] Compression fracture of T12 vertebra, initial encounter [facility]) [id number] Patient Active Problem List   Diagnosis Date Noted  . Hypokalemia 12/06/2019  . Acute encephalopathy 12/05/2019  . CKD (chronic kidney disease), stage III 12/04/2019  . Compression fracture of T12 vertebra ([place]) 12/03/2019  . LVEF <40% 11/28/2019  . Chronic back pain 11/27/2019  . History of recent fall 10/29/2019  . 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 Dilated aortic root ([place]) 10/29/2019  . Thrombocytopenia ([place]) 10/29/2019  . Benign prostatic hyperplasia with urinary  obstruction 10/24/2019  . Urinary retention 09/19/2019  . Pneumonia due to COVID-19 virus 09/11/2019  . Sepsis ([place]) 09/11/2019  . Acute ST elevation myocardial infarction (STEMI) of posterior wall ([place]) 11/11/2015  . CAD in native artery 09/17/2013  . Essential hypertension, benign 09/17/2013  . Mixed hyperlipidemia 09/17/2013   PCP:  Annie Main, FNP Pharmacy:    [place] [address] [place] [address] [place] [address] Phone: [phone] Fax: [phone]     Social Determinants of Health (SDOH) Interventions    Readmission Risk Interventions Readmission Risk Prevention Plan 12/06/2019  Transportation Screening Complete  PCP or Specialist Appt within 5-7 Days Complete  Home Care Screening Complete  Medication Review (RN CM) Complete  Some recent data might be hidden

## 2019-12-07 ENCOUNTER — Encounter (HOSPITAL_COMMUNITY): Payer: Self-pay | Admitting: Family Medicine

## 2019-12-07 DIAGNOSIS — W19XXXA Unspecified fall, initial encounter: Secondary | ICD-10-CM

## 2019-12-07 LAB — CBC
HCT: 41.4 % (ref 39.0–52.0)
Hemoglobin: 14.2 g/dL (ref 13.0–17.0)
MCH: 33 pg (ref 26.0–34.0)
MCHC: 34.3 g/dL (ref 30.0–36.0)
MCV: 96.3 fL (ref 80.0–100.0)
Platelets: 82 10*3/uL — ABNORMAL LOW (ref 150–400)
RBC: 4.3 MIL/uL (ref 4.22–5.81)
RDW: 13.5 % (ref 11.5–15.5)
WBC: 5 10*3/uL (ref 4.0–10.5)
nRBC: 0 % (ref 0.0–0.2)

## 2019-12-07 LAB — BASIC METABOLIC PANEL
Anion gap: 11 (ref 5–15)
BUN: 17 mg/dL (ref 8–23)
CO2: 20 mmol/L — ABNORMAL LOW (ref 22–32)
Calcium: 8.7 mg/dL — ABNORMAL LOW (ref 8.9–10.3)
Chloride: 109 mmol/L (ref 98–111)
Creatinine, Ser: 0.91 mg/dL (ref 0.61–1.24)
GFR calc Af Amer: >60 mL/min (ref >60)
GFR calc non Af Amer: >60 mL/min (ref >60)
Glucose, Bld: 71 mg/dL (ref 70–99)
Potassium: 3.8 mmol/L (ref 3.5–5.1)
Sodium: 140 mmol/L (ref 135–145)

## 2019-12-07 MED ORDER — SACUBITRIL-VALSARTAN 24-26 MG PO TABS
1.0000 | ORAL_TABLET | Freq: Two times a day (BID) | ORAL | Status: DC
  Administered 2019-12-07 (×2): 1 via ORAL
  Filled 2019-12-07 (×3): qty 1

## 2019-12-07 MED ORDER — METHOCARBAMOL 500 MG PO TABS
500.0000 mg | ORAL_TABLET | Freq: Four times a day (QID) | ORAL | Status: DC
  Administered 2019-12-07 – 2019-12-08 (×7): 500 mg via ORAL
  Filled 2019-12-07 (×7): qty 1

## 2019-12-07 NOTE — Plan of Care (Signed)

## 2019-12-07 NOTE — NC FL2 (Signed)
[place] MEDICAID FL2 LEVEL OF CARE SCREENING TOOL     IDENTIFICATION  Patient Name: John [name] Birthdate: [birth year] Sex: male Admission Date (Current Location): 12/03/2019  [facility] and [place] Number:  Herbalist and Address:  [facility] [place]. [facility], [place] [address] [place], [place] [address]      Provider Number: [id number]  Attending Physician Name and Address:  Geradine Girt, DO  Relative Name and Phone Number:  Maylene Roes - [phone]    Current Level of Care: Hospital Recommended Level of Care: [place] Prior Approval Number:    Date Approved/Denied:   PASRR Number: [id number]  Discharge Plan: SNF    Current Diagnoses: Patient Active Problem List   Diagnosis Date Noted  . Hypokalemia 12/06/2019  . Acute encephalopathy 12/05/2019  . CKD (chronic kidney disease), stage III 12/04/2019  . Compression fracture of T12 vertebra ([place]) 12/03/2019  . LVEF <40% 11/28/2019  . Chronic back pain 11/27/2019  . History of recent fall 10/29/2019  . 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 Dilated aortic root ([place]) 10/29/2019  . Thrombocytopenia ([place]) 10/29/2019  . Benign prostatic hyperplasia with urinary obstruction 10/24/2019  . Urinary retention 09/19/2019  . Pneumonia due to COVID-19 virus 09/11/2019  . Sepsis ([place]) 09/11/2019  . Acute ST elevation myocardial infarction (STEMI) of posterior wall ([place]) 11/11/2015  . CAD in native artery 09/17/2013  . Essential hypertension, benign 09/17/2013  . Mixed hyperlipidemia 09/17/2013    Orientation RESPIRATION BLADDER Height & Weight     Self, Time, Place  Normal External catheter Weight: 77.1 kg Height:  5\' 11"  (180.3 cm)  BEHAVIORAL SYMPTOMS/MOOD NEUROLOGICAL BOWEL NUTRITION STATUS      Incontinent Diet(See Discharge Summary)  AMBULATORY STATUS COMMUNICATION OF NEEDS Skin   Extensive Assist Verbally Skin abrasions, Bruising(bruising on Right and Left arms,  abrasions on bilateral hands)                       Personal Care Assistance Level of Assistance  Bathing, Feeding, Dressing Bathing Assistance: Maximum assistance Feeding assistance: Limited assistance Dressing Assistance: Maximum assistance     Functional Limitations Info  Sight, Hearing, Speech Sight Info: Impaired Hearing Info: Impaired Speech Info: Adequate    SPECIAL CARE FACTORS FREQUENCY  PT (By licensed PT), OT (By licensed OT)     PT Frequency: 5 times per week OT Frequency: 5 times per week            Contractures Contractures Info: Not present    Additional Factors Info  Code Status, Allergies Code Status Info: DNR Allergies Info: Statins, tape, and Xanex           Current Medications (12/07/2019):  This is the current hospital active medication list Current Facility-Administered Medications  Medication Dose Route Frequency Provider Last Rate Last Admin  . 0.9 %  sodium chloride infusion  250 mL Intravenous PRN Oswald Hillock, MD      . 0.9 %  sodium chloride infusion   Intravenous Continuous Eulogio Bear U, DO 50 mL/hr at 12/06/19 1233 Rate Change at 12/06/19 1233  . acetaminophen (TYLENOL) tablet 1,000 mg  1,000 mg Oral TID Eulogio Bear U, DO   1,000 mg at 12/06/19 2133  . aspirin EC tablet 81 mg  81 mg Oral Daily Oswald Hillock, MD   81 mg at 12/06/19 0900  . fludrocortisone (FLORINEF) tablet 100 mcg  100 mcg Oral Daily Oswald Hillock, MD  100 mcg at 12/06/19 0920  . methocarbamol (ROBAXIN) 500 mg in dextrose 5 % 50 mL IVPB  500 mg Intravenous Q8H PRN Radene Gunning, NP      . metoprolol succinate (TOPROL-XL) 24 hr tablet 25 mg  25 mg Oral BID Oswald Hillock, MD   25 mg at 12/06/19 2133  . ondansetron (ZOFRAN) tablet 4 mg  4 mg Oral Q6H PRN Oswald Hillock, MD       Or  . ondansetron (ZOFRAN) injection 4 mg  4 mg Intravenous Q6H PRN Oswald Hillock, MD      . pantoprazole (PROTONIX) EC tablet 40 mg  40 mg Oral Daily Oswald Hillock, MD   40 mg at 12/06/19  0901  . sodium chloride flush (NS) 0.9 % injection 3 mL  3 mL Intravenous Q12H Oswald Hillock, MD   3 mL at 12/06/19 1234  . sodium chloride flush (NS) 0.9 % injection 3 mL  3 mL Intravenous PRN Oswald Hillock, MD      . tamsulosin (FLOMAX) capsule 0.4 mg  0.4 mg Oral Daily Oswald Hillock, MD   0.4 mg at 12/06/19 0901     Discharge Medications: Please see discharge summary for a list of discharge medications.  Relevant Imaging Results:  Relevant Lab Results:   Additional Information SS # [id number]  Curlene Labrum, RN

## 2019-12-07 NOTE — Progress Notes (Addendum)
Physical Therapy Treatment Patient Details Name: John [name] MRN: [id number] DOB: 10/17/1929 Today's Date: 12/07/2019    History of Present Illness Pt is a 86 y.o. M with significant PMH of CAD s/p CABG, STEMI, CKD stage III, CHF, Covid 19, who presents following a fall with T12 compression fracture and superior endplate fracture at L1 and acute encephalopathy. Pt considered not a good surgical candidate by neurosurgery.    PT Comments    Pt making good progress towards his physical therapy goals this session. Initially received pt with bowel incontinence, requiring totalA for peri care and subsequent donning of clam shell brace. Pt performing functional mobility with two person minimal assist. Ambulating 50 feet with a walker and close chair follow. Continues with weakness, balance deficits, and cognitive impairments. Will benefit from post acute rehab to address deficits and maximize functional independence.     Follow Up Recommendations  SNF;Supervision/Assistance - 24 hour     Equipment Recommendations  Other (comment)(defer)    Recommendations for Other Services       Precautions / Restrictions Precautions Precautions: Fall;Back Precaution Booklet Issued: No Required Braces or Orthoses: Spinal Brace Spinal Brace: Thoracolumbosacral orthotic(Clamshell) Restrictions Weight Bearing Restrictions: No    Mobility  Bed Mobility Overal bed mobility: Needs Assistance Bed Mobility: Rolling;Sidelying to Sit Rolling: Min assist Sidelying to sit: Mod assist       General bed mobility comments: MinA for rolling to right and left for placement of TLSO. ModA for sidelying to sit with assist for trunk to upright.  Transfers Overall transfer level: Needs assistance Equipment used: Rolling walker (2 wheeled);None Transfers: Sit to/from Stand Sit to Stand: Min assist;+2 safety/equipment         General transfer comment: Standing from edge of bed with minA (+2 safety). Pt  preferring to pull up from walker despite cues.  Ambulation/Gait Ambulation/Gait assistance: Min assist;+2 safety/equipment Gait Distance (Feet): 50 Feet Assistive device: Rolling walker (2 wheeled) Gait Pattern/deviations: Step-through pattern;Decreased stride length;Narrow base of support Gait velocity: decreased   General Gait Details: Cues for wider BOS, upright posture. Close chair follow utilized. MinA for stability   Stairs             Wheelchair Mobility    Modified Rankin (Stroke Patients Only)       Balance Overall balance assessment: Needs assistance Sitting-balance support: Feet supported Sitting balance-Leahy Scale: Poor Sitting balance - Comments: Posterior lean; requires multimodal cues to correct   Standing balance support: Bilateral upper extremity supported Standing balance-Leahy Scale: Poor Standing balance comment: reliant on external support                            Cognition Arousal/Alertness: Awake/alert Behavior During Therapy: WFL for tasks assessed/performed Overall Cognitive Status: Difficult to assess Area of Impairment: Problem solving                             Problem Solving: Difficulty sequencing;Requires verbal cues;Requires tactile cues General Comments: Pt extremely HOH, has difficulty with problem solving.      Exercises      General Comments        Pertinent Vitals/Pain Pain Assessment: Faces Faces Pain Scale: Hurts little more Pain Location: back, L flank Pain Descriptors / Indicators: Grimacing;Guarding Pain Intervention(s): Limited activity within patient's tolerance;Monitored during session    Home Living Family/patient expects to be discharged to:: Unsure Living Arrangements: Alone  Prior Function            PT Goals (current goals can now be found in the care plan section) Acute Rehab PT Goals Patient Stated Goal: walk PT Goal Formulation: With  patient Time For Goal Achievement: 12/20/19 Potential to Achieve Goals: Fair Progress towards PT goals: Progressing toward goals    Frequency    Min 3X/week      PT Plan Current plan remains appropriate    Co-evaluation              AM-PAC PT "6 Clicks" Mobility   Outcome Measure  Help needed turning from your back to your side while in a flat bed without using bedrails?: A Little Help needed moving from lying on your back to sitting on the side of a flat bed without using bedrails?: A Lot Help needed moving to and from a bed to a chair (including a wheelchair)?: A Little Help needed standing up from a chair using your arms (e.g., wheelchair or bedside chair)?: A Little Help needed to walk in hospital room?: A Little Help needed climbing 3-5 steps with a railing? : A Lot 6 Click Score: 16    End of Session Equipment Utilized During Treatment: Gait belt;Back brace Activity Tolerance: Patient tolerated treatment well Patient left: in chair;with call bell/phone within reach;with chair alarm set;with family/visitor present Nurse Communication: Mobility status;Need for lift equipment PT Visit Diagnosis: Unsteadiness on feet (R26.81);Muscle weakness (generalized) (M62.81);History of falling (Z91.81);Difficulty in walking, not elsewhere classified (R26.2);Pain Pain - part of body: (back)     Time: 4562-5638 PT Time Calculation (min) (ACUTE ONLY): 54 min  Charges:  $Gait Training: 8-22 mins $Therapeutic Activity: 8-22 mins                       Wyona Almas, PT, DPT Acute Rehabilitation Services Pager [phone] Office [phone]    Deno Etienne 12/07/2019, 2:03 PM

## 2019-12-07 NOTE — Progress Notes (Signed)
Occupational Therapy Evaluation Patient Details Name: John [name] MRN: [id number] DOB: 04/04/1930 Today's Date: 12/07/2019    History of Present Illness Pt is a 86 y.o. M with significant PMH of CAD s/p CABG, STEMI, CKD stage III, CHF, Covid 19, who presents following a fall with T12 compression fracture and superior endplate fracture at L1 and acute encephalopathy. Pt considered not a good surgical candidate by neurosurgery.   Clinical Impression   Prior to hospitalization, pt was living alone in a 1 level house with 3 steps to enter. Pt's daughter and granddaughter live nearby and assisted him as needed. According to his daughter/granddaughter- in the last month or so pt has required higher level of supervision/assistance with all ADLs/IADLs/functional mobility. Pt has a history of falling and poor safety awareness. Pt is HOH, therefore poor historian. Pt admitted for above and limited by increased pain, decreased balance/safety awareness/strength/activity tolerance. Today, pt received semi-reclined in bed, with daughter/granddaughter present halfway through. Pt requires min assist for rolling and total assist for donning/adjusting TLSO in supine. Pt had BM in bed (unaware) and required total assist for perianal care in side lying position. Pt requires mod assist to transition to sitting EOB, min assist x2 for functional transfers and ambulation secondary to safety/balance/line management. Pt requires mod assist for UB dressing and setup for grooming in sitting. Pt ambulated throughout bedroom with min assist x2 utilizing RW for support. Pt setup to eat lunch in recliner at end of OT eval. Pt would benefit from skilled OT services within a SNF setting. Will continue to follow pt acutely as able.     Follow Up Recommendations  SNF;Supervision/Assistance - 24 hour    Equipment Recommendations  (defer to SNF; educated family on tub transfer bench option)    Recommendations for Other Services        Precautions / Restrictions Precautions Precautions: Fall;Back Precaution Booklet Issued: No Required Braces or Orthoses: Spinal Brace Spinal Brace: Thoracolumbosacral orthotic(wear when mobilizing out of bed) Restrictions Weight Bearing Restrictions: No      Mobility Bed Mobility Overal bed mobility: Needs Assistance Bed Mobility: Rolling;Sidelying to Sit Rolling: Min assist Sidelying to sit: Mod assist       General bed mobility comments: MinA for rolling to right and left for placement of TLSO. ModA for sidelying to sit with assist for trunk to upright.  Transfers Overall transfer level: Needs assistance Equipment used: Rolling walker (2 wheeled) Transfers: Sit to/from Stand Sit to Stand: Min assist;+2 safety/equipment         General transfer comment: Standing from edge of bed with minA (+2 safety). Pt preferring to pull up from walker despite cues.    Balance Overall balance assessment: Needs assistance Sitting-balance support: Feet supported Sitting balance-Leahy Scale: Fair Sitting balance - Comments: Posterior lean; requires multimodal cues to correct   Standing balance support: Bilateral upper extremity supported;During functional activity Standing balance-Leahy Scale: Poor Standing balance comment: reliant on external support                           ADL either performed or assessed with clinical judgement   ADL Overall ADL's : Needs assistance/impaired Eating/Feeding: Set up;Supervision/ safety;Sitting   Grooming: Wash/dry hands;Wash/dry face;Set up;Sitting   Upper Body Bathing: Moderate assistance;Sitting   Lower Body Bathing: Maximal assistance;Sitting/lateral leans;Sit to/from stand   Upper Body Dressing : Moderate assistance;Sitting   Lower Body Dressing: Maximal assistance;Sitting/lateral leans;Sit to/from stand   Toilet Transfer: Minimal assistance;+2 for  safety/equipment;Ambulation;RW;Comfort height toilet   Toileting-  Clothing Manipulation and Hygiene: Total assistance;Bed level   Tub/ Shower Transfer: (NA)   Functional mobility during ADLs: Minimal assistance;+2 for safety/equipment;Rolling walker General ADL Comments: pt requires mod-max assist with most BADLs secondary to back precautions and HOH; pt is able to perform groominng and feeding/eating with setup      Vision Baseline Vision/History: No visual deficits       Perception     Praxis      Pertinent Vitals/Pain Pain Assessment: Faces Faces Pain Scale: Hurts little more Pain Location: back, L flank Pain Descriptors / Indicators: Grimacing;Guarding Pain Intervention(s): Monitored during session;Limited activity within patient's tolerance;Repositioned     Hand Dominance     Extremity/Trunk Assessment Upper Extremity Assessment Upper Extremity Assessment: Overall WFL for tasks assessed   Lower Extremity Assessment Lower Extremity Assessment: Defer to PT evaluation   Cervical / Trunk Assessment Cervical / Trunk Assessment: Other exceptions Cervical / Trunk Exceptions: T12 compression fx   Communication Communication Communication: HOH   Cognition Arousal/Alertness: Awake/alert Behavior During Therapy: WFL for tasks assessed/performed Overall Cognitive Status: Impaired/Different from baseline Area of Impairment: Following commands;Safety/judgement;Awareness;Problem solving                       Following Commands: Follows one step commands consistently Safety/Judgement: Decreased awareness of deficits   Problem Solving: Difficulty sequencing;Requires verbal cues;Requires tactile cues General Comments: Pt extremely HOH, has difficulty with problem solving.   General Comments  pt HOH limiting sequencing/ability to follow commands; pt uncomfortable in TLSO while sitting; may need TLSO adjusted to prevent brace from applying pressure to neck while sitting    Exercises     Shoulder Instructions      Home Living  Family/patient expects to be discharged to:: Private residence Living Arrangements: Alone Available Help at Discharge: Family;Available PRN/intermittently Type of Home: House Home Access: Stairs to enter [employer] of Steps: 3 Entrance Stairs-Rails: Can reach both Home Layout: One level     Bathroom Shower/Tub: Tub/shower unit         Home Equipment: Environmental consultant - 2 wheels;Bedside commode;Shower seat;Cane - single point   Additional Comments: Patient very hard of hearing and a poor historian; daughter/granddaughter present to answer questions      Prior Functioning/Environment Level of Independence: Independent with assistive device(s)        Comments: household ambulator with RW; pt used to be Mod I with BADLs however in the last month or so pt has required min-mod assist with BADLs         OT Problem List: Decreased strength;Decreased activity tolerance;Impaired balance (sitting and/or standing);Decreased safety awareness;Decreased knowledge of use of DME or AE;Pain      OT Treatment/Interventions: Self-care/ADL training;Therapeutic exercise;Energy conservation;DME and/or AE instruction;Therapeutic activities;Patient/family education    OT Goals(Current goals can be found in the care plan section) Acute Rehab OT Goals Patient Stated Goal: walk and return home  OT Frequency: Min 2X/week   Barriers to D/C:            Co-evaluation              AM-PAC OT "6 Clicks" Daily Activity     Outcome Measure Help from another person eating meals?: None Help from another person taking care of personal grooming?: A Little Help from another person toileting, which includes using toliet, bedpan, or urinal?: Total Help from another person bathing (including washing, rinsing, drying)?: A Lot Help from another person to put  on and taking off regular upper body clothing?: A Lot Help from another person to put on and taking off regular lower body clothing?: A Lot 6  Click Score: 14   End of Session Equipment Utilized During Treatment: Gait belt;Rolling walker;Back brace Nurse Communication: Mobility status;Precautions  Activity Tolerance: Patient tolerated treatment well Patient left: in chair;with call bell/phone within reach;with family/visitor present;with chair alarm set  OT Visit Diagnosis: Unsteadiness on feet (R26.81);History of falling (Z91.81);Pain Pain - Right/Left: Left Pain - part of body: (abdomen)                Time: 5883-2549 OT Time Calculation (min): 67 min Charges:  OT General Charges $OT Visit: 1 Visit OT Evaluation $OT Eval Moderate Complexity: 1 Mod OT Treatments $Self Care/Home Management : 8-22 mins  Michel Bickers, OTR/L [facility] Acute Rehab Services [phone]   Francesca Jewett 12/07/2019, 6:28 PM

## 2019-12-07 NOTE — Progress Notes (Signed)
Progress Note    John [name]  RUE:[id number] DOB: 09/03/1929  DOA: 12/03/2019 PCP: Annie Main, FNP    Brief Narrative:     Medical records reviewed and are as summarized below:  John [name] is an 86 y.o. male with a past medical history that includes CAD status post CABG, STEMI in 2017, chronic kidney disease stage III, chronic combined systolic diastolic heart failure, ischemic cardiomyopathy with an EF of 30%, hypertension, chronic thrombocytopenia, hyperlipidemia, Covid + March 2021 transferred to [facility] on June 1 from [facility] as recommended by neurosurgery for evaluation of compression fracture of T10 and T12.  MRI was obtained he was evaluated by neurosurgery who opined not a surgical candidate.  Recommended mobilization with TSLO brace. Plan for SNF.  Assessment/Plan:   Principal Problem:   Compression fracture of T12 vertebra (HCC) Active Problems:   CAD in native artery   Essential hypertension, benign   Chronic back pain   CKD (chronic kidney disease), stage III   Acute encephalopathy   Hypokalemia   Compression fracture of T10 and T12.  Related to a fall.  Patient had been placed on Flexeril for chronic pain and Bactrim for UTI prior to admission.  Evaluated by neurosurgery who opined T12 compression fracture with 9 mm retropulsion without cord compression and not a good surgical candidate.  Recommended TSLO brace when out of bed and mobilization. -PT recommendations: SNF   Acute encephalopathy.  Family reports patient began with hallucinations and worsening confusion prior to admission.  Baseline is mild to moderate dementia however he lives alone and manages his own affairs with close supervision.  -resolved and appears back to baseline  Combined systolic/diastolic heart failure/ischemic cardiomyopathy.  Provided with gentle IV fluids yesterday as he appeared a little dry.  Echo in December 2020 revealed an EF of 25 to 30%.  Home  medications include Entresto, Lasix, metoprolol. -Continue to hold Lasix/aldactone and restart entresto for now -Monitor intake and output -Obtain daily weights  Hypertension. Fair control.  -resume meds as able  CAD status post CABG.  No chest pain.  EKG with a left bundle branch block similar to previous.  Home medications include aspirin, Plavix and metoprolol. -Continue aspirin  Chronic kidney disease stage IIIb.  Creatinine 0.99 this morning.  Voiding freely. -Monitor urine output -Hold nephrotoxins as able   hypokalemia.   -repleted  Family Communication/Anticipated D/C date and plan/Code Status   DVT prophylaxis: scd Code Status: dnr Family Communication: daughter at bedside 6/3 Disposition Plan: Status is: Inpatient  Remains inpatient appropriate because:Unsafe d/c plan   Dispo: The patient is from: Home              Anticipated d/c is to: SNF              Anticipated d/c date is: 1 day              Patient currently is medically stable to d/c.         Medical Consultants:    NS  Subjective:   Ate a good breakfast Pain when moving otherwise controlled  Objective:    Vitals:   12/06/19 2010 12/06/19 2130 12/07/19 0455 12/07/19 0847  BP: 128/72 (!) 146/82 (!) 155/86 136/74  Pulse: 72 66 66 79  Resp: 16  16 15   Temp: 97.8 F (36.6 C)  (!) 97.5 F (36.4 C) 97.7 F (36.5 C)  TempSrc:   Oral Oral  SpO2: 97%  95%  94%  Weight:      Height:        Intake/Output Summary (Last 24 hours) at 12/07/2019 5993 Last data filed at 12/07/2019 0455 Gross per 24 hour  Intake 120 ml  Output 700 ml  Net -580 ml   Filed Weights   12/03/19 1851  Weight: 77.1 kg    Exam:  General: Appearance:    Well developed, well nourished male in no acute distress   Speech chronically slurred and difficult to understand, hearing aide in right ear so he was able to hear better  Lungs:     Clear to auscultation bilaterally, respirations unlabored  Heart:    Normal  heart rate. Normal rhythm. No murmurs, rubs, or gallops.   MS:   All extremities are intact.   Neurologic:   Awake, alert, oriented x 3     Data Reviewed:   I have personally reviewed following labs and imaging studies:  Labs: Labs show the following:   Basic Metabolic Panel: Recent Labs  Lab 12/03/19 2022 12/03/19 2022 12/04/19 0934 12/04/19 0934 12/05/19 1525 12/05/19 1525 12/06/19 0449 12/07/19 0729  NA 139  --  142  --  140  --  139 140  K 3.5   < > 3.8   < > 3.4*   < > 3.1* 3.8  CL 105  --  106  --  106  --  108 109  CO2 24  --  24  --  24  --  22 20*  GLUCOSE 114*  --  89  --  81  --  73 71  BUN 28*  --  25*  --  23  --  22 17  CREATININE 1.05  --  1.17  --  1.10  --  0.99 0.91  CALCIUM 9.1  --  9.4  --  9.0  --  8.4* 8.7*  MG 2.0  --   --   --   --   --  1.9  --    < > = values in this interval not displayed.   GFR Estimated Creatinine Clearance: 60.9 mL/min (by C-G formula based on SCr of 0.91 mg/dL). Liver Function Tests: Recent Labs  Lab 12/03/19 2022 12/04/19 0934  AST 48* 41  ALT 33 32  ALKPHOS 73 68  BILITOT 3.7* 3.5*  PROT 6.2* 5.8*  ALBUMIN 3.5 3.4*   No results for input(s): LIPASE, AMYLASE in the last 168 hours. No results for input(s): AMMONIA in the last 168 hours. Coagulation profile No results for input(s): INR, PROTIME in the last 168 hours.  CBC: Recent Labs  Lab 12/03/19 2022 12/04/19 0934 12/06/19 0449  WBC 6.4 5.6 4.6  NEUTROABS 5.1  --   --   HGB 14.3 14.0 12.9*  HCT 42.4 41.3 38.7*  MCV 95.7 95.8 96.0  PLT 91* 89* 75*   Cardiac Enzymes: Recent Labs  Lab 12/03/19 2022  CKTOTAL 715*   BNP (last 3 results) No results for input(s): PROBNP in the last 8760 hours. CBG: No results for input(s): GLUCAP in the last 168 hours. D-Dimer: No results for input(s): DDIMER in the last 72 hours. Hgb A1c: No results for input(s): HGBA1C in the last 72 hours. Lipid Profile: No results for input(s): CHOL, HDL, LDLCALC, TRIG,  CHOLHDL, LDLDIRECT in the last 72 hours. Thyroid function studies: No results for input(s): TSH, T4TOTAL, T3FREE, THYROIDAB in the last 72 hours.  Invalid input(s): FREET3 Anemia work up: No results for input(s): VITAMINB12,  FOLATE, FERRITIN, TIBC, IRON, RETICCTPCT in the last 72 hours. Sepsis Labs: Recent Labs  Lab 12/03/19 2022 12/04/19 0934 12/06/19 0449  WBC 6.4 5.6 4.6    Microbiology Recent Results (from the past 240 hour(s))  SARS Coronavirus 2 by RT PCR (hospital order, performed in [facility] hospital lab) Nasopharyngeal Nasopharyngeal Swab     Status: None   Collection Time: 12/03/19 10:20 PM   Specimen: Nasopharyngeal Swab  Result Value Ref Range Status   SARS Coronavirus 2 NEGATIVE NEGATIVE Final    Comment: (NOTE) SARS-CoV-2 target nucleic acids are NOT DETECTED. The SARS-CoV-2 RNA is generally detectable in upper and lower respiratory specimens during the acute phase of infection. The lowest concentration of SARS-CoV-2 viral copies this assay can detect is 250 copies / mL. A negative result does not preclude SARS-CoV-2 infection and should not be used as the sole basis for treatment or other patient management decisions.  A negative result may occur with improper specimen collection / handling, submission of specimen other than nasopharyngeal swab, presence of viral mutation(s) within the areas targeted by this assay, and inadequate number of viral copies (<250 copies / mL). A negative result must be combined with clinical observations, patient history, and epidemiological information. Fact Sheet for Patients:   StrictlyIdeas.no Fact Sheet for Healthcare Providers: BankingDealers.co.za This test is not yet approved or cleared  by the [place] FDA and has been authorized for detection and/or diagnosis of SARS-CoV-2 by FDA under an Emergency Use Authorization (EUA).  This EUA will remain in effect (meaning this  test can be used) for the duration of the COVID-19 declaration under Section 564(b)(1) of the Act, 21 U.S.C. section 360bbb-3(b)(1), unless the authorization is terminated or revoked sooner. Performed at [facility], [address], [place], [place] [address]   Urine Culture     Status: None   Collection Time: 12/05/19  7:39 AM   Specimen: Urine, Clean Catch  Result Value Ref Range Status   Specimen Description URINE, CLEAN CATCH  Final   Special Requests NONE  Final   Culture   Final    NO GROWTH Performed at [facility], [address], [place], [place] [address]    Report Status 12/06/2019 FINAL  Final    Procedures and diagnostic studies:  No results found.  Medications:   . acetaminophen  1,000 mg Oral TID  . aspirin EC  81 mg Oral Daily  . fludrocortisone  100 mcg Oral Daily  . metoprolol succinate  25 mg Oral BID  . pantoprazole  40 mg Oral Daily  . sodium chloride flush  3 mL Intravenous Q12H  . tamsulosin  0.4 mg Oral Daily   Continuous Infusions: . sodium chloride    . sodium chloride 50 mL/hr at 12/06/19 1233  . methocarbamol (ROBAXIN) IV       LOS: 4 days   Geradine Girt  [facility]   How to contact the [facility] Attending or Consulting provider [place] or covering provider during after hours [place], for this patient?  1. Check the care team in [facility] and look for a) attending/consulting TRH provider listed and b) the [facility] team listed 2. Log into [email or web address] and use [place]'s universal password to access. If you do not have the password, please contact the hospital operator. 3. Locate the [facility] provider you are looking for under [facility] and page to a number that you can be directly reached. 4. If you still have difficulty  reaching the provider, please page the [facility] (Director on Call) for the Hospitalists listed on amion for assistance.  12/07/2019, 9:17 AM

## 2019-12-08 MED ORDER — HALOPERIDOL LACTATE 5 MG/ML IJ SOLN
1.0000 mg | Freq: Four times a day (QID) | INTRAMUSCULAR | Status: DC | PRN
  Administered 2019-12-08: 1 mg via INTRAVENOUS
  Filled 2019-12-08: qty 1

## 2019-12-08 MED ORDER — FUROSEMIDE 20 MG PO TABS: 20.0000 mg | ORAL_TABLET | Freq: Every day | ORAL | Status: DC | PRN

## 2019-12-08 MED ORDER — SODIUM CHLORIDE 0.9 % IV SOLN: INTRAVENOUS | Status: DC

## 2019-12-08 NOTE — Plan of Care (Signed)
  Problem: Activity: Goal: Will remain free from falls Outcome: Progressing   Problem: Pain Management: Goal: Pain level will decrease Outcome: Progressing   

## 2019-12-08 NOTE — Progress Notes (Signed)
Progress Note    John [name]  VQQ:[id number] DOB: [birth year]  DOA: 12/03/2019 PCP: Annie Main, FNP    Brief Narrative:     Medical records reviewed and are as summarized below:  John [name] is an 86 y.o. male with a past medical history that includes CAD status post CABG, STEMI in 2017, chronic kidney disease stage III, chronic combined systolic diastolic heart failure, ischemic cardiomyopathy with an EF of 30%, hypertension, chronic thrombocytopenia, hyperlipidemia, Covid + March 2021 transferred to [facility] on June 1 from [facility] as recommended by neurosurgery for evaluation of compression fracture of T10 and T12.  MRI was obtained he was evaluated by neurosurgery who opined not a surgical candidate.  Recommended mobilization with TSLO brace. Plan for SNF.  Assessment/Plan:   Principal Problem:   Compression fracture of T12 vertebra (HCC) Active Problems:   CAD in native artery   Essential hypertension, benign   Chronic back pain   CKD (chronic kidney disease), stage III   Acute encephalopathy   Hypokalemia   Compression fracture of T10 and T12.  Related to a fall.  Patient had been placed on Flexeril for chronic pain and Bactrim for UTI prior to admission.  Evaluated by neurosurgery who opined T12 compression fracture with 9 mm retropulsion without cord compression and not a good surgical candidate.  Recommended TSLO brace when out of bed and mobilization. -PT recommendations: SNF   Acute encephalopathy.  Family reports patient began with hallucinations and worsening confusion prior to admission.  Baseline is mild to moderate dementia however he lives alone and manages his own affairs with close supervision.  -resolved and appears back to baseline  Combined systolic/diastolic heart failure/ischemic cardiomyopathy.  Provided with gentle IV fluids yesterday as he appeared a little dry.  Echo in December 2020 revealed an EF of 25 to 30%.  Home  medications include Entresto, Lasix, metoprolol. -with entresto patient appears to become too dehydrated.  Will use PRN lasix for now with close monitoring -Monitor intake and output -Obtain daily weights (not sure they are accurate)  Hypertension. Fair control.  -resume meds as able  CAD status post CABG.  No chest pain.  EKG with a left bundle branch block similar to previous.  Home medications include aspirin, Plavix and metoprolol. -Continue aspirin  Chronic kidney disease stage IIIb.  Creatinine 0.99 this morning.  Voiding freely. -Monitor urine output -Hold nephrotoxins as able   hypokalemia.   -repleted  Family Communication/Anticipated D/C date and plan/Code Status   DVT prophylaxis: scd Code Status: dnr Family Communication: daughter at bedside 6/3 Disposition Plan: Status is: Inpatient  Remains inpatient appropriate because:Unsafe d/c plan   Dispo: The patient is from: Home              Anticipated d/c is to: SNF              Anticipated d/c date is: 1 day              Patient currently is medically stable to d/c.         Medical Consultants:    NS  Subjective:   Mouth dry today and speech more difficult to understand  Objective:    Vitals:   12/07/19 1939 12/08/19 0418 12/08/19 0447 12/08/19 0854  BP: 125/85  (!) 161/94 (!) 157/91  Pulse: 66  73 85  Resp: 13  14 18   Temp: (!) 97.5 F (36.4 C)  97.8 F (36.6 C) 98  F (36.7 C)  TempSrc: Oral  Oral   SpO2: 97%  97% 96%  Weight:  83 kg    Height:        Intake/Output Summary (Last 24 hours) at 12/08/2019 3818 Last data filed at 12/08/2019 0700 Gross per 24 hour  Intake 480 ml  Output 1042 ml  Net -562 ml   Filed Weights   12/03/19 1851 12/08/19 0418  Weight: 77.1 kg 83 kg    Exam:  General: Appearance:     Overweight male in no acute distress, chronically ill     Lungs:     Clear to auscultation bilaterally, respirations unlabored  Heart:    Normal heart rate. Normal rhythm.  No murmurs, rubs, or gallops.   MS:   All extremities are intact.   Neurologic: Alert, speech difficult to understand     Data Reviewed:   I have personally reviewed following labs and imaging studies:  Labs: Labs show the following:   Basic Metabolic Panel: Recent Labs  Lab 12/03/19 2022 12/03/19 2022 12/04/19 0934 12/04/19 0934 12/05/19 1525 12/05/19 1525 12/06/19 0449 12/07/19 0729  NA 139  --  142  --  140  --  139 140  K 3.5   < > 3.8   < > 3.4*   < > 3.1* 3.8  CL 105  --  106  --  106  --  108 109  CO2 24  --  24  --  24  --  22 20*  GLUCOSE 114*  --  89  --  81  --  73 71  BUN 28*  --  25*  --  23  --  22 17  CREATININE 1.05  --  1.17  --  1.10  --  0.99 0.91  CALCIUM 9.1  --  9.4  --  9.0  --  8.4* 8.7*  MG 2.0  --   --   --   --   --  1.9  --    < > = values in this interval not displayed.   GFR Estimated Creatinine Clearance: 60.9 mL/min (by C-G formula based on SCr of 0.91 mg/dL). Liver Function Tests: Recent Labs  Lab 12/03/19 2022 12/04/19 0934  AST 48* 41  ALT 33 32  ALKPHOS 73 68  BILITOT 3.7* 3.5*  PROT 6.2* 5.8*  ALBUMIN 3.5 3.4*   No results for input(s): LIPASE, AMYLASE in the last 168 hours. No results for input(s): AMMONIA in the last 168 hours. Coagulation profile No results for input(s): INR, PROTIME in the last 168 hours.  CBC: Recent Labs  Lab 12/03/19 2022 12/04/19 0934 12/06/19 0449 12/07/19 0729  WBC 6.4 5.6 4.6 5.0  NEUTROABS 5.1  --   --   --   HGB 14.3 14.0 12.9* 14.2  HCT 42.4 41.3 38.7* 41.4  MCV 95.7 95.8 96.0 96.3  PLT 91* 89* 75* 82*   Cardiac Enzymes: Recent Labs  Lab 12/03/19 2022  CKTOTAL 715*   BNP (last 3 results) No results for input(s): PROBNP in the last 8760 hours. CBG: No results for input(s): GLUCAP in the last 168 hours. D-Dimer: No results for input(s): DDIMER in the last 72 hours. Hgb A1c: No results for input(s): HGBA1C in the last 72 hours. Lipid Profile: No results for input(s):  CHOL, HDL, LDLCALC, TRIG, CHOLHDL, LDLDIRECT in the last 72 hours. Thyroid function studies: No results for input(s): TSH, T4TOTAL, T3FREE, THYROIDAB in the last 72 hours.  Invalid input(s):  FREET3 Anemia work up: No results for input(s): VITAMINB12, FOLATE, FERRITIN, TIBC, IRON, RETICCTPCT in the last 72 hours. Sepsis Labs: Recent Labs  Lab 12/03/19 2022 12/04/19 0934 12/06/19 0449 12/07/19 0729  WBC 6.4 5.6 4.6 5.0    Microbiology Recent Results (from the past 240 hour(s))  SARS Coronavirus 2 by RT PCR (hospital order, performed in [facility] hospital lab) Nasopharyngeal Nasopharyngeal Swab     Status: None   Collection Time: 12/03/19 10:20 PM   Specimen: Nasopharyngeal Swab  Result Value Ref Range Status   SARS Coronavirus 2 NEGATIVE NEGATIVE Final    Comment: (NOTE) SARS-CoV-2 target nucleic acids are NOT DETECTED. The SARS-CoV-2 RNA is generally detectable in upper and lower respiratory specimens during the acute phase of infection. The lowest concentration of SARS-CoV-2 viral copies this assay can detect is 250 copies / mL. A negative result does not preclude SARS-CoV-2 infection and should not be used as the sole basis for treatment or other patient management decisions.  A negative result may occur with improper specimen collection / handling, submission of specimen other than nasopharyngeal swab, presence of viral mutation(s) within the areas targeted by this assay, and inadequate number of viral copies (<250 copies / mL). A negative result must be combined with clinical observations, patient history, and epidemiological information. Fact Sheet for Patients:   StrictlyIdeas.no Fact Sheet for Healthcare Providers: BankingDealers.co.za This test is not yet approved or cleared  by the [place] FDA and has been authorized for detection and/or diagnosis of SARS-CoV-2 by FDA under an Emergency Use Authorization (EUA).   This EUA will remain in effect (meaning this test can be used) for the duration of the COVID-19 declaration under Section 564(b)(1) of the Act, 21 U.S.C. section 360bbb-3(b)(1), unless the authorization is terminated or revoked sooner. Performed at [facility], [address], [place], [place] [address]   Urine Culture     Status: None   Collection Time: 12/05/19  7:39 AM   Specimen: Urine, Clean Catch  Result Value Ref Range Status   Specimen Description URINE, CLEAN CATCH  Final   Special Requests NONE  Final   Culture   Final    NO GROWTH Performed at [facility], [address], [place], [place] [address]    Report Status 12/06/2019 FINAL  Final    Procedures and diagnostic studies:  No results found.  Medications:   . acetaminophen  1,000 mg Oral TID  . aspirin EC  81 mg Oral Daily  . fludrocortisone  100 mcg Oral Daily  . methocarbamol  500 mg Oral QID  . metoprolol succinate  25 mg Oral BID  . pantoprazole  40 mg Oral Daily  . sodium chloride flush  3 mL Intravenous Q12H  . tamsulosin  0.4 mg Oral Daily   Continuous Infusions: . sodium chloride       LOS: 5 days   Geradine Girt  [facility]   How to contact the [facility] Attending or Consulting provider [place] or covering provider during after hours [place], for this patient?  1. Check the care team in [facility] and look for a) attending/consulting TRH provider listed and b) the [facility] team listed 2. Log into [email or web address] and use [place]'s universal password to access. If you do not have the password, please contact the hospital operator. 3. Locate the [facility] provider you are looking for under [facility] and page to a number that you can be directly reached. 4. If  you still have difficulty reaching the provider, please page the [facility] (Director on Call) for the Hospitalists listed on amion for assistance.  12/08/2019, 9:23 AM

## 2019-12-09 LAB — CBC
HCT: 40.5 % (ref 39.0–52.0)
Hemoglobin: 13.8 g/dL (ref 13.0–17.0)
MCH: 32.1 pg (ref 26.0–34.0)
MCHC: 34.1 g/dL (ref 30.0–36.0)
MCV: 94.2 fL (ref 80.0–100.0)
Platelets: 87 10*3/uL — ABNORMAL LOW (ref 150–400)
RBC: 4.3 MIL/uL (ref 4.22–5.81)
RDW: 13.3 % (ref 11.5–15.5)
WBC: 3.9 10*3/uL — ABNORMAL LOW (ref 4.0–10.5)
nRBC: 0 % (ref 0.0–0.2)

## 2019-12-09 LAB — BASIC METABOLIC PANEL
Anion gap: 9 (ref 5–15)
BUN: 9 mg/dL (ref 8–23)
CO2: 25 mmol/L (ref 22–32)
Calcium: 8.8 mg/dL — ABNORMAL LOW (ref 8.9–10.3)
Chloride: 108 mmol/L (ref 98–111)
Creatinine, Ser: 0.99 mg/dL (ref 0.61–1.24)
GFR calc Af Amer: >60 mL/min (ref >60)
GFR calc non Af Amer: >60 mL/min (ref >60)
Glucose, Bld: 80 mg/dL (ref 70–99)
Potassium: 3.5 mmol/L (ref 3.5–5.1)
Sodium: 142 mmol/L (ref 135–145)

## 2019-12-09 NOTE — Plan of Care (Signed)
  Problem: Activity: Goal: Ability to avoid complications of mobility impairment will improve Outcome: Progressing   Problem: Pain Management: Goal: Pain level will decrease Outcome: Progressing   Problem: Skin Integrity: Goal: Will show signs of wound healing Outcome: Progressing   Problem: Health Behavior/Discharge Planning: Goal: Identification of resources available to assist in meeting health care needs will improve Outcome: Progressing   Problem: Bladder/Genitourinary: Goal: Urinary functional status for postoperative course will improve Outcome: Progressing

## 2019-12-09 NOTE — Plan of Care (Signed)
  Problem: Education: Goal: Ability to verbalize activity precautions or restrictions will improve Outcome: Progressing   Problem: Activity: Goal: Ability to avoid complications of mobility impairment will improve Outcome: Progressing   Problem: Clinical Measurements: Goal: Ability to maintain clinical measurements within normal limits will improve Outcome: Progressing   Problem: Pain Management: Goal: Pain level will decrease Outcome: Progressing   Problem: Skin Integrity: Goal: Will show signs of wound healing Outcome: Progressing   Problem: Bladder/Genitourinary: Goal: Urinary functional status for postoperative course will improve Outcome: Progressing   Problem: Clinical Measurements: Goal: Ability to maintain clinical measurements within normal limits will improve Outcome: Progressing

## 2019-12-09 NOTE — Plan of Care (Signed)
  Problem: Education: Goal: Ability to verbalize activity precautions or restrictions will improve Outcome: Progressing Goal: Knowledge of the prescribed therapeutic regimen will improve Outcome: Progressing Goal: Understanding of discharge needs will improve Outcome: Progressing   

## 2019-12-09 NOTE — Progress Notes (Signed)
Progress Note    John [name]  CLE:[id number] DOB: [birth year]  DOA: 12/03/2019 PCP: Annie Main, FNP    Brief Narrative:     Medical records reviewed and are as summarized below:  John [name] is an 86 y.o. male with a past medical history that includes CAD status post CABG, STEMI in 2017, chronic kidney disease stage III, chronic combined systolic diastolic heart failure, ischemic cardiomyopathy with an EF of 30%, hypertension, chronic thrombocytopenia, hyperlipidemia, Covid + March 2021 transferred to [facility] on June 1 from [facility] as recommended by neurosurgery for evaluation of compression fracture of T10 and T12.  MRI was obtained he was evaluated by neurosurgery who opined not a surgical candidate.  Recommended mobilization with TSLO brace. Plan for SNF.  Assessment/Plan:   Principal Problem:   Compression fracture of T12 vertebra (HCC) Active Problems:   CAD in native artery   Essential hypertension, benign   Chronic back pain   CKD (chronic kidney disease), stage III   Acute encephalopathy   Hypokalemia   Compression fracture of T10 and T12.  Related to a fall.  Patient had been placed on Flexeril for chronic pain and Bactrim for UTI prior to admission.  Evaluated by neurosurgery who opined T12 compression fracture with 9 mm retropulsion without cord compression and not a good surgical candidate.  Recommended TSLO brace when out of bed and mobilization. -PT recommendations: SNF   Acute encephalopathy/deliriurm -has episodes of hallucinations-- ? lewy body dementia vs delirium (was ahhpening prior to admission)  Combined systolic/diastolic heart failure/ischemic cardiomyopathy.  Provided with gentle IV fluids yesterday as he appeared a little dry.  Echo in December 2020 revealed an EF of 25 to 30%.  Home medications include Entresto, Lasix, metoprolol. -with entresto patient appears to become too dehydrated.  Will use PRN lasix for now with close  monitoring -Monitor intake and output -Obtain daily weights (not sure they are accurate)  Hypertension. Fair control.  -resume meds as able  CAD status post CABG.  No chest pain.  EKG with a left bundle branch block similar to previous.  Home medications include aspirin, Plavix and metoprolol. -Continue aspirin  Chronic kidney disease stage IIIb.  Creatinine 0.99 this morning.  Voiding freely. -Monitor urine output -Hold nephrotoxins as able   hypokalemia.   -repleted  Family Communication/Anticipated D/C date and plan/Code Status   DVT prophylaxis: scd Code Status: dnr Family Communication: daughter at bedside 6/3 Disposition Plan: Status is: Inpatient  Remains inpatient appropriate because:Unsafe d/c plan   Dispo: The patient is from: Home              Anticipated d/c is to: SNF              Anticipated d/c date is: 1 day              Patient currently is medically stable to d/c.         Medical Consultants:    NS  Subjective:   Able to answer questions appropriately but is hallucinating  Objective:    Vitals:   12/08/19 1944 12/09/19 0500 12/09/19 0644 12/09/19 0812  BP: (!) 165/85  (!) 163/88 (!) 194/95  Pulse: 87  78 77  Resp: 12  14 18   Temp: 98.6 F (37 C)  97.8 F (36.6 C) 98 F (36.7 C)  TempSrc: Oral  Oral   SpO2: 98%  96% 94%  Weight:  75.5 kg    Height:  Intake/Output Summary (Last 24 hours) at 12/09/2019 1344 Last data filed at 12/09/2019 1000 Gross per 24 hour  Intake 891.27 ml  Output 1400 ml  Net -508.73 ml   Filed Weights   12/03/19 1851 12/08/19 0418 12/09/19 0500  Weight: 77.1 kg 83 kg 75.5 kg    Exam:   General: Appearance:    Frail, elderly male in no acute distress     Lungs:     Clear to auscultation bilaterally, respirations unlabored  Heart:    Normal heart rate. Normal rhythm. No murmurs, rubs, or gallops.   MS:   All extremities are intact.   Neurologic:   Able to answer questions appropriately but  also endorses hallucinations (seeing ppl/bugs/fire)      Data Reviewed:   I have personally reviewed following labs and imaging studies:  Labs: Labs show the following:   Basic Metabolic Panel: Recent Labs  Lab 12/03/19 2022 12/03/19 2022 12/04/19 0934 12/04/19 0934 12/05/19 1525 12/05/19 1525 12/06/19 0449 12/06/19 0449 12/07/19 0729 12/09/19 0303  NA 139   < > 142  --  140  --  139  --  140 142  K 3.5   < > 3.8   < > 3.4*   < > 3.1*   < > 3.8 3.5  CL 105   < > 106  --  106  --  108  --  109 108  CO2 24   < > 24  --  24  --  22  --  20* 25  GLUCOSE 114*   < > 89  --  81  --  73  --  71 80  BUN 28*   < > 25*  --  23  --  22  --  17 9  CREATININE 1.05   < > 1.17  --  1.10  --  0.99  --  0.91 0.99  CALCIUM 9.1   < > 9.4  --  9.0  --  8.4*  --  8.7* 8.8*  MG 2.0  --   --   --   --   --  1.9  --   --   --    < > = values in this interval not displayed.   GFR Estimated Creatinine Clearance: 56 mL/min (by C-G formula based on SCr of 0.99 mg/dL). Liver Function Tests: Recent Labs  Lab 12/03/19 2022 12/04/19 0934  AST 48* 41  ALT 33 32  ALKPHOS 73 68  BILITOT 3.7* 3.5*  PROT 6.2* 5.8*  ALBUMIN 3.5 3.4*   No results for input(s): LIPASE, AMYLASE in the last 168 hours. No results for input(s): AMMONIA in the last 168 hours. Coagulation profile No results for input(s): INR, PROTIME in the last 168 hours.  CBC: Recent Labs  Lab 12/03/19 2022 12/04/19 0934 12/06/19 0449 12/07/19 0729 12/09/19 0303  WBC 6.4 5.6 4.6 5.0 3.9*  NEUTROABS 5.1  --   --   --   --   HGB 14.3 14.0 12.9* 14.2 13.8  HCT 42.4 41.3 38.7* 41.4 40.5  MCV 95.7 95.8 96.0 96.3 94.2  PLT 91* 89* 75* 82* 87*   Cardiac Enzymes: Recent Labs  Lab 12/03/19 2022  CKTOTAL 715*   BNP (last 3 results) No results for input(s): PROBNP in the last 8760 hours. CBG: No results for input(s): GLUCAP in the last 168 hours. D-Dimer: No results for input(s): DDIMER in the last 72 hours. Hgb A1c: No  results for input(s): HGBA1C  in the last 72 hours. Lipid Profile: No results for input(s): CHOL, HDL, LDLCALC, TRIG, CHOLHDL, LDLDIRECT in the last 72 hours. Thyroid function studies: No results for input(s): TSH, T4TOTAL, T3FREE, THYROIDAB in the last 72 hours.  Invalid input(s): FREET3 Anemia work up: No results for input(s): VITAMINB12, FOLATE, FERRITIN, TIBC, IRON, RETICCTPCT in the last 72 hours. Sepsis Labs: Recent Labs  Lab 12/04/19 0934 12/06/19 0449 12/07/19 0729 12/09/19 0303  WBC 5.6 4.6 5.0 3.9*    Microbiology Recent Results (from the past 240 hour(s))  SARS Coronavirus 2 by RT PCR (hospital order, performed in [facility] hospital lab) Nasopharyngeal Nasopharyngeal Swab     Status: None   Collection Time: 12/03/19 10:20 PM   Specimen: Nasopharyngeal Swab  Result Value Ref Range Status   SARS Coronavirus 2 NEGATIVE NEGATIVE Final    Comment: (NOTE) SARS-CoV-2 target nucleic acids are NOT DETECTED. The SARS-CoV-2 RNA is generally detectable in upper and lower respiratory specimens during the acute phase of infection. The lowest concentration of SARS-CoV-2 viral copies this assay can detect is 250 copies / mL. A negative result does not preclude SARS-CoV-2 infection and should not be used as the sole basis for treatment or other patient management decisions.  A negative result may occur with improper specimen collection / handling, submission of specimen other than nasopharyngeal swab, presence of viral mutation(s) within the areas targeted by this assay, and inadequate number of viral copies (<250 copies / mL). A negative result must be combined with clinical observations, patient history, and epidemiological information. Fact Sheet for Patients:   StrictlyIdeas.no Fact Sheet for Healthcare Providers: BankingDealers.co.za This test is not yet approved or cleared  by the [place] FDA and has been authorized for  detection and/or diagnosis of SARS-CoV-2 by FDA under an Emergency Use Authorization (EUA).  This EUA will remain in effect (meaning this test can be used) for the duration of the COVID-19 declaration under Section 564(b)(1) of the Act, 21 U.S.C. section 360bbb-3(b)(1), unless the authorization is terminated or revoked sooner. Performed at [facility], [address] [place], [place] [address]   Urine Culture     Status: None   Collection Time: 12/05/19  7:39 AM   Specimen: Urine, Clean Catch  Result Value Ref Range Status   Specimen Description URINE, CLEAN CATCH  Final   Special Requests NONE  Final   Culture   Final    NO GROWTH Performed at [facility], [address] [place], [place] [address]    Report Status 12/06/2019 FINAL  Final    Procedures and diagnostic studies:  No results found.  Medications:   . acetaminophen  1,000 mg Oral TID  . aspirin EC  81 mg Oral Daily  . fludrocortisone  100 mcg Oral Daily  . metoprolol succinate  25 mg Oral BID  . pantoprazole  40 mg Oral Daily  . sodium chloride flush  3 mL Intravenous Q12H  . tamsulosin  0.4 mg Oral Daily   Continuous Infusions: . sodium chloride       LOS: 6 days   Geradine Girt  [facility]   How to contact the [facility] Attending or Consulting provider [place] or covering provider during after hours [place], for this patient?  1. Check the care team in [facility] and look for a) attending/consulting TRH provider listed and b) the [facility] team listed 2. Log into [email or web address] and use [place]'s universal password to access. If you do not  have the password, please contact the hospital operator. 3. Locate the [facility] provider you are looking for under [facility] and page to a number that you can be directly reached. 4. If you still have difficulty reaching the provider, please page the [facility] (Director on Call) for the Hospitalists listed on amion for assistance.  12/09/2019, 1:44 PM

## 2019-12-10 DIAGNOSIS — E876 Hypokalemia: Secondary | ICD-10-CM

## 2019-12-10 LAB — VITAMIN B12: Vitamin B-12: 401 pg/mL (ref 180–914)

## 2019-12-10 LAB — SARS CORONAVIRUS 2 (TAT 6-24 HRS): SARS Coronavirus 2: NEGATIVE

## 2019-12-10 NOTE — Plan of Care (Signed)
  Problem: Education: Goal: Ability to verbalize activity precautions or restrictions will improve Outcome: Not Progressing Goal: Knowledge of the prescribed therapeutic regimen will improve Outcome: Not Progressing Goal: Understanding of discharge needs will improve Outcome: Not Progressing  Patient is confused

## 2019-12-10 NOTE — Progress Notes (Signed)
Physical Therapy Treatment Patient Details Name: John [name] MRN: [id number] DOB: [birth year] Today's Date: 12/10/2019    History of Present Illness Pt is a 86 y.o. M with significant PMH of CAD s/p CABG, STEMI, CKD stage III, CHF, Covid 19, who presents following a fall with T12 compression fracture and superior endplate fracture at L1 and acute encephalopathy. Pt considered not a good surgical candidate by neurosurgery.    PT Comments    Pt progressing towards physical therapy goals. Was able to perform transfers and ambulation with up to +2 min assist (+2 mainly for safety and chair follow). Pt with many complaints regarding brace fit when sitting. Noted brace elevated and into neck. Pt is kyphotic and with forward head posture as well, which is also contributing to ill fit in sitting. PT called [facility] to touch base regarding brace fit, and they are to come look at it for the pt. SNF level rehab remains appropriate. Will continue to follow.    Follow Up Recommendations  SNF;Supervision/Assistance - 24 hour     Equipment Recommendations  (TBD by next venue of care)    Recommendations for Other Services       Precautions / Restrictions Precautions Precautions: Fall;Back Precaution Booklet Issued: No Required Braces or Orthoses: Spinal Brace Spinal Brace: Thoracolumbosacral orthotic(clamshell, wear when mobilizing out of bed) Restrictions Weight Bearing Restrictions: No    Mobility  Bed Mobility Overal bed mobility: Needs Assistance Bed Mobility: Rolling;Sidelying to Sit Rolling: Min assist Sidelying to sit: Mod assist       General bed mobility comments: MinA for rolling to right and left for placement of TLSO. ModA for sidelying to sit with assist for trunk to upright.  Transfers Overall transfer level: Needs assistance Equipment used: Rolling walker (2 wheeled) Transfers: Sit to/from Stand Sit to Stand: Min assist;+2 safety/equipment         General transfer  comment: Standing from edge of bed with minA (+2 safety). VC's for hand placement on seated surface for safety.   Ambulation/Gait Ambulation/Gait assistance: Min assist;+2 safety/equipment Gait Distance (Feet): 200 Feet Assistive device: Rolling walker (2 wheeled) Gait Pattern/deviations: Step-through pattern;Decreased stride length;Narrow base of support Gait velocity: decreased Gait velocity interpretation: <1.8 ft/sec, indicate of risk for recurrent falls General Gait Details: VC's for improved posture and closer proximity to walker. Chair follow utilized for safety however pt motivated for distance.    Stairs             Wheelchair Mobility    Modified Rankin (Stroke Patients Only)       Balance Overall balance assessment: Needs assistance Sitting-balance support: Feet supported Sitting balance-Leahy Scale: Fair Sitting balance - Comments: Posterior lean; requires multimodal cues to correct   Standing balance support: Bilateral upper extremity supported;During functional activity Standing balance-Leahy Scale: Poor Standing balance comment: reliant on external support                            Cognition Arousal/Alertness: Awake/alert Behavior During Therapy: WFL for tasks assessed/performed Overall Cognitive Status: Impaired/Different from baseline Area of Impairment: Following commands;Safety/judgement;Awareness;Problem solving                       Following Commands: Follows one step commands consistently Safety/Judgement: Decreased awareness of deficits   Problem Solving: Difficulty sequencing;Requires verbal cues;Requires tactile cues General Comments: Pt extremely HOH, has difficulty with problem solving.      Exercises  General Comments        Pertinent Vitals/Pain Pain Assessment: Faces Faces Pain Scale: Hurts little more Pain Location: back, L flank Pain Descriptors / Indicators: Grimacing;Guarding Pain  Intervention(s): Limited activity within patient's tolerance;Monitored during session;Repositioned    Home Living                      Prior Function            PT Goals (current goals can now be found in the care plan section) Acute Rehab PT Goals Patient Stated Goal: walk and return home PT Goal Formulation: With patient/family Time For Goal Achievement: 12/20/19 Potential to Achieve Goals: Fair Progress towards PT goals: Progressing toward goals    Frequency    Min 3X/week      PT Plan Current plan remains appropriate    Co-evaluation              AM-PAC PT "6 Clicks" Mobility   Outcome Measure  Help needed turning from your back to your side while in a flat bed without using bedrails?: A Little Help needed moving from lying on your back to sitting on the side of a flat bed without using bedrails?: A Lot Help needed moving to and from a bed to a chair (including a wheelchair)?: A Little Help needed standing up from a chair using your arms (e.g., wheelchair or bedside chair)?: A Little Help needed to walk in hospital room?: A Little Help needed climbing 3-5 steps with a railing? : A Lot 6 Click Score: 16    End of Session Equipment Utilized During Treatment: Gait belt;Back brace Activity Tolerance: Patient tolerated treatment well Patient left: in chair;with call bell/phone within reach;with chair alarm set;with family/visitor present Nurse Communication: Mobility status;Need for lift equipment PT Visit Diagnosis: Unsteadiness on feet (R26.81);Muscle weakness (generalized) (M62.81);History of falling (Z91.81);Difficulty in walking, not elsewhere classified (R26.2);Pain Pain - part of body: (back)     Time: 2595-6387 PT Time Calculation (min) (ACUTE ONLY): 28 min  Charges:  $Gait Training: 23-37 mins                     [name], PT, DPT Acute Rehabilitation Services Pager: [phone] Office: [phone]    [name] 12/10/2019, 2:23 PM

## 2019-12-10 NOTE — TOC Transition Note (Addendum)
Transition of Care [facility]) - CM/SW Discharge Note   Patient Details  Name: KEENEN ROESSNER MRN: [id number] Date of Birth: 1930-06-14  [facility] CM/SW Contact:  Curlene Labrum, RN Phone Number: 12/10/2019, 12:47 PM   Clinical Narrative:    Case management spoke with the family in depth about patient not qualifying for inpatient rehab.  The patient's family was educated regarding patient's needs and given choice again regarding SNF placement.  Patient's family asked me to reach out to [facility] first as their first choice.  I called the facility about possible acceptance and bed placement and they will return my call once they can determine if they are able to meet the patient's needs.  6/7 [facility] denied admittance for the patient based on needs, insurance and bed availability.  However, I spoke with the daughter and [facility] and the facility will be able to provide the family with compassion visits once a day.  I spoke with the grandaughter, Sharyn Lull and she is aware of this.  [facility] started in authorization for insurance today.  Will follow for transition needs.   Final next level of care: Skilled Nursing Facility Barriers to Discharge: (Waiting on SNf placement)   Patient Goals and CMS Choice Patient states their goals for this hospitalization and ongoing recovery are:: Patient wants to feel better. CMS Medicare.gov Compare Post Acute Care list provided to:: Patient Represenative (must comment) Choice offered to / list presented to : [place] / [place]  Discharge Placement                       Discharge Plan and Services   Discharge Planning Services: CM Consult Post Acute Care Choice: [place]                               Social Determinants of Health (SDOH) Interventions     Readmission Risk Interventions Readmission Risk Prevention Plan 12/06/2019  Transportation Screening Complete  PCP or  Specialist Appt within 5-7 Days Complete  Home Care Screening Complete  Medication Review (RN CM) Complete  Some recent data might be hidden

## 2019-12-10 NOTE — Plan of Care (Signed)

## 2019-12-10 NOTE — Progress Notes (Addendum)
Occupational Therapy Treatment Patient Details Name: John [name] MRN: [id number] DOB: 14-Jun-1930 Today's Date: 12/10/2019    History of present illness Pt is a 86 y.o. M with significant PMH of CAD s/p CABG, STEMI, CKD stage III, CHF, Covid 19, who presents following a fall with T12 compression fracture and superior endplate fracture at L1 and acute encephalopathy. Pt considered not a good surgical candidate by neurosurgery.   OT comments  Pt with good motivation to participate in therapies. Pt requiring increased assist to stand from recliner height and for initial steadying due to posterior lean. Once stabilized pt tolerating marching in place and taking few steps forward/backward from recliner with minA using RW (deferred mobility away from recliner given safety concerns without +2/chair follow). Further reviewed/educated in back precautions, will benefit from continued review within ADL context. Feel SNF recommendation remains appropriate at this time. Will continue per POC.   Follow Up Recommendations  SNF;Supervision/Assistance - 24 hour    Equipment Recommendations  Other (comment)(TBD)          Precautions / Restrictions Precautions Precautions: Fall;Back Precaution Booklet Issued: No Required Braces or Orthoses: Spinal Brace Spinal Brace: Thoracolumbosacral orthotic(clamshell, wear when mobilizing out of bed) Restrictions Weight Bearing Restrictions: No       Mobility Bed Mobility Overal bed mobility: Needs Assistance Bed Mobility: Rolling;Sidelying to Sit Rolling: Min assist Sidelying to sit: Mod assist       General bed mobility comments: OOB in recliner upon arrival, pt opting to remain in recliner end of session (when given the option of returning to bed)  Transfers Overall transfer level: Needs assistance Equipment used: Rolling walker (2 wheeled) Transfers: Sit to/from Stand Sit to Stand: Mod assist         General transfer comment: pt required  boosting assist from recliner, increased time to stabilize as pt retropulsive with inital stand; once in standing pt able to take steps in place, forward and backward from recliner with minA    Balance Overall balance assessment: Needs assistance Sitting-balance support: Feet supported Sitting balance-Leahy Scale: Fair Sitting balance - Comments: Posterior lean; requires multimodal cues to correct   Standing balance support: Bilateral upper extremity supported;During functional activity Standing balance-Leahy Scale: Poor Standing balance comment: reliant on external support                           ADL either performed or assessed with clinical judgement   ADL Overall ADL's : Needs assistance/impaired     Grooming: Wash/dry face;Set up;Sitting Grooming Details (indicate cue type and reason): seated in recliner                              Functional mobility during ADLs: Moderate assistance;Minimal assistance;Rolling walker General ADL Comments: continued review/education of back precautions, will benefit from continued review in relation to ADL/functional tasks      Vision       Perception     Praxis      Cognition Arousal/Alertness: Awake/alert Behavior During Therapy: WFL for tasks assessed/performed Overall Cognitive Status: Impaired/Different from baseline Area of Impairment: Following commands;Safety/judgement;Awareness;Problem solving                       Following Commands: Follows one step commands consistently Safety/Judgement: Decreased awareness of deficits   Problem Solving: Difficulty sequencing;Requires verbal cues;Requires tactile cues General Comments: Pt extremely HOH, has difficulty with  problem solving.        Exercises Exercises: General Lower Extremity;Other exercises General Exercises - Lower Extremity Ankle Circles/Pumps: AROM;Both;10 reps Long Arc Quad: AROM;Both;10 reps;Seated Other Exercises Other  Exercises: chest press bil UE x15   Shoulder Instructions       General Comments pt's daughter present and supportive during session    Pertinent Vitals/ Pain       Pain Assessment: Faces Faces Pain Scale: Hurts a little bit Pain Location: back, L flank Pain Descriptors / Indicators: Grimacing;Guarding Pain Intervention(s): Monitored during session;Repositioned  Home Living                                          Prior Functioning/Environment              Frequency  Min 2X/week        Progress Toward Goals  OT Goals(current goals can now be found in the care plan section)  Progress towards OT goals: Progressing toward goals  Acute Rehab OT Goals Patient Stated Goal: walk and return home ADL Goals Pt Will Perform Grooming: with supervision;standing Pt Will Perform Upper Body Dressing: sitting;with min guard assist Pt Will Perform Lower Body Dressing: with min guard assist;with adaptive equipment;sitting/lateral leans;sit to/from stand Pt Will Transfer to Toilet: with min guard assist;ambulating;bedside commode;grab bars Pt Will Perform Toileting - Clothing Manipulation and hygiene: with min assist;sitting/lateral leans;sit to/from stand  Plan Discharge plan remains appropriate    Co-evaluation                 AM-PAC OT "6 Clicks" Daily Activity     Outcome Measure   Help from another person eating meals?: None Help from another person taking care of personal grooming?: A Little Help from another person toileting, which includes using toliet, bedpan, or urinal?: Total Help from another person bathing (including washing, rinsing, drying)?: A Lot Help from another person to put on and taking off regular upper body clothing?: A Lot Help from another person to put on and taking off regular lower body clothing?: A Lot 6 Click Score: 14    End of Session Equipment Utilized During Treatment: Gait belt;Rolling walker;Back brace  OT Visit  Diagnosis: Unsteadiness on feet (R26.81);History of falling (Z91.81);Pain Pain - part of body: (back)   Activity Tolerance Patient tolerated treatment well   Patient Left in chair;with call bell/phone within reach;with chair alarm set;with family/visitor present   Nurse Communication Mobility status        Time: 7867-6720 OT Time Calculation (min): 26 min  Charges: OT General Charges $OT Visit: 1 Visit OT Treatments $Self Care/Home Management : 8-22 mins $Therapeutic Activity: 8-22 mins  Lou Cal, OT Acute Rehabilitation Services Pager [phone] Office [place] 12/10/2019, 5:40 PM

## 2019-12-10 NOTE — Progress Notes (Signed)
Progress Note    John [name]  HEN:[id number] DOB: 01/31/1930  DOA: 12/03/2019 PCP: Annie Main, FNP    Brief Narrative:    Medical records reviewed and are as summarized below:  John [name] is an 86 y.o. male with a past medical history that includes CAD status post CABG, STEMI in 2017, chronic kidney disease stage III, chronic combined systolic diastolic heart failure, ischemic cardiomyopathy with an EF of 30%, hypertension, chronic thrombocytopenia, hyperlipidemia, Covid + March 2021 transferred to [facility] on June 1 from [facility] as recommended by neurosurgery for evaluation of compression fracture of T10 and T12. MRI was obtained he was evaluated by neurosurgery who opined not a surgical candidate. Recommended mobilization with TSLO brace. Plan for SNF.  Assessment/Plan:   Principal Problem:   Compression fracture of T12 vertebra (HCC) Active Problems:   Hypokalemia   CAD in native artery   Essential hypertension, benign   CKD (chronic kidney disease), stage III   Acute encephalopathy   Chronic back pain  Compression fracture of T10 and T12. Related to a fall. Patient had been placed on Flexeril for chronic pain and Bactrim for UTI prior to admission. Evaluated by neurosurgery who opined T12 compression fracture with 9 mm retropulsion without cord compression and not a good surgical candidate. Recommended TSLO bracewhen out of bed and mobilization. Pain controlled today.  -PT recommendations: SNF -placement process ongoing.  Acute encephalopathy/deliriurm. Calm and cooperative this am.  -has episodes of hallucinations-- ? lewy body dementia vs delirium (was ahhpening prior to admission)  Combined systolic/diastolic heart failure/ischemic cardiomyopathy. Provided with gentle IV fluids initially.  Echo in December 2020 revealed an EF of 25 to 30%. Home medications include Entresto, Lasix, metoprolol. -with entresto patient appears to become  too dehydrated. Continue prn lasix for now with close monitoring -Monitor intake and output  Hypertension. BP high end of normal.  -continue home metoprolol -resume meds as able -monitor  CAD status post CABG. No chest pain. EKG with a left bundle branch block similar to previous. Home medications include aspirin, Plavix and metoprolol. -Continue aspirin  Chronic kidney disease stage IIIb. Creatinine 0.99 this morning. Voiding freely. -Monitor urine output -Hold nephrotoxins as able  hypokalemia.  -resolved    Family Communication/Anticipated D/C date and plan/Code Status   DVT prophylaxis: scd's. Code Status: dnr Family Communication: daughter 6/3 Disposition Plan: Status is: Inpatient  Remains inpatient appropriate because:Unsafe d/c plan   Dispo: The patient is from: Home              Anticipated d/c is to: SNF              Anticipated d/c date is: 2 days              Patient currently is medically stable to d/c.         Medical Consultants:    neurosurgery   Anti-Infectives:    None  Subjective:   Responds appropriately to question/commands. Denies pain/discomfort.  Objective:    Vitals:   12/09/19 1915 12/10/19 0500 12/10/19 0627 12/10/19 0723  BP: 131/67  (!) 180/93 (!) 166/82  Pulse: 73  71 69  Resp: 14  14 19   Temp: 98 F (36.7 C)  (!) 97.5 F (36.4 C) 97.7 F (36.5 C)  TempSrc: Oral  Oral Oral  SpO2: 94%  95% 97%  Weight:  81.1 kg    Height:        Intake/Output Summary (Last 24  hours) at 12/10/2019 1256 Last data filed at 12/10/2019 0900 Gross per 24 hour  Intake 240 ml  Output 1000 ml  Net -760 ml   Filed Weights   12/08/19 0418 12/09/19 0500 12/10/19 0500  Weight: 83 kg 75.5 kg 81.1 kg    Exam: Gen: alert very HOH no acute distress CV: rrr no mgr no LE edema Respiratory: no increased work of breathing, BS clear bilaterally no wheeze or crackles Abdomen: joints without swelling/erythema full rom Neuro: alert  oriented to self. Moves all extremities spontaneously. Follows simple commands  Data Reviewed:   I have personally reviewed following labs and imaging studies:  Labs: Labs show the following:   Basic Metabolic Panel: Recent Labs  Lab 12/03/19 2022 12/03/19 2022 12/04/19 0934 12/04/19 0934 12/05/19 1525 12/05/19 1525 12/06/19 0449 12/06/19 0449 12/07/19 0729 12/09/19 0303  NA 139   < > 142  --  140  --  139  --  140 142  K 3.5   < > 3.8   < > 3.4*   < > 3.1*   < > 3.8 3.5  CL 105   < > 106  --  106  --  108  --  109 108  CO2 24   < > 24  --  24  --  22  --  20* 25  GLUCOSE 114*   < > 89  --  81  --  73  --  71 80  BUN 28*   < > 25*  --  23  --  22  --  17 9  CREATININE 1.05   < > 1.17  --  1.10  --  0.99  --  0.91 0.99  CALCIUM 9.1   < > 9.4  --  9.0  --  8.4*  --  8.7* 8.8*  MG 2.0  --   --   --   --   --  1.9  --   --   --    < > = values in this interval not displayed.   GFR Estimated Creatinine Clearance: 56 mL/min (by C-G formula based on SCr of 0.99 mg/dL). Liver Function Tests: Recent Labs  Lab 12/03/19 2022 12/04/19 0934  AST 48* 41  ALT 33 32  ALKPHOS 73 68  BILITOT 3.7* 3.5*  PROT 6.2* 5.8*  ALBUMIN 3.5 3.4*   No results for input(s): LIPASE, AMYLASE in the last 168 hours. No results for input(s): AMMONIA in the last 168 hours. Coagulation profile No results for input(s): INR, PROTIME in the last 168 hours.  CBC: Recent Labs  Lab 12/03/19 2022 12/04/19 0934 12/06/19 0449 12/07/19 0729 12/09/19 0303  WBC 6.4 5.6 4.6 5.0 3.9*  NEUTROABS 5.1  --   --   --   --   HGB 14.3 14.0 12.9* 14.2 13.8  HCT 42.4 41.3 38.7* 41.4 40.5  MCV 95.7 95.8 96.0 96.3 94.2  PLT 91* 89* 75* 82* 87*   Cardiac Enzymes: Recent Labs  Lab 12/03/19 2022  CKTOTAL 715*   BNP (last 3 results) No results for input(s): PROBNP in the last 8760 hours. CBG: No results for input(s): GLUCAP in the last 168 hours. D-Dimer: No results for input(s): DDIMER in the last 72  hours. Hgb A1c: No results for input(s): HGBA1C in the last 72 hours. Lipid Profile: No results for input(s): CHOL, HDL, LDLCALC, TRIG, CHOLHDL, LDLDIRECT in the last 72 hours. Thyroid function studies: No results for input(s): TSH, T4TOTAL, T3FREE,  THYROIDAB in the last 72 hours.  Invalid input(s): FREET3 Anemia work up: Recent Labs    12/10/19 [place] 401   Sepsis Labs: Recent Labs  Lab 12/04/19 0934 12/06/19 0449 12/07/19 0729 12/09/19 0303  WBC 5.6 4.6 5.0 3.9*    Microbiology Recent Results (from the past 240 hour(s))  SARS Coronavirus 2 by RT PCR (hospital order, performed in [facility] hospital lab) Nasopharyngeal Nasopharyngeal Swab     Status: None   Collection Time: 12/03/19 10:20 PM   Specimen: Nasopharyngeal Swab  Result Value Ref Range Status   SARS Coronavirus 2 NEGATIVE NEGATIVE Final    Comment: (NOTE) SARS-CoV-2 target nucleic acids are NOT DETECTED. The SARS-CoV-2 RNA is generally detectable in upper and lower respiratory specimens during the acute phase of infection. The lowest concentration of SARS-CoV-2 viral copies this assay can detect is 250 copies / mL. A negative result does not preclude SARS-CoV-2 infection and should not be used as the sole basis for treatment or other patient management decisions.  A negative result may occur with improper specimen collection / handling, submission of specimen other than nasopharyngeal swab, presence of viral mutation(s) within the areas targeted by this assay, and inadequate number of viral copies (<250 copies / mL). A negative result must be combined with clinical observations, patient history, and epidemiological information. Fact Sheet for Patients:   StrictlyIdeas.no Fact Sheet for Healthcare Providers: BankingDealers.co.za This test is not yet approved or cleared  by the [place] FDA and has been authorized for detection and/or diagnosis  of SARS-CoV-2 by FDA under an Emergency Use Authorization (EUA).  This EUA will remain in effect (meaning this test can be used) for the duration of the COVID-19 declaration under Section 564(b)(1) of the Act, 21 U.S.C. section 360bbb-3(b)(1), unless the authorization is terminated or revoked sooner. Performed at [facility], [address], [place], [place] [address]   Urine Culture     Status: None   Collection Time: 12/05/19  7:39 AM   Specimen: Urine, Clean Catch  Result Value Ref Range Status   Specimen Description URINE, CLEAN CATCH  Final   Special Requests NONE  Final   Culture   Final    NO GROWTH Performed at [facility], [address] [place], [place] [address]    Report Status 12/06/2019 FINAL  Final    Procedures and diagnostic studies:  No results found.  Medications:   . acetaminophen  1,000 mg Oral TID  . aspirin EC  81 mg Oral Daily  . fludrocortisone  100 mcg Oral Daily  . metoprolol succinate  25 mg Oral BID  . pantoprazole  40 mg Oral Daily  . tamsulosin  0.4 mg Oral Daily   Continuous Infusions:   LOS: 7 days   Radene Gunning NP [facility]   How to contact the [facility] Attending or Consulting provider [place] or covering provider during after hours [place], for this patient?  1. Check the care team in [facility] and look for a) attending/consulting [facility] provider listed and b) the [facility] team listed 2. Log into [email or web address] and use [place]'s universal password to access. If you do not have the password, please contact the hospital operator. 3. Locate the [facility] provider you are looking for under [facility] and page to a number that you can be directly reached. 4. If you still have difficulty reaching the provider, please page the [facility] (Director on Call) for the Hospitalists listed on amion  for assistance.  12/10/2019, 12:56 PM

## 2019-12-11 NOTE — Plan of Care (Signed)
  Problem: Education: Goal: Ability to verbalize activity precautions or restrictions will improve Outcome: Progressing Goal: Knowledge of the prescribed therapeutic regimen will improve Outcome: Progressing Goal: Understanding of discharge needs will improve Outcome: Progressing   

## 2019-12-11 NOTE — Progress Notes (Signed)
Progress Note    John [name]  ZHG:[id number] DOB: Sep 25, 1929  DOA: 12/03/2019 PCP: Annie Main, FNP    Brief Narrative:    Medical records reviewed and are as summarized below:  John [name] is an 86 y.o. male with a past medical history that includes CAD status post CABG, STEMI in 2017, chronic kidney disease stage III, chronic combined systolic diastolic heart failure, ischemic cardiomyopathy with an EF of 30%, hypertension, chronic thrombocytopenia, hyperlipidemia, Covid + March 2021 transferred to [facility] on June 1 from [facility] as recommended by neurosurgery for evaluation of compression fracture of T10 and T12. MRI was obtained he was evaluated by neurosurgery who opined not a surgical candidate. Recommended mobilization with TSLO brace. Plan for SNF. Poor overall prognosis for return to prior living situation.  Assessment/Plan:   Principal Problem:   Compression fracture of T12 vertebra (HCC) Active Problems:   CAD in native artery   Essential hypertension, benign   Chronic back pain   CKD (chronic kidney disease), stage III   Acute encephalopathy   Hypokalemia  Compression fracture of T10 and T12. Related to a fall. Patient had been placed on Flexeril for chronic pain and Bactrim for UTI prior to admission. Evaluated by neurosurgery who opined T12 compression fracture with 9 mm retropulsion without cord compression and not a good surgical candidate. Recommended TSLO bracewhen out of bed and mobilization. Pain controlled   -PT recommendations: SNF -placement process ongoing- awaiting insurance auth  Acute encephalopathy/deliriurm. Calm and cooperative this am.  -has episodes of hallucinations-- ? lewy body dementia vs delirium (was happening prior to admission) vs medications (d/c'd robaxin and flexoril)  Combined systolic/diastolic heart failure/ischemic cardiomyopathy. Provided with gentle IV fluids initially.  Echo in December 2020  revealed an EF of 25 to 30%. Home medications include Entresto, Lasix, metoprolol. -with entresto patient appears to become too dehydrated. Continue prn lasix for now with close monitoring -Monitor intake and output -have d/c'd florinef  Hypertension. BP high end of normal.  -continue home metoprolol -resume meds as able -monitor  CAD status post CABG. No chest pain. EKG with a left bundle branch block similar to previous. Home medications include aspirin, Plavix and metoprolol. -Continue aspirin  Chronic kidney disease stage IIIb. Creatinine 0.99 this morning. Voiding freely. -Monitor urine output -Hold nephrotoxins as able  hypokalemia.  -resolved    Family Communication/Anticipated D/C date and plan/Code Status   DVT prophylaxis: scd's. Code Status: dnr Family Communication: daughter 6/6 Disposition Plan: Status is: Inpatient  Remains inpatient appropriate because:Unsafe d/c plan   Dispo: The patient is from: Home              Anticipated d/c is to: SNF              Anticipated d/c date is: when insurance auth back              Patient currently is medically stable to d/c.    Medical Consultants:    neurosurgery     Subjective:   Ready to eat breakfast-- did not sleep well last night as his blanket was wet  Objective:    Vitals:   12/10/19 1639 12/10/19 1900 12/11/19 0259 12/11/19 0833  BP: 109/73 127/69 (!) 144/86 (!) 173/99  Pulse: 88 75 75 90  Resp: 18 17 17 18   Temp: 97.7 F (36.5 C) 97.7 F (36.5 C) 98 F (36.7 C) (!) 97.5 F (36.4 C)  TempSrc: Oral Oral Oral Axillary  SpO2: 98% 93% 93% 96%  Weight:      Height:        Intake/Output Summary (Last 24 hours) at 12/11/2019 1358 Last data filed at 12/10/2019 2100 Gross per 24 hour  Intake 480 ml  Output 350 ml  Net 130 ml   Filed Weights   12/08/19 0418 12/09/19 0500 12/10/19 0500  Weight: 83 kg 75.5 kg 81.1 kg    Exam:  General: Appearance:    Well developed, well  nourished male in no acute distress     Lungs:     Clear to auscultation bilaterally, respirations unlabored  Heart:    Normal heart rate. Normal rhythm. No murmurs, rubs, or gallops.   MS:   All extremities are intact.   Neurologic: Awake and alert    Data Reviewed:   I have personally reviewed following labs and imaging studies:  Labs: Labs show the following:   Basic Metabolic Panel: Recent Labs  Lab 12/05/19 1525 12/05/19 1525 12/06/19 0449 12/06/19 0449 12/07/19 0729 12/09/19 0303  NA 140  --  139  --  140 142  K 3.4*   < > 3.1*   < > 3.8 3.5  CL 106  --  108  --  109 108  CO2 24  --  22  --  20* 25  GLUCOSE 81  --  73  --  71 80  BUN 23  --  22  --  17 9  CREATININE 1.10  --  0.99  --  0.91 0.99  CALCIUM 9.0  --  8.4*  --  8.7* 8.8*  MG  --   --  1.9  --   --   --    < > = values in this interval not displayed.   GFR Estimated Creatinine Clearance: 56 mL/min (by C-G formula based on SCr of 0.99 mg/dL). Liver Function Tests: No results for input(s): AST, ALT, ALKPHOS, BILITOT, PROT, ALBUMIN in the last 168 hours. No results for input(s): LIPASE, AMYLASE in the last 168 hours. No results for input(s): AMMONIA in the last 168 hours. Coagulation profile No results for input(s): INR, PROTIME in the last 168 hours.  CBC: Recent Labs  Lab 12/06/19 0449 12/07/19 0729 12/09/19 0303  WBC 4.6 5.0 3.9*  HGB 12.9* 14.2 13.8  HCT 38.7* 41.4 40.5  MCV 96.0 96.3 94.2  PLT 75* 82* 87*   Cardiac Enzymes: No results for input(s): CKTOTAL, CKMB, CKMBINDEX, TROPONINI in the last 168 hours. BNP (last 3 results) No results for input(s): PROBNP in the last 8760 hours. CBG: No results for input(s): GLUCAP in the last 168 hours. D-Dimer: No results for input(s): DDIMER in the last 72 hours. Hgb A1c: No results for input(s): HGBA1C in the last 72 hours. Lipid Profile: No results for input(s): CHOL, HDL, LDLCALC, TRIG, CHOLHDL, LDLDIRECT in the last 72 hours. Thyroid  function studies: No results for input(s): TSH, T4TOTAL, T3FREE, THYROIDAB in the last 72 hours.  Invalid input(s): FREET3 Anemia work up: Recent Labs    12/10/19 [place] 401   Sepsis Labs: Recent Labs  Lab 12/06/19 0449 12/07/19 0729 12/09/19 0303  WBC 4.6 5.0 3.9*    Microbiology Recent Results (from the past 240 hour(s))  SARS Coronavirus 2 by RT PCR (hospital order, performed in [facility] hospital lab) Nasopharyngeal Nasopharyngeal Swab     Status: None   Collection Time: 12/03/19 10:20 PM   Specimen: Nasopharyngeal Swab  Result Value Ref Range Status  SARS Coronavirus 2 NEGATIVE NEGATIVE Final    Comment: (NOTE) SARS-CoV-2 target nucleic acids are NOT DETECTED. The SARS-CoV-2 RNA is generally detectable in upper and lower respiratory specimens during the acute phase of infection. The lowest concentration of SARS-CoV-2 viral copies this assay can detect is 250 copies / mL. A negative result does not preclude SARS-CoV-2 infection and should not be used as the sole basis for treatment or other patient management decisions.  A negative result may occur with improper specimen collection / handling, submission of specimen other than nasopharyngeal swab, presence of viral mutation(s) within the areas targeted by this assay, and inadequate number of viral copies (<250 copies / mL). A negative result must be combined with clinical observations, patient history, and epidemiological information. Fact Sheet for Patients:   StrictlyIdeas.no Fact Sheet for Healthcare Providers: BankingDealers.co.za This test is not yet approved or cleared  by the [place] FDA and has been authorized for detection and/or diagnosis of SARS-CoV-2 by FDA under an Emergency Use Authorization (EUA).  This EUA will remain in effect (meaning this test can be used) for the duration of the COVID-19 declaration under Section 564(b)(1) of the  Act, 21 U.S.C. section 360bbb-3(b)(1), unless the authorization is terminated or revoked sooner. Performed at [facility], [address], [place], [place] [address]   Urine Culture     Status: None   Collection Time: 12/05/19  7:39 AM   Specimen: Urine, Clean Catch  Result Value Ref Range Status   Specimen Description URINE, CLEAN CATCH  Final   Special Requests NONE  Final   Culture   Final    NO GROWTH Performed at [facility], [address] [place], [place] [address]    Report Status 12/06/2019 FINAL  Final  SARS CORONAVIRUS 2 (TAT 6-24 HRS)     Status: None   Collection Time: 12/10/19 12:00 PM  Result Value Ref Range Status   SARS Coronavirus 2 NEGATIVE NEGATIVE Final    Comment: (NOTE) SARS-CoV-2 target nucleic acids are NOT DETECTED. The SARS-CoV-2 RNA is generally detectable in upper and lower respiratory specimens during the acute phase of infection. Negative results do not preclude SARS-CoV-2 infection, do not rule out co-infections with other pathogens, and should not be used as the sole basis for treatment or other patient management decisions. Negative results must be combined with clinical observations, patient history, and epidemiological information. The expected result is Negative. Fact Sheet for Patients: SugarRoll.be Fact Sheet for Healthcare Providers: https://www.woods-mathews.com/ This test is not yet approved or cleared by the [place] FDA and  has been authorized for detection and/or diagnosis of SARS-CoV-2 by FDA under an Emergency Use Authorization (EUA). This EUA will remain  in effect (meaning this test can be used) for the duration of the COVID-19 declaration under Section 56 4(b)(1) of the Act, 21 U.S.C. section 360bbb-3(b)(1), unless the authorization is terminated or revoked sooner. Performed at [facility], [place] [address] [place], [place] [address]     Procedures and diagnostic  studies:  No results found.  Medications:   . acetaminophen  1,000 mg Oral TID  . aspirin EC  81 mg Oral Daily  . fludrocortisone  100 mcg Oral Daily  . metoprolol succinate  25 mg Oral BID  . pantoprazole  40 mg Oral Daily  . tamsulosin  0.4 mg Oral Daily   Continuous Infusions:   LOS: 8 days   Geradine Girt  DO [facility]   How to  contact the [facility] Attending or Consulting provider [place] or covering provider during after hours [place], for this patient?  1. Check the care team in [facility] and look for a) attending/consulting [facility] provider listed and b) the [facility] team listed 2. Log into [email or web address] and use [place]'s universal password to access. If you do not have the password, please contact the hospital operator. 3. Locate the [facility] provider you are looking for under [facility] and page to a number that you can be directly reached. 4. If you still have difficulty reaching the provider, please page the [facility] (Director on Call) for the Hospitalists listed on amion for assistance.  12/11/2019, 1:58 PM

## 2019-12-11 NOTE — Plan of Care (Signed)
  Problem: Pain Management: Goal: Pain level will decrease Outcome: Progressing Note: No complaints of pain this shift   Problem: Skin Integrity: Goal: Will show signs of wound healing Outcome: Progressing   Problem: Clinical Measurements: Goal: Respiratory complications will improve Outcome: Progressing Note: On room air   Problem: Activity: Goal: Risk for activity intolerance will decrease Outcome: Progressing Note: Up to chair with 2 assist, walker & TLSO brace on, tolerated well   Problem: Nutrition: Goal: Adequate nutrition will be maintained Outcome: Progressing   Problem: Coping: Goal: Level of anxiety will decrease Outcome: Progressing   Problem: Elimination: Goal: Will not experience complications related to bowel motility Outcome: Progressing Note: BM this shift   Problem: Safety: Goal: Ability to remain free from injury will improve Outcome: Progressing

## 2019-12-12 DIAGNOSIS — G8929 Other chronic pain: Secondary | ICD-10-CM

## 2019-12-12 DIAGNOSIS — G9340 Encephalopathy, unspecified: Secondary | ICD-10-CM

## 2019-12-12 DIAGNOSIS — I10 Essential (primary) hypertension: Secondary | ICD-10-CM

## 2019-12-12 DIAGNOSIS — I2510 Atherosclerotic heart disease of native coronary artery without angina pectoris: Secondary | ICD-10-CM

## 2019-12-12 DIAGNOSIS — M549 Dorsalgia, unspecified: Secondary | ICD-10-CM

## 2019-12-12 NOTE — Progress Notes (Signed)
[facility]  PROGRESS NOTE  John [name] HWE:[id number] DOB: [birth year] DOA: 12/03/2019 PCP: Annie Main, FNP   Brief HPI:   86 year old male with past medical history of CAD, s/p CABG, STEMI in 2017, chronic kidney disease stage III, chronic combined systolic and diastolic heart failure, ischemic cardiomyopathy with EF 30%, hypertension, chronic thrombocytopenia, hyperlipidemia, Covid positive in March 2021 transferred to [facility] from [facility] as per recommendation by neurosurgery for evaluation of compression fracture of T10 and T12.  MRI was obtained and neurosurgery opined that he is not a surgical candidate.  Recommended mobilization with TLSO brace.  Patient is waiting to go to skilled nursing facility.    Subjective   Patient seen and examined, continues to have hallucinations.  Says that the wall of the room was falling on him last night.   Assessment/Plan:     1. Compression fracture T10, T12-secondary to fall.  Patient had been placed on Flexeril for chronic pain and Bactrim for UTI.  Neurosurgery evaluated patient, and opined that T 12 compression fracture with 9 mm retropulsion without cord compression is not a good surgical candidate.  Recommended TLSO brace when out of bed and mobilization.  Pain control.  Physical therapy was consulted and recommended rehab at skilled facility.  Plan for skilled nursing facility for rehab once insurance authorization is complete. 2. Acute encephalopathy/delirium-patient having episodes of hallucinations, likely from Lewy body dementia versus delirium versus medication induced.  Flexeril and Robaxin were discontinued.  Patient was having hallucinations prior to admission. 3. Combined systolic and diastolic heart failure/ischemic cardiomyopathy-echo in December 2020 showed EF 25 to 30%.  He was on Entresto, Lasix and metoprolol.  [name] was held due to dehydration.  Continue metoprolol, Lasix 20 mg p.o. daily as  needed. 4. CAD s/p CABG-no chest pain, EKG showed left bundle branch block similar to previous.  Continue aspirin, metoprolol. 5. Chronic kidney disease stage IIIb-creatinine stable at 0.99. 6. Hypokalemia-replete    SpO2: 96 %   COVID-19 Labs  No results for input(s): DDIMER, FERRITIN, LDH, CRP in the last 72 hours.  Lab Results  Component Value Date   SARSCOV2NAA NEGATIVE 12/10/2019   SARSCOV2NAA NEGATIVE 12/03/2019   SARSCOV2NAA POSITIVE (A) 09/11/2019     CBG: No results for input(s): GLUCAP in the last 168 hours.  CBC: Recent Labs  Lab 12/06/19 0449 12/07/19 0729 12/09/19 0303  WBC 4.6 5.0 3.9*  HGB 12.9* 14.2 13.8  HCT 38.7* 41.4 40.5  MCV 96.0 96.3 94.2  PLT 75* 82* 87*    Basic Metabolic Panel: Recent Labs  Lab 12/05/19 1525 12/06/19 0449 12/07/19 0729 12/09/19 0303  NA 140 139 140 142  K 3.4* 3.1* 3.8 3.5  CL 106 108 109 108  CO2 24 22 20* 25  GLUCOSE 81 73 71 80  BUN 23 22 17 9   CREATININE 1.10 0.99 0.91 0.99  CALCIUM 9.0 8.4* 8.7* 8.8*  MG  --  1.9  --   --       DVT prophylaxis: SCDs  Code Status: DNR  Family Communication: No family at bedside    Status is: Inpatient  Dispo: The patient is from: Home                           Anticipated d/c is to: Skilled nursing facility              Anticipated d/c date is: 12/13/2019  Patient currently medically stable for discharge  Barrier to discharge-awaiting placement at skilled nursing facility for rehab.        Scheduled medications:  . acetaminophen  1,000 mg Oral TID  . aspirin EC  81 mg Oral Daily  . metoprolol succinate  25 mg Oral BID  . pantoprazole  40 mg Oral Daily  . tamsulosin  0.4 mg Oral Daily    Consultants:  Neurosurgery  Procedures:    Antibiotics:   Anti-infectives (From admission, onward)   None       Objective   Vitals:   12/11/19 1441 12/11/19 1938 12/12/19 0254 12/12/19 0923  BP: 124/73 (!) 152/93 (!) 145/89 (!) 109/92   Pulse: 79 69 67 81  Resp: 17 17 16 18   Temp: (!) 97.3 F (36.3 C) 97.6 F (36.4 C) (!) 97.5 F (36.4 C) 98.3 F (36.8 C)  TempSrc: Oral Oral Oral   SpO2: 96% 94% 97% 96%  Weight:      Height:       No intake or output data in the 24 hours ending 12/12/19 1439  06/07 1901 - 06/09 0700 In: -  Out: 350 [Urine:350]  Filed Weights   12/08/19 0418 12/09/19 0500 12/10/19 0500  Weight: 83 kg 75.5 kg 81.1 kg    Physical Examination:    General: Appears pleasantly confused  Cardiovascular: S1-S2, regular, no murmur auscultated  Respiratory: Clear to auscultation bilaterally  Abdomen: Soft, nontender, no organomegaly  Extremities: No edema in the lower extremities  Neurologic: Alert, pleasantly confused, moving all extremities    Data Reviewed:   Recent Results (from the past 240 hour(s))  SARS Coronavirus 2 by RT PCR (hospital order, performed in [place] hospital lab) Nasopharyngeal Nasopharyngeal Swab     Status: None   Collection Time: 12/03/19 10:20 PM   Specimen: Nasopharyngeal Swab  Result Value Ref Range Status   SARS Coronavirus 2 NEGATIVE NEGATIVE Final    Comment: (NOTE) SARS-CoV-2 target nucleic acids are NOT DETECTED. The SARS-CoV-2 RNA is generally detectable in upper and lower respiratory specimens during the acute phase of infection. The lowest concentration of SARS-CoV-2 viral copies this assay can detect is 250 copies / mL. A negative result does not preclude SARS-CoV-2 infection and should not be used as the sole basis for treatment or other patient management decisions.  A negative result may occur with improper specimen collection / handling, submission of specimen other than nasopharyngeal swab, presence of viral mutation(s) within the areas targeted by this assay, and inadequate number of viral copies (<250 copies / mL). A negative result must be combined with clinical observations, patient history, and epidemiological information. Fact  Sheet for Patients:   StrictlyIdeas.no Fact Sheet for Healthcare Providers: BankingDealers.co.za This test is not yet approved or cleared  by the [place] FDA and has been authorized for detection and/or diagnosis of SARS-CoV-2 by FDA under an Emergency Use Authorization (EUA).  This EUA will remain in effect (meaning this test can be used) for the duration of the COVID-19 declaration under Section 564(b)(1) of the Act, 21 U.S.C. section 360bbb-3(b)(1), unless the authorization is terminated or revoked sooner. Performed at [facility], [address] [place], [place] [address]   Urine Culture     Status: None   Collection Time: 12/05/19  7:39 AM   Specimen: Urine, Clean Catch  Result Value Ref Range Status   Specimen Description URINE, CLEAN CATCH  Final   Special Requests NONE  Final   Culture  Final    NO GROWTH Performed at [facility], [place] [address], [place], [place] [address]    Report Status 12/06/2019 FINAL  Final  SARS CORONAVIRUS 2 (TAT 6-24 HRS)     Status: None   Collection Time: 12/10/19 12:00 PM  Result Value Ref Range Status   SARS Coronavirus 2 NEGATIVE NEGATIVE Final    Comment: (NOTE) SARS-CoV-2 target nucleic acids are NOT DETECTED. The SARS-CoV-2 RNA is generally detectable in upper and lower respiratory specimens during the acute phase of infection. Negative results do not preclude SARS-CoV-2 infection, do not rule out co-infections with other pathogens, and should not be used as the sole basis for treatment or other patient management decisions. Negative results must be combined with clinical observations, patient history, and epidemiological information. The expected result is Negative. Fact Sheet for Patients: SugarRoll.be Fact Sheet for Healthcare Providers: https://www.woods-mathews.com/ This test is not yet approved or cleared by the [place]  FDA and  has been authorized for detection and/or diagnosis of SARS-CoV-2 by FDA under an Emergency Use Authorization (EUA). This EUA will remain  in effect (meaning this test can be used) for the duration of the COVID-19 declaration under Section 56 4(b)(1) of the Act, 21 U.S.C. section 360bbb-3(b)(1), unless the authorization is terminated or revoked sooner. Performed at [facility], [place] [address], [place], [place] [address]     BNP (last 3 results) Recent Labs    09/11/19 1309 12/03/19 2022  BNP 1,832.0* 712.0*    ProBNP (last 3 results) No results for input(s): PROBNP in the last 8760 hours.  Studies:  No results found.     Oswald Hillock   [facility] If 7PM-7AM, please contact night-coverage at [email or web address], Office  [phone]   12/12/2019, 2:39 PM  LOS: 9 days

## 2019-12-12 NOTE — Plan of Care (Signed)
  Problem: Education: Goal: Knowledge of General Education information will improve Description: Including pain rating scale, medication(s)/side effects and non-pharmacologic comfort measures Outcome: Progressing   Problem: Clinical Measurements: Goal: Respiratory complications will improve Outcome: Progressing Note: On room air   Problem: Activity: Goal: Risk for activity intolerance will decrease Outcome: Progressing   Problem: Nutrition: Goal: Adequate nutrition will be maintained Outcome: Progressing   Problem: Coping: Goal: Level of anxiety will decrease Outcome: Progressing   Problem: Elimination: Goal: Will not experience complications related to urinary retention Outcome: Progressing   Problem: Pain Managment: Goal: General experience of comfort will improve Outcome: Progressing   Problem: Safety: Goal: Ability to remain free from injury will improve Outcome: Progressing

## 2019-12-12 NOTE — Progress Notes (Signed)
Physical Therapy Treatment Patient Details Name: John [name] MRN: [id number] DOB: 21-Oct-1929 Today's Date: 12/12/2019    History of Present Illness Pt is a 86 y.o. M with significant PMH of CAD s/p CABG, STEMI, CKD stage III, CHF, Covid 19, who presents following a fall with T12 compression fracture and superior endplate fracture at L1 and acute encephalopathy. Pt considered not a good surgical candidate by neurosurgery.    PT Comments    Pt progressing slowly towards physical therapy goals. We were able to adjust the brace well enough today that he was able to perform mobility without the brace riding up. Recommend education to receiving nurse at SNF during report to ensure pt will have a proper fit when donned. Daughter continues to report that pt is having difficulty eating while sitting up due to brace riding up. Will continue to follow and progress as able per POC.     Follow Up Recommendations  SNF;Supervision/Assistance - 24 hour     Equipment Recommendations  (TBD by next venue of care)    Recommendations for Other Services       Precautions / Restrictions Precautions Precautions: Fall;Back Precaution Booklet Issued: No Required Braces or Orthoses: Spinal Brace Spinal Brace: Thoracolumbosacral orthotic(clamshell, wear when mobilizing out of bed) Restrictions Weight Bearing Restrictions: No    Mobility  Bed Mobility Overal bed mobility: Needs Assistance Bed Mobility: Rolling;Sidelying to Sit Rolling: Min assist Sidelying to sit: Mod assist;+2 for physical assistance       General bed mobility comments: Min A for rolling to right and left for placement of TLSO. +2 Mod A for sidelying>sit with assist for trunk to upright and use of bed pad to scoot hips anteriorly  Transfers Overall transfer level: Needs assistance Equipment used: Rolling walker (2 wheeled) Transfers: Sit to/from [facility] Sit to Stand: Mod assist;+2 physical assistance Stand  pivot transfers: Mod assist;+2 safety/equipment       General transfer comment: pt required boosting assist from elevated EOB, increased time to stabilize as pt retropulsive with inital stand. increased assist required for transition from recliner to [facility].   Ambulation/Gait Ambulation/Gait assistance: Min assist;+2 safety/equipment Gait Distance (Feet): 150 Feet Assistive device: Rolling walker (2 wheeled) Gait Pattern/deviations: Step-through pattern;Decreased stride length;Narrow base of support Gait velocity: decreased Gait velocity interpretation: <1.8 ft/sec, indicate of risk for recurrent falls General Gait Details: VC's for improved posture and closer proximity to walker. Chair follow utilized, and gait training ended early due to bowel incontinence in hall.    Stairs             Wheelchair Mobility    Modified Rankin (Stroke Patients Only)       Balance Overall balance assessment: Needs assistance Sitting-balance support: Feet supported Sitting balance-Leahy Scale: Fair Sitting balance - Comments: Posterior lean; requires multimodal cues to correct   Standing balance support: Bilateral upper extremity supported;During functional activity Standing balance-Leahy Scale: Poor Standing balance comment: reliant on external support                            Cognition Arousal/Alertness: Awake/alert Behavior During Therapy: WFL for tasks assessed/performed Overall Cognitive Status: Impaired/Different from baseline Area of Impairment: Following commands;Safety/judgement;Awareness;Problem solving                       Following Commands: Follows one step commands consistently Safety/Judgement: Decreased awareness of deficits   Problem Solving: Difficulty sequencing;Requires verbal cues;Requires tactile  cues General Comments: Pt extremely HOH, has difficulty with problem solving.      Exercises      General Comments General comments (skin  integrity, edema, etc.): Pt's daughter present and supportive throughout session.       Pertinent Vitals/Pain Pain Assessment: Faces Faces Pain Scale: Hurts a little bit Pain Location: back, L flank Pain Descriptors / Indicators: Grimacing;Guarding Pain Intervention(s): Limited activity within patient's tolerance;Monitored during session;Repositioned    Home Living                      Prior Function            PT Goals (current goals can now be found in the care plan section) Acute Rehab PT Goals Patient Stated Goal: walk and return home PT Goal Formulation: With patient/family Time For Goal Achievement: 12/20/19 Potential to Achieve Goals: Fair Progress towards PT goals: Progressing toward goals    Frequency    Min 3X/week      PT Plan Current plan remains appropriate    Co-evaluation              AM-PAC PT "6 Clicks" Mobility   Outcome Measure  Help needed turning from your back to your side while in a flat bed without using bedrails?: A Little Help needed moving from lying on your back to sitting on the side of a flat bed without using bedrails?: A Lot Help needed moving to and from a bed to a chair (including a wheelchair)?: A Little Help needed standing up from a chair using your arms (e.g., wheelchair or bedside chair)?: A Little Help needed to walk in hospital room?: A Little Help needed climbing 3-5 steps with a railing? : A Lot 6 Click Score: 16    End of Session Equipment Utilized During Treatment: Gait belt;Back brace Activity Tolerance: Patient tolerated treatment well Patient left: with call bell/phone within reach;with family/visitor present;Other (comment)(On BSC - daugher present) Nurse Communication: Mobility status(NT aware pt on [facility] with daughter present) PT Visit Diagnosis: Unsteadiness on feet (R26.81);Muscle weakness (generalized) (M62.81);History of falling (Z91.81);Difficulty in walking, not elsewhere classified  (R26.2);Pain Pain - part of body: (back)     Time: 9470-9628 PT Time Calculation (min) (ACUTE ONLY): 32 min  Charges:  $Gait Training: 23-37 mins                     John [name], PT, DPT Acute Rehabilitation Services Pager: [phone] Office: [phone]    [name] 12/12/2019, 11:53 AM

## 2019-12-12 NOTE — TOC Transition Note (Signed)
Transition of Care [facility]) - CM/SW Discharge Note   Patient Details  Name: John [name] MRN: [id number] Date of Birth: 24-Nov-1929  [facility] CM/SW Contact:  Curlene Labrum, RN Phone Number: 12/12/2019, 2:05 PM   Clinical Narrative:     Case Management called [place] to check on insurance authorization approval and [place] is requesting a Peer to Peer with the physician, Dr. Lisabeth Devoid today.  I messaged Dr. Lisabeth Devoid and asked that he followup with the peer to peer and speak to the physician [phone].  Dr. Lisabeth Devoid agreed and is following up.  I called to update the granddaughter, Sharyn Lull and spoke with her to update and explain the process of the peer to peer between the physicians.  The granddaughter is upset regarding the patient's care and I spoke with Ivin Booty [place] and [place] to speak to the family as well.  Support given to the family and will continue to follow up.  Final next level of care: Skilled Nursing Facility Barriers to Discharge: (Waiting on SNf placement)   Patient Goals and CMS Choice Patient states their goals for this hospitalization and ongoing recovery are:: Patient wants to feel better. CMS Medicare.gov Compare Post Acute Care list provided to:: Patient Represenative (must comment) Choice offered to / list presented to : [place] / [place]  Discharge Placement                       Discharge Plan and Services   Discharge Planning Services: CM Consult Post Acute Care Choice: [place]                               Social Determinants of Health (SDOH) Interventions     Readmission Risk Interventions Readmission Risk Prevention Plan 12/06/2019  Transportation Screening Complete  PCP or Specialist Appt within 5-7 Days Complete  Home Care Screening Complete  Medication Review (RN CM) Complete  Some recent data might be hidden

## 2019-12-13 DIAGNOSIS — S22080D Wedge compression fracture of T11-T12 vertebra, subsequent encounter for fracture with routine healing: Secondary | ICD-10-CM | POA: Diagnosis not present

## 2019-12-13 DIAGNOSIS — M2550 Pain in unspecified joint: Secondary | ICD-10-CM | POA: Diagnosis not present

## 2019-12-13 DIAGNOSIS — Z7401 Bed confinement status: Secondary | ICD-10-CM | POA: Diagnosis not present

## 2019-12-13 DIAGNOSIS — S22080A Wedge compression fracture of T11-T12 vertebra, initial encounter for closed fracture: Secondary | ICD-10-CM | POA: Diagnosis not present

## 2019-12-13 DIAGNOSIS — N1832 Chronic kidney disease, stage 3b: Secondary | ICD-10-CM | POA: Diagnosis not present

## 2019-12-13 DIAGNOSIS — R410 Disorientation, unspecified: Secondary | ICD-10-CM | POA: Diagnosis not present

## 2019-12-13 DIAGNOSIS — M549 Dorsalgia, unspecified: Secondary | ICD-10-CM | POA: Diagnosis not present

## 2019-12-13 DIAGNOSIS — G9340 Encephalopathy, unspecified: Secondary | ICD-10-CM | POA: Diagnosis not present

## 2019-12-13 DIAGNOSIS — I10 Essential (primary) hypertension: Secondary | ICD-10-CM | POA: Diagnosis not present

## 2019-12-13 DIAGNOSIS — I5040 Unspecified combined systolic (congestive) and diastolic (congestive) heart failure: Secondary | ICD-10-CM | POA: Diagnosis not present

## 2019-12-13 DIAGNOSIS — R262 Difficulty in walking, not elsewhere classified: Secondary | ICD-10-CM | POA: Diagnosis not present

## 2019-12-13 DIAGNOSIS — M6281 Muscle weakness (generalized): Secondary | ICD-10-CM | POA: Diagnosis not present

## 2019-12-13 DIAGNOSIS — I2510 Atherosclerotic heart disease of native coronary artery without angina pectoris: Secondary | ICD-10-CM | POA: Diagnosis not present

## 2019-12-13 DIAGNOSIS — R2681 Unsteadiness on feet: Secondary | ICD-10-CM | POA: Diagnosis not present

## 2019-12-13 DIAGNOSIS — Z743 Need for continuous supervision: Secondary | ICD-10-CM | POA: Diagnosis not present

## 2019-12-13 DIAGNOSIS — R1312 Dysphagia, oropharyngeal phase: Secondary | ICD-10-CM | POA: Diagnosis not present

## 2019-12-13 DIAGNOSIS — M545 Low back pain: Secondary | ICD-10-CM | POA: Diagnosis not present

## 2019-12-13 MED ORDER — ACETAMINOPHEN 500 MG PO TABS: 1000.0000 mg | ORAL_TABLET | Freq: Three times a day (TID) | ORAL | 0 refills | Status: AC | PRN

## 2019-12-13 MED ORDER — LISINOPRIL 5 MG PO TABS: 5.0000 mg | ORAL_TABLET | Freq: Every day | ORAL | 11 refills | Status: DC

## 2019-12-13 NOTE — TOC Transition Note (Signed)
Transition of Care [facility]) - CM/SW Discharge Note   Patient Details  Name: John [name] MRN: [id number] Date of Birth: [birth year]  Transition of Care [facility]) CM/SW Contact:  Curlene Labrum, RN Phone Number: 12/13/2019, 10:05 AM   Clinical Narrative:    Case Management called and spoke with Jackelyn Poling, [place] with [facility] to arrange transfer of the patient to the facility today.  The facility has bed availability and will be returning my call after aquiring insurance authorization information.  I called and spoke with Maylene Roes, granddaughter, this morning in regards to patient's transfer today to the facility.  The patient's daughter, Vaughan Basta, will be visiting today with the patient prior to the patient's transfer.  Will follow for transfer to the SNF.   Final next level of care: Skilled Nursing Facility Barriers to Discharge:  (Waiting on SNf placement)   Patient Goals and CMS Choice Patient states their goals for this hospitalization and ongoing recovery are:: Patient wants to feel better. CMS Medicare.gov Compare Post Acute Care list provided to:: Patient Represenative (must comment) Choice offered to / list presented to : [place] / [place]  Discharge Placement                       Discharge Plan and Services   Discharge Planning Services: CM Consult Post Acute Care Choice: [place]                               Social Determinants of Health (SDOH) Interventions     Readmission Risk Interventions Readmission Risk Prevention Plan 12/06/2019  Transportation Screening Complete  PCP or Specialist Appt within 5-7 Days Complete  Home Care Screening Complete  Medication Review (RN CM) Complete  Some recent data might be hidden

## 2019-12-13 NOTE — Plan of Care (Signed)
  Problem: Education: Goal: Ability to verbalize activity precautions or restrictions will improve Outcome: Progressing Goal: Knowledge of the prescribed therapeutic regimen will improve Outcome: Progressing Goal: Understanding of discharge needs will improve Outcome: Progressing   

## 2019-12-13 NOTE — Discharge Summary (Signed)
Physician Discharge Summary  ARTIST BLOOM TML:[id number] DOB: 12-25-1929 DOA: 12/03/2019  PCP: Annie Main, FNP  Admit date: 12/03/2019 Discharge date: 12/13/2019  Time spent: 35* minutes  Recommendations for Outpatient Follow-up:  1. Follow-up PCP in 1 week. 2. \Follow-up cardiology in 2 weeks  Discharge Diagnoses:  Principal Problem:   Compression fracture of T12 vertebra (HCC) Active Problems:   CAD in native artery   Essential hypertension, benign   Chronic back pain   CKD (chronic kidney disease), stage III   Acute encephalopathy   Hypokalemia   Discharge Condition: Stable  Diet recommendation: Heart healthy diet  Filed Weights   12/08/19 0418 12/09/19 0500 12/10/19 0500  Weight: 83 kg 75.5 kg 81.1 kg    History of present illness:  86 year old male with past medical history of CAD, s/p CABG, STEMI in 2017, chronic kidney disease stage III, chronic combined systolic and diastolic heart failure, ischemic cardiomyopathy with EF 30%, hypertension, chronic thrombocytopenia, hyperlipidemia, Covid positive in March 2021 transferred to [facility] from [facility] as per recommendation by neurosurgery for evaluation of compression fracture of T10 and T12.  MRI was obtained and neurosurgery opined that he is not a surgical candidate.  Recommended mobilization with TLSO brace.  Patient is waiting to go to skilled nursing facility.  Hospital Course:   1. Compression fracture T10, T12-secondary to fall.  Patient had been placed on Flexeril for chronic pain and Bactrim for UTI.  Neurosurgery evaluated patient, and opined that T 12 compression fracture with 9 mm retropulsion without cord compression is not a good surgical candidate.  Recommended TLSO brace when out of bed and mobilization.  Pain control.  Physical therapy was consulted and recommended rehab at skilled facility.  Plan for skilled nursing facility for rehab. 2. Acute encephalopathy/delirium-improved, patient  having intermittent episodes of hallucinations, likely from Lewy body dementia versus delirium versus medication induced.  Flexeril and Robaxin were discontinued.  Patient was having hallucinations prior to admission.  Avoid opioids. 3. Combined systolic and diastolic heart failure/ischemic cardiomyopathy-echo in December 2020 showed EF 25 to 30%.  He was on Entresto, Lasix and metoprolol.  [name] was held due to dehydration.  Continue metoprolol, Lasix 20 mg p.o. daily, will add lisinopril 5 mg daily, continue with Aldactone 12.5 mg p.o. daily.  Patient has an appointment with cardiology to see them in 2 weeks. 4. CAD s/p CABG-no chest pain, EKG showed left bundle branch block similar to previous.  Continue aspirin, metoprolol. 5. Chronic kidney disease stage IIIb-creatinine stable at 0.99. 6. Hypokalemia-replete  Procedures:    Consultations:  Neurosurgery  Discharge Exam: Vitals:   12/13/19 0335 12/13/19 0839  BP: (!) 163/84 125/72  Pulse: 71 87  Resp: 16 18  Temp: 97.8 F (36.6 C) 97.8 F (36.6 C)  SpO2: 95% 93%    General: Appears in no acute distress Cardiovascular: S1-S2, regular Respiratory: Clear to auscultation bilaterally  Discharge Instructions   Discharge Instructions    Diet - low sodium heart healthy   Complete by: As directed    Increase activity slowly   Complete by: As directed    No wound care   Complete by: As directed      Allergies as of 12/13/2019      Reactions   Statins Other (See Comments)   Weak,nervous   Xanax [alprazolam] Other (See Comments)   Hallucination   Tape Rash      Medication List    STOP taking these medications   Entresto  24-26 MG Generic drug: sacubitril-valsartan   ibuprofen 400 MG tablet Commonly known as: ADVIL   sulfamethoxazole-trimethoprim 800-160 MG tablet Commonly known as: BACTRIM DS     TAKE these medications   acetaminophen 500 MG tablet Commonly known as: TYLENOL Take 2 tablets (1,000 mg total)  by mouth every 8 (eight) hours as needed for moderate pain.   aspirin EC 81 MG tablet Take 81 mg by mouth daily.   clotrimazole-betamethasone cream Commonly known as: LOTRISONE Apply 1 application topically 2 (two) times daily as needed (for irritation).   cyclobenzaprine 5 MG tablet Commonly known as: FLEXERIL Take 1 tablet (5 mg total) by mouth 2 (two) times daily as needed for muscle spasms.   fludrocortisone 0.1 MG tablet Commonly known as: FLORINEF Take 100 mcg by mouth daily.   furosemide 20 MG tablet Commonly known as: LASIX Take 1 tablet (20 mg total) by mouth daily.   lisinopril 5 MG tablet Commonly known as: ZESTRIL Take 1 tablet (5 mg total) by mouth daily.   metoprolol succinate 25 MG 24 hr tablet Commonly known as: TOPROL-XL Take 1 tablet by mouth twice daily What changed: when to take this   neomycin-polymyxin b-dexamethasone 3.5-10000-0.1 Oint Commonly known as: MAXITROL Place 1 application into the left eye daily.   nitroGLYCERIN 0.4 MG SL tablet Commonly known as: NITROSTAT Place 1 tablet (0.4 mg total) under the tongue every 5 (five) minutes x 3 doses as needed for chest pain.   pantoprazole 40 MG tablet Commonly known as: PROTONIX Take 40 mg by mouth daily.   potassium chloride SA 20 MEQ tablet Commonly known as: KLOR-CON Take 2 tablets (40 mEq total) by mouth daily. What changed: how much to take   spironolactone 25 MG tablet Commonly known as: ALDACTONE Take 0.5 tablets (12.5 mg total) by mouth daily.   tamsulosin 0.4 MG Caps capsule Commonly known as: FLOMAX Take 1 capsule (0.4 mg total) by mouth daily.      Allergies  Allergen Reactions  . Statins Other (See Comments)    Weak,nervous  . Xanax [Alprazolam] Other (See Comments)    Hallucination  . Tape Rash      The results of significant diagnostics from this hospitalization (including imaging, microbiology, ancillary and laboratory) are listed below for reference.     Significant Diagnostic Studies: CT Head Wo Contrast  Result Date: 12/03/2019 CLINICAL DATA:  Multiple recent falls with headaches and confusion, initial encounter EXAM: CT HEAD WITHOUT CONTRAST CT CERVICAL SPINE WITHOUT CONTRAST TECHNIQUE: Multidetector CT imaging of the head and cervical spine was performed following the standard protocol without intravenous contrast. Multiplanar CT image reconstructions of the cervical spine were also generated. COMPARISON:  02/16/2019 FINDINGS: CT HEAD FINDINGS Brain: Chronic atrophic and ischemic changes are again noted and stable. No findings to suggest acute hemorrhage, acute infarction or space-occupying mass lesion are seen. Vascular: No hyperdense vessel or unexpected calcification. Skull: Normal. Negative for fracture or focal lesion. Sinuses/Orbits: No acute finding. Other: None. CT CERVICAL SPINE FINDINGS Alignment: Within normal limits. Skull base and vertebrae: 7 cervical segments are well visualized. Vertebral body height is well maintained. No acute fracture or acute facet abnormality is noted. Multilevel facet hypertrophic changes are seen. Disc space narrowing at C5-6 and C6-7 with associated osteophytes is noted. Soft tissues and spinal canal: Diffuse vascular calcifications are noted. No acute soft tissue abnormality is noted. Upper chest: Visualized lung apices are unremarkable. Other: None IMPRESSION: CT of the head: Chronic atrophic and ischemic changes without acute  abnormality. CT of the cervical spine: Multilevel degenerative change without acute abnormality. Electronically Signed   By: Inez Catalina M.D.   On: 12/03/2019 21:06   CT Cervical Spine Wo Contrast  Result Date: 12/03/2019 CLINICAL DATA:  Multiple recent falls with headaches and confusion, initial encounter EXAM: CT HEAD WITHOUT CONTRAST CT CERVICAL SPINE WITHOUT CONTRAST TECHNIQUE: Multidetector CT imaging of the head and cervical spine was performed following the standard protocol  without intravenous contrast. Multiplanar CT image reconstructions of the cervical spine were also generated. COMPARISON:  02/16/2019 FINDINGS: CT HEAD FINDINGS Brain: Chronic atrophic and ischemic changes are again noted and stable. No findings to suggest acute hemorrhage, acute infarction or space-occupying mass lesion are seen. Vascular: No hyperdense vessel or unexpected calcification. Skull: Normal. Negative for fracture or focal lesion. Sinuses/Orbits: No acute finding. Other: None. CT CERVICAL SPINE FINDINGS Alignment: Within normal limits. Skull base and vertebrae: 7 cervical segments are well visualized. Vertebral body height is well maintained. No acute fracture or acute facet abnormality is noted. Multilevel facet hypertrophic changes are seen. Disc space narrowing at C5-6 and C6-7 with associated osteophytes is noted. Soft tissues and spinal canal: Diffuse vascular calcifications are noted. No acute soft tissue abnormality is noted. Upper chest: Visualized lung apices are unremarkable. Other: None IMPRESSION: CT of the head: Chronic atrophic and ischemic changes without acute abnormality. CT of the cervical spine: Multilevel degenerative change without acute abnormality. Electronically Signed   By: Inez Catalina M.D.   On: 12/03/2019 21:06   MR THORACIC SPINE WO CONTRAST  Result Date: 12/04/2019 CLINICAL DATA:  Mid back pain. Suspected compression fracture. Question cord compression. EXAM: MRI THORACIC SPINE WITHOUT CONTRAST TECHNIQUE: Multiplanar, multisequence MR imaging of the thoracic spine was performed. No intravenous contrast was administered. COMPARISON:  Chest radiography 12/03/2019.  Abdominal CT 12/03/2019 FINDINGS: Alignment: Thoracic curvature convex to the right with the apex at T8-9. Vertebrae: Old healed minor compression deformity at T2 with loss of height of only 10-20%. Recent compression fracture at T12 with near complete loss of height centrally. Retropulsion of 9 mm, encroaching  upon the spinal canal. Effacement of the subarachnoid space without frank compression of the cord being demonstrated. Recent superior endplate fracture at L1 with loss of height of 30%. No retropulsed bone at that level. Cord: No primary cord lesion. As noted above, at the T12 level, despite 9 mm of retropulsion, cord itself does not appear grossly compressed, though the subarachnoid space surrounding it is effaced. Paraspinal and other soft tissues: Otherwise negative Disc levels: No significant disc space pathology in the thoracic region. Ordinary age related desiccation and loss of height. IMPRESSION: Recent compression fracture at T12 with near complete loss of height centrally. Retropulsed 9 mm encroaches upon the spinal canal, effacing the subarachnoid space at that level, but without definite cord compression/deformity. Recent superior endplate fracture at L1 with loss of height of 30%. No retropulsed bone at that level. Electronically Signed   By: Nelson Chimes M.D.   On: 12/04/2019 16:11   DG Chest Portable 1 View  Result Date: 12/03/2019 CLINICAL DATA:  86 year old male with altered mental status. EXAM: PORTABLE CHEST 1 VIEW COMPARISON:  Chest radiograph dated 09/11/2019. FINDINGS: Minimal left lung base density, likely atelectasis. Residual or recurrent infiltrate is less likely but not excluded clinical correlation is recommended. No lobar consolidation, pleural effusion, pneumothorax. Mild cardiomegaly. Atherosclerotic calcification of the aorta. Median sternotomy wires and CABG vascular clips. No acute osseous pathology. Right upper quadrant cholecystectomy clips. IMPRESSION: 1.  Probable atelectasis at the left lung base. 2. Mild cardiomegaly. Electronically Signed   By: Anner Crete M.D.   On: 12/03/2019 19:23   CT Renal Stone Study  Result Date: 12/03/2019 CLINICAL DATA:  86 year old male with left flank pain. Concern for kidney stone. EXAM: CT ABDOMEN AND PELVIS WITHOUT CONTRAST  TECHNIQUE: Multidetector CT imaging of the abdomen and pelvis was performed following the standard protocol without IV contrast. COMPARISON:  None. FINDINGS: Evaluation of this exam is limited in the absence of intravenous contrast. Lower chest: Left lung base subpleural density likely represents a small pleural effusion and associated left lung base atelectasis or infiltrate. Clinical correlation is recommended. Three vessel coronary vascular calcification. No intra-abdominal free air or free fluid. Hepatobiliary: The liver is unremarkable. No intrahepatic biliary ductal dilatation. Cholecystectomy. No retained calcified stone noted in the central CBD. Pancreas: Unremarkable. No pancreatic ductal dilatation or surrounding inflammatory changes. Spleen: Normal in size without focal abnormality. Adrenals/Urinary Tract: The adrenal glands are unremarkable. There is a 12 mm right renal upper pole nonobstructing stone. No hydronephrosis. Right renal interpolar hypodense lesion measures up to 4 cm. This is suboptimally characterized but demonstrates fluid attenuation, likely a cyst. There is no hydronephrosis or nephrolithiasis on the left. The visualized ureters and urinary bladder appear unremarkable. Stomach/Bowel: There is moderate stool throughout the colon. Several scattered sigmoid diverticula without active inflammatory changes. There is no bowel obstruction or active inflammation. The appendix is normal. Vascular/Lymphatic: Advanced aortoiliac atherosclerotic disease. The IVC is unremarkable. No portal venous gas. There is no adenopathy. Reproductive: The prostate and seminal vesicles are grossly unremarkable. No pelvic masses Other: None Musculoskeletal: Osteopenia with scoliosis and degenerative changes of the spine. There is compression fracture of the T12 vertebra with near complete loss of vertebral body height, age indeterminate but likely acute or subacute. There is approximately 9 mm retropulsion of the  posterior vertebral body. There is also compression fracture of the superior endplate of L1 with approximately 50% loss of vertebral body height. There is mild perivertebral hematoma at T12 with extension along the left pleural space. There is moderate to severe narrowing of the central canal at T12 due to retropulsion. Correlation with neurological exam recommended. If there is neurological deficit or concern for cord compression, further evaluation with MRI is advised. There is nondisplaced fracture of the left L1 transverse process. IMPRESSION: 1. Acute appearing compression fractures of the T12 with near complete loss of vertebral body height and 9 mm retropulsion of the posterior vertebral body. There is moderate to severe narrowing of the central canal at T12 due to retropulsion. Correlation with neurological exam recommended. If there is neurological deficit or concern for cord compression, further evaluation with MRI is advised. 2. Acute appearing compression fracture of superior endplate of L1 with approximately 50% loss of vertebral body height. 3. Small left pleural effusion or hemorrhagic fluid related to vertebral fracture. 4. A 12 mm nonobstructing right renal upper pole stone. No hydronephrosis. 5. Moderate colonic stool burden. No bowel obstruction. Normal appendix. 6. Aortic Atherosclerosis (ICD10-I70.0). Electronically Signed   By: Anner Crete M.D.   On: 12/03/2019 21:16    Microbiology: Recent Results (from the past 240 hour(s))  SARS Coronavirus 2 by RT PCR (hospital order, performed in [facility] hospital lab) Nasopharyngeal Nasopharyngeal Swab     Status: None   Collection Time: 12/03/19 10:20 PM   Specimen: Nasopharyngeal Swab  Result Value Ref Range Status   SARS Coronavirus 2 NEGATIVE NEGATIVE Final  Comment: (NOTE) SARS-CoV-2 target nucleic acids are NOT DETECTED. The SARS-CoV-2 RNA is generally detectable in upper and lower respiratory specimens during the acute phase  of infection. The lowest concentration of SARS-CoV-2 viral copies this assay can detect is 250 copies / mL. A negative result does not preclude SARS-CoV-2 infection and should not be used as the sole basis for treatment or other patient management decisions.  A negative result may occur with improper specimen collection / handling, submission of specimen other than nasopharyngeal swab, presence of viral mutation(s) within the areas targeted by this assay, and inadequate number of viral copies (<250 copies / mL). A negative result must be combined with clinical observations, patient history, and epidemiological information. Fact Sheet for Patients:   StrictlyIdeas.no Fact Sheet for Healthcare Providers: BankingDealers.co.za This test is not yet approved or cleared  by the [place] FDA and has been authorized for detection and/or diagnosis of SARS-CoV-2 by FDA under an Emergency Use Authorization (EUA).  This EUA will remain in effect (meaning this test can be used) for the duration of the COVID-19 declaration under Section 564(b)(1) of the Act, 21 U.S.C. section 360bbb-3(b)(1), unless the authorization is terminated or revoked sooner. Performed at [facility], [address], [place], [place] [address]   Urine Culture     Status: None   Collection Time: 12/05/19  7:39 AM   Specimen: Urine, Clean Catch  Result Value Ref Range Status   Specimen Description URINE, CLEAN CATCH  Final   Special Requests NONE  Final   Culture   Final    NO GROWTH Performed at [facility], [address] [place], [place] [address]    Report Status 12/06/2019 FINAL  Final  SARS CORONAVIRUS 2 (TAT 6-24 HRS)     Status: None   Collection Time: 12/10/19 12:00 PM  Result Value Ref Range Status   SARS Coronavirus 2 NEGATIVE NEGATIVE Final    Comment: (NOTE) SARS-CoV-2 target nucleic acids are NOT DETECTED. The SARS-CoV-2 RNA is generally detectable  in upper and lower respiratory specimens during the acute phase of infection. Negative results do not preclude SARS-CoV-2 infection, do not rule out co-infections with other pathogens, and should not be used as the sole basis for treatment or other patient management decisions. Negative results must be combined with clinical observations, patient history, and epidemiological information. The expected result is Negative. Fact Sheet for Patients: SugarRoll.be Fact Sheet for Healthcare Providers: https://www.woods-mathews.com/ This test is not yet approved or cleared by the [place] FDA and  has been authorized for detection and/or diagnosis of SARS-CoV-2 by FDA under an Emergency Use Authorization (EUA). This EUA will remain  in effect (meaning this test can be used) for the duration of the COVID-19 declaration under Section 56 4(b)(1) of the Act, 21 U.S.C. section 360bbb-3(b)(1), unless the authorization is terminated or revoked sooner. Performed at [facility], [place] [address], [place], [place] [address]      Labs: Basic Metabolic Panel: Recent Labs  Lab 12/07/19 0729 12/09/19 0303  NA 140 142  K 3.8 3.5  CL 109 108  CO2 20* 25  GLUCOSE 71 80  BUN 17 9  CREATININE 0.91 0.99  CALCIUM 8.7* 8.8*   Liver Function Tests: No results for input(s): AST, ALT, ALKPHOS, BILITOT, PROT, ALBUMIN in the last 168 hours. No results for input(s): LIPASE, AMYLASE in the last 168 hours. No results for input(s): AMMONIA in the last 168 hours. CBC: Recent Labs  Lab 12/07/19  4883 12/09/19 0303  WBC 5.0 3.9*  HGB 14.2 13.8  HCT 41.4 40.5  MCV 96.3 94.2  PLT 82* 87*    BNP (last 3 results) Recent Labs    09/11/19 1309 12/03/19 2022  BNP 1,832.0* 712.0*      Signed:  Oswald Hillock MD.  [facility] 12/13/2019, 12:30 PM

## 2019-12-13 NOTE — Progress Notes (Addendum)
1511: Report given to [name] at [facility].   Attempted to call report twice with no answer. Name number left with secretary for call back.

## 2019-12-18 DIAGNOSIS — I2510 Atherosclerotic heart disease of native coronary artery without angina pectoris: Secondary | ICD-10-CM | POA: Diagnosis not present

## 2019-12-18 DIAGNOSIS — M549 Dorsalgia, unspecified: Secondary | ICD-10-CM | POA: Diagnosis not present

## 2019-12-18 DIAGNOSIS — G9340 Encephalopathy, unspecified: Secondary | ICD-10-CM | POA: Diagnosis not present

## 2019-12-18 DIAGNOSIS — I10 Essential (primary) hypertension: Secondary | ICD-10-CM | POA: Diagnosis not present

## 2019-12-20 DIAGNOSIS — I10 Essential (primary) hypertension: Secondary | ICD-10-CM | POA: Diagnosis not present

## 2019-12-20 DIAGNOSIS — I2510 Atherosclerotic heart disease of native coronary artery without angina pectoris: Secondary | ICD-10-CM | POA: Diagnosis not present

## 2019-12-20 DIAGNOSIS — G9340 Encephalopathy, unspecified: Secondary | ICD-10-CM | POA: Diagnosis not present

## 2019-12-20 DIAGNOSIS — M549 Dorsalgia, unspecified: Secondary | ICD-10-CM | POA: Diagnosis not present

## 2019-12-27 DIAGNOSIS — G9340 Encephalopathy, unspecified: Secondary | ICD-10-CM | POA: Diagnosis not present

## 2019-12-27 DIAGNOSIS — I2510 Atherosclerotic heart disease of native coronary artery without angina pectoris: Secondary | ICD-10-CM | POA: Diagnosis not present

## 2019-12-27 DIAGNOSIS — I10 Essential (primary) hypertension: Secondary | ICD-10-CM | POA: Diagnosis not present

## 2019-12-27 DIAGNOSIS — M549 Dorsalgia, unspecified: Secondary | ICD-10-CM | POA: Diagnosis not present

## 2020-01-01 DIAGNOSIS — I2510 Atherosclerotic heart disease of native coronary artery without angina pectoris: Secondary | ICD-10-CM | POA: Diagnosis not present

## 2020-01-01 DIAGNOSIS — I252 Old myocardial infarction: Secondary | ICD-10-CM | POA: Diagnosis not present

## 2020-01-01 DIAGNOSIS — S22080D Wedge compression fracture of T11-T12 vertebra, subsequent encounter for fracture with routine healing: Secondary | ICD-10-CM | POA: Diagnosis not present

## 2020-01-01 DIAGNOSIS — I5040 Unspecified combined systolic (congestive) and diastolic (congestive) heart failure: Secondary | ICD-10-CM | POA: Diagnosis not present

## 2020-01-01 DIAGNOSIS — I130 Hypertensive heart and chronic kidney disease with heart failure and stage 1 through stage 4 chronic kidney disease, or unspecified chronic kidney disease: Secondary | ICD-10-CM | POA: Diagnosis not present

## 2020-01-01 DIAGNOSIS — Z8616 Personal history of COVID-19: Secondary | ICD-10-CM | POA: Diagnosis not present

## 2020-01-01 DIAGNOSIS — R1312 Dysphagia, oropharyngeal phase: Secondary | ICD-10-CM | POA: Diagnosis not present

## 2020-01-01 DIAGNOSIS — N1832 Chronic kidney disease, stage 3b: Secondary | ICD-10-CM | POA: Diagnosis not present

## 2020-01-01 DIAGNOSIS — Z9181 History of falling: Secondary | ICD-10-CM | POA: Diagnosis not present

## 2020-01-01 DIAGNOSIS — W19XXXD Unspecified fall, subsequent encounter: Secondary | ICD-10-CM | POA: Diagnosis not present

## 2020-01-01 DIAGNOSIS — Z951 Presence of aortocoronary bypass graft: Secondary | ICD-10-CM | POA: Diagnosis not present

## 2020-01-03 ENCOUNTER — Telehealth: Payer: Self-pay | Admitting: Nurse Practitioner

## 2020-01-03 DIAGNOSIS — N1832 Chronic kidney disease, stage 3b: Secondary | ICD-10-CM | POA: Diagnosis not present

## 2020-01-03 DIAGNOSIS — Z8616 Personal history of COVID-19: Secondary | ICD-10-CM | POA: Diagnosis not present

## 2020-01-03 DIAGNOSIS — S22080D Wedge compression fracture of T11-T12 vertebra, subsequent encounter for fracture with routine healing: Secondary | ICD-10-CM | POA: Diagnosis not present

## 2020-01-03 DIAGNOSIS — Z9181 History of falling: Secondary | ICD-10-CM | POA: Diagnosis not present

## 2020-01-03 DIAGNOSIS — I252 Old myocardial infarction: Secondary | ICD-10-CM | POA: Diagnosis not present

## 2020-01-03 DIAGNOSIS — I130 Hypertensive heart and chronic kidney disease with heart failure and stage 1 through stage 4 chronic kidney disease, or unspecified chronic kidney disease: Secondary | ICD-10-CM | POA: Diagnosis not present

## 2020-01-03 DIAGNOSIS — I2510 Atherosclerotic heart disease of native coronary artery without angina pectoris: Secondary | ICD-10-CM | POA: Diagnosis not present

## 2020-01-03 DIAGNOSIS — I5040 Unspecified combined systolic (congestive) and diastolic (congestive) heart failure: Secondary | ICD-10-CM | POA: Diagnosis not present

## 2020-01-03 DIAGNOSIS — Z951 Presence of aortocoronary bypass graft: Secondary | ICD-10-CM | POA: Diagnosis not present

## 2020-01-03 DIAGNOSIS — R1312 Dysphagia, oropharyngeal phase: Secondary | ICD-10-CM | POA: Diagnosis not present

## 2020-01-03 DIAGNOSIS — W19XXXD Unspecified fall, subsequent encounter: Secondary | ICD-10-CM | POA: Diagnosis not present

## 2020-01-03 NOTE — Telephone Encounter (Signed)
CB#[phone]  [facility] from [place] need a verbal order 1wk for 1 day & 2wks for 4wks

## 2020-01-08 DIAGNOSIS — S22080D Wedge compression fracture of T11-T12 vertebra, subsequent encounter for fracture with routine healing: Secondary | ICD-10-CM | POA: Diagnosis not present

## 2020-01-08 DIAGNOSIS — R1312 Dysphagia, oropharyngeal phase: Secondary | ICD-10-CM | POA: Diagnosis not present

## 2020-01-08 DIAGNOSIS — I130 Hypertensive heart and chronic kidney disease with heart failure and stage 1 through stage 4 chronic kidney disease, or unspecified chronic kidney disease: Secondary | ICD-10-CM | POA: Diagnosis not present

## 2020-01-08 DIAGNOSIS — I5040 Unspecified combined systolic (congestive) and diastolic (congestive) heart failure: Secondary | ICD-10-CM | POA: Diagnosis not present

## 2020-01-08 DIAGNOSIS — Z951 Presence of aortocoronary bypass graft: Secondary | ICD-10-CM | POA: Diagnosis not present

## 2020-01-08 DIAGNOSIS — Z9181 History of falling: Secondary | ICD-10-CM | POA: Diagnosis not present

## 2020-01-08 DIAGNOSIS — W19XXXD Unspecified fall, subsequent encounter: Secondary | ICD-10-CM | POA: Diagnosis not present

## 2020-01-08 DIAGNOSIS — N1832 Chronic kidney disease, stage 3b: Secondary | ICD-10-CM | POA: Diagnosis not present

## 2020-01-08 DIAGNOSIS — I2510 Atherosclerotic heart disease of native coronary artery without angina pectoris: Secondary | ICD-10-CM | POA: Diagnosis not present

## 2020-01-08 DIAGNOSIS — Z8616 Personal history of COVID-19: Secondary | ICD-10-CM | POA: Diagnosis not present

## 2020-01-08 DIAGNOSIS — I252 Old myocardial infarction: Secondary | ICD-10-CM | POA: Diagnosis not present

## 2020-01-08 NOTE — Telephone Encounter (Signed)
Verbal order was given by Dyke Maes, CMA

## 2020-01-09 ENCOUNTER — Telehealth: Payer: Self-pay

## 2020-01-09 NOTE — Telephone Encounter (Signed)
Verbal orders given to Amy at [facility] For 2x 6 weeks, 1x 3 weeks

## 2020-01-09 NOTE — Telephone Encounter (Addendum)
Amy from [place] [facility] called about [name] needing verbal orders for 2x 6 weeks and 1x 3 weeks for physical therapy. Orders were given for gait, balance, lower extremities strengthening.

## 2020-01-10 DIAGNOSIS — I130 Hypertensive heart and chronic kidney disease with heart failure and stage 1 through stage 4 chronic kidney disease, or unspecified chronic kidney disease: Secondary | ICD-10-CM | POA: Diagnosis not present

## 2020-01-10 DIAGNOSIS — I252 Old myocardial infarction: Secondary | ICD-10-CM | POA: Diagnosis not present

## 2020-01-10 DIAGNOSIS — Z9181 History of falling: Secondary | ICD-10-CM | POA: Diagnosis not present

## 2020-01-10 DIAGNOSIS — N1832 Chronic kidney disease, stage 3b: Secondary | ICD-10-CM | POA: Diagnosis not present

## 2020-01-10 DIAGNOSIS — S22080D Wedge compression fracture of T11-T12 vertebra, subsequent encounter for fracture with routine healing: Secondary | ICD-10-CM | POA: Diagnosis not present

## 2020-01-10 DIAGNOSIS — R1312 Dysphagia, oropharyngeal phase: Secondary | ICD-10-CM | POA: Diagnosis not present

## 2020-01-10 DIAGNOSIS — Z951 Presence of aortocoronary bypass graft: Secondary | ICD-10-CM | POA: Diagnosis not present

## 2020-01-10 DIAGNOSIS — Z8616 Personal history of COVID-19: Secondary | ICD-10-CM | POA: Diagnosis not present

## 2020-01-10 DIAGNOSIS — W19XXXD Unspecified fall, subsequent encounter: Secondary | ICD-10-CM | POA: Diagnosis not present

## 2020-01-10 DIAGNOSIS — I2510 Atherosclerotic heart disease of native coronary artery without angina pectoris: Secondary | ICD-10-CM | POA: Diagnosis not present

## 2020-01-10 DIAGNOSIS — I5040 Unspecified combined systolic (congestive) and diastolic (congestive) heart failure: Secondary | ICD-10-CM | POA: Diagnosis not present

## 2020-01-10 NOTE — Telephone Encounter (Signed)
Verbal orders approved and placed

## 2020-01-10 NOTE — Telephone Encounter (Signed)
Approve verbal orders

## 2020-01-14 DIAGNOSIS — I5040 Unspecified combined systolic (congestive) and diastolic (congestive) heart failure: Secondary | ICD-10-CM | POA: Diagnosis not present

## 2020-01-14 DIAGNOSIS — S22080D Wedge compression fracture of T11-T12 vertebra, subsequent encounter for fracture with routine healing: Secondary | ICD-10-CM | POA: Diagnosis not present

## 2020-01-14 DIAGNOSIS — Z951 Presence of aortocoronary bypass graft: Secondary | ICD-10-CM | POA: Diagnosis not present

## 2020-01-14 DIAGNOSIS — I2510 Atherosclerotic heart disease of native coronary artery without angina pectoris: Secondary | ICD-10-CM | POA: Diagnosis not present

## 2020-01-14 DIAGNOSIS — Z8616 Personal history of COVID-19: Secondary | ICD-10-CM | POA: Diagnosis not present

## 2020-01-14 DIAGNOSIS — I130 Hypertensive heart and chronic kidney disease with heart failure and stage 1 through stage 4 chronic kidney disease, or unspecified chronic kidney disease: Secondary | ICD-10-CM | POA: Diagnosis not present

## 2020-01-14 DIAGNOSIS — I252 Old myocardial infarction: Secondary | ICD-10-CM | POA: Diagnosis not present

## 2020-01-14 DIAGNOSIS — R1312 Dysphagia, oropharyngeal phase: Secondary | ICD-10-CM | POA: Diagnosis not present

## 2020-01-14 DIAGNOSIS — Z9181 History of falling: Secondary | ICD-10-CM | POA: Diagnosis not present

## 2020-01-14 DIAGNOSIS — N1832 Chronic kidney disease, stage 3b: Secondary | ICD-10-CM | POA: Diagnosis not present

## 2020-01-14 DIAGNOSIS — W19XXXD Unspecified fall, subsequent encounter: Secondary | ICD-10-CM | POA: Diagnosis not present

## 2020-01-15 DIAGNOSIS — W19XXXD Unspecified fall, subsequent encounter: Secondary | ICD-10-CM | POA: Diagnosis not present

## 2020-01-15 DIAGNOSIS — Z951 Presence of aortocoronary bypass graft: Secondary | ICD-10-CM | POA: Diagnosis not present

## 2020-01-15 DIAGNOSIS — I5040 Unspecified combined systolic (congestive) and diastolic (congestive) heart failure: Secondary | ICD-10-CM | POA: Diagnosis not present

## 2020-01-15 DIAGNOSIS — M79675 Pain in left toe(s): Secondary | ICD-10-CM | POA: Diagnosis not present

## 2020-01-15 DIAGNOSIS — M79674 Pain in right toe(s): Secondary | ICD-10-CM | POA: Diagnosis not present

## 2020-01-15 DIAGNOSIS — Z8616 Personal history of COVID-19: Secondary | ICD-10-CM | POA: Diagnosis not present

## 2020-01-15 DIAGNOSIS — I252 Old myocardial infarction: Secondary | ICD-10-CM | POA: Diagnosis not present

## 2020-01-15 DIAGNOSIS — S22080D Wedge compression fracture of T11-T12 vertebra, subsequent encounter for fracture with routine healing: Secondary | ICD-10-CM | POA: Diagnosis not present

## 2020-01-15 DIAGNOSIS — Z9181 History of falling: Secondary | ICD-10-CM | POA: Diagnosis not present

## 2020-01-15 DIAGNOSIS — R1312 Dysphagia, oropharyngeal phase: Secondary | ICD-10-CM | POA: Diagnosis not present

## 2020-01-15 DIAGNOSIS — I130 Hypertensive heart and chronic kidney disease with heart failure and stage 1 through stage 4 chronic kidney disease, or unspecified chronic kidney disease: Secondary | ICD-10-CM | POA: Diagnosis not present

## 2020-01-15 DIAGNOSIS — N1832 Chronic kidney disease, stage 3b: Secondary | ICD-10-CM | POA: Diagnosis not present

## 2020-01-15 DIAGNOSIS — B351 Tinea unguium: Secondary | ICD-10-CM | POA: Diagnosis not present

## 2020-01-15 DIAGNOSIS — I2510 Atherosclerotic heart disease of native coronary artery without angina pectoris: Secondary | ICD-10-CM | POA: Diagnosis not present

## 2020-01-17 DIAGNOSIS — I2510 Atherosclerotic heart disease of native coronary artery without angina pectoris: Secondary | ICD-10-CM | POA: Diagnosis not present

## 2020-01-17 DIAGNOSIS — Z951 Presence of aortocoronary bypass graft: Secondary | ICD-10-CM | POA: Diagnosis not present

## 2020-01-17 DIAGNOSIS — R1312 Dysphagia, oropharyngeal phase: Secondary | ICD-10-CM | POA: Diagnosis not present

## 2020-01-17 DIAGNOSIS — I130 Hypertensive heart and chronic kidney disease with heart failure and stage 1 through stage 4 chronic kidney disease, or unspecified chronic kidney disease: Secondary | ICD-10-CM | POA: Diagnosis not present

## 2020-01-17 DIAGNOSIS — I5040 Unspecified combined systolic (congestive) and diastolic (congestive) heart failure: Secondary | ICD-10-CM | POA: Diagnosis not present

## 2020-01-17 DIAGNOSIS — I252 Old myocardial infarction: Secondary | ICD-10-CM | POA: Diagnosis not present

## 2020-01-17 DIAGNOSIS — W19XXXD Unspecified fall, subsequent encounter: Secondary | ICD-10-CM | POA: Diagnosis not present

## 2020-01-17 DIAGNOSIS — S22080D Wedge compression fracture of T11-T12 vertebra, subsequent encounter for fracture with routine healing: Secondary | ICD-10-CM | POA: Diagnosis not present

## 2020-01-17 DIAGNOSIS — Z9181 History of falling: Secondary | ICD-10-CM | POA: Diagnosis not present

## 2020-01-17 DIAGNOSIS — N1832 Chronic kidney disease, stage 3b: Secondary | ICD-10-CM | POA: Diagnosis not present

## 2020-01-17 DIAGNOSIS — Z8616 Personal history of COVID-19: Secondary | ICD-10-CM | POA: Diagnosis not present

## 2020-01-18 ENCOUNTER — Inpatient Hospital Stay: Payer: Medicare Other | Admitting: Oncology

## 2020-01-18 ENCOUNTER — Inpatient Hospital Stay: Payer: Medicare Other

## 2020-01-22 DIAGNOSIS — N1832 Chronic kidney disease, stage 3b: Secondary | ICD-10-CM | POA: Diagnosis not present

## 2020-01-22 DIAGNOSIS — Z8616 Personal history of COVID-19: Secondary | ICD-10-CM | POA: Diagnosis not present

## 2020-01-22 DIAGNOSIS — S22080D Wedge compression fracture of T11-T12 vertebra, subsequent encounter for fracture with routine healing: Secondary | ICD-10-CM | POA: Diagnosis not present

## 2020-01-22 DIAGNOSIS — R1312 Dysphagia, oropharyngeal phase: Secondary | ICD-10-CM | POA: Diagnosis not present

## 2020-01-22 DIAGNOSIS — Z951 Presence of aortocoronary bypass graft: Secondary | ICD-10-CM | POA: Diagnosis not present

## 2020-01-22 DIAGNOSIS — I130 Hypertensive heart and chronic kidney disease with heart failure and stage 1 through stage 4 chronic kidney disease, or unspecified chronic kidney disease: Secondary | ICD-10-CM | POA: Diagnosis not present

## 2020-01-22 DIAGNOSIS — Z9181 History of falling: Secondary | ICD-10-CM | POA: Diagnosis not present

## 2020-01-22 DIAGNOSIS — W19XXXD Unspecified fall, subsequent encounter: Secondary | ICD-10-CM | POA: Diagnosis not present

## 2020-01-22 DIAGNOSIS — I252 Old myocardial infarction: Secondary | ICD-10-CM | POA: Diagnosis not present

## 2020-01-22 DIAGNOSIS — I2510 Atherosclerotic heart disease of native coronary artery without angina pectoris: Secondary | ICD-10-CM | POA: Diagnosis not present

## 2020-01-22 DIAGNOSIS — I5040 Unspecified combined systolic (congestive) and diastolic (congestive) heart failure: Secondary | ICD-10-CM | POA: Diagnosis not present

## 2020-01-25 DIAGNOSIS — N1832 Chronic kidney disease, stage 3b: Secondary | ICD-10-CM | POA: Diagnosis not present

## 2020-01-25 DIAGNOSIS — Z8616 Personal history of COVID-19: Secondary | ICD-10-CM | POA: Diagnosis not present

## 2020-01-25 DIAGNOSIS — Z9181 History of falling: Secondary | ICD-10-CM | POA: Diagnosis not present

## 2020-01-25 DIAGNOSIS — I5040 Unspecified combined systolic (congestive) and diastolic (congestive) heart failure: Secondary | ICD-10-CM | POA: Diagnosis not present

## 2020-01-25 DIAGNOSIS — Z951 Presence of aortocoronary bypass graft: Secondary | ICD-10-CM | POA: Diagnosis not present

## 2020-01-25 DIAGNOSIS — S22080D Wedge compression fracture of T11-T12 vertebra, subsequent encounter for fracture with routine healing: Secondary | ICD-10-CM | POA: Diagnosis not present

## 2020-01-25 DIAGNOSIS — I2510 Atherosclerotic heart disease of native coronary artery without angina pectoris: Secondary | ICD-10-CM | POA: Diagnosis not present

## 2020-01-25 DIAGNOSIS — I130 Hypertensive heart and chronic kidney disease with heart failure and stage 1 through stage 4 chronic kidney disease, or unspecified chronic kidney disease: Secondary | ICD-10-CM | POA: Diagnosis not present

## 2020-01-25 DIAGNOSIS — R1312 Dysphagia, oropharyngeal phase: Secondary | ICD-10-CM | POA: Diagnosis not present

## 2020-01-25 DIAGNOSIS — W19XXXD Unspecified fall, subsequent encounter: Secondary | ICD-10-CM | POA: Diagnosis not present

## 2020-01-25 DIAGNOSIS — I252 Old myocardial infarction: Secondary | ICD-10-CM | POA: Diagnosis not present

## 2020-01-28 DIAGNOSIS — R1312 Dysphagia, oropharyngeal phase: Secondary | ICD-10-CM | POA: Diagnosis not present

## 2020-01-28 DIAGNOSIS — W19XXXD Unspecified fall, subsequent encounter: Secondary | ICD-10-CM | POA: Diagnosis not present

## 2020-01-28 DIAGNOSIS — S22080D Wedge compression fracture of T11-T12 vertebra, subsequent encounter for fracture with routine healing: Secondary | ICD-10-CM | POA: Diagnosis not present

## 2020-01-28 DIAGNOSIS — N1832 Chronic kidney disease, stage 3b: Secondary | ICD-10-CM | POA: Diagnosis not present

## 2020-01-28 DIAGNOSIS — I5040 Unspecified combined systolic (congestive) and diastolic (congestive) heart failure: Secondary | ICD-10-CM | POA: Diagnosis not present

## 2020-01-28 DIAGNOSIS — Z951 Presence of aortocoronary bypass graft: Secondary | ICD-10-CM | POA: Diagnosis not present

## 2020-01-28 DIAGNOSIS — I2510 Atherosclerotic heart disease of native coronary artery without angina pectoris: Secondary | ICD-10-CM | POA: Diagnosis not present

## 2020-01-28 DIAGNOSIS — I252 Old myocardial infarction: Secondary | ICD-10-CM | POA: Diagnosis not present

## 2020-01-28 DIAGNOSIS — I130 Hypertensive heart and chronic kidney disease with heart failure and stage 1 through stage 4 chronic kidney disease, or unspecified chronic kidney disease: Secondary | ICD-10-CM | POA: Diagnosis not present

## 2020-01-28 DIAGNOSIS — Z9181 History of falling: Secondary | ICD-10-CM | POA: Diagnosis not present

## 2020-01-28 DIAGNOSIS — Z8616 Personal history of COVID-19: Secondary | ICD-10-CM | POA: Diagnosis not present

## 2020-01-29 ENCOUNTER — Ambulatory Visit: Payer: Medicare Other | Admitting: Nurse Practitioner

## 2020-01-31 DIAGNOSIS — R1312 Dysphagia, oropharyngeal phase: Secondary | ICD-10-CM | POA: Diagnosis not present

## 2020-01-31 DIAGNOSIS — I2510 Atherosclerotic heart disease of native coronary artery without angina pectoris: Secondary | ICD-10-CM | POA: Diagnosis not present

## 2020-01-31 DIAGNOSIS — Z951 Presence of aortocoronary bypass graft: Secondary | ICD-10-CM | POA: Diagnosis not present

## 2020-01-31 DIAGNOSIS — Z8616 Personal history of COVID-19: Secondary | ICD-10-CM | POA: Diagnosis not present

## 2020-01-31 DIAGNOSIS — I5040 Unspecified combined systolic (congestive) and diastolic (congestive) heart failure: Secondary | ICD-10-CM | POA: Diagnosis not present

## 2020-01-31 DIAGNOSIS — W19XXXD Unspecified fall, subsequent encounter: Secondary | ICD-10-CM | POA: Diagnosis not present

## 2020-01-31 DIAGNOSIS — I252 Old myocardial infarction: Secondary | ICD-10-CM | POA: Diagnosis not present

## 2020-01-31 DIAGNOSIS — Z9181 History of falling: Secondary | ICD-10-CM | POA: Diagnosis not present

## 2020-01-31 DIAGNOSIS — N1832 Chronic kidney disease, stage 3b: Secondary | ICD-10-CM | POA: Diagnosis not present

## 2020-01-31 DIAGNOSIS — I130 Hypertensive heart and chronic kidney disease with heart failure and stage 1 through stage 4 chronic kidney disease, or unspecified chronic kidney disease: Secondary | ICD-10-CM | POA: Diagnosis not present

## 2020-01-31 DIAGNOSIS — S22080D Wedge compression fracture of T11-T12 vertebra, subsequent encounter for fracture with routine healing: Secondary | ICD-10-CM | POA: Diagnosis not present

## 2020-02-04 ENCOUNTER — Other Ambulatory Visit: Payer: Self-pay

## 2020-02-04 ENCOUNTER — Ambulatory Visit (INDEPENDENT_AMBULATORY_CARE_PROVIDER_SITE_OTHER): Payer: Medicare Other | Admitting: Nurse Practitioner

## 2020-02-04 VITALS — BP 140/74 | HR 87 | Temp 97.6°F | Resp 18 | Wt 161.2 lb

## 2020-02-04 DIAGNOSIS — G8929 Other chronic pain: Secondary | ICD-10-CM | POA: Diagnosis not present

## 2020-02-04 DIAGNOSIS — M549 Dorsalgia, unspecified: Secondary | ICD-10-CM

## 2020-02-04 DIAGNOSIS — Z09 Encounter for follow-up examination after completed treatment for conditions other than malignant neoplasm: Secondary | ICD-10-CM

## 2020-02-04 NOTE — Progress Notes (Signed)
Established Patient Office Visit  Subjective:  Patient ID: John [name], male    DOB: 1930/06/26  Age: 86 y.o. MRN: [id number]  CC:  Chief Complaint  Patient presents with  . Follow-up    3 month    HPI John [name] is a 86 year old male accompanied by [name] assisted living caregiver today as he is now living in an assisted living x 1 moth. He presents today for a follow up. He did not take Flexeril for his back pain as his daughter reported that "it makes him crazy" per the caregiver". Pt has been taken off of plavix by cardiology therefore discussed Ibuprofen 200-400 mg tablets every 8-10 hours severe pain not resolved with tylenol for chronic back pain.   Also the pt has a 1 cm round opened area just epidermal level draining serous drainage on a bandage placed today reported found today by the caregiver. No foul odor, no bleeding, no purulent drainage, no injury known. The pt is not aware of stated either, the pt denied sxs as stated, denied pruritis or pain. Instruction added to the [facility] treatment plan today.     The pt reports he is enjoying his new home. He denied cp, ct, gu/gi sxs, pain, sob, edema, palpitation, injury, or falls.   Past Medical History:  Diagnosis Date  . Acute ST elevation myocardial infarction (STEMI) of posterior wall ([place]) 11/11/2015  . CKD (chronic kidney disease), stage III   . Coronary atherosclerosis of native coronary artery    a. Multivessel status post CABG in 1992. b. STEMI 11/2015 s/p DES to insertion site of LIMA-LAD.  [name] Dilated aortic root ([place])   . Essential hypertension   . Ischemic cardiomyopathy   . LVEF <40% 11/28/2019  . Malignant melanoma of skin   . Mixed hyperlipidemia   . Multiple falls   . Nephrolithiasis   . OSA (obstructive sleep apnea)   . Pneumonia due to COVID-19 virus    March 2021  . Rosacea   . Thrombocytopenia ([place])     Past Surgical History:  Procedure Laterality Date  . CARDIAC CATHETERIZATION  07/11/90  .  CARDIAC CATHETERIZATION N/A 11/11/2015   Procedure: Left Heart Cath and Coronary Angiography;  Surgeon: [name], MD;  Location: [facility];  Service: Cardiovascular;  Laterality: N/A;  . CARDIAC CATHETERIZATION N/A 11/14/2015   Procedure: Coronary Stent Intervention w/Impella;  Surgeon: [name], MD;  Location: [facility];  Service: Cardiovascular;  Laterality: N/A;  . carotid duplex  06/24/2011, 05/12/2009   Right and Left ICAs: Demonstrates a small amount of irregular mixed density plaque with no evidence of diameter reduction, significant tortuosity or any other vascular abnormality. This is a mildly abnormal carotid duplex doppler evaluation.  [name] CATARACT EXTRACTION W/PHACO Right 02/11/2014   Procedure: CATARACT EXTRACTION PHACO AND INTRAOCULAR LENS PLACEMENT (I[facility]);  Surgeon: [name], MD;  Location: [facility];  Service: Ophthalmology;  Laterality: Right;  CDE 13.83  . CHOLECYSTECTOMY    . CORONARY ARTERY BYPASS GRAFT  1992  . DOPPLER ECHOCARDIOGRAPHY  05/23/2007   Left ventricular systolic function is normal. The transmitral spectral doppler flow pattern is suggestive of impaired LV relaxation. No significant valvular abnormalities.  . heart monitor  04/01/2009   palpitations  . HERNIA REPAIR    . MELANOMA EXCISION     neck  . NM MYOCAR PERF EJECTION FRACTION  02/10/2011   The post stress myocaardial perfusion images show a normal pattern of perfusion  in all regions. No significant wall motion abnormalities noted. EKG is negative for ischemia. This is a low risk scan.  [name] TOTAL KNEE ARTHROPLASTY      Family History  Problem Relation Age of Onset  . Heart attack Father   . Skin cancer Brother   . Heart attack Brother     Social History   Socioeconomic History  . Marital status: Widowed    Spouse name: Not on file  . Number of children: Not on file  . Years of education: Not on file  . Highest education level: Not on file  Occupational History  . Not on file   Tobacco Use  . Smoking status: Former Smoker    Types: Cigarettes    Start date: 07/06/1943    Quit date: 07/05/1981    Years since quitting: 38.6  . Smokeless tobacco: Never Used  Vaping Use  . Vaping Use: Never used  Substance and Sexual Activity  . Alcohol use: No    Alcohol/week: 0.0 standard drinks  . Drug use: No  . Sexual activity: Not Currently    Partners: Female  Other Topics Concern  . Not on file  Social History Narrative  . Not on file   Social Determinants of Health   Financial Resource Strain:   . Difficulty of Paying Living Expenses:   Food Insecurity:   . Worried About Charity fundraiser in the Last Year:   . Arboriculturist in the Last Year:   Transportation Needs:   . Film/video editor (Medical):   [name] Lack of Transportation (Non-Medical):   Physical Activity:   . Days of Exercise per Week:   . Minutes of Exercise per Session:   Stress:   . Feeling of Stress :   Social Connections:   . Frequency of Communication with Friends and Family:   . Frequency of Social Gatherings with Friends and Family:   . Attends Religious Services:   . Active Member of Clubs or Organizations:   . Attends Archivist Meetings:   [name] Marital Status:   Intimate Partner Violence:   . Fear of Current or Ex-Partner:   . Emotionally Abused:   [name] Physically Abused:   . Sexually Abused:     Outpatient Medications Prior to Visit  Medication Sig Dispense Refill  . acetaminophen (TYLENOL) 500 MG tablet Take 2 tablets (1,000 mg total) by mouth every 8 (eight) hours as needed for moderate pain. 30 tablet 0  . aspirin EC 81 MG tablet Take 81 mg by mouth daily.    . fludrocortisone (FLORINEF) 0.1 MG tablet Take 100 mcg by mouth daily.    . furosemide (LASIX) 20 MG tablet Take 1 tablet (20 mg total) by mouth daily. 90 tablet 3  . lisinopril (ZESTRIL) 5 MG tablet Take 1 tablet (5 mg total) by mouth daily. 30 tablet 11  . metoprolol succinate (TOPROL-XL) 25 MG 24 hr tablet  Take 1 tablet by mouth twice daily (Patient taking differently: Take 25 mg by mouth in the morning and at bedtime. ) 180 tablet 1  . neomycin-polymyxin b-dexamethasone (MAXITROL) 3.5-10000-0.1 OINT Place 1 application into the left eye daily.     . nitroGLYCERIN (NITROSTAT) 0.4 MG SL tablet Place 1 tablet (0.4 mg total) under the tongue every 5 (five) minutes x 3 doses as needed for chest pain. 25 tablet 2  . pantoprazole (PROTONIX) 40 MG tablet Take 40 mg by mouth daily.    . potassium chloride  SA (KLOR-CON) 20 MEQ tablet Take 2 tablets (40 mEq total) by mouth daily. (Patient taking differently: Take 20 mEq by mouth daily. ) 60 tablet 3  . spironolactone (ALDACTONE) 25 MG tablet Take 0.5 tablets (12.5 mg total) by mouth daily. 90 tablet 3  . tamsulosin (FLOMAX) 0.4 MG CAPS capsule Take 1 capsule (0.4 mg total) by mouth daily. 90 capsule 3  . clotrimazole-betamethasone (LOTRISONE) cream Apply 1 application topically 2 (two) times daily as needed (for irritation).     . cyclobenzaprine (FLEXERIL) 5 MG tablet Take 1 tablet (5 mg total) by mouth 2 (two) times daily as needed for muscle spasms. (Patient not taking: Reported on 12/03/2019) 30 tablet 1   No facility-administered medications prior to visit.    Allergies  Allergen Reactions  . Statins Other (See Comments)    Weak,nervous  . Xanax [Alprazolam] Other (See Comments)    Hallucination  . Tape Rash    ROS Review of Systems  All other systems reviewed and are negative.     Objective:    Physical Exam Vitals and nursing note reviewed. Exam conducted with a chaperone present.  Constitutional:      General: He is not in acute distress.    Appearance: Normal appearance. He is not ill-appearing, toxic-appearing or diaphoretic.  HENT:     Head: Normocephalic.     Right Ear: External ear normal.     Left Ear: External ear normal.  Eyes:     Extraocular Movements: Extraocular movements intact.     Conjunctiva/sclera: Conjunctivae  normal.     Pupils: Pupils are equal, round, and reactive to light.  Cardiovascular:     Rate and Rhythm: Normal rate and regular rhythm.     Pulses: Normal pulses.     Heart sounds: Normal heart sounds.  Pulmonary:     Effort: Pulmonary effort is normal.     Breath sounds: Normal breath sounds.  Musculoskeletal:     Cervical back: Normal range of motion and neck supple.     Right lower leg: No edema.     Left lower leg: No edema.     Comments: Motor strength 5/5 bilateral LE, sensation intact BLE, no limitation ROM at hip joint, uses walker for ambulation assistance  Skin:    General: Skin is warm and dry.     Capillary Refill: Capillary refill takes less than 2 seconds.     Coloration: Skin is not jaundiced or pale.     Findings: No rash.  Neurological:     General: No focal deficit present.     Mental Status: He is alert and oriented to person, place, and time. Mental status is at baseline.  Psychiatric:        Mood and Affect: Mood normal.        Behavior: Behavior normal.     BP (!) 140/74 (BP Location: Right Arm, Patient Position: Sitting, Cuff Size: Normal)   Pulse 87   Temp 97.6 F (36.4 C) (Temporal)   Resp 18   Wt 161 lb 3.2 oz (73.1 kg)   SpO2 98%   BMI 22.48 kg/m  Wt Readings from Last 3 Encounters:  02/04/20 161 lb 3.2 oz (73.1 kg)  12/10/19 178 lb 12.7 oz (81.1 kg)  11/28/19 170 lb 12.8 oz (77.5 kg)     Health Maintenance Due  Topic Date Due  . COVID-19 Vaccine (1) Never done  . PNA vac Low Risk Adult (2 of 2 - PPSV23) 12/14/2014  .  INFLUENZA VACCINE  02/03/2020    There are no preventive care reminders to display for this patient.  Lab Results  Component Value Date   TSH 1.701 12/03/2019   Lab Results  Component Value Date   WBC 3.9 (L) 12/09/2019   HGB 13.8 12/09/2019   HCT 40.5 12/09/2019   MCV 94.2 12/09/2019   PLT 87 (L) 12/09/2019   Lab Results  Component Value Date   NA 142 12/09/2019   K 3.5 12/09/2019   CHLORIDE 108  12/18/2015   CO2 25 12/09/2019   GLUCOSE 80 12/09/2019   BUN 9 12/09/2019   CREATININE 0.99 12/09/2019   BILITOT 3.5 (H) 12/04/2019   ALKPHOS 68 12/04/2019   AST 41 12/04/2019   ALT 32 12/04/2019   PROT 5.8 (L) 12/04/2019   ALBUMIN 3.4 (L) 12/04/2019   CALCIUM 8.8 (L) 12/09/2019   ANIONGAP 9 12/09/2019   EGFR 58 (L) 12/18/2015   Lab Results  Component Value Date   CHOL 170 10/29/2019   Lab Results  Component Value Date   HDL 44 10/29/2019   Lab Results  Component Value Date   LDLCALC 105 (H) 10/29/2019   Lab Results  Component Value Date   TRIG 111 10/29/2019   Lab Results  Component Value Date   CHOLHDL 3.9 10/29/2019   Lab Results  Component Value Date   HGBA1C 5.9 (H) 11/11/2015      Assessment & Plan:   Problem List Items Addressed This Visit      Other   Chronic back pain    Other Visit Diagnoses    Follow-up exam, 3-6 months since previous exam    -  Primary    Pt living at Assisted Living: updated ordered for back pain management now that pt is no longer taking Plavix as Cardiology discontinued the pt may take Ibuprofen for his chronic back pain 200 mg-400 mg every 8 hours as needed for sever back pain and tylenol to be continued 1000 mg every 8 hours as needed for mild to moderate pain to be given 4 hour apart from ibuprofen.   To the left neck/eunder ear area 1 cm circular open skin area: clean with liquid antibacterial soap leaving on 15-20 seconds twice daily rinsing with warm water, then applying neosporin and bandage twice daily. If not resolved within two weeks see the A.L. dermatologist. For worsening, new, or non resolving sxs seek medical attention.      Follow-up: Return in about 6 months (around 08/06/2020).    Annie Main, FNP

## 2020-02-05 DIAGNOSIS — S22080D Wedge compression fracture of T11-T12 vertebra, subsequent encounter for fracture with routine healing: Secondary | ICD-10-CM | POA: Diagnosis not present

## 2020-02-05 DIAGNOSIS — I130 Hypertensive heart and chronic kidney disease with heart failure and stage 1 through stage 4 chronic kidney disease, or unspecified chronic kidney disease: Secondary | ICD-10-CM | POA: Diagnosis not present

## 2020-02-05 DIAGNOSIS — N1832 Chronic kidney disease, stage 3b: Secondary | ICD-10-CM | POA: Diagnosis not present

## 2020-02-05 DIAGNOSIS — R1312 Dysphagia, oropharyngeal phase: Secondary | ICD-10-CM | POA: Diagnosis not present

## 2020-02-05 DIAGNOSIS — Z8616 Personal history of COVID-19: Secondary | ICD-10-CM | POA: Diagnosis not present

## 2020-02-05 DIAGNOSIS — Z9181 History of falling: Secondary | ICD-10-CM | POA: Diagnosis not present

## 2020-02-05 DIAGNOSIS — I252 Old myocardial infarction: Secondary | ICD-10-CM | POA: Diagnosis not present

## 2020-02-05 DIAGNOSIS — Z951 Presence of aortocoronary bypass graft: Secondary | ICD-10-CM | POA: Diagnosis not present

## 2020-02-05 DIAGNOSIS — W19XXXD Unspecified fall, subsequent encounter: Secondary | ICD-10-CM | POA: Diagnosis not present

## 2020-02-05 DIAGNOSIS — I2510 Atherosclerotic heart disease of native coronary artery without angina pectoris: Secondary | ICD-10-CM | POA: Diagnosis not present

## 2020-02-05 DIAGNOSIS — I5040 Unspecified combined systolic (congestive) and diastolic (congestive) heart failure: Secondary | ICD-10-CM | POA: Diagnosis not present

## 2020-02-05 NOTE — Patient Instructions (Signed)
Pt living at Assisted Living: updated ordered for back pain management now that pt is no longer taking Plavix as Cardiology discontinued the pt may take Ibuprofen for his chronic back pain 200 mg-400 mg every 8 hours as needed for sever back pain and tylenol to be continued 1000 mg every 8 hours as needed for mild to moderate pain to be given 4 hour apart from ibuprofen.   To the left neck/eunder ear area 1 cm circular open skin area: clean with liquid antibacterial soap leaving on 15-20 seconds twice daily rinsing with warm water, then applying neosporin and bandage twice daily. If not resolved within two weeks see the A.L. dermatologist. For worsening, new, or non resolving sxs seek medical attention.      Follow-up: Return in about 6 months (around 08/06/2020).

## 2020-02-07 DIAGNOSIS — N1832 Chronic kidney disease, stage 3b: Secondary | ICD-10-CM | POA: Diagnosis not present

## 2020-02-07 DIAGNOSIS — Z951 Presence of aortocoronary bypass graft: Secondary | ICD-10-CM | POA: Diagnosis not present

## 2020-02-07 DIAGNOSIS — Z8616 Personal history of COVID-19: Secondary | ICD-10-CM | POA: Diagnosis not present

## 2020-02-07 DIAGNOSIS — S22080D Wedge compression fracture of T11-T12 vertebra, subsequent encounter for fracture with routine healing: Secondary | ICD-10-CM | POA: Diagnosis not present

## 2020-02-07 DIAGNOSIS — W19XXXD Unspecified fall, subsequent encounter: Secondary | ICD-10-CM | POA: Diagnosis not present

## 2020-02-07 DIAGNOSIS — I130 Hypertensive heart and chronic kidney disease with heart failure and stage 1 through stage 4 chronic kidney disease, or unspecified chronic kidney disease: Secondary | ICD-10-CM | POA: Diagnosis not present

## 2020-02-07 DIAGNOSIS — I252 Old myocardial infarction: Secondary | ICD-10-CM | POA: Diagnosis not present

## 2020-02-07 DIAGNOSIS — R1312 Dysphagia, oropharyngeal phase: Secondary | ICD-10-CM | POA: Diagnosis not present

## 2020-02-07 DIAGNOSIS — Z9181 History of falling: Secondary | ICD-10-CM | POA: Diagnosis not present

## 2020-02-07 DIAGNOSIS — I5040 Unspecified combined systolic (congestive) and diastolic (congestive) heart failure: Secondary | ICD-10-CM | POA: Diagnosis not present

## 2020-02-07 DIAGNOSIS — I2510 Atherosclerotic heart disease of native coronary artery without angina pectoris: Secondary | ICD-10-CM | POA: Diagnosis not present

## 2020-02-11 DIAGNOSIS — Z1283 Encounter for screening for malignant neoplasm of skin: Secondary | ICD-10-CM | POA: Diagnosis not present

## 2020-02-11 DIAGNOSIS — D0439 Carcinoma in situ of skin of other parts of face: Secondary | ICD-10-CM | POA: Diagnosis not present

## 2020-02-11 DIAGNOSIS — D225 Melanocytic nevi of trunk: Secondary | ICD-10-CM | POA: Diagnosis not present

## 2020-02-11 DIAGNOSIS — Z85820 Personal history of malignant melanoma of skin: Secondary | ICD-10-CM | POA: Diagnosis not present

## 2020-02-11 DIAGNOSIS — Z08 Encounter for follow-up examination after completed treatment for malignant neoplasm: Secondary | ICD-10-CM | POA: Diagnosis not present

## 2020-02-11 DIAGNOSIS — L570 Actinic keratosis: Secondary | ICD-10-CM | POA: Diagnosis not present

## 2020-02-11 DIAGNOSIS — D045 Carcinoma in situ of skin of trunk: Secondary | ICD-10-CM | POA: Diagnosis not present

## 2020-02-12 DIAGNOSIS — N1832 Chronic kidney disease, stage 3b: Secondary | ICD-10-CM | POA: Diagnosis not present

## 2020-02-12 DIAGNOSIS — I2510 Atherosclerotic heart disease of native coronary artery without angina pectoris: Secondary | ICD-10-CM | POA: Diagnosis not present

## 2020-02-12 DIAGNOSIS — I252 Old myocardial infarction: Secondary | ICD-10-CM | POA: Diagnosis not present

## 2020-02-12 DIAGNOSIS — Z9181 History of falling: Secondary | ICD-10-CM | POA: Diagnosis not present

## 2020-02-12 DIAGNOSIS — I130 Hypertensive heart and chronic kidney disease with heart failure and stage 1 through stage 4 chronic kidney disease, or unspecified chronic kidney disease: Secondary | ICD-10-CM | POA: Diagnosis not present

## 2020-02-12 DIAGNOSIS — Z8616 Personal history of COVID-19: Secondary | ICD-10-CM | POA: Diagnosis not present

## 2020-02-12 DIAGNOSIS — Z951 Presence of aortocoronary bypass graft: Secondary | ICD-10-CM | POA: Diagnosis not present

## 2020-02-12 DIAGNOSIS — I5040 Unspecified combined systolic (congestive) and diastolic (congestive) heart failure: Secondary | ICD-10-CM | POA: Diagnosis not present

## 2020-02-12 DIAGNOSIS — W19XXXD Unspecified fall, subsequent encounter: Secondary | ICD-10-CM | POA: Diagnosis not present

## 2020-02-12 DIAGNOSIS — R1312 Dysphagia, oropharyngeal phase: Secondary | ICD-10-CM | POA: Diagnosis not present

## 2020-02-12 DIAGNOSIS — S22080D Wedge compression fracture of T11-T12 vertebra, subsequent encounter for fracture with routine healing: Secondary | ICD-10-CM | POA: Diagnosis not present

## 2020-02-18 DIAGNOSIS — I2510 Atherosclerotic heart disease of native coronary artery without angina pectoris: Secondary | ICD-10-CM | POA: Diagnosis not present

## 2020-02-18 DIAGNOSIS — R1312 Dysphagia, oropharyngeal phase: Secondary | ICD-10-CM | POA: Diagnosis not present

## 2020-02-18 DIAGNOSIS — I252 Old myocardial infarction: Secondary | ICD-10-CM | POA: Diagnosis not present

## 2020-02-18 DIAGNOSIS — Z951 Presence of aortocoronary bypass graft: Secondary | ICD-10-CM | POA: Diagnosis not present

## 2020-02-18 DIAGNOSIS — S22080D Wedge compression fracture of T11-T12 vertebra, subsequent encounter for fracture with routine healing: Secondary | ICD-10-CM | POA: Diagnosis not present

## 2020-02-18 DIAGNOSIS — Z8616 Personal history of COVID-19: Secondary | ICD-10-CM | POA: Diagnosis not present

## 2020-02-18 DIAGNOSIS — I5040 Unspecified combined systolic (congestive) and diastolic (congestive) heart failure: Secondary | ICD-10-CM | POA: Diagnosis not present

## 2020-02-18 DIAGNOSIS — N1832 Chronic kidney disease, stage 3b: Secondary | ICD-10-CM | POA: Diagnosis not present

## 2020-02-18 DIAGNOSIS — W19XXXD Unspecified fall, subsequent encounter: Secondary | ICD-10-CM | POA: Diagnosis not present

## 2020-02-18 DIAGNOSIS — Z9181 History of falling: Secondary | ICD-10-CM | POA: Diagnosis not present

## 2020-02-18 DIAGNOSIS — I130 Hypertensive heart and chronic kidney disease with heart failure and stage 1 through stage 4 chronic kidney disease, or unspecified chronic kidney disease: Secondary | ICD-10-CM | POA: Diagnosis not present

## 2020-02-22 ENCOUNTER — Other Ambulatory Visit (HOSPITAL_COMMUNITY)
Admission: RE | Admit: 2020-02-22 | Discharge: 2020-02-22 | Disposition: A | Discharge status: Home / Self Care | Payer: Medicare Other | Source: Ambulatory Visit | Attending: Cardiology | Admitting: Cardiology

## 2020-02-22 DIAGNOSIS — I25119 Atherosclerotic heart disease of native coronary artery with unspecified angina pectoris: Secondary | ICD-10-CM | POA: Insufficient documentation

## 2020-02-22 LAB — BASIC METABOLIC PANEL
Anion gap: 9 (ref 5–15)
BUN: 27 mg/dL — ABNORMAL HIGH (ref 8–23)
CO2: 25 mmol/L (ref 22–32)
Calcium: 9.3 mg/dL (ref 8.9–10.3)
Chloride: 106 mmol/L (ref 98–111)
Creatinine, Ser: 1.28 mg/dL — ABNORMAL HIGH (ref 0.61–1.24)
GFR calc Af Amer: 58 mL/min — ABNORMAL LOW (ref >60)
GFR calc non Af Amer: 50 mL/min — ABNORMAL LOW (ref >60)
Glucose, Bld: 127 mg/dL — ABNORMAL HIGH (ref 70–99)
Potassium: 3.8 mmol/L (ref 3.5–5.1)
Sodium: 140 mmol/L (ref 135–145)

## 2020-02-25 DIAGNOSIS — S22080D Wedge compression fracture of T11-T12 vertebra, subsequent encounter for fracture with routine healing: Secondary | ICD-10-CM | POA: Diagnosis not present

## 2020-02-25 DIAGNOSIS — I130 Hypertensive heart and chronic kidney disease with heart failure and stage 1 through stage 4 chronic kidney disease, or unspecified chronic kidney disease: Secondary | ICD-10-CM | POA: Diagnosis not present

## 2020-02-25 DIAGNOSIS — Z8616 Personal history of COVID-19: Secondary | ICD-10-CM | POA: Diagnosis not present

## 2020-02-25 DIAGNOSIS — W19XXXD Unspecified fall, subsequent encounter: Secondary | ICD-10-CM | POA: Diagnosis not present

## 2020-02-25 DIAGNOSIS — I252 Old myocardial infarction: Secondary | ICD-10-CM | POA: Diagnosis not present

## 2020-02-25 DIAGNOSIS — R1312 Dysphagia, oropharyngeal phase: Secondary | ICD-10-CM | POA: Diagnosis not present

## 2020-02-25 DIAGNOSIS — Z951 Presence of aortocoronary bypass graft: Secondary | ICD-10-CM | POA: Diagnosis not present

## 2020-02-25 DIAGNOSIS — N1832 Chronic kidney disease, stage 3b: Secondary | ICD-10-CM | POA: Diagnosis not present

## 2020-02-25 DIAGNOSIS — Z9181 History of falling: Secondary | ICD-10-CM | POA: Diagnosis not present

## 2020-02-25 DIAGNOSIS — I2510 Atherosclerotic heart disease of native coronary artery without angina pectoris: Secondary | ICD-10-CM | POA: Diagnosis not present

## 2020-02-25 DIAGNOSIS — I5040 Unspecified combined systolic (congestive) and diastolic (congestive) heart failure: Secondary | ICD-10-CM | POA: Diagnosis not present

## 2020-02-28 ENCOUNTER — Encounter: Payer: Self-pay | Admitting: Student

## 2020-02-28 ENCOUNTER — Telehealth: Payer: Self-pay | Admitting: *Deleted

## 2020-02-28 ENCOUNTER — Other Ambulatory Visit: Payer: Self-pay

## 2020-02-28 ENCOUNTER — Ambulatory Visit: Payer: Medicare Other | Admitting: Student

## 2020-02-28 VITALS — BP 148/82 | HR 70 | Ht 71.0 in | Wt 163.6 lb

## 2020-02-28 DIAGNOSIS — Z79899 Other long term (current) drug therapy: Secondary | ICD-10-CM

## 2020-02-28 DIAGNOSIS — I10 Essential (primary) hypertension: Secondary | ICD-10-CM | POA: Diagnosis not present

## 2020-02-28 DIAGNOSIS — N1831 Chronic kidney disease, stage 3a: Secondary | ICD-10-CM

## 2020-02-28 DIAGNOSIS — I5042 Chronic combined systolic (congestive) and diastolic (congestive) heart failure: Secondary | ICD-10-CM

## 2020-02-28 DIAGNOSIS — I25118 Atherosclerotic heart disease of native coronary artery with other forms of angina pectoris: Secondary | ICD-10-CM | POA: Diagnosis not present

## 2020-02-28 MED ORDER — ENTRESTO 24-26 MG PO TABS: 1.0000 | ORAL_TABLET | Freq: Two times a day (BID) | ORAL | 11 refills | Status: DC

## 2020-02-28 NOTE — Telephone Encounter (Signed)
Called patient with test results. No answer. Left message to call back.  

## 2020-02-28 NOTE — Patient Instructions (Signed)
Medication Instructions:  Your physician has recommended you make the following change in your medication:   Take Lasix 20 mg Two Times Daily for 3 Days then resume 20 mg Daily .  Stop Taking Lisinopril Today  Start Entresto 24-26 mg Two Times Daily on Saturday.    *If you need a refill on your cardiac medications before your next appointment, please call your pharmacy*   Lab Work: Your physician recommends that you return for lab work in: 2 Weeks   If you have labs (blood work) drawn today and your tests are completely normal, you will receive your results only by: [name] MyChart Message (if you have MyChart) OR . A paper copy in the mail If you have any lab test that is abnormal or we need to change your treatment, we will call you to review the results.   Testing/Procedures: NONE    Follow-Up: At [facility], you and your health needs are our priority.  As part of our continuing mission to provide you with exceptional heart care, we have created designated Provider Care Teams.  These Care Teams include your primary Cardiologist (physician) and Advanced Practice Providers (APPs -  Physician Assistants and Nurse Practitioners) who all work together to provide you with the care you need, when you need it.  We recommend signing up for the patient portal called "MyChart".  Sign up information is provided on this After Visit Summary.  MyChart is used to connect with patients for Virtual Visits (Telemedicine).  Patients are able to view lab/test results, encounter notes, upcoming appointments, etc.  Non-urgent messages can be sent to your provider as well.   To learn more about what you can do with MyChart, go to [email or web address].    Your next appointment:   2 month(s)  The format for your next appointment:   In Person  Provider:   [name], MD   Other Instructions Thank you for choosing [facility]!

## 2020-02-28 NOTE — Progress Notes (Signed)
Cardiology Office Note    Date:  02/28/2020   ID:  John [name], DOB 05-Apr-1930, MRN [id number]  PCP:  Annie Main, FNP  Cardiologist: [name], MD    Chief Complaint  Patient presents with  . Follow-up    3 month visit    History of Present Illness:    John [name] is a 86 y.o. male with past medical history of CAD (s/p CABG in 1992, STEMI in 11/2015 with DES to LIMA-LAD insertion site), chronic combined systolic and diastolic CHF/ICM (EF 54-56% in 11/2015, at 25-30% by repeat echo in 06/2019), HTN, HLD, OSA and chronic thrombocytopenia who presents to the office today for 20-month follow-up.  He was last examined by Dr. Domenic Polite in 11/2019 reported stable dyspnea on exertion but denied any associated chest pain or palpitations. Plavix was discontinued given the timeframe since his last intervention. He was continued on Toprol-XL, Entresto and Lasix for his cardiomyopathy with Spironolactone 12.5 mg daily being added to his medication regimen.  In the interim, he was admitted to [facility] from 12/03/2019 to 12/13/2019 for evaluation of altered mental status and frequent falls. He was found to have a compression fracture of T10 and T12. He was not felt to be a surgical candidate and conservative management was pursued. [name] was discontinued during admission with Lisinopril 5mg  daily being added due to soft BP and concerns for dehydration. He was initially discharged to SNF and has transitioned to [facility].   In talking with the patient today and one of his caregivers from [place], he reports overall doing well with participating in physical therapy. Was initially wheelchair bound but now ambulating with a cane. He has noticed worsening dyspnea on exertion over the past several days when walking to the dining room. Denies any specific orthopnea , PND or lower extremity. No associated chest pain or palpitations.   Past Medical History:  Diagnosis Date  . Acute ST  elevation myocardial infarction (STEMI) of posterior wall ([place]) 11/11/2015  . CKD (chronic kidney disease), stage III   . Coronary atherosclerosis of native coronary artery    a. Multivessel status post CABG in 1992. b. STEMI 11/2015 s/p DES to insertion site of LIMA-LAD.  [name] Dilated aortic root ([place])   . Essential hypertension   . Ischemic cardiomyopathy   . LVEF <40% 11/28/2019  . Malignant melanoma of skin   . Mixed hyperlipidemia   . Multiple falls   . Nephrolithiasis   . OSA (obstructive sleep apnea)   . Pneumonia due to COVID-19 virus    March 2021  . Rosacea   . Thrombocytopenia ([place])     Past Surgical History:  Procedure Laterality Date  . CARDIAC CATHETERIZATION  07/11/90  . CARDIAC CATHETERIZATION N/A 11/11/2015   Procedure: Left Heart Cath and Coronary Angiography;  Surgeon: [name], MD;  Location: [place] [facility];  Service: Cardiovascular;  Laterality: N/A;  . CARDIAC CATHETERIZATION N/A 11/14/2015   Procedure: Coronary Stent Intervention w/Impella;  Surgeon: [name], MD;  Location: [place] [facility];  Service: Cardiovascular;  Laterality: N/A;  . carotid duplex  06/24/2011, 05/12/2009   Right and Left ICAs: Demonstrates a small amount of irregular mixed density plaque with no evidence of diameter reduction, significant tortuosity or any other vascular abnormality. This is a mildly abnormal carotid duplex doppler evaluation.  [name] CATARACT EXTRACTION W/PHACO Right 02/11/2014   Procedure: CATARACT EXTRACTION PHACO AND INTRAOCULAR LENS PLACEMENT (IOC);  Surgeon: [name], MD;  Location: [facility];  Service: Ophthalmology;  Laterality: Right;  CDE 13.83  . CHOLECYSTECTOMY    . CORONARY ARTERY BYPASS GRAFT  1992  . DOPPLER ECHOCARDIOGRAPHY  05/23/2007   Left ventricular systolic function is normal. The transmitral spectral doppler flow pattern is suggestive of impaired LV relaxation. No significant valvular abnormalities.  . heart monitor  04/01/2009   palpitations  .  HERNIA REPAIR    . MELANOMA EXCISION     neck  . NM MYOCAR PERF EJECTION FRACTION  02/10/2011   The post stress myocaardial perfusion images show a normal pattern of perfusion in all regions. No significant wall motion abnormalities noted. EKG is negative for ischemia. This is a low risk scan.  [name] TOTAL KNEE ARTHROPLASTY      Current Medications: Outpatient Medications Prior to Visit  Medication Sig Dispense Refill  . acetaminophen (TYLENOL) 500 MG tablet Take 2 tablets (1,000 mg total) by mouth every 8 (eight) hours as needed for moderate pain. 30 tablet 0  . aspirin EC 81 MG tablet Take 81 mg by mouth daily.    . fludrocortisone (FLORINEF) 0.1 MG tablet Take 100 mcg by mouth daily.    . furosemide (LASIX) 20 MG tablet Take 1 tablet (20 mg total) by mouth daily. 90 tablet 3  . ibuprofen (ADVIL) 200 MG tablet Take 400 mg by mouth every 8 (eight) hours as needed.    . metoprolol succinate (TOPROL-XL) 25 MG 24 hr tablet Take 1 tablet by mouth twice daily 180 tablet 1  . neomycin-polymyxin b-dexamethasone (MAXITROL) 3.5-10000-0.1 OINT Place 1 application into the left eye daily.     . nitroGLYCERIN (NITROSTAT) 0.4 MG SL tablet Place 1 tablet (0.4 mg total) under the tongue every 5 (five) minutes x 3 doses as needed for chest pain. 25 tablet 2  . pantoprazole (PROTONIX) 40 MG tablet Take 40 mg by mouth daily.    . potassium chloride SA (KLOR-CON) 20 MEQ tablet Take 2 tablets (40 mEq total) by mouth daily. 60 tablet 3  . spironolactone (ALDACTONE) 25 MG tablet Take 0.5 tablets (12.5 mg total) by mouth daily. 90 tablet 3  . tamsulosin (FLOMAX) 0.4 MG CAPS capsule Take 1 capsule (0.4 mg total) by mouth daily. 90 capsule 3  . lisinopril (ZESTRIL) 5 MG tablet Take 1 tablet (5 mg total) by mouth daily. 30 tablet 11  . clotrimazole-betamethasone (LOTRISONE) cream Apply 1 application topically 2 (two) times daily as needed (for irritation).  (Patient not taking: Reported on 02/28/2020)     No  facility-administered medications prior to visit.     Allergies:   Statins, Xanax [alprazolam], and Tape   Social History   Socioeconomic History  . Marital status: Widowed    Spouse name: Not on file  . Number of children: Not on file  . Years of education: Not on file  . Highest education level: Not on file  Occupational History  . Not on file  Tobacco Use  . Smoking status: Former Smoker    Types: Cigarettes    Start date: 07/06/1943    Quit date: 07/05/1981    Years since quitting: 38.6  . Smokeless tobacco: Never Used  Vaping Use  . Vaping Use: Never used  Substance and Sexual Activity  . Alcohol use: No    Alcohol/week: 0.0 standard drinks  . Drug use: No  . Sexual activity: Not Currently    Partners: Female  Other Topics Concern  . Not on file  Social History Narrative  . Not  on file   Social Determinants of Health   Financial Resource Strain:   . Difficulty of Paying Living Expenses: Not on file  Food Insecurity:   . Worried About Charity fundraiser in the Last Year: Not on file  . Ran Out of Food in the Last Year: Not on file  Transportation Needs:   . Lack of Transportation (Medical): Not on file  . Lack of Transportation (Non-Medical): Not on file  Physical Activity:   . Days of Exercise per Week: Not on file  . Minutes of Exercise per Session: Not on file  Stress:   . Feeling of Stress : Not on file  Social Connections:   . Frequency of Communication with Friends and Family: Not on file  . Frequency of Social Gatherings with Friends and Family: Not on file  . Attends Religious Services: Not on file  . Active Member of Clubs or Organizations: Not on file  . Attends Archivist Meetings: Not on file  . Marital Status: Not on file     Family History:  The patient's family history includes Heart attack in his brother and father; Skin cancer in his brother.   Review of Systems:   Please see the history of present illness.     General:  No  chills, fever, night sweats or weight changes. Positive for back pain.  Cardiovascular:  No chest pain, edema, orthopnea, palpitations, paroxysmal nocturnal dyspnea. Positive for dyspnea on exertion.  Dermatological: No rash, lesions/masses Respiratory: No cough, dyspnea Urologic: No hematuria, dysuria Abdominal:   No nausea, vomiting, diarrhea, bright red blood per rectum, melena, or hematemesis Neurologic:  No visual changes, wkns, changes in mental status. All other systems reviewed and are otherwise negative except as noted above.   Physical Exam:    VS:  BP (!) 148/82   Pulse 70   Ht 5\' 11"  (1.803 m)   Wt 163 lb 9.6 oz (74.2 kg)   SpO2 98%   BMI 22.82 kg/m    General: Well developed, elderly male appearing in no acute distress. Hard of hearing.  Head: Normocephalic, atraumatic. Neck: No carotid bruits. JVD not elevated.  Lungs: Respirations regular and unlabored. Mild rales along left base.  Heart: Regular rate and rhythm. No S3 or S4.  No murmur, no rubs, or gallops appreciated. Abdomen: Appears non-distended. No obvious abdominal masses. Msk:  Strength and tone appear normal for age. No obvious joint deformities or effusions. Extremities: No clubbing or cyanosis. Trace lower extremity edema bilaterally.  Distal pedal pulses are 2+ bilaterally. Neuro: Alert and oriented X 3. Moves all extremities spontaneously. No focal deficits noted. Psych:  Responds to questions appropriately with a normal affect. Skin: No rashes or lesions noted  Wt Readings from Last 3 Encounters:  02/28/20 163 lb 9.6 oz (74.2 kg)  02/04/20 161 lb 3.2 oz (73.1 kg)  12/10/19 178 lb 12.7 oz (81.1 kg)    Studies/Labs Reviewed:   EKG:  EKG is not ordered today.    Recent Labs: 12/03/2019: B Natriuretic Peptide 712.0; TSH 1.701 12/04/2019: ALT 32 12/06/2019: Magnesium 1.9 12/09/2019: Hemoglobin 13.8; Platelets 87 02/22/2020: BUN 27; Creatinine, Ser 1.28; Potassium 3.8; Sodium 140   Lipid Panel      Component Value Date/Time   CHOL 170 10/29/2019 1113   TRIG 111 10/29/2019 1113   HDL 44 10/29/2019 1113   CHOLHDL 3.9 10/29/2019 1113   VLDL 41 (H) 11/12/2015 0432   LDLCALC 105 (H) 10/29/2019 1113  Additional studies/ records that were reviewed today include:   Echocardiogram: 06/2019 IMPRESSIONS    1. Left ventricular ejection fraction, by visual estimation, is 25 to  30%. The left ventricle has moderate to severely decreased function. There  is no left ventricular hypertrophy.  2. Severely dilated left ventricular internal cavity size.  3. The left ventricle demonstrates global hypokinesis.  4. Global right ventricle has normal systolic function.The right  ventricular size is normal. No increase in right ventricular wall  thickness.  5. Left atrial size was mildly dilated.  6. Right atrial size was normal.  7. The mitral valve is normal in structure. Mild mitral valve  regurgitation.  8. The tricuspid valve is normal in structure. Tricuspid valve  regurgitation is mild.  9. The aortic valve is tricuspid. Aortic valve regurgitation is mild.  10. Pulmonic regurgitation is mild.  11. The pulmonic valve was not well visualized. Pulmonic valve  regurgitation is mild.  12. The inferior vena cava is normal in size with greater than 50%  respiratory variability, suggesting right atrial pressure of 3 mmHg.    Assessment:    1. Coronary artery disease of native artery of native heart with stable angina pectoris ([place])   2. Chronic combined systolic and diastolic heart failure ([place])   3. Medication management   4. Essential hypertension   5. Stage 3a chronic kidney disease      Plan:   In order of problems listed above:  1. CAD - He is s/p CABG in 1992 and had a STEMI in 11/2015 with DES to LIMA-LAD insertion site. - He does report dyspnea on exertion but denies any recent chest pain. Continue with medical therapy at this time.  - Remains on ASA 81mg  daily  and Toprol-XL 25mg  BID. Previously intolerant to statin therapy and by review of notes he has not been interested in alternative medical options including PCSK-9 inhibitor therapy.   2. Chronic Combined Systolic and Diastolic CHF/ICM - His EF was previously 40-45% in 11/2015, further reduced at 25-30% by repeat echo in 06/2019. He does report dyspnea on exertion but denies any orthopnea, PND or edema. Given mild rales on examination, I recommended they increase Lasix from 20mg  daily to 40mg  daily for 3 days then resume his normal dosing. [name] was stopped during his recent admission due to hypotension but he is now hypertensive (at 148/82 today). Will stop Lisinopril, allow for a 36+ hour washout period, then restart Entresto 24-26mg  BID. Repeat BMET in 2 weeks. Continue Toprol-XL 25mg  BID and Spironolactone 12.5mg  daily.   3. HTN - BP is elevated at 148/82 during today's visit with similar results by recheck. Will switch from Lisinopril to [facility] as outlined above. Continue other medications at his current dosing.   4. Stage 2-3 CKD - Baseline creatinine 1.0 - 1.1 (peaked at 1.78 earlier this year). Was at 1.28 on 02/22/2020. Will plan for a repeat BMET in 2 weeks following medication adjustments.    Medication Adjustments/Labs and Tests Ordered: Current medicines are reviewed at length with the patient today.  Concerns regarding medicines are outlined above.  Medication changes, Labs and Tests ordered today are listed in the Patient Instructions below. Patient Instructions  Medication Instructions:  Your physician has recommended you make the following change in your medication:   Take Lasix 20 mg Two Times Daily for 3 Days then resume 20 mg Daily .  Stop Taking Lisinopril Today  Start Entresto 24-26 mg Two Times Daily on Saturday.    *  If you need a refill on your cardiac medications before your next appointment, please call your pharmacy*   Lab Work: Your physician recommends that  you return for lab work in: 2 Weeks   If you have labs (blood work) drawn today and your tests are completely normal, you will receive your results only by: [name] MyChart Message (if you have MyChart) OR . A paper copy in the mail If you have any lab test that is abnormal or we need to change your treatment, we will call you to review the results.   Testing/Procedures: NONE    Follow-Up: At [facility], you and your health needs are our priority.  As part of our continuing mission to provide you with exceptional heart care, we have created designated Provider Care Teams.  These Care Teams include your primary Cardiologist (physician) and Advanced Practice Providers (APPs -  Physician Assistants and Nurse Practitioners) who all work together to provide you with the care you need, when you need it.  We recommend signing up for the patient portal called "MyChart".  Sign up information is provided on this After Visit Summary.  MyChart is used to connect with patients for Virtual Visits (Telemedicine).  Patients are able to view lab/test results, encounter notes, upcoming appointments, etc.  Non-urgent messages can be sent to your provider as well.   To learn more about what you can do with MyChart, go to [email or web address].    Your next appointment:   2 month(s)  The format for your next appointment:   In Person  Provider:   [name], MD   Other Instructions Thank you for choosing [facility]!       Signed, Erma Heritage, PA-C  02/28/2020 8:28 PM    [facility]. [address] [place], [place] [address] Phone: [phone] Fax: [phone]

## 2020-02-28 NOTE — Telephone Encounter (Signed)
-----   Message from [name], MD sent at 02/22/2020 10:20 AM EDT ----- Results reviewed.  Creatinine has increased mildly to 1.28 from 0.99 and potassium normal after initiation of low-dose Aldactone.  Would continue same regimen for now without further up titration.  Keep scheduled visit next week.

## 2020-03-13 ENCOUNTER — Other Ambulatory Visit: Payer: Self-pay

## 2020-03-13 ENCOUNTER — Other Ambulatory Visit (HOSPITAL_COMMUNITY)
Admission: RE | Admit: 2020-03-13 | Discharge: 2020-03-13 | Disposition: A | Discharge status: Home / Self Care | Payer: Medicare Other | Source: Ambulatory Visit | Attending: Student | Admitting: Student

## 2020-03-13 ENCOUNTER — Telehealth: Payer: Self-pay | Admitting: *Deleted

## 2020-03-13 DIAGNOSIS — Z79899 Other long term (current) drug therapy: Secondary | ICD-10-CM

## 2020-03-13 DIAGNOSIS — I10 Essential (primary) hypertension: Secondary | ICD-10-CM

## 2020-03-13 LAB — BASIC METABOLIC PANEL WITH GFR
Anion gap: 8 (ref 5–15)
BUN: 22 mg/dL (ref 8–23)
CO2: 25 mmol/L (ref 22–32)
Calcium: 9.3 mg/dL (ref 8.9–10.3)
Chloride: 106 mmol/L (ref 98–111)
Creatinine, Ser: 1.25 mg/dL — ABNORMAL HIGH (ref 0.61–1.24)
GFR calc Af Amer: 59 mL/min — ABNORMAL LOW
GFR calc non Af Amer: 51 mL/min — ABNORMAL LOW
Glucose, Bld: 79 mg/dL (ref 70–99)
Potassium: 4.4 mmol/L (ref 3.5–5.1)
Sodium: 139 mmol/L (ref 135–145)

## 2020-03-13 NOTE — Telephone Encounter (Signed)
Kelle Darting at [facility] notified and order placed and faxed to [phone]

## 2020-03-13 NOTE — Telephone Encounter (Signed)
-----   Message from Isaiah Serge, NP sent at 03/13/2020  3:00 PM EDT ----- Labs stable, continue current meds.  Recheck BMP in 1 month

## 2020-03-17 ENCOUNTER — Telehealth: Payer: Self-pay | Admitting: Student

## 2020-03-17 NOTE — Telephone Encounter (Signed)
New message     When do you want them to draw the BMET for this patient?

## 2020-03-17 NOTE — Telephone Encounter (Signed)
Per phone note of 9/9, labs to be repeated in 1 month, [facility] notified

## 2020-04-10 ENCOUNTER — Other Ambulatory Visit: Payer: Self-pay

## 2020-04-10 ENCOUNTER — Other Ambulatory Visit (HOSPITAL_COMMUNITY)
Admission: RE | Admit: 2020-04-10 | Discharge: 2020-04-10 | Disposition: A | Discharge status: Home / Self Care | Payer: Medicare Other | Source: Ambulatory Visit | Attending: Cardiology | Admitting: Cardiology

## 2020-04-10 DIAGNOSIS — Z79899 Other long term (current) drug therapy: Secondary | ICD-10-CM | POA: Insufficient documentation

## 2020-04-10 DIAGNOSIS — I10 Essential (primary) hypertension: Secondary | ICD-10-CM | POA: Diagnosis present

## 2020-04-10 LAB — BASIC METABOLIC PANEL
Anion gap: 8 (ref 5–15)
BUN: 23 mg/dL (ref 8–23)
CO2: 26 mmol/L (ref 22–32)
Calcium: 9.2 mg/dL (ref 8.9–10.3)
Chloride: 103 mmol/L (ref 98–111)
Creatinine, Ser: 1.3 mg/dL — ABNORMAL HIGH (ref 0.61–1.24)
GFR calc non Af Amer: 49 mL/min — ABNORMAL LOW (ref >60)
Glucose, Bld: 101 mg/dL — ABNORMAL HIGH (ref 70–99)
Potassium: 4.6 mmol/L (ref 3.5–5.1)
Sodium: 137 mmol/L (ref 135–145)

## 2020-04-23 ENCOUNTER — Ambulatory Visit (INDEPENDENT_AMBULATORY_CARE_PROVIDER_SITE_OTHER): Payer: Medicare Other | Admitting: Urology

## 2020-04-23 ENCOUNTER — Encounter: Payer: Self-pay | Admitting: Urology

## 2020-04-23 ENCOUNTER — Other Ambulatory Visit: Payer: Self-pay

## 2020-04-23 VITALS — BP 135/81 | HR 74 | Temp 97.5°F | Ht 71.0 in | Wt 156.0 lb

## 2020-04-23 DIAGNOSIS — R339 Retention of urine, unspecified: Secondary | ICD-10-CM

## 2020-04-23 DIAGNOSIS — N401 Enlarged prostate with lower urinary tract symptoms: Secondary | ICD-10-CM

## 2020-04-23 DIAGNOSIS — N138 Other obstructive and reflux uropathy: Secondary | ICD-10-CM | POA: Diagnosis not present

## 2020-04-23 LAB — BLADDER SCAN AMB NON-IMAGING: Scan Result: 20

## 2020-04-23 MED ORDER — TAMSULOSIN HCL 0.4 MG PO CAPS: 0.4000 mg | ORAL_CAPSULE | Freq: Every day | ORAL | 3 refills | Status: AC

## 2020-04-23 NOTE — Progress Notes (Signed)

## 2020-04-23 NOTE — Progress Notes (Signed)
04/23/2020 11:59 AM   John [name] John [name] 1929-09-03 [id number]  Referring provider: No referring provider defined for this encounter.  Followup urinary retention  HPI: John [name] is a 86yo here for followup for BPH and urinary retention. He has been doing well since last visit. PVR 20cc. Urine stream strong. Nocturia 1-2x. No incontinence. No hesitancy. No urgency or frequnce.    PMH: Past Medical History:  Diagnosis Date  . Acute ST elevation myocardial infarction (STEMI) of posterior wall ([place]) 11/11/2015  . CKD (chronic kidney disease), stage III ([place])   . Coronary atherosclerosis of native coronary artery    a. Multivessel status post CABG in 1992. b. STEMI 11/2015 s/p DES to insertion site of LIMA-LAD.  [name] Dilated aortic root ([place])   . Essential hypertension   . Ischemic cardiomyopathy   . LVEF <40% 11/28/2019  . Malignant melanoma of skin   . Mixed hyperlipidemia   . Multiple falls   . Nephrolithiasis   . OSA (obstructive sleep apnea)   . Pneumonia due to COVID-19 virus    March 2021  . Rosacea   . Thrombocytopenia [facility])     Surgical History: Past Surgical History:  Procedure Laterality Date  . CARDIAC CATHETERIZATION  07/11/90  . CARDIAC CATHETERIZATION N/A 11/11/2015   Procedure: Left Heart Cath and Coronary Angiography;  Surgeon: [name], MD;  Location: [facility];  Service: Cardiovascular;  Laterality: N/A;  . CARDIAC CATHETERIZATION N/A 11/14/2015   Procedure: Coronary Stent Intervention w/Impella;  Surgeon: [name], MD;  Location: [facility];  Service: Cardiovascular;  Laterality: N/A;  . carotid duplex  06/24/2011, 05/12/2009   Right and Left ICAs: Demonstrates a small amount of irregular mixed density plaque with no evidence of diameter reduction, significant tortuosity or any other vascular abnormality. This is a mildly abnormal carotid duplex doppler evaluation.  [name] CATARACT EXTRACTION W/PHACO Right 02/11/2014   Procedure: CATARACT  EXTRACTION PHACO AND INTRAOCULAR LENS PLACEMENT (IOC);  Surgeon: [name], MD;  Location: [facility];  Service: Ophthalmology;  Laterality: Right;  CDE 13.83  . CHOLECYSTECTOMY    . CORONARY ARTERY BYPASS GRAFT  1992  . DOPPLER ECHOCARDIOGRAPHY  05/23/2007   Left ventricular systolic function is normal. The transmitral spectral doppler flow pattern is suggestive of impaired LV relaxation. No significant valvular abnormalities.  . heart monitor  04/01/2009   palpitations  . HERNIA REPAIR    . MELANOMA EXCISION     neck  . NM MYOCAR PERF EJECTION FRACTION  02/10/2011   The post stress myocaardial perfusion images show a normal pattern of perfusion in all regions. No significant wall motion abnormalities noted. EKG is negative for ischemia. This is a low risk scan.  [name] TOTAL KNEE ARTHROPLASTY      Home Medications:  Allergies as of 04/23/2020      Reactions   Statins Other (See Comments)   Weak,nervous   Xanax [alprazolam] Other (See Comments)   Hallucination   Tape Rash      Medication List       Accurate as of April 23, 2020 11:59 AM. If you have any questions, ask your nurse or doctor.        acetaminophen 500 MG tablet Commonly known as: TYLENOL Take 2 tablets (1,000 mg total) by mouth every 8 (eight) hours as needed for moderate pain.   aspirin EC 81 MG tablet Take 81 mg by mouth daily.   Entresto 24-26 MG Generic drug: sacubitril-valsartan Take 1 tablet by  mouth 2 (two) times daily.   fludrocortisone 0.1 MG tablet Commonly known as: FLORINEF Take 100 mcg by mouth daily.   furosemide 20 MG tablet Commonly known as: LASIX Take 1 tablet (20 mg total) by mouth daily.   ibuprofen 200 MG tablet Commonly known as: ADVIL Take 400 mg by mouth every 8 (eight) hours as needed.   metoprolol succinate 25 MG 24 hr tablet Commonly known as: TOPROL-XL Take 1 tablet by mouth twice daily   neomycin-polymyxin b-dexamethasone 3.5-10000-0.1 Oint Commonly known as:  MAXITROL Place 1 application into the left eye daily.   nitroGLYCERIN 0.4 MG SL tablet Commonly known as: NITROSTAT Place 1 tablet (0.4 mg total) under the tongue every 5 (five) minutes x 3 doses as needed for chest pain.   pantoprazole 40 MG tablet Commonly known as: PROTONIX Take 40 mg by mouth daily.   potassium chloride SA 20 MEQ tablet Commonly known as: KLOR-CON Take 2 tablets (40 mEq total) by mouth daily.   spironolactone 25 MG tablet Commonly known as: ALDACTONE Take 0.5 tablets (12.5 mg total) by mouth daily.   tamsulosin 0.4 MG Caps capsule Commonly known as: FLOMAX Take 1 capsule (0.4 mg total) by mouth daily.       Allergies:  Allergies  Allergen Reactions  . Statins Other (See Comments)    Weak,nervous  . Xanax [Alprazolam] Other (See Comments)    Hallucination  . Tape Rash    Family History: Family History  Problem Relation Age of Onset  . Heart attack Father   . Skin cancer Brother   . Heart attack Brother     Social History:  reports that he quit smoking about 38 years ago. His smoking use included cigarettes. He started smoking about 76 years ago. He has never used smokeless tobacco. He reports that he does not drink alcohol and does not use drugs.  ROS: All other review of systems were reviewed and are negative except what is noted above in HPI  Physical Exam: BP 135/81   Pulse 74   Temp (!) 97.5 F (36.4 C)   Ht 5\' 11"  (1.803 m)   Wt 156 lb (70.8 kg)   BMI 21.76 kg/m   Constitutional:  Alert and oriented, No acute distress. HEENT: [place] AT, moist mucus membranes.  Trachea midline, no masses. Cardiovascular: No clubbing, cyanosis, or edema. Respiratory: Normal respiratory effort, no increased work of breathing. GI: Abdomen is soft, nontender, nondistended, no abdominal masses GU: No CVA tenderness.  Lymph: No cervical or inguinal lymphadenopathy. Skin: No rashes, bruises or suspicious lesions. Neurologic: Grossly intact, no focal  deficits, moving all 4 extremities. Psychiatric: Normal mood and affect.  Laboratory Data: Lab Results  Component Value Date   WBC 3.9 (L) 12/09/2019   HGB 13.8 12/09/2019   HCT 40.5 12/09/2019   MCV 94.2 12/09/2019   PLT 87 (L) 12/09/2019    Lab Results  Component Value Date   CREATININE 1.30 (H) 04/10/2020    No results found for: PSA  No results found for: TESTOSTERONE  Lab Results  Component Value Date   HGBA1C 5.9 (H) 11/11/2015    Urinalysis    Component Value Date/Time   COLORURINE YELLOW 12/04/2019 [place] 12/04/2019 0739   LABSPEC 1.019 12/04/2019 [place] 5.0 12/04/2019 0739   GLUCOSEU NEGATIVE 12/04/2019 0739   HGBUR SMALL (A) 12/04/2019 [place] NEGATIVE 12/04/2019 0739   KETONESUR 20 (A) 12/04/2019 0739   PROTEINUR NEGATIVE 12/04/2019 0739  UROBILINOGEN 0.2 11/28/2009 1120   NITRITE NEGATIVE 12/04/2019 0739   LEUKOCYTESUR NEGATIVE 12/04/2019 0739    Lab Results  Component Value Date   BACTERIA NONE SEEN 12/04/2019    Pertinent Imaging:  No results found for this or any previous visit.  No results found for this or any previous visit.  No results found for this or any previous visit.  No results found for this or any previous visit.  No results found for this or any previous visit.  No results found for this or any previous visit.  No results found for this or any previous visit.  Results for orders placed during the hospital encounter of 12/03/19  CT Renal Stone Study  Narrative CLINICAL DATA:  86 year old male with left flank pain. Concern for kidney stone.  EXAM: CT ABDOMEN AND PELVIS WITHOUT CONTRAST  TECHNIQUE: Multidetector CT imaging of the abdomen and pelvis was performed following the standard protocol without IV contrast.  COMPARISON:  None.  FINDINGS: Evaluation of this exam is limited in the absence of intravenous contrast.  Lower chest: Left lung base subpleural density  likely represents a small pleural effusion and associated left lung base atelectasis or infiltrate. Clinical correlation is recommended. Three vessel coronary vascular calcification.  No intra-abdominal free air or free fluid.  Hepatobiliary: The liver is unremarkable. No intrahepatic biliary ductal dilatation. Cholecystectomy. No retained calcified stone noted in the central CBD.  Pancreas: Unremarkable. No pancreatic ductal dilatation or surrounding inflammatory changes.  Spleen: Normal in size without focal abnormality.  Adrenals/Urinary Tract: The adrenal glands are unremarkable. There is a 12 mm right renal upper pole nonobstructing stone. No hydronephrosis. Right renal interpolar hypodense lesion measures up to 4 cm. This is suboptimally characterized but demonstrates fluid attenuation, likely a cyst. There is no hydronephrosis or nephrolithiasis on the left. The visualized ureters and urinary bladder appear unremarkable.  Stomach/Bowel: There is moderate stool throughout the colon. Several scattered sigmoid diverticula without active inflammatory changes. There is no bowel obstruction or active inflammation. The appendix is normal.  Vascular/Lymphatic: Advanced aortoiliac atherosclerotic disease. The IVC is unremarkable. No portal venous gas. There is no adenopathy.  Reproductive: The prostate and seminal vesicles are grossly unremarkable. No pelvic masses  Other: None  Musculoskeletal: Osteopenia with scoliosis and degenerative changes of the spine. There is compression fracture of the T12 vertebra with near complete loss of vertebral body height, age indeterminate but likely acute or subacute. There is approximately 9 mm retropulsion of the posterior vertebral body. There is also compression fracture of the superior endplate of L1 with approximately 50% loss of vertebral body height. There is mild perivertebral hematoma at T12 with extension along the left pleural  space. There is moderate to severe narrowing of the central canal at T12 due to retropulsion. Correlation with neurological exam recommended. If there is neurological deficit or concern for cord compression, further evaluation with MRI is advised. There is nondisplaced fracture of the left L1 transverse process.  IMPRESSION: 1. Acute appearing compression fractures of the T12 with near complete loss of vertebral body height and 9 mm retropulsion of the posterior vertebral body. There is moderate to severe narrowing of the central canal at T12 due to retropulsion. Correlation with neurological exam recommended. If there is neurological deficit or concern for cord compression, further evaluation with MRI is advised. 2. Acute appearing compression fracture of superior endplate of L1 with approximately 50% loss of vertebral body height. 3. Small left pleural effusion or hemorrhagic fluid related to  vertebral fracture. 4. A 12 mm nonobstructing right renal upper pole stone. No hydronephrosis. 5. Moderate colonic stool burden. No bowel obstruction. Normal appendix. 6. Aortic Atherosclerosis (ICD10-I70.0).   Electronically Signed By: Anner Crete M.D. On: 12/03/2019 21:16   Assessment & Plan:    1. Benign prostatic hyperplasia with urinary obstruction Continue flomax 0.4mg  daily -RTC 1 year - Urinalysis, Routine w reflex microscopic - BLADDER SCAN AMB NON-IMAGING  2. Urinary retention -Continue flomax 0.4mg     No follow-ups on file.  Nicolette Bang, MD  [facility] [place]

## 2020-04-23 NOTE — Patient Instructions (Signed)

## 2020-05-12 DIAGNOSIS — L570 Actinic keratosis: Secondary | ICD-10-CM | POA: Diagnosis not present

## 2020-05-12 DIAGNOSIS — Z85820 Personal history of malignant melanoma of skin: Secondary | ICD-10-CM | POA: Diagnosis not present

## 2020-05-12 DIAGNOSIS — L298 Other pruritus: Secondary | ICD-10-CM | POA: Diagnosis not present

## 2020-05-12 DIAGNOSIS — Z08 Encounter for follow-up examination after completed treatment for malignant neoplasm: Secondary | ICD-10-CM | POA: Diagnosis not present

## 2020-05-12 DIAGNOSIS — D225 Melanocytic nevi of trunk: Secondary | ICD-10-CM | POA: Diagnosis not present

## 2020-05-12 DIAGNOSIS — C44622 Squamous cell carcinoma of skin of right upper limb, including shoulder: Secondary | ICD-10-CM | POA: Diagnosis not present

## 2020-05-12 DIAGNOSIS — Z1283 Encounter for screening for malignant neoplasm of skin: Secondary | ICD-10-CM | POA: Diagnosis not present

## 2020-05-15 ENCOUNTER — Ambulatory Visit: Payer: Medicare Other | Admitting: Student

## 2020-05-15 ENCOUNTER — Encounter: Payer: Self-pay | Admitting: Student

## 2020-05-15 ENCOUNTER — Other Ambulatory Visit: Payer: Self-pay

## 2020-05-15 VITALS — BP 118/78 | HR 66 | Ht 66.0 in | Wt 164.4 lb

## 2020-05-15 DIAGNOSIS — I25118 Atherosclerotic heart disease of native coronary artery with other forms of angina pectoris: Secondary | ICD-10-CM | POA: Diagnosis not present

## 2020-05-15 DIAGNOSIS — N1831 Chronic kidney disease, stage 3a: Secondary | ICD-10-CM | POA: Diagnosis not present

## 2020-05-15 DIAGNOSIS — I5042 Chronic combined systolic (congestive) and diastolic (congestive) heart failure: Secondary | ICD-10-CM | POA: Diagnosis not present

## 2020-05-15 DIAGNOSIS — I10 Essential (primary) hypertension: Secondary | ICD-10-CM | POA: Diagnosis not present

## 2020-05-15 MED ORDER — FUROSEMIDE 40 MG PO TABS
40.0000 mg | ORAL_TABLET | Freq: Every day | ORAL | 11 refills | Status: AC
Start: 2020-05-15 — End: 2020-08-13

## 2020-05-15 NOTE — Progress Notes (Signed)
Cardiology Office Note    Date:  05/15/2020   ID:  John [name], DOB May 03, 1930, MRN [id number]  PCP:  John Frizzle, MD  Cardiologist: John Lesches, MD    Chief Complaint  Patient presents with  . Follow-up    2 month visit    History of Present Illness:    John [name] is a 86 y.o. male with past medical history of CAD (s/p CABG in 1992, STEMI in 11/2015 with DES to LIMA-LAD insertion site), chronic combined systolic and diastolic CHF/ICM (EF 42-68% in 11/2015, at 25-30% by repeat echo in 06/2019), HTN, HLD, OSA and chronic thrombocytopenia who presents to the office today for 83-month follow-up.  He was last examined by myself in 02/2020 and was residing at [facility] due to suffering thoracic compression fractures over the summer. He had initially been wheelchair-bound but was ambulating with a cane. He denied any recent chest pain or dyspnea on exertion. John [name] had been discontinued during his admission secondary to hypotension and given that his BP was elevated at the time of his visit, Lisinopril was discontinued with plans to start Entresto 24-26 mg twice daily following a washout period. He was continued on Toprol-XL 25 mg twice daily and Spironolactone 12.5 mg daily.  Follow-up labs showed stable kidney function and electrolytes.  In talking with the patient and his caregiver from [facility] today, he reports still having dyspnea on exertion but feels like this might have worsened over the past few weeks. His granddaughter has also commented on this when visiting with him per their report. He denies any associated chest pain or palpitations. No recent orthopnea, PND or lower extremity edema. His weight has overall been stable.  Past Medical History:  Diagnosis Date  . Acute ST elevation myocardial infarction (STEMI) of posterior wall ([place]) 11/11/2015  . CKD (chronic kidney disease), stage III (John [name])   . Coronary atherosclerosis of native coronary artery    a.  Multivessel status post CABG in 1992. b. STEMI 11/2015 s/p DES to insertion site of LIMA-LAD.  John [name] Dilated aortic root (John [name])   . Essential hypertension   . Ischemic cardiomyopathy   . LVEF <40% 11/28/2019  . Malignant melanoma of skin   . Mixed hyperlipidemia   . Multiple falls   . Nephrolithiasis   . OSA (obstructive sleep apnea)   . Pneumonia due to COVID-19 virus    March 2021  . Rosacea   . Thrombocytopenia ([place])     Past Surgical History:  Procedure Laterality Date  . CARDIAC CATHETERIZATION  07/11/90  . CARDIAC CATHETERIZATION N/A 11/11/2015   Procedure: Left Heart Cath and Coronary Angiography;  Surgeon: [name], MD;  Location: [facility];  Service: Cardiovascular;  Laterality: N/A;  . CARDIAC CATHETERIZATION N/A 11/14/2015   Procedure: Coronary Stent Intervention w/Impella;  Surgeon: [name], MD;  Location: [facility];  Service: Cardiovascular;  Laterality: N/A;  . carotid duplex  06/24/2011, 05/12/2009   Right and Left ICAs: Demonstrates a small amount of irregular mixed density plaque with no evidence of diameter reduction, significant tortuosity or any other vascular abnormality. This is a mildly abnormal carotid duplex doppler evaluation.  John [name] CATARACT EXTRACTION W/PHACO Right 02/11/2014   Procedure: CATARACT EXTRACTION PHACO AND INTRAOCULAR LENS PLACEMENT (IOC);  Surgeon: John Branch, MD;  Location: [facility];  Service: Ophthalmology;  Laterality: Right;  CDE 13.83  . CHOLECYSTECTOMY    . CORONARY ARTERY BYPASS GRAFT  1992  . DOPPLER ECHOCARDIOGRAPHY  05/23/2007  Left ventricular systolic function is normal. The transmitral spectral doppler flow pattern is suggestive of impaired LV relaxation. No significant valvular abnormalities.  . heart monitor  04/01/2009   palpitations  . HERNIA REPAIR    . MELANOMA EXCISION     neck  . NM MYOCAR PERF EJECTION FRACTION  02/10/2011   The post stress myocaardial perfusion images show a normal pattern of perfusion in  all regions. No significant wall motion abnormalities noted. EKG is negative for ischemia. This is a low risk scan.  John [name] TOTAL KNEE ARTHROPLASTY      Current Medications: Outpatient Medications Prior to Visit  Medication Sig Dispense Refill  . acetaminophen (TYLENOL) 500 MG tablet Take 2 tablets (1,000 mg total) by mouth every 8 (eight) hours as needed for moderate pain. 30 tablet 0  . aspirin EC 81 MG tablet Take 81 mg by mouth daily.    . betamethasone dipropionate 0.05 % cream Apply topically.    . fludrocortisone (FLORINEF) 0.1 MG tablet Take 100 mcg by mouth daily.    John [name] ibuprofen (ADVIL) 200 MG tablet Take 400 mg by mouth every 8 (eight) hours as needed.    . metoprolol succinate (TOPROL-XL) 25 MG 24 hr tablet Take 1 tablet by mouth twice daily 180 tablet 1  . neomycin-polymyxin b-dexamethasone (MAXITROL) 3.5-10000-0.1 OINT Place 1 application into the left eye daily.     . nitroGLYCERIN (NITROSTAT) 0.4 MG SL tablet Place 1 tablet (0.4 mg total) under the tongue every 5 (five) minutes x 3 doses as needed for chest pain. 25 tablet 2  . pantoprazole (PROTONIX) 40 MG tablet Take 40 mg by mouth daily.    . potassium chloride SA (KLOR-CON) 20 MEQ tablet Take 2 tablets (40 mEq total) by mouth daily. 60 tablet 3  . sacubitril-valsartan (ENTRESTO) 24-26 MG Take 1 tablet by mouth 2 (two) times daily. 60 tablet 11  . tamsulosin (FLOMAX) 0.4 MG CAPS capsule Take 1 capsule (0.4 mg total) by mouth daily. 90 capsule 3  . furosemide (LASIX) 20 MG tablet Take 1 tablet (20 mg total) by mouth daily. 90 tablet 3  . spironolactone (ALDACTONE) 25 MG tablet Take 0.5 tablets (12.5 mg total) by mouth daily. 90 tablet 3   No facility-administered medications prior to visit.     Allergies:   Statins, Xanax [alprazolam], and Tape   Social History   Socioeconomic History  . Marital status: Widowed    Spouse name: Not on file  . Number of children: Not on file  . Years of education: Not on file  . Highest  education level: Not on file  Occupational History  . Not on file  Tobacco Use  . Smoking status: Former Smoker    Types: Cigarettes    Start date: 07/06/1943    Quit date: 07/05/1981    Years since quitting: 38.8  . Smokeless tobacco: Never Used  Vaping Use  . Vaping Use: Never used  Substance and Sexual Activity  . Alcohol use: No    Alcohol/week: 0.0 standard drinks  . Drug use: No  . Sexual activity: Not Currently    Partners: Female  Other Topics Concern  . Not on file  Social History Narrative  . Not on file   Social Determinants of Health   Financial Resource Strain:   . Difficulty of Paying Living Expenses: Not on file  Food Insecurity:   . Worried About Charity fundraiser in the Last Year: Not on file  . Ran Out  of Food in the Last Year: Not on file  Transportation Needs:   . Lack of Transportation (Medical): Not on file  . Lack of Transportation (Non-Medical): Not on file  Physical Activity:   . Days of Exercise per Week: Not on file  . Minutes of Exercise per Session: Not on file  Stress:   . Feeling of Stress : Not on file  Social Connections:   . Frequency of Communication with Friends and Family: Not on file  . Frequency of Social Gatherings with Friends and Family: Not on file  . Attends Religious Services: Not on file  . Active Member of Clubs or Organizations: Not on file  . Attends Archivist Meetings: Not on file  . Marital Status: Not on file     Family History:  The patient's family history includes Heart attack in his brother and father; Skin cancer in his brother.   Review of Systems:   Please see the history of present illness.     General:  No chills, fever, night sweats or weight changes.  Cardiovascular:  No chest pain, edema, orthopnea, palpitations, paroxysmal nocturnal dyspnea. Positive for dyspnea on exertion.  Dermatological: No rash, lesions/masses. Positive for easy bruising.  Respiratory: No cough, dyspnea Urologic: No  hematuria, dysuria Abdominal:   No nausea, vomiting, diarrhea, bright red blood per rectum, melena, or hematemesis Neurologic:  No visual changes, wkns, changes in mental status.  All other systems reviewed and are otherwise negative except as noted above.   Physical Exam:    VS:  BP 118/78   Pulse 66   Ht 5\' 6"  (1.676 m)   Wt 164 lb 6.4 oz (74.6 kg)   SpO2 97%   BMI 26.53 kg/m    General: Well developed, elderly male appearing in no acute distress. Head: Normocephalic, atraumatic. Neck: No carotid bruits. JVD not elevated.  Lungs: Respirations regular and unlabored, without wheezes or rales.  Heart: Regular rate and rhythm. No S3 or S4.  2/6 holosystolic murmur along Apex.  Abdomen: Appears non-distended. No obvious abdominal masses. Msk:  Strength and tone appear normal for age. No obvious joint deformities or effusions. Extremities: No clubbing or cyanosis. Trace ankle edema bilaterally.  Distal pedal pulses are 2+ bilaterally. Neuro: Alert and oriented X 3. Moves all extremities spontaneously. No focal deficits noted. Psych:  Responds to questions appropriately with a normal affect. Skin: No rashes or lesions noted  Wt Readings from Last 3 Encounters:  05/15/20 164 lb 6.4 oz (74.6 kg)  04/23/20 156 lb (70.8 kg)  02/28/20 163 lb 9.6 oz (74.2 kg)     Studies/Labs Reviewed:   EKG:  EKG is not ordered today.   Recent Labs: 12/03/2019: B Natriuretic Peptide 712.0; TSH 1.701 12/04/2019: ALT 32 12/06/2019: Magnesium 1.9 12/09/2019: Hemoglobin 13.8; Platelets 87 04/10/2020: BUN 23; Creatinine, Ser 1.30; Potassium 4.6; Sodium 137   Lipid Panel    Component Value Date/Time   CHOL 170 10/29/2019 1113   TRIG 111 10/29/2019 1113   HDL 44 10/29/2019 1113   CHOLHDL 3.9 10/29/2019 1113   VLDL 41 (H) 11/12/2015 0432   LDLCALC 105 (H) 10/29/2019 1113    Additional studies/ records that were reviewed today include:   Echocardiogram: 06/2019 IMPRESSIONS    1. Left ventricular  ejection fraction, by visual estimation, is 25 to  30%. The left ventricle has moderate to severely decreased function. There  is no left ventricular hypertrophy.  2. Severely dilated left ventricular internal cavity size.  3.  The left ventricle demonstrates global hypokinesis.  4. Global right ventricle has normal systolic function.The right  ventricular size is normal. No increase in right ventricular wall  thickness.  5. Left atrial size was mildly dilated.  6. Right atrial size was normal.  7. The mitral valve is normal in structure. Mild mitral valve  regurgitation.  8. The tricuspid valve is normal in structure. Tricuspid valve  regurgitation is mild.  9. The aortic valve is tricuspid. Aortic valve regurgitation is mild.  10. Pulmonic regurgitation is mild.  11. The pulmonic valve was not well visualized. Pulmonic valve  regurgitation is mild.  12. The inferior vena cava is normal in size with greater than 50%  respiratory variability, suggesting right atrial pressure of 3 mmHg.   Assessment:    1. Chronic combined systolic and diastolic heart failure ([name])   2. Coronary artery disease of native artery of native heart with stable angina pectoris ([place])   3. Essential hypertension   4. Stage 3a chronic kidney disease ([place])      Plan:   In order of problems listed above:  1. Chronic Combined Systolic and Diastolic CHF - His EF was at 40-45% in 11/2015, further reduced at 25-30% by repeat echo in 06/2019. He does report having dyspnea on exertion over the past several months but feels like this might have worsened over the past few weeks. He denies any associated orthopnea, PND or lower extremity edema. He appears euvolemic by examination today. I have recommended that we try increasing his Lasix from 20 mg daily to 40 mg daily for a week to see if that helps with symptoms. If it does, would plan to obtain a follow-up BMET for reassessment of kidney function electrolytes.  If no change in symptoms, would resume his prior dose of 20 mg daily. Will obtain a repeat echocardiogram for reassessment of his EF and wall motion given that it has been almost a year since his last study. Continue Toprol-XL, Entresto and Spironolactone at current dosing.  2. CAD - He is s/p CABG in 1992 and had a STEMI in 11/2015 with DES to LIMA-LAD insertion site. He does report having dyspnea on exertion but denies any recent chest pain. Continue current medical therapy with ASA 81 mg daily, Toprol-XL 25 mg twice daily and PRN SL NTG. He was previously intolerant to multiple statins and has declined alternative medication options by review of notes.  3. HTN - BP is well controlled at 118/78 during today's visit. Continue current medication regimen with Toprol-XL 25 mg twice daily, Entresto 24-26 mg twice daily and Spironolactone 12.5 mg daily.  4. Stage 3 CKD - Creatinine was stable at 1.30 by recent labs in 04/2020 which is close to his baseline.  Medication Adjustments/Labs and Tests Ordered: Current medicines are reviewed at length with the patient today.  Concerns regarding medicines are outlined above.  Medication changes, Labs and Tests ordered today are listed in the Patient Instructions below. Patient Instructions  Medication Instructions:  Your physician recommends that you continue on your current medications as directed. Please refer to the Current Medication list given to you today.  Increase Lasix to 40 mg Daily for one week then call and update office on symptoms.  *If you need a refill on your cardiac medications before your next appointment, please call your pharmacy*   Lab Work: NONE   If you have labs (blood work) drawn today and your tests are completely normal, you will receive your results only  by: . MyChart Message (if you have MyChart) OR . A paper copy in the mail If you have any lab test that is abnormal or we need to change your treatment, we will call you  to review the results.   Testing/Procedures: Your physician has requested that you have an echocardiogram. Echocardiography is a painless test that uses sound waves to create images of your heart. It provides your doctor with information about the size and shape of your heart and how well your heart's chambers and valves are working. This procedure takes approximately one hour. There are no restrictions for this procedure.     Follow-Up: At [facility], you and your health needs are our priority.  As part of our continuing mission to provide you with exceptional heart care, we have created designated Provider Care Teams.  These Care Teams include your primary Cardiologist (physician) and Advanced Practice Providers (APPs -  Physician Assistants and Nurse Practitioners) who all work together to provide you with the care you need, when you need it.  We recommend signing up for the patient portal called "MyChart".  Sign up information is provided on this After Visit Summary.  MyChart is used to connect with patients for Virtual Visits (Telemedicine).  Patients are able to view lab/test results, encounter notes, upcoming appointments, etc.  Non-urgent messages can be sent to your provider as well.   To learn more about what you can do with MyChart, go to [email or web address].    Your next appointment:   3 month(s)  The format for your next appointment:   In Person  Provider:   [name], MD or Bernerd Pho, PA-C   Other Instructions Thank you for choosing [facility]!     Signed, Erma Heritage, PA-C  05/15/2020 5:41 PM    [place] [address] [place], [place] [address] Phone: [phone] Fax: [phone]

## 2020-05-15 NOTE — Patient Instructions (Signed)
Medication Instructions:  Your physician recommends that you continue on your current medications as directed. Please refer to the Current Medication list given to you today.  Increase Lasix to 40 mg Daily for one week then call and update office on symptoms.  *If you need a refill on your cardiac medications before your next appointment, please call your pharmacy*   Lab Work: NONE   If you have labs (blood work) drawn today and your tests are completely normal, you will receive your results only by: [name] MyChart Message (if you have MyChart) OR . A paper copy in the mail If you have any lab test that is abnormal or we need to change your treatment, we will call you to review the results.   Testing/Procedures: Your physician has requested that you have an echocardiogram. Echocardiography is a painless test that uses sound waves to create images of your heart. It provides your doctor with information about the size and shape of your heart and how well your heart's chambers and valves are working. This procedure takes approximately one hour. There are no restrictions for this procedure.     Follow-Up: At [facility], you and your health needs are our priority.  As part of our continuing mission to provide you with exceptional heart care, we have created designated Provider Care Teams.  These Care Teams include your primary Cardiologist (physician) and Advanced Practice Providers (APPs -  Physician Assistants and Nurse Practitioners) who all work together to provide you with the care you need, when you need it.  We recommend signing up for the patient portal called "MyChart".  Sign up information is provided on this After Visit Summary.  MyChart is used to connect with patients for Virtual Visits (Telemedicine).  Patients are able to view lab/test results, encounter notes, upcoming appointments, etc.  Non-urgent messages can be sent to your provider as well.   To learn more about what you can  do with MyChart, go to [email or web address].    Your next appointment:   3 month(s)  The format for your next appointment:   In Person  Provider:   [name], MD or Bernerd Pho, PA-C   Other Instructions Thank you for choosing [facility]!

## 2020-05-23 ENCOUNTER — Telehealth: Payer: Self-pay | Admitting: Student

## 2020-05-23 NOTE — Telephone Encounter (Signed)
New message    [facility] called with an update on patient condition , they believe the [facility] is helping with the patients condition

## 2020-05-23 NOTE — Telephone Encounter (Signed)
I spoke with Sharyn Lull form [facility], she has already left work so she did not have his weight. She does report he feels well.

## 2020-05-30 ENCOUNTER — Ambulatory Visit
Admission: EM | Admit: 2020-05-30 | Discharge: 2020-05-30 | Disposition: A | Discharge status: Home / Self Care | Payer: Medicare Other | Attending: Family Medicine | Admitting: Family Medicine

## 2020-05-30 ENCOUNTER — Ambulatory Visit (INDEPENDENT_AMBULATORY_CARE_PROVIDER_SITE_OTHER): Payer: Medicare Other

## 2020-05-30 ENCOUNTER — Encounter: Payer: Self-pay | Admitting: Emergency Medicine

## 2020-05-30 ENCOUNTER — Other Ambulatory Visit: Payer: Self-pay

## 2020-05-30 DIAGNOSIS — R059 Cough, unspecified: Secondary | ICD-10-CM

## 2020-05-30 DIAGNOSIS — B349 Viral infection, unspecified: Secondary | ICD-10-CM

## 2020-05-30 DIAGNOSIS — R0989 Other specified symptoms and signs involving the circulatory and respiratory systems: Secondary | ICD-10-CM | POA: Diagnosis not present

## 2020-05-30 DIAGNOSIS — R0981 Nasal congestion: Secondary | ICD-10-CM

## 2020-05-30 MED ORDER — GUAIFENESIN 400 MG PO TABS: 400.0000 mg | ORAL_TABLET | Freq: Four times a day (QID) | ORAL | 0 refills | Status: AC | PRN

## 2020-05-30 MED ORDER — GUAIFENESIN 400 MG PO TABS: 400.0000 mg | ORAL_TABLET | Freq: Four times a day (QID) | ORAL | 0 refills | Status: DC | PRN

## 2020-05-30 NOTE — ED Triage Notes (Signed)
Cough and nasal drainage for past 2-3 days.  Pt from assisted living and tested covid neg since onset of symptoms.

## 2020-05-30 NOTE — ED Provider Notes (Signed)
[place]    CSN: [id number] Arrival date & time: 05/30/20  1136      History   Chief Complaint Chief Complaint  Patient presents with  . Cough    HPI John [name] is a 86 y.o. male.   Reports cough and nasal drainage for the last 2-3 days. Daughter reports that she gave him Mucinex yesterday and that this helped. Is a resident of [facility] and had negative Covid test there yesterday. Denies headache, nausea, vomiting, diarrhea, rash, fever, SOB, other symptoms.  ROS per HPI  The history is provided by the patient and a relative.    Past Medical History:  Diagnosis Date  . Acute ST elevation myocardial infarction (STEMI) of posterior wall ([place]) 11/11/2015  . CKD (chronic kidney disease), stage III ([place])   . Coronary atherosclerosis of native coronary artery    a. Multivessel status post CABG in 1992. b. STEMI 11/2015 s/p DES to insertion site of LIMA-LAD.  [name] Dilated aortic root ([place])   . Essential hypertension   . Ischemic cardiomyopathy   . LVEF <40% 11/28/2019  . Malignant melanoma of skin   . Mixed hyperlipidemia   . Multiple falls   . Nephrolithiasis   . OSA (obstructive sleep apnea)   . Pneumonia due to COVID-19 virus    March 2021  . Rosacea   . Thrombocytopenia [facility])     Patient Active Problem List   Diagnosis Date Noted  . Hypokalemia 12/06/2019  . Acute encephalopathy 12/05/2019  . CKD (chronic kidney disease), stage III ([place]) 12/04/2019  . Compression fracture of T12 vertebra ([place]) 12/03/2019  . LVEF <40% 11/28/2019  . Chronic back pain 11/27/2019  . History of recent fall 10/29/2019  . 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 Dilated aortic root ([place]) 10/29/2019  . Thrombocytopenia ([place]) 10/29/2019  . Benign prostatic hyperplasia with urinary obstruction 10/24/2019  . Urinary retention 09/19/2019  . Pneumonia due to COVID-19 virus 09/11/2019  . Sepsis ([place]) 09/11/2019  . Acute ST elevation myocardial  infarction (STEMI) of posterior wall ([place]) 11/11/2015  . CAD in native artery 09/17/2013  . Essential hypertension, benign 09/17/2013  . Mixed hyperlipidemia 09/17/2013    Past Surgical History:  Procedure Laterality Date  . CARDIAC CATHETERIZATION  07/11/90  . CARDIAC CATHETERIZATION N/A 11/11/2015   Procedure: Left Heart Cath and Coronary Angiography;  Surgeon: [name], MD;  Location: [place] [facility];  Service: Cardiovascular;  Laterality: N/A;  . CARDIAC CATHETERIZATION N/A 11/14/2015   Procedure: Coronary Stent Intervention w/Impella;  Surgeon: [name], MD;  Location: [place] [facility];  Service: Cardiovascular;  Laterality: N/A;  . carotid duplex  06/24/2011, 05/12/2009   Right and Left ICAs: Demonstrates a small amount of irregular mixed density plaque with no evidence of diameter reduction, significant tortuosity or any other vascular abnormality. This is a mildly abnormal carotid duplex doppler evaluation.  [name] CATARACT EXTRACTION W/PHACO Right 02/11/2014   Procedure: CATARACT EXTRACTION PHACO AND INTRAOCULAR LENS PLACEMENT (IOC);  Surgeon: [name], MD;  Location: [facility];  Service: Ophthalmology;  Laterality: Right;  CDE 13.83  . CHOLECYSTECTOMY    . CORONARY ARTERY BYPASS GRAFT  1992  . DOPPLER ECHOCARDIOGRAPHY  05/23/2007   Left ventricular systolic function is normal. The transmitral spectral doppler flow pattern is suggestive of impaired LV relaxation. No significant valvular abnormalities.  . heart monitor  04/01/2009   palpitations  . HERNIA REPAIR    . MELANOMA EXCISION  neck  . NM MYOCAR PERF EJECTION FRACTION  02/10/2011   The post stress myocaardial perfusion images show a normal pattern of perfusion in all regions. No significant wall motion abnormalities noted. EKG is negative for ischemia. This is a low risk scan.  [name] TOTAL KNEE ARTHROPLASTY         Home Medications    Prior to Admission medications   Medication Sig Start Date End Date Taking?  Authorizing Provider  acetaminophen (TYLENOL) 500 MG tablet Take 2 tablets (1,000 mg total) by mouth every 8 (eight) hours as needed for moderate pain. 12/13/19   Oswald Hillock, MD  aspirin EC 81 MG tablet Take 81 mg by mouth daily.    [provider]  betamethasone dipropionate 0.05 % cream Apply topically. 05/12/20   [provider]  fludrocortisone (FLORINEF) 0.1 MG tablet Take 100 mcg by mouth daily. 05/22/19   [provider]  furosemide (LASIX) 40 MG tablet Take 1 tablet (40 mg total) by mouth daily. Take for one week then update office with symptoms 05/15/20 08/13/20  Ahmed Prima, Fransisco Hertz, PA-C  guaifenesin (HUMIBID E) 400 MG TABS tablet Take 1 tablet (400 mg total) by mouth every 6 (six) hours as needed. 05/30/20   Faustino Congress, NP  ibuprofen (ADVIL) 200 MG tablet Take 400 mg by mouth every 8 (eight) hours as needed.    [provider]  metoprolol succinate (TOPROL-XL) 25 MG 24 hr tablet Take 1 tablet by mouth twice daily 10/25/19   [name], MD  neomycin-polymyxin b-dexamethasone (MAXITROL) 3.5-10000-0.1 OINT Place 1 application into the left eye daily.  07/10/19   [provider]  nitroGLYCERIN (NITROSTAT) 0.4 MG SL tablet Place 1 tablet (0.4 mg total) under the tongue every 5 (five) minutes x 3 doses as needed for chest pain. 11/16/15   Erlene Quan, PA-C  pantoprazole (PROTONIX) 40 MG tablet Take 40 mg by mouth daily. 10/29/13   [provider]  potassium chloride SA (KLOR-CON) 20 MEQ tablet Take 2 tablets (40 mEq total) by mouth daily. 10/12/19   Strader, Fransisco Hertz, PA-C  sacubitril-valsartan (ENTRESTO) 24-26 MG Take 1 tablet by mouth 2 (two) times daily. 02/28/20   Strader, Fransisco Hertz, PA-C  spironolactone (ALDACTONE) 25 MG tablet Take 0.5 tablets (12.5 mg total) by mouth daily. 11/28/19 02/28/20  [name], MD  tamsulosin (FLOMAX) 0.4 MG CAPS capsule Take 1 capsule (0.4 mg total) by mouth daily. 04/23/20   McKenzie,  Candee Furbish, MD    Family History Family History  Problem Relation Age of Onset  . Heart attack Father   . Skin cancer Brother   . Heart attack Brother     Social History Social History   Tobacco Use  . Smoking status: Former Smoker    Types: Cigarettes    Start date: 07/06/1943    Quit date: 07/05/1981    Years since quitting: 38.9  . Smokeless tobacco: Never Used  Vaping Use  . Vaping Use: Never used  Substance Use Topics  . Alcohol use: No    Alcohol/week: 0.0 standard drinks  . Drug use: No     Allergies   Statins, Xanax [alprazolam], Ativan [lorazepam], Flexeril [cyclobenzaprine], Oxycodone, Shrimp (diagnostic), and Tape   Review of Systems Review of Systems   Physical Exam Triage Vital Signs ED Triage Vitals  Enc Vitals Group     BP 05/30/20 1315 124/75     Pulse Rate 05/30/20 1315 63     Resp 05/30/20  1315 19     Temp 05/30/20 1315 97.7 F (36.5 C)     Temp Source 05/30/20 1315 Oral     SpO2 05/30/20 1315 95 %     Weight --      Height --      Head Circumference --      Peak Flow --      Pain Score 05/30/20 1316 0     Pain Loc --      Pain Edu? --      Excl. in [place]? --    No data found.  Updated Vital Signs BP 124/75 (BP Location: Right Arm)   Pulse 63   Temp 97.7 F (36.5 C) (Oral)   Resp 19   SpO2 95%      Physical Exam Vitals and nursing note reviewed.  Constitutional:      General: He is not in acute distress.    Appearance: Normal appearance. He is well-developed. He is not ill-appearing.  HENT:     Head: Normocephalic and atraumatic.     Right Ear: Tympanic membrane, ear canal and external ear normal.     Left Ear: Tympanic membrane, ear canal and external ear normal.     Nose: Nose normal.     Mouth/Throat:     Mouth: Mucous membranes are moist.     Pharynx: Oropharynx is clear.  Eyes:     Extraocular Movements: Extraocular movements intact.     Conjunctiva/sclera: Conjunctivae normal.     Pupils: Pupils are equal, round, and  reactive to light.  Cardiovascular:     Rate and Rhythm: Normal rate and regular rhythm.     Heart sounds: Normal heart sounds. No murmur heard.   Pulmonary:     Effort: Pulmonary effort is normal. No respiratory distress.     Breath sounds: No stridor. No wheezing, rhonchi or rales.     Comments: Diminished in bilateral bases Chest:     Chest wall: No tenderness.  Abdominal:     General: Bowel sounds are normal.     Palpations: Abdomen is soft.     Tenderness: There is no abdominal tenderness.  Musculoskeletal:     Cervical back: Normal range of motion and neck supple.  Lymphadenopathy:     Cervical: Cervical adenopathy present.  Skin:    General: Skin is warm and dry.     Capillary Refill: Capillary refill takes less than 2 seconds.  Neurological:     General: No focal deficit present.     Mental Status: He is alert and oriented to person, place, and time.  Psychiatric:        Mood and Affect: Mood normal.        Behavior: Behavior normal.        Thought Content: Thought content normal.      UC Treatments / Results  Labs (all labs ordered are listed, but only abnormal results are displayed) Labs Reviewed - No data to display  EKG   Radiology No results found.  Procedures Procedures (including critical care time)  Medications Ordered in UC Medications - No data to display  Initial Impression / Assessment and Plan / UC Course  I have reviewed the triage vital signs and the nursing notes.  Pertinent labs & imaging results that were available during my care of the patient were reviewed by me and considered in my medical decision making (see chart for details).     Cough Nasal Congestion Viral Illness  Provided written  rx for mucinex for facility Declines respiratory viral panel testing today Chest xray negative for pneumonia today Continue supportive care at home Continue home medication regimen Follow up with PCP if symptoms persist Follow up with ER  for high fever, SOB, trouble swallowing, other concerning symptoms  Final Clinical Impressions(s) / UC Diagnoses   Final diagnoses:  Cough  Nasal congestion  Viral illness     Discharge Instructions     Printed Rx for Mucinex provided to family for facility  Chest xray negative for pneumonia today  Follow up with this office or with primary care if symptoms are persisting.  Follow up in the ER for high fever, trouble swallowing, trouble breathing, other concerning symptoms.     ED Prescriptions    Medication Sig Dispense Auth. Provider   guaifenesin (HUMIBID E) 400 MG TABS tablet  (Status: Discontinued) Take 1 tablet (400 mg total) by mouth every 6 (six) hours as needed. 84 tablet Faustino Congress, NP   guaifenesin (HUMIBID E) 400 MG TABS tablet Take 1 tablet (400 mg total) by mouth every 6 (six) hours as needed. 84 tablet Faustino Congress, NP     PDMP not reviewed this encounter.   Faustino Congress, NP 06/02/20 1259

## 2020-05-30 NOTE — Discharge Instructions (Signed)
Printed Rx for Mucinex provided to family for facility  Chest xray negative for pneumonia today  Follow up with this office or with primary care if symptoms are persisting.  Follow up in the ER for high fever, trouble swallowing, trouble breathing, other concerning symptoms.

## 2020-06-11 ENCOUNTER — Ambulatory Visit (HOSPITAL_COMMUNITY)
Admission: RE | Admit: 2020-06-11 | Discharge: 2020-06-11 | Disposition: A | Discharge status: Home / Self Care | Payer: Medicare Other | Source: Ambulatory Visit | Attending: Student | Admitting: Student

## 2020-06-11 ENCOUNTER — Other Ambulatory Visit: Payer: Self-pay

## 2020-06-11 DIAGNOSIS — I5042 Chronic combined systolic (congestive) and diastolic (congestive) heart failure: Secondary | ICD-10-CM | POA: Diagnosis not present

## 2020-06-11 LAB — ECHOCARDIOGRAM COMPLETE
AR max vel: 2.07 cm2
AV Area VTI: 2.63 cm2
AV Area mean vel: 2.05 cm2
AV Mean grad: 1.9 mmHg
AV Peak grad: 3.9 mmHg
Ao pk vel: 0.98 m/s
Area-P 1/2: 5.13 cm2
Calc EF: 39.8 %
S' Lateral: 4.9 cm
Single Plane A2C EF: 35.5 %
Single Plane A4C EF: 37.5 %

## 2020-06-11 NOTE — Progress Notes (Signed)
*  PRELIMINARY RESULTS* Echocardiogram 2D Echocardiogram has been performed.  John [name] 06/11/2020, 11:35 AM

## 2020-06-17 DIAGNOSIS — B351 Tinea unguium: Secondary | ICD-10-CM | POA: Diagnosis not present

## 2020-06-17 DIAGNOSIS — M79675 Pain in left toe(s): Secondary | ICD-10-CM | POA: Diagnosis not present

## 2020-06-17 DIAGNOSIS — M79674 Pain in right toe(s): Secondary | ICD-10-CM | POA: Diagnosis not present

## 2020-06-26 ENCOUNTER — Telehealth: Payer: Self-pay | Admitting: Student

## 2020-06-26 DIAGNOSIS — Z79899 Other long term (current) drug therapy: Secondary | ICD-10-CM

## 2020-06-26 NOTE — Telephone Encounter (Signed)
New message     Pateint daughter needs financial application for [employer] for patients [employer]

## 2020-06-26 NOTE — Telephone Encounter (Signed)
Call daughter when we have paperwork ready to pick up

## 2020-06-26 NOTE — Telephone Encounter (Signed)
Spoke with Vaughan Basta and notified that [employer] patient assistance form printed and ready for pick up.

## 2020-07-23 NOTE — Progress Notes (Signed)
Cardiology Office Note    Date:  07/24/2020   ID:  John [name], DOB Jul 16, 1929, MRN [id number]  PCP:  [name], MD  Cardiologist: [name], MD    Chief Complaint  Patient presents with  . Follow-up    2 month visit    History of Present Illness:    John [name] is a 87 y.o. male with past medical history of CAD (s/p CABG in 1992, STEMI in 11/2015 with DES to LIMA-LAD insertion site), chronic combined systolic and diastolic CHF/ICM (EF [id number] in 11/2015, at 25-30% by repeat echo in 06/2019), HTN, HLD, OSA and chronic thrombocytopenia who presents to the office today for 41-month follow-up.  He was last examined myself in 05/2020 and reported still having dyspnea on exertion but denied any associated chest pain or palpitations. Given his cardiomyopathy, Lasix was titrated from 20 mg daily to 40 mg daily for 1 week to see if this would help with his symptoms. He was continued on Toprol-XL, Entresto and Spironolactone with plans for a follow-up echocardiogram. His repeat echocardiogram was obtained in 06/2020 and showed that his EF had improved to 45 to 50%. He did have mild LVH along with mild aortic regurgitation.  In talking with the patient and his caregiver from [place] today, he reports overall doing well since his last visit. He reports his dyspnea has improved and he denies any orthopnea, PND or lower extremity edema. No recent chest pain or palpitations. He does report frequent urination with Lasix but also reports an increase in the amount of fluids he has been consuming.    Past Medical History:  Diagnosis Date  . Acute ST elevation myocardial infarction (STEMI) of posterior wall ([place]) 11/11/2015  . CKD (chronic kidney disease), stage III ([place])   . Coronary atherosclerosis of native coronary artery    a. Multivessel status post CABG in 1992. b. STEMI 11/2015 s/p DES to insertion site of LIMA-LAD.  [name] Dilated aortic root ([place])   . Essential hypertension    . Ischemic cardiomyopathy   . LVEF <40% 11/28/2019  . Malignant melanoma of skin   . Mixed hyperlipidemia   . Multiple falls   . Nephrolithiasis   . OSA (obstructive sleep apnea)   . Pneumonia due to COVID-19 virus    March 2021  . Rosacea   . Thrombocytopenia ([place])     Past Surgical History:  Procedure Laterality Date  . CARDIAC CATHETERIZATION  07/11/90  . CARDIAC CATHETERIZATION N/A 11/11/2015   Procedure: Left Heart Cath and Coronary Angiography;  Surgeon: [name], MD;  Location: [facility];  Service: Cardiovascular;  Laterality: N/A;  . CARDIAC CATHETERIZATION N/A 11/14/2015   Procedure: Coronary Stent Intervention w/Impella;  Surgeon: [name], MD;  Location: [facility];  Service: Cardiovascular;  Laterality: N/A;  . carotid duplex  06/24/2011, 05/12/2009   Right and Left ICAs: Demonstrates a small amount of irregular mixed density plaque with no evidence of diameter reduction, significant tortuosity or any other vascular abnormality. This is a mildly abnormal carotid duplex doppler evaluation.  [name] CATARACT EXTRACTION W/PHACO Right 02/11/2014   Procedure: CATARACT EXTRACTION PHACO AND INTRAOCULAR LENS PLACEMENT (IOC);  Surgeon: [name], MD;  Location: [facility];  Service: Ophthalmology;  Laterality: Right;  CDE 13.83  . CHOLECYSTECTOMY    . CORONARY ARTERY BYPASS GRAFT  1992  . DOPPLER ECHOCARDIOGRAPHY  05/23/2007   Left ventricular systolic function is normal. The transmitral spectral doppler flow pattern is suggestive  of impaired LV relaxation. No significant valvular abnormalities.  . heart monitor  04/01/2009   palpitations  . HERNIA REPAIR    . MELANOMA EXCISION     neck  . NM MYOCAR PERF EJECTION FRACTION  02/10/2011   The post stress myocaardial perfusion images show a normal pattern of perfusion in all regions. No significant wall motion abnormalities noted. EKG is negative for ischemia. This is a low risk scan.  [name] TOTAL KNEE ARTHROPLASTY       Current Medications: Outpatient Medications Prior to Visit  Medication Sig Dispense Refill  . acetaminophen (TYLENOL) 500 MG tablet Take 2 tablets (1,000 mg total) by mouth every 8 (eight) hours as needed for moderate pain. 30 tablet 0  . aspirin EC 81 MG tablet Take 81 mg by mouth daily.    . betamethasone dipropionate 0.05 % cream Apply topically.    . fludrocortisone (FLORINEF) 0.1 MG tablet Take 100 mcg by mouth daily.    . furosemide (LASIX) 40 MG tablet Take 1 tablet (40 mg total) by mouth daily. Take for one week then update office with symptoms 30 tablet 11  . guaifenesin (HUMIBID E) 400 MG TABS tablet Take 1 tablet (400 mg total) by mouth every 6 (six) hours as needed. 84 tablet 0  . ibuprofen (ADVIL) 200 MG tablet Take 400 mg by mouth every 8 (eight) hours as needed.    . metoprolol tartrate (LOPRESSOR) 25 MG tablet Take 25 mg by mouth 2 (two) times daily.    [name] neomycin-polymyxin b-dexamethasone (MAXITROL) 3.5-10000-0.1 OINT Place 1 application into the left eye daily.     . nitroGLYCERIN (NITROSTAT) 0.4 MG SL tablet Place 1 tablet (0.4 mg total) under the tongue every 5 (five) minutes x 3 doses as needed for chest pain. 25 tablet 2  . pantoprazole (PROTONIX) 40 MG tablet Take 40 mg by mouth daily.    . potassium chloride SA (KLOR-CON) 20 MEQ tablet Take 2 tablets (40 mEq total) by mouth daily. 60 tablet 3  . sacubitril-valsartan (ENTRESTO) 24-26 MG Take 1 tablet by mouth 2 (two) times daily. 60 tablet 11  . tamsulosin (FLOMAX) 0.4 MG CAPS capsule Take 1 capsule (0.4 mg total) by mouth daily. 90 capsule 3  . spironolactone (ALDACTONE) 25 MG tablet Take 0.5 tablets (12.5 mg total) by mouth daily. 90 tablet 3  . metoprolol succinate (TOPROL-XL) 25 MG 24 hr tablet Take 1 tablet by mouth twice daily 180 tablet 1   No facility-administered medications prior to visit.     Allergies:   Statins, Xanax [alprazolam], Ativan [lorazepam], Flexeril [cyclobenzaprine], Oxycodone, Shrimp  (diagnostic), and Tape   Social History   Socioeconomic History  . Marital status: Widowed    Spouse name: Not on file  . Number of children: Not on file  . Years of education: Not on file  . Highest education level: Not on file  Occupational History  . Not on file  Tobacco Use  . Smoking status: Former Smoker    Types: Cigarettes    Start date: 07/06/1943    Quit date: 07/05/1981    Years since quitting: 39.0  . Smokeless tobacco: Never Used  Vaping Use  . Vaping Use: Never used  Substance and Sexual Activity  . Alcohol use: No    Alcohol/week: 0.0 standard drinks  . Drug use: No  . Sexual activity: Not Currently    Partners: Female  Other Topics Concern  . Not on file  Social History Narrative  . Not  on file   Social Determinants of Health   Financial Resource Strain: Not on file  Food Insecurity: Not on file  Transportation Needs: Not on file  Physical Activity: Not on file  Stress: Not on file  Social Connections: Not on file     Family History:  The patient's family history includes Heart attack in his brother and father; Skin cancer in his brother.   Review of Systems:   Please see the history of present illness.     General:  No chills, fever, night sweats or weight changes.  Cardiovascular:  No chest pain, edema, orthopnea, palpitations, paroxysmal nocturnal dyspnea. Positive for dyspnea on exertion (improved).  Dermatological: No rash, lesions/masses Respiratory: No cough, dyspnea Urologic: No hematuria, dysuria Abdominal:   No nausea, vomiting, diarrhea, bright red blood per rectum, melena, or hematemesis Neurologic:  No visual changes, wkns, changes in mental status. All other systems reviewed and are otherwise negative except as noted above.   Physical Exam:    VS:  BP (!) 122/56   Pulse 76   Ht 5\' 6"  (1.676 m)   Wt 170 lb (77.1 kg)   SpO2 99%   BMI 27.44 kg/m    General: Pleasant elderly male appearing in no acute distress. Hard of hearing.   Head: Normocephalic, atraumatic. Neck: No carotid bruits. JVD not elevated.  Lungs: Respirations regular and unlabored, without wheezes or rales.  Heart: Regular rate and rhythm. No S3 or S4.  No murmur, no rubs, or gallops appreciated. Abdomen: Appears non-distended. No obvious abdominal masses. Msk:  Strength and tone appear normal for age. No obvious joint deformities or effusions. Extremities: No clubbing or cyanosis. No lower extremity edema.  Distal pedal pulses are 2+ bilaterally. Neuro: Alert and oriented X 3. Moves all extremities spontaneously. No focal deficits noted. Psych:  Responds to questions appropriately with a normal affect. Skin: No rashes or lesions noted  Wt Readings from Last 3 Encounters:  07/24/20 170 lb (77.1 kg)  05/15/20 164 lb 6.4 oz (74.6 kg)  04/23/20 156 lb (70.8 kg)     Studies/Labs Reviewed:   EKG:  EKG is not ordered today.    Recent Labs: 12/03/2019: B Natriuretic Peptide 712.0; TSH 1.701 12/04/2019: ALT 32 12/06/2019: Magnesium 1.9 12/09/2019: Hemoglobin 13.8; Platelets 87 07/24/2020: BUN 28; Creatinine, Ser 1.57; Potassium 4.5; Sodium 140   Lipid Panel    Component Value Date/Time   CHOL 170 10/29/2019 1113   TRIG 111 10/29/2019 1113   HDL 44 10/29/2019 1113   CHOLHDL 3.9 10/29/2019 1113   VLDL 41 (H) 11/12/2015 0432   LDLCALC 105 (H) 10/29/2019 1113    Additional studies/ records that were reviewed today include:   Echocardiogram: 06/2020 IMPRESSIONS    1. Left ventricular ejection fraction, by estimation, is 45 to 50%. The  left ventricle has mildly decreased function. The left ventricle has no  regional wall motion abnormalities. There is mild left ventricular  hypertrophy. Left ventricular diastolic  parameters are indeterminate.  2. Right ventricular systolic function is normal. The right ventricular  size is normal. There is normal pulmonary artery systolic pressure.  3. Left atrial size was moderately dilated.  4. The  mitral valve is normal in structure. No evidence of mitral valve  regurgitation. No evidence of mitral stenosis.  5. The aortic valve is tricuspid. Aortic valve regurgitation is mild. No  aortic stenosis is present.   Assessment:    1. Chronic combined systolic and diastolic heart failure ([place])   2.  Coronary artery disease involving native coronary artery of native heart without angina pectoris   3. Medication management   4. Essential hypertension   5. Stage 3a chronic kidney disease ([place])      Plan:   In order of problems listed above:  1. Chronic Combined Systolic and Diastolic CHF - His EF was previously reduced at 25 to 30% by echocardiogram in 06/2019, significantly improved to 45 to 50% by repeat imaging last month. - He reports his dyspnea on exertion has improved and denies any orthopnea, PND or lower extremity edema. - Will continue his current medication regimen for now with Entresto 24-26 mg twice daily and Lopressor 25 mg twice daily (changed from Toprol-XL by facility). He was previously on Spironolactone but it appears this was discontinued by his facility in the interim (possibly due to soft BP). He is currently on Lasix 40 mg daily and will recheck a BMET today.  2. CAD - He is s/p CABG in 1992 and had a STEMI in 11/2015 with DES to LIMA-LAD insertion site. His dyspnea has improved and he denies any recent chest pain. - Continue current medication regimen with ASA 81 mg daily and Lopressor 25 mg twice daily. He was previously intolerant to multiple statins and has not been interested in alternative medications given his age.    3. HTN - BP is well controlled at 122/56 during today's visit. Continue current medication regimen.  4. Stage 3 CKD - Baseline creatinine 1.1 - 1.2. Will recheck BMET today as it does not appear this was obtained following prior titration of Lasix.    Medication Adjustments/Labs and Tests Ordered: Current medicines are reviewed at length  with the patient today.  Concerns regarding medicines are outlined above.  Medication changes, Labs and Tests ordered today are listed in the Patient Instructions below. Patient Instructions  Medication Instructions:  Your physician recommends that you continue on your current medications as directed. Please refer to the Current Medication list given to you today.  *If you need a refill on your cardiac medications before your next appointment, please call your pharmacy*   Lab Work: Your physician recommends that you return for lab work in: Today   If you have labs (blood work) drawn today and your tests are completely normal, you will receive your results only by: [name] MyChart Message (if you have MyChart) OR . A paper copy in the mail If you have any lab test that is abnormal or we need to change your treatment, we will call you to review the results.   Testing/Procedures: NONE    Follow-Up: At [facility], you and your health needs are our priority.  As part of our continuing mission to provide you with exceptional heart care, we have created designated Provider Care Teams.  These Care Teams include your primary Cardiologist (physician) and Advanced Practice Providers (APPs -  Physician Assistants and Nurse Practitioners) who all work together to provide you with the care you need, when you need it.  We recommend signing up for the patient portal called "MyChart".  Sign up information is provided on this After Visit Summary.  MyChart is used to connect with patients for Virtual Visits (Telemedicine).  Patients are able to view lab/test results, encounter notes, upcoming appointments, etc.  Non-urgent messages can be sent to your provider as well.   To learn more about what you can do with MyChart, go to [email or web address].    Your next appointment:   3-4 month(s)  The  format for your next appointment:   In Person  Provider:   [name], MD or Bernerd Pho,  PA-C   Other Instructions Thank you for choosing [facility]!       Signed, Erma Heritage, PA-C  07/24/2020 4:56 PM    [facility] [address] [place], [place] [address] Phone: [phone] Fax: [phone]

## 2020-07-24 ENCOUNTER — Ambulatory Visit: Payer: Medicare Other | Admitting: Student

## 2020-07-24 ENCOUNTER — Encounter: Payer: Self-pay | Admitting: Student

## 2020-07-24 ENCOUNTER — Other Ambulatory Visit (HOSPITAL_COMMUNITY)
Admission: RE | Admit: 2020-07-24 | Discharge: 2020-07-24 | Disposition: A | Discharge status: Home / Self Care | Payer: Medicare Other | Source: Ambulatory Visit | Attending: Student | Admitting: Student

## 2020-07-24 ENCOUNTER — Other Ambulatory Visit: Payer: Self-pay

## 2020-07-24 VITALS — BP 122/56 | HR 76 | Ht 66.0 in | Wt 170.0 lb

## 2020-07-24 DIAGNOSIS — N1831 Chronic kidney disease, stage 3a: Secondary | ICD-10-CM | POA: Diagnosis not present

## 2020-07-24 DIAGNOSIS — I2510 Atherosclerotic heart disease of native coronary artery without angina pectoris: Secondary | ICD-10-CM

## 2020-07-24 DIAGNOSIS — Z79899 Other long term (current) drug therapy: Secondary | ICD-10-CM

## 2020-07-24 DIAGNOSIS — I5042 Chronic combined systolic (congestive) and diastolic (congestive) heart failure: Secondary | ICD-10-CM

## 2020-07-24 DIAGNOSIS — I10 Essential (primary) hypertension: Secondary | ICD-10-CM

## 2020-07-24 LAB — BASIC METABOLIC PANEL
Anion gap: 8 (ref 5–15)
BUN: 28 mg/dL — ABNORMAL HIGH (ref 8–23)
CO2: 25 mmol/L (ref 22–32)
Calcium: 9.3 mg/dL (ref 8.9–10.3)
Chloride: 107 mmol/L (ref 98–111)
Creatinine, Ser: 1.57 mg/dL — ABNORMAL HIGH (ref 0.61–1.24)
GFR, Estimated: 42 mL/min — ABNORMAL LOW (ref >60)
Glucose, Bld: 130 mg/dL — ABNORMAL HIGH (ref 70–99)
Potassium: 4.5 mmol/L (ref 3.5–5.1)
Sodium: 140 mmol/L (ref 135–145)

## 2020-07-24 NOTE — Telephone Encounter (Signed)
[facility] facility and gave them the results of the patients tests. The nurse, Lovey Newcomer, verbalized medication change instructions. Orders for BMET were put in.

## 2020-07-24 NOTE — Telephone Encounter (Signed)
-----   Message from Erma Heritage, [place] sent at 07/24/2020  4:28 PM EST ----- Please let Nanine Means know that the patient's kidney function has slightly worsened since his last office visit. Given that the pumping function of his heart has improved and volume status was stable by examination today, I would recommend they reduce his Lasix to 20 mg daily. He can take an extra tablet if needed for weight gain or worsening edema. Repeat BMET in 1 month.

## 2020-07-24 NOTE — Patient Instructions (Signed)
Medication Instructions:  Your physician recommends that you continue on your current medications as directed. Please refer to the Current Medication list given to you today.  *If you need a refill on your cardiac medications before your next appointment, please call your pharmacy*   Lab Work: Your physician recommends that you return for lab work in: Today   If you have labs (blood work) drawn today and your tests are completely normal, you will receive your results only by: [name] MyChart Message (if you have MyChart) OR . A paper copy in the mail If you have any lab test that is abnormal or we need to change your treatment, we will call you to review the results.   Testing/Procedures: NONE    Follow-Up: At [facility], you and your health needs are our priority.  As part of our continuing mission to provide you with exceptional heart care, we have created designated Provider Care Teams.  These Care Teams include your primary Cardiologist (physician) and Advanced Practice Providers (APPs -  Physician Assistants and Nurse Practitioners) who all work together to provide you with the care you need, when you need it.  We recommend signing up for the patient portal called "MyChart".  Sign up information is provided on this After Visit Summary.  MyChart is used to connect with patients for Virtual Visits (Telemedicine).  Patients are able to view lab/test results, encounter notes, upcoming appointments, etc.  Non-urgent messages can be sent to your provider as well.   To learn more about what you can do with MyChart, go to [email or web address].    Your next appointment:   3-4 month(s)  The format for your next appointment:   In Person  Provider:   [name], MD or Bernerd Pho, PA-C   Other Instructions Thank you for choosing [facility]!

## 2020-08-06 ENCOUNTER — Ambulatory Visit: Payer: Medicare Other | Admitting: Nurse Practitioner

## 2020-08-14 ENCOUNTER — Telehealth: Payer: Self-pay | Admitting: *Deleted

## 2020-08-14 NOTE — Telephone Encounter (Signed)
Pt has been approved for the [employer] Patient [place] for the remainder of the year.

## 2020-08-18 DIAGNOSIS — Z85820 Personal history of malignant melanoma of skin: Secondary | ICD-10-CM | POA: Diagnosis not present

## 2020-08-18 DIAGNOSIS — L570 Actinic keratosis: Secondary | ICD-10-CM | POA: Diagnosis not present

## 2020-08-18 DIAGNOSIS — Z1283 Encounter for screening for malignant neoplasm of skin: Secondary | ICD-10-CM | POA: Diagnosis not present

## 2020-08-18 DIAGNOSIS — D225 Melanocytic nevi of trunk: Secondary | ICD-10-CM | POA: Diagnosis not present

## 2020-08-18 DIAGNOSIS — Z08 Encounter for follow-up examination after completed treatment for malignant neoplasm: Secondary | ICD-10-CM | POA: Diagnosis not present

## 2020-08-18 DIAGNOSIS — Z85828 Personal history of other malignant neoplasm of skin: Secondary | ICD-10-CM | POA: Diagnosis not present

## 2020-08-18 DIAGNOSIS — X32XXXD Exposure to sunlight, subsequent encounter: Secondary | ICD-10-CM | POA: Diagnosis not present

## 2020-08-21 ENCOUNTER — Other Ambulatory Visit (HOSPITAL_COMMUNITY)
Admission: RE | Admit: 2020-08-21 | Discharge: 2020-08-21 | Disposition: A | Discharge status: Home / Self Care | Payer: Medicare Other | Source: Ambulatory Visit | Attending: Student | Admitting: Student

## 2020-08-21 ENCOUNTER — Other Ambulatory Visit: Payer: Self-pay

## 2020-08-21 DIAGNOSIS — Z79899 Other long term (current) drug therapy: Secondary | ICD-10-CM | POA: Diagnosis not present

## 2020-08-21 LAB — BASIC METABOLIC PANEL
Anion gap: 8 (ref 5–15)
BUN: 24 mg/dL — ABNORMAL HIGH (ref 8–23)
CO2: 22 mmol/L (ref 22–32)
Calcium: 9.2 mg/dL (ref 8.9–10.3)
Chloride: 107 mmol/L (ref 98–111)
Creatinine, Ser: 1.35 mg/dL — ABNORMAL HIGH (ref 0.61–1.24)
GFR, Estimated: 50 mL/min — ABNORMAL LOW (ref >60)
Glucose, Bld: 111 mg/dL — ABNORMAL HIGH (ref 70–99)
Potassium: 4.5 mmol/L (ref 3.5–5.1)
Sodium: 137 mmol/L (ref 135–145)

## 2020-08-22 ENCOUNTER — Telehealth: Payer: Self-pay

## 2020-08-22 NOTE — Telephone Encounter (Signed)
-----   Message from Erma Heritage, [place] sent at 08/21/2020  2:09 PM EST ----- Please let the patient know (resides at [facility]) that his electrolytes remain within normal limits and his kidney function has improved following dose adjustment of Lasix. Continue current medication regimen.

## 2020-08-22 NOTE — Telephone Encounter (Signed)
Patient and daughter contacted and verbalized understanding. Patient nor daughter had questions or concerns at this time.

## 2020-08-28 ENCOUNTER — Other Ambulatory Visit: Payer: Self-pay

## 2020-09-01 ENCOUNTER — Other Ambulatory Visit: Payer: Self-pay

## 2020-09-01 NOTE — Telephone Encounter (Signed)
Received fax request for refill entresto to pharmacy in [place].Pt is resident at [facility] and they use [facility] in [place].

## 2020-09-02 ENCOUNTER — Telehealth: Payer: Self-pay | Admitting: Family Medicine

## 2020-09-02 NOTE — Telephone Encounter (Signed)
Employee from [facility] in [place] called asking if this pt could get a speech therapy ordered for pt.  Fax # for [employer]: [phone]

## 2020-09-02 NOTE — Telephone Encounter (Signed)
ok 

## 2020-09-02 NOTE — Telephone Encounter (Signed)
Ok to place order 

## 2020-09-04 ENCOUNTER — Other Ambulatory Visit: Payer: Self-pay

## 2020-09-04 DIAGNOSIS — Z7689 Persons encountering health services in other specified circumstances: Secondary | ICD-10-CM

## 2020-09-04 NOTE — Telephone Encounter (Signed)
Referral placed for speech therapy

## 2020-09-04 NOTE — Telephone Encounter (Signed)
Please place order and I will be glad to fax.

## 2020-09-09 ENCOUNTER — Telehealth: Payer: Self-pay | Admitting: Family Medicine

## 2020-09-09 NOTE — Telephone Encounter (Signed)
John [name] from [facility] Physical therapy department called asking if patient needed a modified barium swallow test. She states patient is scheduled for 09/10/2020 to see them. She states that she was just making sure we didn't forget to place an order.  Please advise.    CB# [phone]

## 2020-09-10 ENCOUNTER — Ambulatory Visit (HOSPITAL_COMMUNITY): Payer: Medicare Other | Admitting: Speech Pathology

## 2020-09-10 NOTE — Telephone Encounter (Signed)
Copied from a Secure chat between Dr. Dennard Schaumann and John [name], [place] and myself:  John [name], CCC-SLP "Hi there, this is John [name], the SLP at [facility] I see that this Pt is on my schedule for tomorrow, but don't know what the referral is for. Is the Pt a resident at [facility]? If so, I assume they have SLP services there? Our receptionist called the daughter and she didn't know why he was referred. I just want to make sure we see him for the right thing. Does he have dysphagia and need a swallow eval? Was someone at [facility] the one driving the referral? Thank you"  Dr. Dennard Schaumann- " John [name] is driving the referral. They requested the referral (see phone note 09/02/20)"   [name] , " Ok I left a message at [facility] and hopefully the nurse will call me back. If it is for swallowing problems, we would need an order for MBSS and plan accordingly to schedule that"  Dr. Dennard Schaumann has left the conversation  [name] - " I spoke to the staff at [facility], apparently the granddaughter says he chokes on coffee when they take him out to eat once a month and wanted a swallowing evaluation. He will need an order for MBSS. We can complete that here, but just need an order from Dr. Dennard Schaumann. Larene Beach, can you place the order in Epic for that? I can see if we can create the order and [place] can just co sign"  Shannon " I don't have security to place orders if you can create the order for Dr. Dennard Schaumann to co sign that should be great he will sign I am sure"  [name] " Ok, we cannot create the order either- just spoke to my supervisor. The referral needs to come from [facility]- he can edit the current order or create a new one, but it has to say- Modified barium swallow study due to dysphagia"  Please advise-

## 2020-09-10 NOTE — Telephone Encounter (Signed)
Please advise 

## 2020-09-11 ENCOUNTER — Other Ambulatory Visit (HOSPITAL_COMMUNITY): Payer: Self-pay | Admitting: Specialist

## 2020-09-11 ENCOUNTER — Other Ambulatory Visit: Payer: Self-pay | Admitting: Family Medicine

## 2020-09-11 DIAGNOSIS — R1310 Dysphagia, unspecified: Secondary | ICD-10-CM

## 2020-09-11 DIAGNOSIS — R1319 Other dysphagia: Secondary | ICD-10-CM

## 2020-09-15 ENCOUNTER — Encounter (HOSPITAL_COMMUNITY): Payer: Self-pay | Admitting: Speech Pathology

## 2020-09-15 ENCOUNTER — Ambulatory Visit (HOSPITAL_COMMUNITY): Payer: Medicare Other | Attending: Family Medicine | Admitting: Speech Pathology

## 2020-09-15 ENCOUNTER — Other Ambulatory Visit: Payer: Self-pay

## 2020-09-15 ENCOUNTER — Ambulatory Visit (HOSPITAL_COMMUNITY)
Admission: RE | Admit: 2020-09-15 | Discharge: 2020-09-15 | Disposition: A | Discharge status: Home / Self Care | Payer: Medicare Other | Source: Ambulatory Visit | Attending: Family Medicine | Admitting: Family Medicine

## 2020-09-15 DIAGNOSIS — R1310 Dysphagia, unspecified: Secondary | ICD-10-CM | POA: Diagnosis not present

## 2020-09-15 DIAGNOSIS — R1319 Other dysphagia: Secondary | ICD-10-CM | POA: Insufficient documentation

## 2020-09-15 DIAGNOSIS — T17320A Food in larynx causing asphyxiation, initial encounter: Secondary | ICD-10-CM | POA: Diagnosis not present

## 2020-09-15 DIAGNOSIS — R6339 Other feeding difficulties: Secondary | ICD-10-CM | POA: Diagnosis not present

## 2020-09-15 DIAGNOSIS — R1312 Dysphagia, oropharyngeal phase: Secondary | ICD-10-CM | POA: Diagnosis not present

## 2020-09-15 NOTE — Therapy (Signed)
John [name], [place], [address] Phone: [phone]   Fax:  [phone]  Modified Barium Swallow  Patient Details  Name: John [name] MRN: [id number] Date of Birth: Apr 17, 1930 No data recorded  Encounter Date: 09/15/2020   End of Session - 09/15/20 1300    Visit Number 1    Number of Visits 1    Authorization Type [employer] Medicare    SLP Start Time 1133    SLP Stop Time  1200    SLP Time Calculation (min) 27 min    Activity Tolerance Patient tolerated treatment well           Past Medical History:  Diagnosis Date  . Acute ST elevation myocardial infarction (STEMI) of posterior wall ([place]) 11/11/2015  . CKD (chronic kidney disease), stage III ([place])   . Coronary atherosclerosis of native coronary artery    a. Multivessel status post CABG in 1992. b. STEMI 11/2015 s/p DES to insertion site of LIMA-LAD.  [name] Dilated aortic root ([place])   . Essential hypertension   . Ischemic cardiomyopathy   . LVEF <40% 11/28/2019  . Malignant melanoma of skin   . Mixed hyperlipidemia   . Multiple falls   . Nephrolithiasis   . OSA (obstructive sleep apnea)   . Pneumonia due to COVID-19 virus    March 2021  . Rosacea   . Thrombocytopenia ([place])     Past Surgical History:  Procedure Laterality Date  . CARDIAC CATHETERIZATION  07/11/90  . CARDIAC CATHETERIZATION N/A 11/11/2015   Procedure: Left Heart Cath and Coronary Angiography;  Surgeon: [name], MD;  Location: [facility];  Service: Cardiovascular;  Laterality: N/A;  . CARDIAC CATHETERIZATION N/A 11/14/2015   Procedure: Coronary Stent Intervention w/Impella;  Surgeon: [name], MD;  Location: [facility];  Service: Cardiovascular;  Laterality: N/A;  . carotid duplex  06/24/2011, 05/12/2009   Right and Left ICAs: Demonstrates a small amount of irregular mixed density plaque with no evidence of diameter reduction, significant tortuosity or any other vascular  abnormality. This is a mildly abnormal carotid duplex doppler evaluation.  [name] CATARACT EXTRACTION W/PHACO Right 02/11/2014   Procedure: CATARACT EXTRACTION PHACO AND INTRAOCULAR LENS PLACEMENT (IOC);  Surgeon: [name], MD;  Location: [facility];  Service: Ophthalmology;  Laterality: Right;  CDE 13.83  . CHOLECYSTECTOMY    . CORONARY ARTERY BYPASS GRAFT  1992  . DOPPLER ECHOCARDIOGRAPHY  05/23/2007   Left ventricular systolic function is normal. The transmitral spectral doppler flow pattern is suggestive of impaired LV relaxation. No significant valvular abnormalities.  . heart monitor  04/01/2009   palpitations  . HERNIA REPAIR    . MELANOMA EXCISION     neck  . NM MYOCAR PERF EJECTION FRACTION  02/10/2011   The post stress myocaardial perfusion images show a normal pattern of perfusion in all regions. No significant wall motion abnormalities noted. EKG is negative for ischemia. This is a low risk scan.  [name] TOTAL KNEE ARTHROPLASTY      There were no vitals filed for this visit.   Subjective Assessment - 09/15/20 1250    Subjective "Sometimes I get choked on my own saliva."    Special Tests MBSS    Currently in Pain? No/denies               General - 09/15/20 1252      General Information   Date of Onset 09/09/20    HPI John  [name] is an 87 yo male resident of [place] who was referred for MBSS by his PCP, Dr. Jenna Luo due to family reporting that Pt coughs while drinking his coffee at times. The Pt also tells me that he gets "strangled" on his saliva and that his voice has been different for several years ("not plain").    Type of Study MBS-Modified Barium Swallow Study    Diet Prior to this Study Regular;Thin liquids    Temperature Spikes Noted No    Respiratory Status Room air    History of Recent Intubation No    Behavior/Cognition Alert;Cooperative;Pleasant mood    Oral Cavity Assessment Within Functional Limits    Oral Care Completed by SLP No    Oral Cavity -  Dentition Adequate natural dentition    Vision Functional for self feeding    Self-Feeding Abilities Able to feed self    Patient Positioning Upright in chair    Baseline Vocal Quality Normal    Volitional Cough Strong    Volitional Swallow Able to elicit    Anatomy Within functional limits    Pharyngeal Secretions Not observed secondary MBS              Oral Preparation/Oral Phase - 09/15/20 1255      Oral Preparation/Oral Phase   Oral Phase Within functional limits      Electrical stimulation - Oral Phase   Was Electrical Stimulation Used No            Pharyngeal Phase - 09/15/20 1255      Pharyngeal Phase   Pharyngeal Phase Impaired      Pharyngeal - Thin   Pharyngeal- Thin Teaspoon Within functional limits    Pharyngeal- Thin Cup Swallow initiation at vallecula;Swallow initiation at pyriform sinus;Penetration/Aspiration during swallow    Pharyngeal Material does not enter airway;Material enters airway, remains ABOVE vocal cords then ejected out;Material enters airway, remains ABOVE vocal cords and not ejected out    Pharyngeal- Thin Straw Swallow initiation at pyriform sinus;Reduced airway/laryngeal closure;Penetration/Aspiration during swallow;Trace aspiration;Pharyngeal residue - valleculae;Pharyngeal residue - pyriform    Pharyngeal Material enters airway, passes BELOW cords and not ejected out despite cough attempt by patient      Pharyngeal - Solids   Pharyngeal- Puree Within functional limits    Pharyngeal- Regular Within functional limits    Pharyngeal- Pill Penetration/Aspiration during swallow   flash penetration of thins when taking the pill   Pharyngeal Material enters airway, remains ABOVE vocal cords then ejected out      Electrical Stimulation - Pharyngeal Phase   Was Electrical Stimulation Used No            Cricopharyngeal Phase - 09/15/20 1259      Cervical Esophageal Phase   Cervical Esophageal Phase Within functional limits              Plan - 09/15/20 1301    Clinical Impression Statement Pt presents with mild oropharyngeal dysphagia characterized by premature spillage/delay in swallow initiation with thin liquids with swallow trigger after filling the valleculae and spilling to the pyriforms resulting in flash penetration (underepiglottic coating) with cup sips of thin and aspiration during the swallow with sequential straw sips thin barium which eventually elicited a cough, but was not fully removed by Pt. Esophageal sweep was unremarkable. No significant pharyngeal residuals noted, spontaneous repeat/dry swallows cleared the pharynx. Recommend regular textures with thin liquids via single cup sips, no straws, and Pt to swallow 2x for each sip of  liquid, standard aspiration and reflux precautions. These instructions were written down and also discussed with Pt and caregiver who acknowledge understanding. No further SLP services indicated at this time.    Consulted and Agree with Plan of Care Patient           Patient will benefit from skilled therapeutic intervention in order to improve the following deficits and impairments:   Dysphagia, oropharyngeal phase     Recommendations/Treatment - 09/15/20 1259      Swallow Evaluation Recommendations   SLP Diet Recommendations Age appropriate regular;Thin    Liquid Administration via Cup;No straw    Medication Administration Whole meds with liquid    Supervision Patient able to self feed    Compensations Multiple dry swallows after each bite/sip;Small sips/bites    Postural Changes Seated upright at 90 degrees;Remain upright for at least 30 minutes after feeds/meals            Prognosis - 09/15/20 1259      Prognosis   Prognosis for Safe Diet Advancement Good      Individuals Consulted   Consulted and Agree with Results and Recommendations Patient    Report Sent to  Referring physician           Problem List Patient Active Problem List   Diagnosis Date  Noted  . Hypokalemia 12/06/2019  . Acute encephalopathy 12/05/2019  . CKD (chronic kidney disease), stage III ([place]) 12/04/2019  . Compression fracture of T12 vertebra ([place]) 12/03/2019  . LVEF <40% 11/28/2019  . Chronic back pain 11/27/2019  . History of recent fall 10/29/2019  . Malignant neoplasm of mandible ([place]) 10/29/2019  . Dilated aortic root ([place]) 10/29/2019  . Thrombocytopenia ([place]) 10/29/2019  . Benign prostatic hyperplasia with urinary obstruction 10/24/2019  . Urinary retention 09/19/2019  . Pneumonia due to COVID-19 virus 09/11/2019  . Sepsis ([place]) 09/11/2019  . Acute ST elevation myocardial infarction (STEMI) of posterior wall ([place]) 11/11/2015  . CAD in native artery 09/17/2013  . Essential hypertension, benign 09/17/2013  . Mixed hyperlipidemia 09/17/2013   Thank you,  Genene Churn, [place]  [facility] 09/15/2020, 1:08 PM  [facility] [address] [place], [place], [address] Phone: [phone]   Fax:  [phone]  Name: John [name] MRN: [id number] Date of Birth: [birth year]

## 2020-10-29 ENCOUNTER — Encounter: Payer: Self-pay | Admitting: Cardiology

## 2020-10-29 ENCOUNTER — Ambulatory Visit: Payer: Medicare Other | Admitting: Cardiology

## 2020-10-29 ENCOUNTER — Other Ambulatory Visit: Payer: Self-pay

## 2020-10-29 VITALS — BP 122/72 | HR 66 | Ht 66.0 in | Wt 168.8 lb

## 2020-10-29 DIAGNOSIS — I10 Essential (primary) hypertension: Secondary | ICD-10-CM

## 2020-10-29 DIAGNOSIS — I25119 Atherosclerotic heart disease of native coronary artery with unspecified angina pectoris: Secondary | ICD-10-CM

## 2020-10-29 DIAGNOSIS — I5042 Chronic combined systolic (congestive) and diastolic (congestive) heart failure: Secondary | ICD-10-CM | POA: Diagnosis not present

## 2020-10-29 DIAGNOSIS — N1832 Chronic kidney disease, stage 3b: Secondary | ICD-10-CM | POA: Diagnosis not present

## 2020-10-29 NOTE — Patient Instructions (Signed)

## 2020-10-29 NOTE — Progress Notes (Signed)
Cardiology Office Note  Date: 10/29/2020   ID: John [name], DOB 09-28-1929, MRN [id number]  PCP:  [name], MD  Cardiologist:  [name], MD Electrophysiologist:  None   Chief Complaint  Patient presents with  . Cardiac follow-up    History of Present Illness: John [name] is an 87 y.o. male last seen in January by John [name].  He is here today with an Environmental consultant from [place].  Reports no angina symptoms or recent nitroglycerin use.  He has a more chronic "greasy" feeling in his mouth, also dry mouth with swallowing.  He has been able to take his medications and states that he otherwise eats well.  He is using a walker, no recent falls.  I reviewed his medications, cardiac regimen is noted below.  Echocardiogram from December 2021 revealed LVEF 45 to 50% range.  I reviewed his interval lab work as well.  I personally reviewed his ECG today which shows sinus rhythm with prolonged PR interval and LVH, old inferior infarct pattern.  Past Medical History:  Diagnosis Date  . Acute ST elevation myocardial infarction (STEMI) of posterior wall (John [name]) 11/11/2015  . CKD (chronic kidney disease), stage III (John [name])   . Coronary atherosclerosis of native coronary artery    a. Multivessel status post CABG in 1992. b. STEMI 11/2015 s/p DES to insertion site of LIMA-LAD.  John [name] Dilated aortic root (John [name])   . Essential hypertension   . Ischemic cardiomyopathy   . LVEF <40% 11/28/2019  . Malignant melanoma of skin   . Mixed hyperlipidemia   . Multiple falls   . Nephrolithiasis   . OSA (obstructive sleep apnea)   . Pneumonia due to COVID-19 virus    March 2021  . Rosacea   . Thrombocytopenia (John [name])     Past Surgical History:  Procedure Laterality Date  . CARDIAC CATHETERIZATION  07/11/90  . CARDIAC CATHETERIZATION N/A 11/11/2015   Procedure: Left Heart Cath and Coronary Angiography;  Surgeon: [name], MD;  Location: [facility];  Service: Cardiovascular;   Laterality: N/A;  . CARDIAC CATHETERIZATION N/A 11/14/2015   Procedure: Coronary Stent Intervention w/Impella;  Surgeon: [name], MD;  Location: [facility];  Service: Cardiovascular;  Laterality: N/A;  . carotid duplex  06/24/2011, 05/12/2009   Right and Left ICAs: Demonstrates a small amount of irregular mixed density plaque with no evidence of diameter reduction, significant tortuosity or any other vascular abnormality. This is a mildly abnormal carotid duplex doppler evaluation.  John [name] CATARACT EXTRACTION W/PHACO Right 02/11/2014   Procedure: CATARACT EXTRACTION PHACO AND INTRAOCULAR LENS PLACEMENT (IOC);  Surgeon: [name], MD;  Location: [facility];  Service: Ophthalmology;  Laterality: Right;  CDE 13.83  . CHOLECYSTECTOMY    . CORONARY ARTERY BYPASS GRAFT  1992  . DOPPLER ECHOCARDIOGRAPHY  05/23/2007   Left ventricular systolic function is normal. The transmitral spectral doppler flow pattern is suggestive of impaired LV relaxation. No significant valvular abnormalities.  . heart monitor  04/01/2009   palpitations  . HERNIA REPAIR    . MELANOMA EXCISION     neck  . NM MYOCAR PERF EJECTION FRACTION  02/10/2011   The post stress myocaardial perfusion images show a normal pattern of perfusion in all regions. No significant wall motion abnormalities noted. EKG is negative for ischemia. This is a low risk scan.  John [name] TOTAL KNEE ARTHROPLASTY      Current Outpatient Medications  Medication Sig Dispense Refill  . acetaminophen (TYLENOL) 500  MG tablet Take 2 tablets (1,000 mg total) by mouth every 8 (eight) hours as needed for moderate pain. 30 tablet 0  . aspirin EC 81 MG tablet Take 81 mg by mouth daily.    . fludrocortisone (FLORINEF) 0.1 MG tablet Take 100 mcg by mouth daily.    . furosemide (LASIX) 40 MG tablet Take 1 tablet (40 mg total) by mouth daily. Take for one week then update office with symptoms 30 tablet 11  . guaifenesin (HUMIBID E) 400 MG TABS tablet Take 1 tablet (400 mg  total) by mouth every 6 (six) hours as needed. 84 tablet 0  . ibuprofen (ADVIL) 200 MG tablet Take 400 mg by mouth every 8 (eight) hours as needed.    . metoprolol tartrate (LOPRESSOR) 25 MG tablet Take 25 mg by mouth 2 (two) times daily.    John [name] neomycin-polymyxin b-dexamethasone (MAXITROL) 3.5-10000-0.1 OINT Place 1 application into the left eye daily.     . nitroGLYCERIN (NITROSTAT) 0.4 MG SL tablet Place 1 tablet (0.4 mg total) under the tongue every 5 (five) minutes x 3 doses as needed for chest pain. 25 tablet 2  . pantoprazole (PROTONIX) 40 MG tablet Take 40 mg by mouth daily.    . potassium chloride SA (KLOR-CON) 20 MEQ tablet Take 2 tablets (40 mEq total) by mouth daily. 60 tablet 3  . sacubitril-valsartan (ENTRESTO) 24-26 MG Take 1 tablet by mouth 2 (two) times daily. 60 tablet 11  . spironolactone (ALDACTONE) 25 MG tablet Take 12.5 mg by mouth daily.    . tamsulosin (FLOMAX) 0.4 MG CAPS capsule Take 1 capsule (0.4 mg total) by mouth daily. 90 capsule 3   No current facility-administered medications for this visit.   Allergies:  Statins, Xanax [alprazolam], Ativan [lorazepam], Flexeril [cyclobenzaprine], Oxycodone, Shrimp (diagnostic), and Tape   ROS: Chronic hearing loss.  Physical Exam: VS:  BP 122/72   Pulse 66   Ht 5\' 6"  (1.676 m)   Wt 168 lb 12.8 oz (76.6 kg)   SpO2 97%   BMI 27.25 kg/m , BMI Body mass index is 27.25 kg/m.  Wt Readings from Last 3 Encounters:  10/29/20 168 lb 12.8 oz (76.6 kg)  07/24/20 170 lb (77.1 kg)  05/15/20 164 lb 6.4 oz (74.6 kg)    General: Elderly male, using a walker.  Appears comfortable at rest. HEENT: Conjunctiva and lids normal, wearing a mask. Neck: Supple, no elevated JVP or carotid bruits, no thyromegaly. Lungs: Clear to auscultation, nonlabored breathing at rest. Cardiac: Regular rate and rhythm, no S3, 2/6 systolic murmur, no pericardial rub. Extremities: Mild ankle edema, distal pulses 2+.  ECG:  An ECG dated 12/03/2019 was  personally reviewed today and demonstrated:  Sinus rhythm with prolonged PR interval and IVCD.  Recent Labwork: 12/03/2019: B Natriuretic Peptide 712.0; TSH 1.701 12/04/2019: ALT 32; AST 41 12/06/2019: Magnesium 1.9 12/09/2019: Hemoglobin 13.8; Platelets 87 08/21/2020: BUN 24; Creatinine, Ser 1.35; Potassium 4.5; Sodium 137     Component Value Date/Time   CHOL 170 10/29/2019 1113   TRIG 111 10/29/2019 1113   HDL 44 10/29/2019 1113   CHOLHDL 3.9 10/29/2019 1113   VLDL 41 (H) 11/12/2015 0432   LDLCALC 105 (H) 10/29/2019 1113    Other Studies Reviewed Today:  Echocardiogram 06/11/2020: 1. Left ventricular ejection fraction, by estimation, is 45 to 50%. The  left ventricle has mildly decreased function. The left ventricle has no  regional wall motion abnormalities. There is mild left ventricular  hypertrophy. Left ventricular diastolic  parameters are indeterminate.  2. Right ventricular systolic function is normal. The right ventricular  size is normal. There is normal pulmonary artery systolic pressure.  3. Left atrial size was moderately dilated.  4. The mitral valve is normal in structure. No evidence of mitral valve  regurgitation. No evidence of mitral stenosis.  5. The aortic valve is tricuspid. Aortic valve regurgitation is mild. No  aortic stenosis is present.   Assessment and Plan:  1.  Ischemic cardiomyopathy with chronic combined heart failure, clinically stable at this time.  LVEF was 45 to 50% as of December 2021.  Continue metoprolol, Entresto, Aldactone, and Lasix with potassium supplement.  2.  Multivessel CAD status post CABG and subsequent DES to the LIMA to LAD insertion site in 2017.  No active angina at this time.  He is on aspirin and Lopressor.  Has multi statin intolerance and not interested in different lipid-lowering medication.  3.  Essential hypertension, blood pressure well controlled today.  No changes made to current regimen.  4.  CKD stage IIIb,  creatinine 1.35.  Medication Adjustments/Labs and Tests Ordered: Current medicines are reviewed at length with the patient today.  Concerns regarding medicines are outlined above.   Tests Ordered: No orders of the defined types were placed in this encounter.   Medication Changes: No orders of the defined types were placed in this encounter.   Disposition:  Follow up 6 months.  Signed, [name], MD, [facility] 10/29/2020 2:13 PM    [facility] at [facility] [address] [place], [place] [address] Phone: [phone]; Fax: [phone]

## 2020-10-29 NOTE — Addendum Note (Signed)
Addended by: Berlinda Last on: 10/29/2020 02:39 PM   Modules accepted: Orders

## 2020-11-17 ENCOUNTER — Telehealth: Payer: Self-pay | Admitting: Cardiology

## 2020-11-17 MED ORDER — ENTRESTO 24-26 MG PO TABS: 1.0000 | ORAL_TABLET | Freq: Two times a day (BID) | ORAL | 3 refills | Status: AC

## 2020-11-17 NOTE — Telephone Encounter (Signed)
Pt's daughter John [name] called stating that the assistance program is needing a new Rx sent in for the pt's sacubitril-valsartan (ENTRESTO) 24-26 MG [[id number]]

## 2020-11-17 NOTE — Telephone Encounter (Signed)
Daughter informed that entresto rx sent to [facility].

## 2020-11-25 DIAGNOSIS — M79675 Pain in left toe(s): Secondary | ICD-10-CM | POA: Diagnosis not present

## 2020-11-25 DIAGNOSIS — M79674 Pain in right toe(s): Secondary | ICD-10-CM | POA: Diagnosis not present

## 2020-11-25 DIAGNOSIS — B351 Tinea unguium: Secondary | ICD-10-CM | POA: Diagnosis not present

## 2020-12-15 DIAGNOSIS — H04123 Dry eye syndrome of bilateral lacrimal glands: Secondary | ICD-10-CM | POA: Diagnosis not present

## 2021-01-01 DIAGNOSIS — C44319 Basal cell carcinoma of skin of other parts of face: Secondary | ICD-10-CM | POA: Diagnosis not present

## 2021-01-01 DIAGNOSIS — Z1283 Encounter for screening for malignant neoplasm of skin: Secondary | ICD-10-CM | POA: Diagnosis not present

## 2021-01-01 DIAGNOSIS — Z85820 Personal history of malignant melanoma of skin: Secondary | ICD-10-CM | POA: Diagnosis not present

## 2021-01-01 DIAGNOSIS — Z08 Encounter for follow-up examination after completed treatment for malignant neoplasm: Secondary | ICD-10-CM | POA: Diagnosis not present

## 2021-01-01 DIAGNOSIS — X32XXXD Exposure to sunlight, subsequent encounter: Secondary | ICD-10-CM | POA: Diagnosis not present

## 2021-01-01 DIAGNOSIS — L570 Actinic keratosis: Secondary | ICD-10-CM | POA: Diagnosis not present

## 2021-02-02 DEATH — deceased

## 2021-02-17 DIAGNOSIS — M79674 Pain in right toe(s): Secondary | ICD-10-CM | POA: Diagnosis not present

## 2021-02-17 DIAGNOSIS — M79675 Pain in left toe(s): Secondary | ICD-10-CM | POA: Diagnosis not present

## 2021-02-17 DIAGNOSIS — B351 Tinea unguium: Secondary | ICD-10-CM | POA: Diagnosis not present

## 2021-02-20 IMAGING — US US ABDOMEN LIMITED
1 series · 14 of 25 positions shown · non-contrast
Comparison: None.

CLINICAL DATA: Hyperbilirubinemia.

EXAM:
ULTRASOUND ABDOMEN LIMITED RIGHT UPPER QUADRANT

[Series 1: us abdomen limited · 0.19mm/px · 14 of 72 slices shown]
[im 1/72]
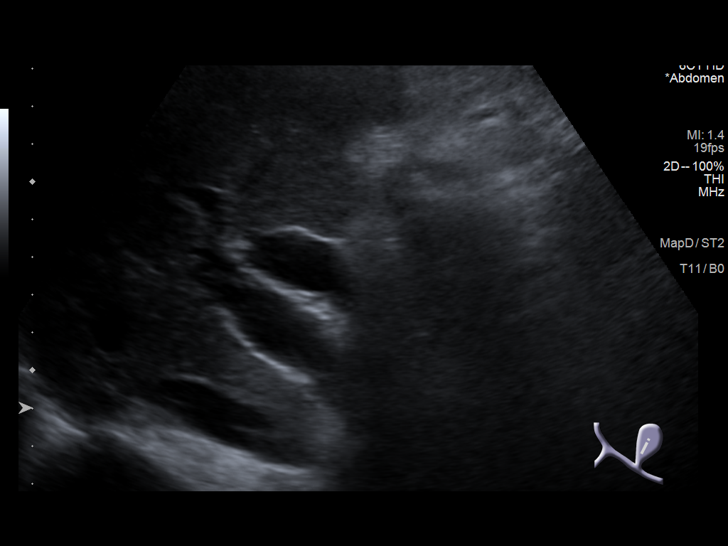
[im 6/72]
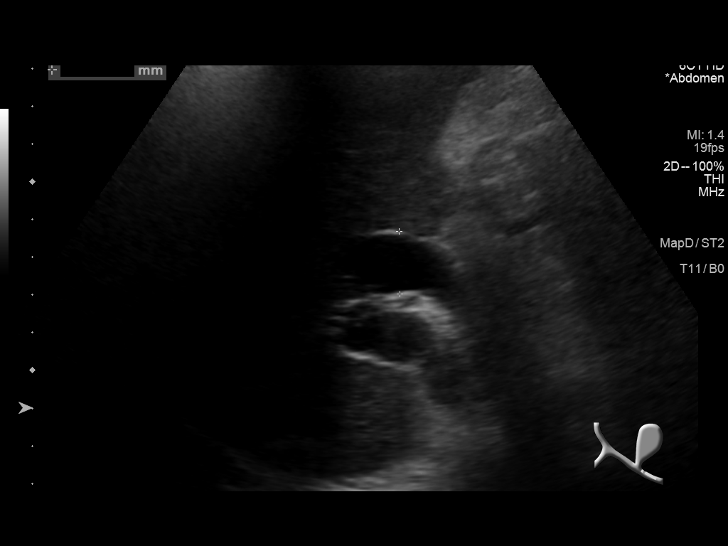
[im 12/72]
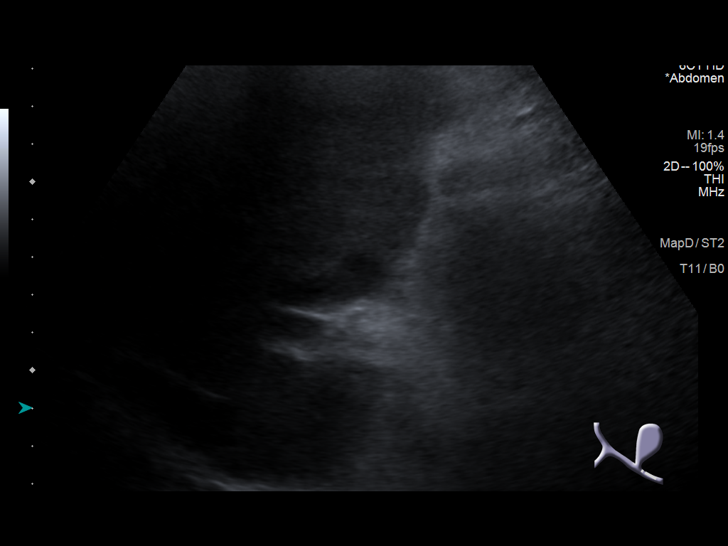
[im 18/72]
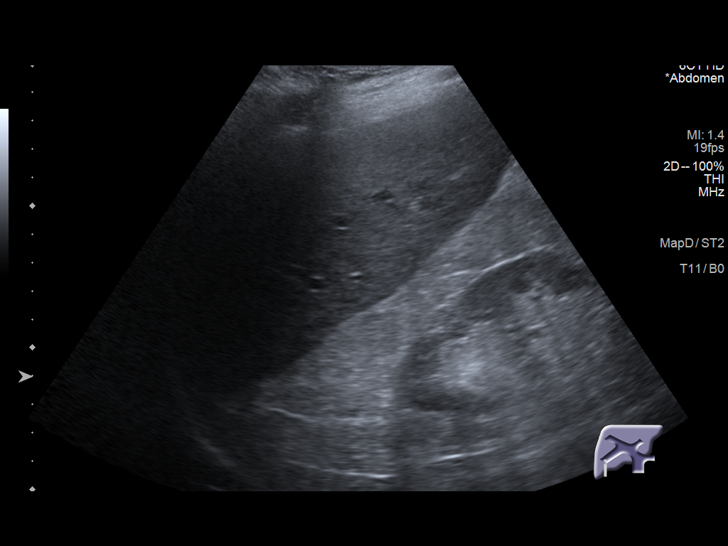
[im 24/72]
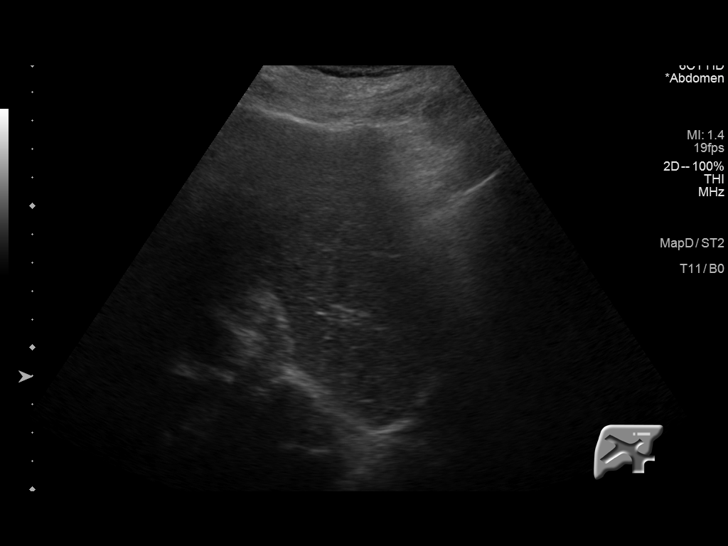
[im 27/72]
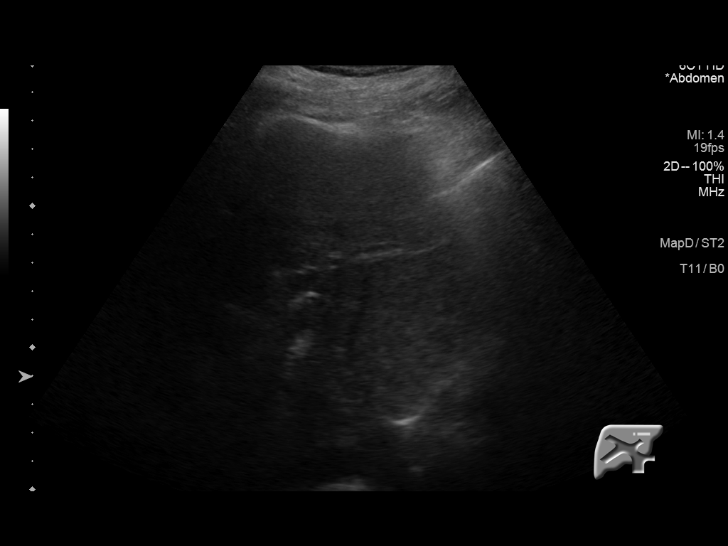
[im 33/72]
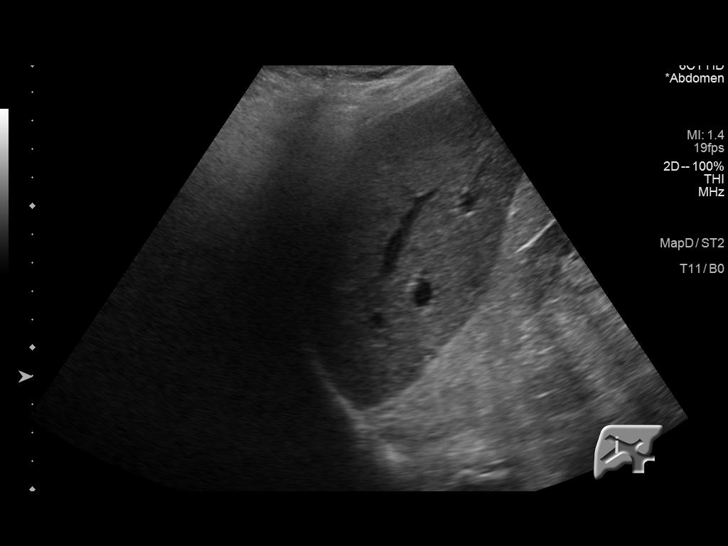
[im 39/72]
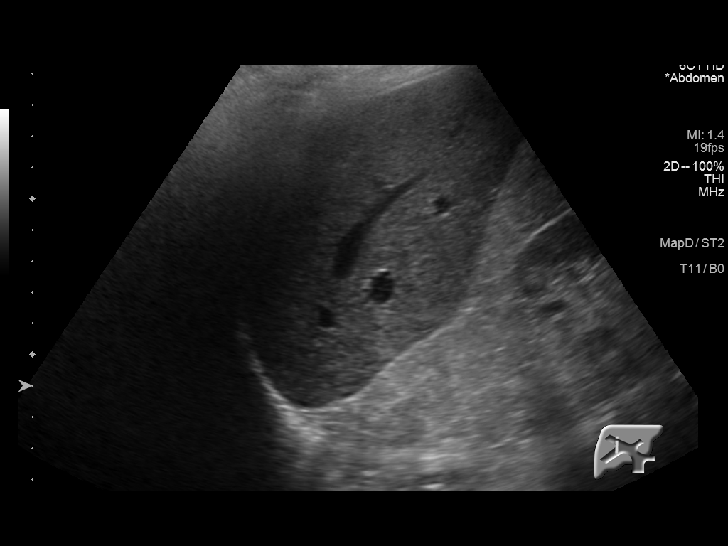
[im 45/72]
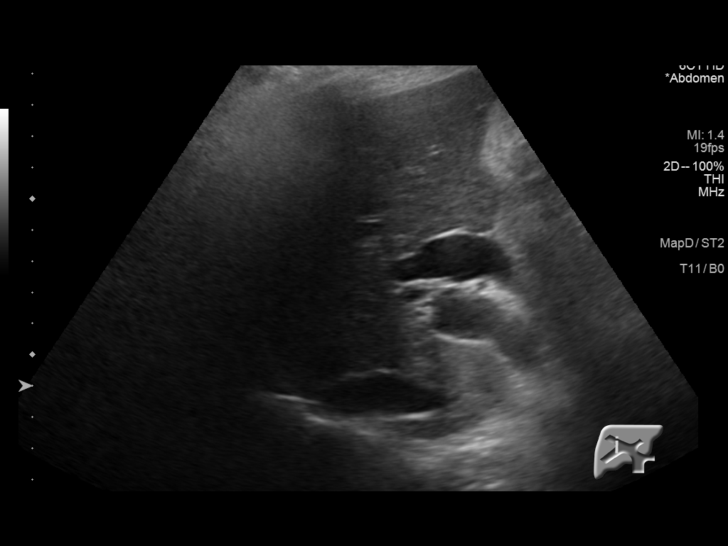
[im 48/72]
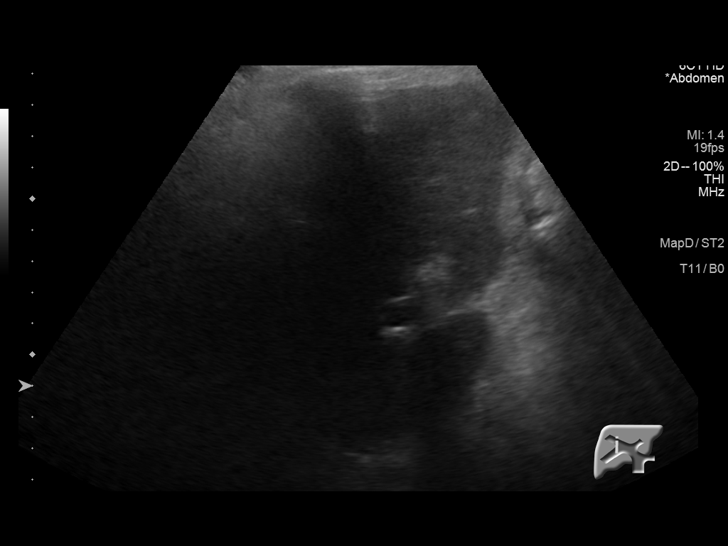
[im 54/72]
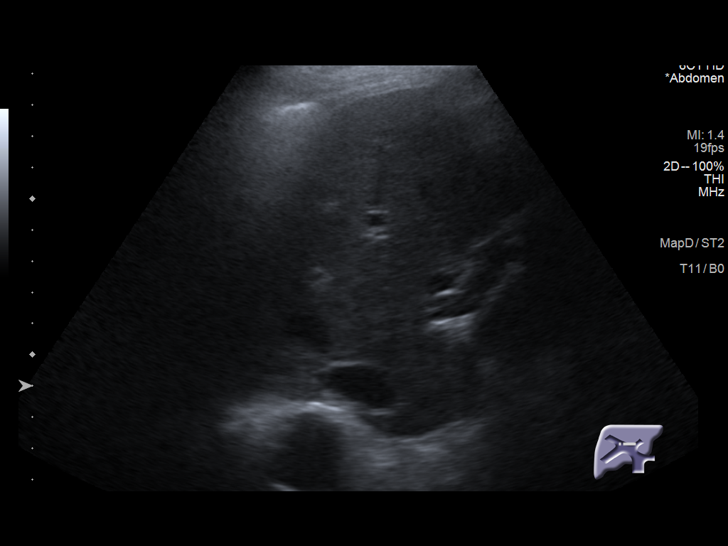
[im 60/72]
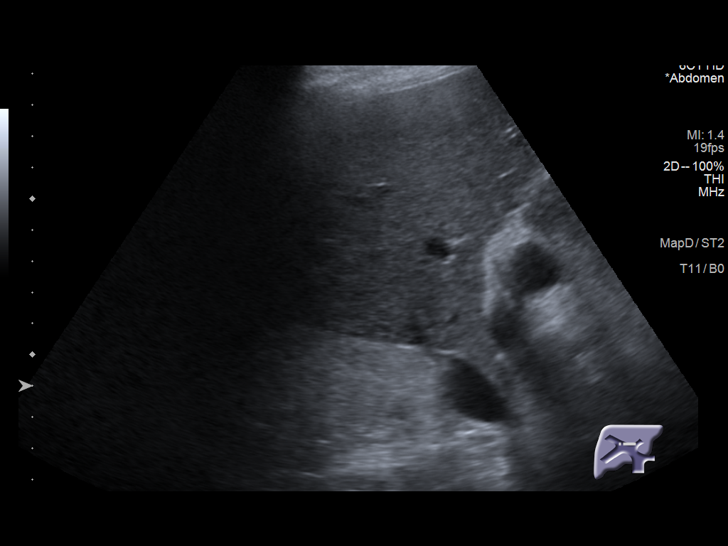
[im 66/72]
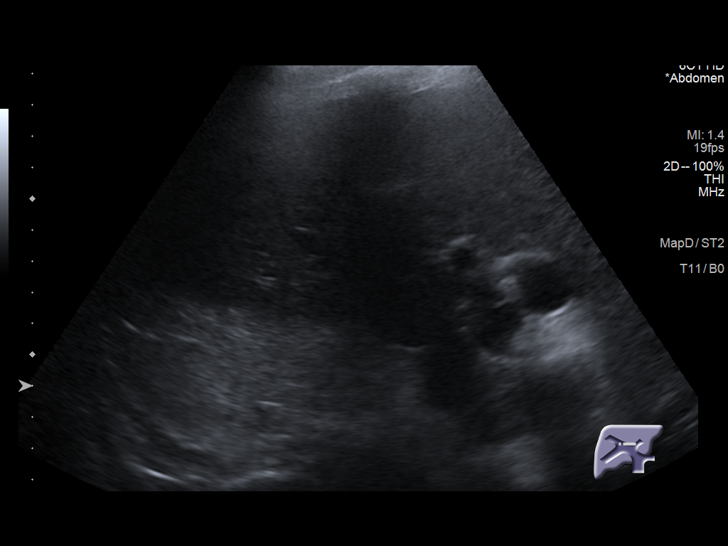
[im 72/72]
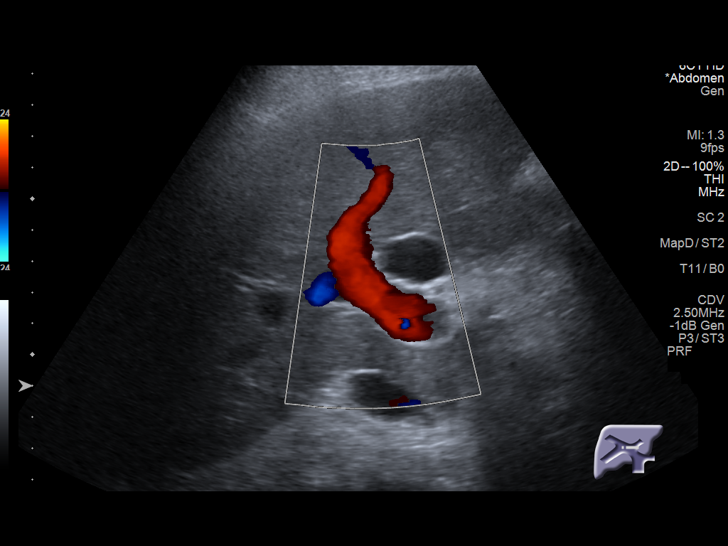

[14 of 25 positions shown; findings below may reference images not displayed]

FINDINGS: Gallbladder:

Surgically absent.

Common bile duct:

Diameter: Common duct in the hepatoduodenal ligament is dilated up
to almost 17 mm. No substantial intrahepatic biliary duct
dilatation.

Liver:

No focal lesion identified. Within normal limits in parenchymal
echogenicity. Portal vein is patent on color Doppler imaging with
normal direction of blood flow towards the liver.

Other: None.
IMPRESSION: Cholecystectomy with common duct dilatation up to 17 mm. MRCP could
be used to further evaluate as clinically warranted.

## 2021-02-26 ENCOUNTER — Telehealth: Payer: Self-pay | Admitting: Family Medicine

## 2021-02-26 DIAGNOSIS — Z743 Need for continuous supervision: Secondary | ICD-10-CM | POA: Diagnosis not present

## 2021-02-26 DIAGNOSIS — R092 Respiratory arrest: Secondary | ICD-10-CM | POA: Diagnosis not present

## 2021-02-26 DIAGNOSIS — R404 Transient alteration of awareness: Secondary | ICD-10-CM | POA: Diagnosis not present

## 2021-03-05 DIAGNOSIS — 419620001 Death: Secondary | SNOMED CT | POA: Diagnosis not present

## 2021-03-05 NOTE — Telephone Encounter (Signed)
Received call from [place] from [facility]. Patient unexpectedly passed away and was officially pronounced deceased today as of 12:35pm. Patient's body will be taken to [facility]. Please advise Sharyn Lull with questions at [phone].

## 2021-03-05 DEATH — deceased

## 2021-03-20 IMAGING — CT CT CERVICAL SPINE W/O CM
3 of 4 series · 11 of 33 positions shown, 13 images · non-contrast
Comparison: 02/16/2019

CLINICAL DATA: Multiple recent falls with headaches and confusion,
initial encounter

EXAM:
CT HEAD WITHOUT CONTRAST
CT CERVICAL SPINE WITHOUT CONTRAST
TECHNIQUE: Multidetector CT imaging of the head and cervical spine was
performed following the standard protocol without intravenous
contrast. Multiplanar CT image reconstructions of the cervical spine
were also generated.

[Series 5: sagittal bone · sagittal · 0.23mm/px · 5 of 61 slices shown, 6 images]
[im 21/61  bone]
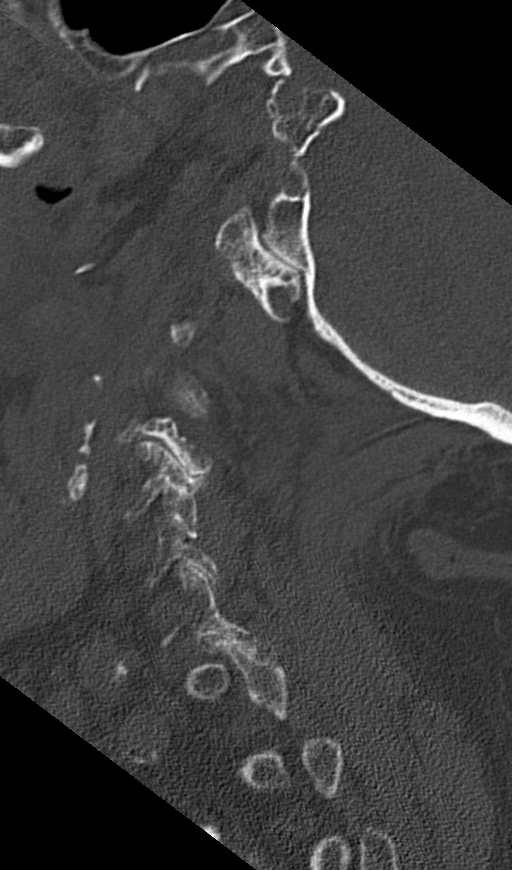
[im 26/61  bone]
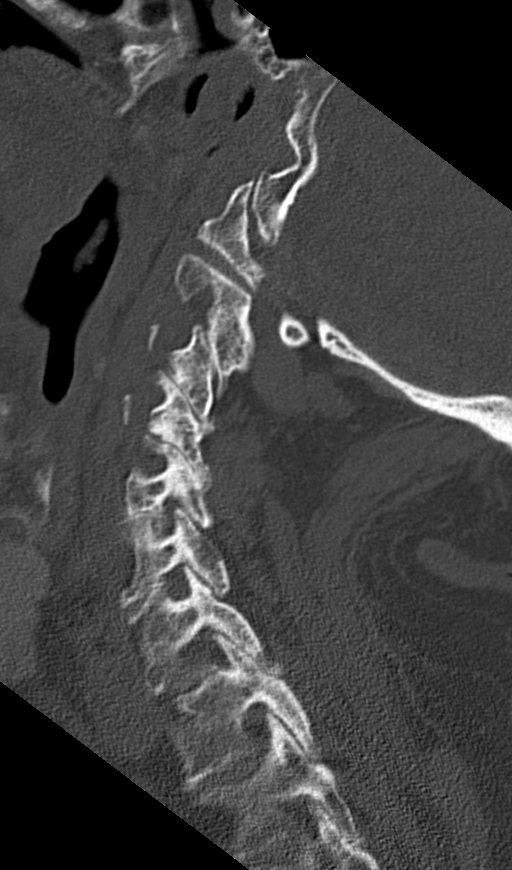
[im 31/61  soft-tissue]
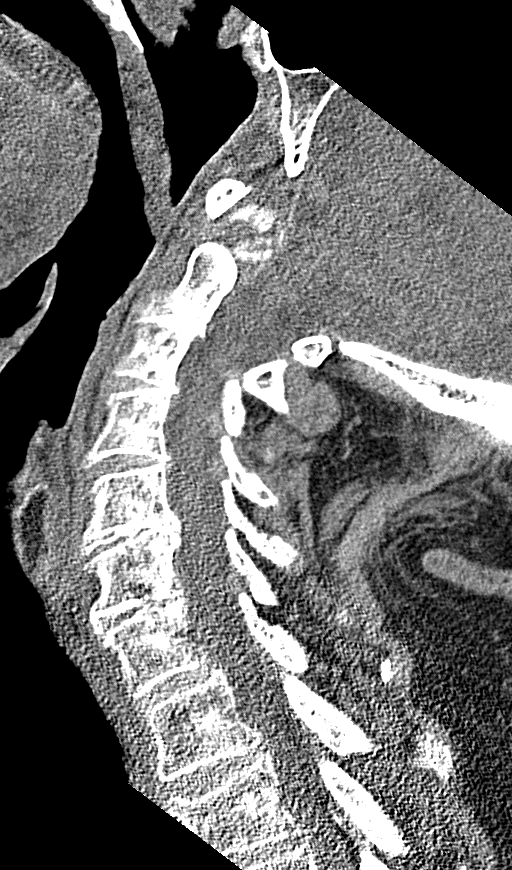
[im 31/61  bone]
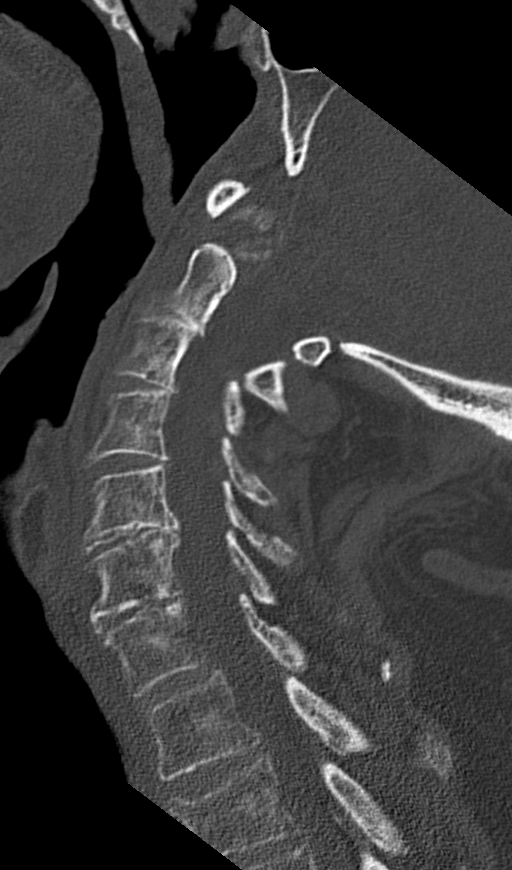
[im 36/61  bone]
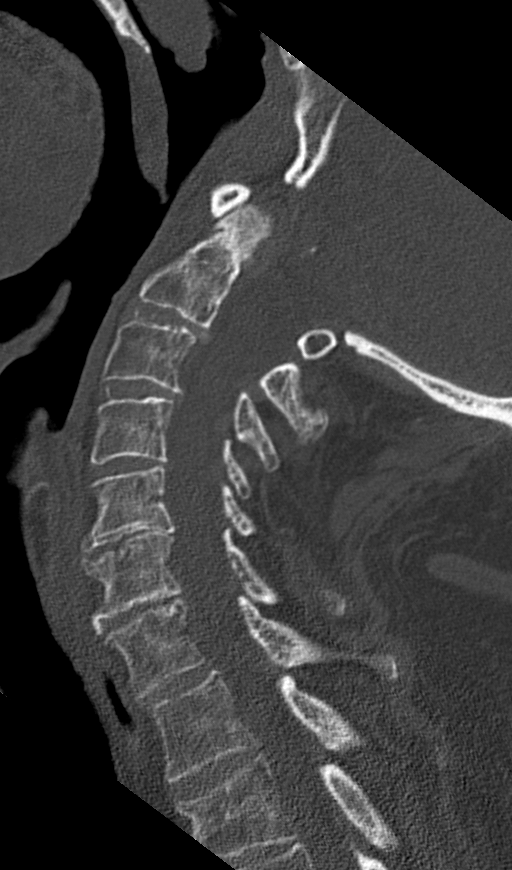
[im 41/61  bone]
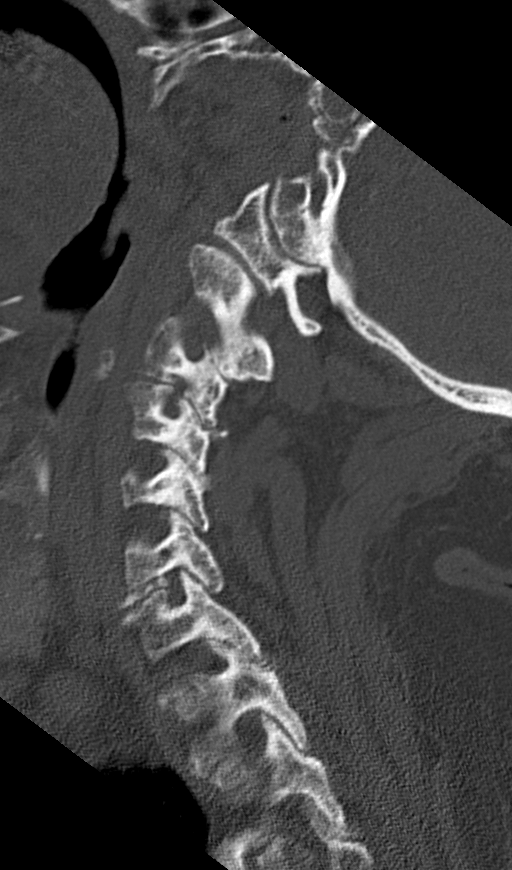

[Series 6: coronal bone · coronal · 0.23mm/px · 3 of 61 slices shown]
[im 18/61  bone]
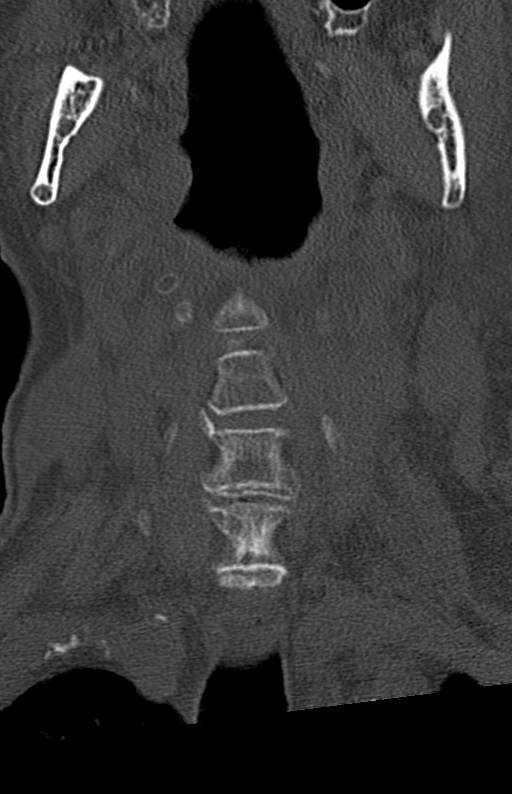
[im 26/61  bone]
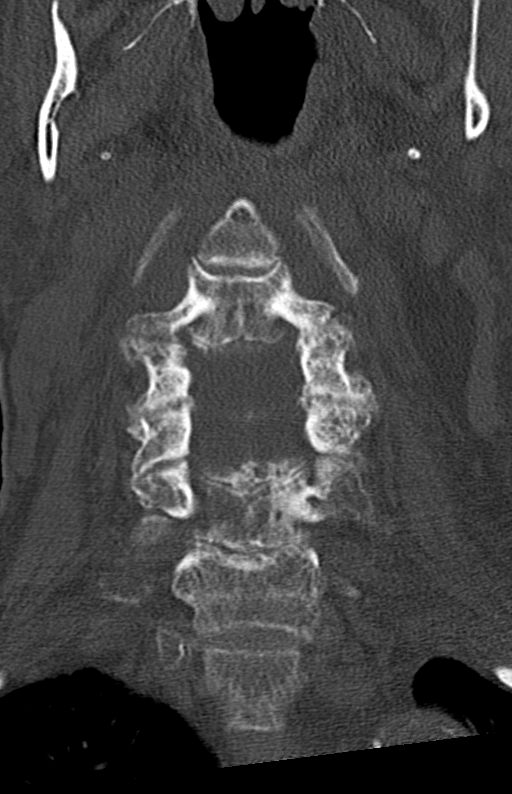
[im 35/61  bone]
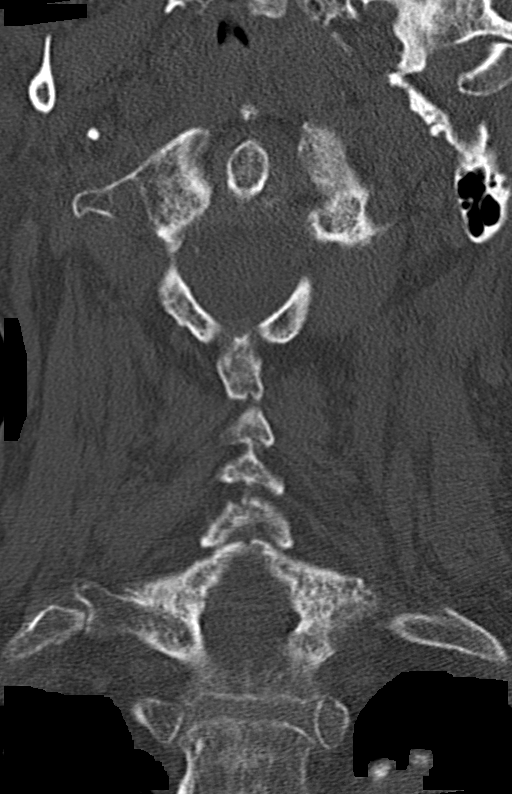

[Series 7: orthogonal axials · axial · 0.21mm/px · z∈[-31,+74]mm · 3 of 73 slices shown, 4 images]
[im 13/73  soft-tissue]
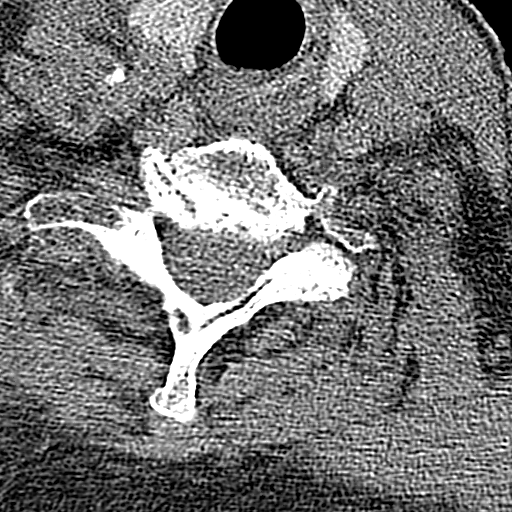
[im 13/73  bone]
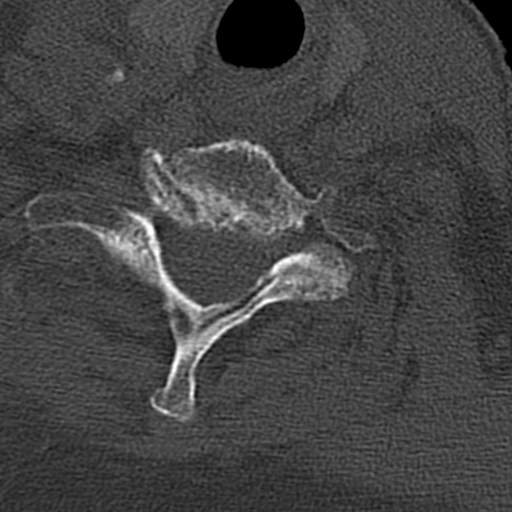
[im 37/73  bone]
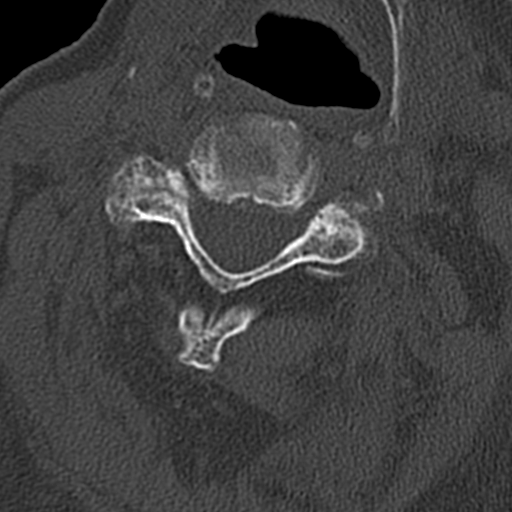
[im 61/73  bone]
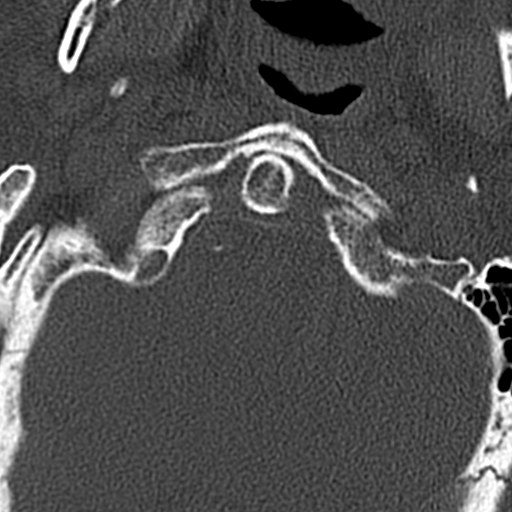

[11 of 33 positions shown; findings below may reference images not displayed]

FINDINGS: CT HEAD FINDINGS

Brain: Chronic atrophic and ischemic changes are again noted and
stable. No findings to suggest acute hemorrhage, acute infarction or
space-occupying mass lesion are seen.

Vascular: No hyperdense vessel or unexpected calcification.

Skull: Normal. Negative for fracture or focal lesion.

Sinuses/Orbits: No acute finding.

Other: None.

CT CERVICAL SPINE FINDINGS

Alignment: Within normal limits.

Skull base and vertebrae: 7 cervical segments are well visualized.
Vertebral body height is well maintained. No acute fracture or acute
facet abnormality is noted. Multilevel facet hypertrophic changes
are seen. Disc space narrowing at C5-6 and C6-7 with associated
osteophytes is noted.

Soft tissues and spinal canal: Diffuse vascular calcifications are
noted. No acute soft tissue abnormality is noted.

Upper chest: Visualized lung apices are unremarkable.

Other: None
IMPRESSION: CT of the head: Chronic atrophic and ischemic changes without acute
abnormality.

CT of the cervical spine: Multilevel degenerative change without
acute abnormality.

## 2021-04-22 ENCOUNTER — Ambulatory Visit: Payer: Medicare Other | Admitting: Urology
